# Patient Record
Sex: Female | Born: 1940 | Race: White | Hispanic: No | Marital: Married | Smoking: Never smoker
Health system: Southern US, Community
[De-identification: ages and names within clinical notes are randomized; demographics above are authoritative.]

## PROBLEM LIST (undated history)

## (undated) DIAGNOSIS — F329 Major depressive disorder, single episode, unspecified: Secondary | ICD-10-CM

## (undated) DIAGNOSIS — N3281 Overactive bladder: Secondary | ICD-10-CM

## (undated) DIAGNOSIS — Z7901 Long term (current) use of anticoagulants: Secondary | ICD-10-CM

## (undated) DIAGNOSIS — I428 Other cardiomyopathies: Secondary | ICD-10-CM

## (undated) DIAGNOSIS — G931 Anoxic brain damage, not elsewhere classified: Secondary | ICD-10-CM

## (undated) DIAGNOSIS — T502X5A Adverse effect of carbonic-anhydrase inhibitors, benzothiadiazides and other diuretics, initial encounter: Secondary | ICD-10-CM

## (undated) DIAGNOSIS — F0280 Dementia in other diseases classified elsewhere without behavioral disturbance: Secondary | ICD-10-CM

## (undated) DIAGNOSIS — N390 Urinary tract infection, site not specified: Secondary | ICD-10-CM

## (undated) DIAGNOSIS — I4901 Ventricular fibrillation: Secondary | ICD-10-CM

## (undated) DIAGNOSIS — E039 Hypothyroidism, unspecified: Secondary | ICD-10-CM

## (undated) DIAGNOSIS — E785 Hyperlipidemia, unspecified: Secondary | ICD-10-CM

## (undated) DIAGNOSIS — I5022 Chronic systolic (congestive) heart failure: Secondary | ICD-10-CM

## (undated) DIAGNOSIS — Z95810 Presence of automatic (implantable) cardiac defibrillator: Secondary | ICD-10-CM

## (undated) DIAGNOSIS — K625 Hemorrhage of anus and rectum: Secondary | ICD-10-CM

## (undated) DIAGNOSIS — I4891 Unspecified atrial fibrillation: Secondary | ICD-10-CM

## (undated) DIAGNOSIS — J309 Allergic rhinitis, unspecified: Secondary | ICD-10-CM

## (undated) DIAGNOSIS — L03115 Cellulitis of right lower limb: Secondary | ICD-10-CM

## (undated) DIAGNOSIS — G459 Transient cerebral ischemic attack, unspecified: Secondary | ICD-10-CM

## (undated) DIAGNOSIS — D62 Acute posthemorrhagic anemia: Secondary | ICD-10-CM

## (undated) DIAGNOSIS — K5900 Constipation, unspecified: Secondary | ICD-10-CM

## (undated) DIAGNOSIS — I340 Nonrheumatic mitral (valve) insufficiency: Secondary | ICD-10-CM

## (undated) DIAGNOSIS — G4700 Insomnia, unspecified: Secondary | ICD-10-CM

## (undated) DIAGNOSIS — N939 Abnormal uterine and vaginal bleeding, unspecified: Secondary | ICD-10-CM

## (undated) DIAGNOSIS — E669 Obesity, unspecified: Secondary | ICD-10-CM

## (undated) DIAGNOSIS — E46 Unspecified protein-calorie malnutrition: Secondary | ICD-10-CM

## (undated) DIAGNOSIS — E876 Hypokalemia: Secondary | ICD-10-CM

## (undated) DIAGNOSIS — I10 Essential (primary) hypertension: Secondary | ICD-10-CM

## (undated) DIAGNOSIS — R5381 Other malaise: Secondary | ICD-10-CM

## (undated) DIAGNOSIS — K220 Achalasia of cardia: Secondary | ICD-10-CM

## (undated) DIAGNOSIS — N184 Chronic kidney disease, stage 4 (severe): Secondary | ICD-10-CM

## (undated) DIAGNOSIS — G3183 Dementia with Lewy bodies: Secondary | ICD-10-CM

## (undated) HISTORY — DX: Hypokalemia: E87.6

## (undated) HISTORY — DX: Achalasia of cardia: K22.0

## (undated) HISTORY — DX: Urinary tract infection, site not specified: N39.0

## (undated) HISTORY — DX: Long term (current) use of anticoagulants: Z79.01

## (undated) HISTORY — DX: Unspecified atrial fibrillation: I48.91

## (undated) HISTORY — DX: Other cardiomyopathies: I42.8

## (undated) HISTORY — DX: Major depressive disorder, single episode, unspecified: F32.9

## (undated) HISTORY — DX: Essential (primary) hypertension: I10

## (undated) HISTORY — DX: Allergic rhinitis, unspecified: J30.9

## (undated) HISTORY — DX: Hyperlipidemia, unspecified: E78.5

## (undated) HISTORY — DX: Obesity, unspecified: E66.9

## (undated) HISTORY — PX: CYSTOSCOPY W/ STONE MANIPULATION: SHX1427

## (undated) HISTORY — DX: Dementia with Lewy bodies: G31.83

## (undated) HISTORY — DX: Abnormal uterine and vaginal bleeding, unspecified: N93.9

## (undated) HISTORY — DX: Other malaise: R53.81

## (undated) HISTORY — DX: Chronic systolic (congestive) heart failure: I50.22

## (undated) HISTORY — DX: Cellulitis of right lower limb: L03.115

## (undated) HISTORY — DX: Acute posthemorrhagic anemia: D62

## (undated) HISTORY — DX: Insomnia, unspecified: G47.00

## (undated) HISTORY — PX: CATARACT EXTRACTION, BILATERAL: SHX1313

## (undated) HISTORY — DX: Nonrheumatic mitral (valve) insufficiency: I34.0

## (undated) HISTORY — DX: Ventricular fibrillation: I49.01

## (undated) HISTORY — PX: LAPAROSCOPIC CHOLECYSTECTOMY: SUR755

## (undated) HISTORY — DX: Anoxic brain damage, not elsewhere classified: G93.1

## (undated) HISTORY — DX: Overactive bladder: N32.81

## (undated) HISTORY — DX: Hemorrhage of anus and rectum: K62.5

## (undated) HISTORY — DX: Dementia in other diseases classified elsewhere, unspecified severity, without behavioral disturbance, psychotic disturbance, mood disturbance, and anxiety: F02.80

## (undated) HISTORY — DX: Hypokalemia: T50.2X5A

## (undated) HISTORY — DX: Unspecified protein-calorie malnutrition: E46

## (undated) HISTORY — DX: Constipation, unspecified: K59.00

---

## 1994-04-25 DIAGNOSIS — G931 Anoxic brain damage, not elsewhere classified: Secondary | ICD-10-CM

## 1994-04-25 HISTORY — DX: Anoxic brain damage, not elsewhere classified: G93.1

## 1998-08-21 ENCOUNTER — Other Ambulatory Visit: Admission: RE | Admit: 1998-08-21 | Discharge: 1998-08-21 | Payer: Self-pay | Admitting: Family Medicine

## 2000-02-21 ENCOUNTER — Other Ambulatory Visit: Admission: RE | Admit: 2000-02-21 | Discharge: 2000-02-21 | Payer: Self-pay | Admitting: Family Medicine

## 2001-08-02 ENCOUNTER — Ambulatory Visit (HOSPITAL_COMMUNITY): Admission: RE | Admit: 2001-08-02 | Discharge: 2001-08-02 | Payer: Self-pay | Admitting: Internal Medicine

## 2001-08-02 HISTORY — PX: OTHER SURGICAL HISTORY: SHX169

## 2003-08-22 ENCOUNTER — Other Ambulatory Visit: Admission: RE | Admit: 2003-08-22 | Discharge: 2003-08-22 | Payer: Self-pay | Admitting: Family Medicine

## 2003-10-29 ENCOUNTER — Inpatient Hospital Stay (HOSPITAL_COMMUNITY): Admission: AD | Admit: 2003-10-29 | Discharge: 2003-10-31 | Payer: Self-pay | Admitting: Internal Medicine

## 2003-10-29 HISTORY — PX: OTHER SURGICAL HISTORY: SHX169

## 2004-02-25 ENCOUNTER — Ambulatory Visit: Payer: Self-pay | Admitting: Internal Medicine

## 2004-03-03 ENCOUNTER — Ambulatory Visit: Payer: Self-pay | Admitting: Internal Medicine

## 2004-03-09 ENCOUNTER — Ambulatory Visit (HOSPITAL_COMMUNITY): Admission: RE | Admit: 2004-03-09 | Discharge: 2004-03-09 | Payer: Self-pay | Admitting: Internal Medicine

## 2004-03-19 ENCOUNTER — Ambulatory Visit: Payer: Self-pay | Admitting: Internal Medicine

## 2004-03-22 ENCOUNTER — Ambulatory Visit: Payer: Self-pay | Admitting: Internal Medicine

## 2004-03-23 ENCOUNTER — Ambulatory Visit: Payer: Self-pay | Admitting: Infectious Diseases

## 2004-04-06 ENCOUNTER — Ambulatory Visit: Payer: Self-pay | Admitting: Infectious Diseases

## 2004-09-17 ENCOUNTER — Inpatient Hospital Stay (HOSPITAL_COMMUNITY): Admission: EM | Admit: 2004-09-17 | Discharge: 2004-09-21 | Payer: Self-pay | Admitting: *Deleted

## 2004-09-20 ENCOUNTER — Ambulatory Visit: Payer: Self-pay | Admitting: Cardiology

## 2004-09-24 ENCOUNTER — Ambulatory Visit: Payer: Self-pay | Admitting: Infectious Diseases

## 2004-09-24 ENCOUNTER — Inpatient Hospital Stay (HOSPITAL_COMMUNITY): Admission: AD | Admit: 2004-09-24 | Discharge: 2004-09-30 | Payer: Self-pay | Admitting: Internal Medicine

## 2004-09-24 HISTORY — PX: OTHER SURGICAL HISTORY: SHX169

## 2004-11-01 ENCOUNTER — Ambulatory Visit: Payer: Self-pay | Admitting: Cardiology

## 2004-11-12 ENCOUNTER — Ambulatory Visit: Payer: Self-pay | Admitting: Infectious Diseases

## 2004-12-01 ENCOUNTER — Ambulatory Visit: Payer: Self-pay | Admitting: Internal Medicine

## 2004-12-10 ENCOUNTER — Ambulatory Visit: Payer: Self-pay | Admitting: Infectious Diseases

## 2004-12-24 ENCOUNTER — Ambulatory Visit: Payer: Self-pay | Admitting: Internal Medicine

## 2004-12-30 ENCOUNTER — Ambulatory Visit: Payer: Self-pay | Admitting: Internal Medicine

## 2004-12-30 ENCOUNTER — Inpatient Hospital Stay (HOSPITAL_COMMUNITY): Admission: AD | Admit: 2004-12-30 | Discharge: 2004-12-30 | Payer: Self-pay | Admitting: Internal Medicine

## 2004-12-30 HISTORY — PX: OTHER SURGICAL HISTORY: SHX169

## 2005-02-09 ENCOUNTER — Ambulatory Visit: Payer: Self-pay | Admitting: Infectious Diseases

## 2005-03-04 ENCOUNTER — Ambulatory Visit: Payer: Self-pay | Admitting: Infectious Diseases

## 2005-04-08 ENCOUNTER — Ambulatory Visit (HOSPITAL_COMMUNITY): Admission: RE | Admit: 2005-04-08 | Discharge: 2005-04-08 | Payer: Self-pay | Admitting: Cardiology

## 2005-04-08 ENCOUNTER — Encounter: Payer: Self-pay | Admitting: Cardiology

## 2005-05-31 ENCOUNTER — Ambulatory Visit: Payer: Self-pay | Admitting: Internal Medicine

## 2006-08-30 ENCOUNTER — Encounter: Payer: Self-pay | Admitting: Vascular Surgery

## 2006-08-30 ENCOUNTER — Ambulatory Visit: Payer: Self-pay | Admitting: Vascular Surgery

## 2006-08-30 ENCOUNTER — Ambulatory Visit (HOSPITAL_COMMUNITY): Admission: RE | Admit: 2006-08-30 | Discharge: 2006-08-30 | Payer: Self-pay | Admitting: Family Medicine

## 2007-06-05 ENCOUNTER — Encounter: Admission: RE | Admit: 2007-06-05 | Discharge: 2007-07-19 | Payer: Self-pay | Admitting: Neurology

## 2007-12-12 ENCOUNTER — Encounter: Admission: RE | Admit: 2007-12-12 | Discharge: 2007-12-12 | Payer: Self-pay | Admitting: Cardiology

## 2008-12-20 DIAGNOSIS — I5023 Acute on chronic systolic (congestive) heart failure: Secondary | ICD-10-CM | POA: Insufficient documentation

## 2008-12-22 DIAGNOSIS — Z95810 Presence of automatic (implantable) cardiac defibrillator: Secondary | ICD-10-CM

## 2009-02-03 ENCOUNTER — Encounter: Admission: RE | Admit: 2009-02-03 | Discharge: 2009-02-03 | Payer: Self-pay | Admitting: Cardiology

## 2009-12-15 ENCOUNTER — Inpatient Hospital Stay (HOSPITAL_COMMUNITY): Admission: EM | Admit: 2009-12-15 | Discharge: 2009-12-22 | Payer: Self-pay | Admitting: Emergency Medicine

## 2009-12-15 ENCOUNTER — Ambulatory Visit: Payer: Self-pay | Admitting: Cardiology

## 2009-12-18 ENCOUNTER — Ambulatory Visit: Payer: Self-pay | Admitting: Vascular Surgery

## 2010-04-14 ENCOUNTER — Encounter (INDEPENDENT_AMBULATORY_CARE_PROVIDER_SITE_OTHER): Payer: Self-pay | Admitting: Cardiology

## 2010-04-14 ENCOUNTER — Inpatient Hospital Stay (HOSPITAL_COMMUNITY)
Admission: EM | Admit: 2010-04-14 | Discharge: 2010-04-17 | Payer: Self-pay | Source: Home / Self Care | Attending: Cardiology | Admitting: Cardiology

## 2010-04-14 HISTORY — PX: CARDIOVERSION: SHX1299

## 2010-04-15 ENCOUNTER — Encounter: Payer: Self-pay | Admitting: Internal Medicine

## 2010-05-10 ENCOUNTER — Ambulatory Visit
Admission: RE | Admit: 2010-05-10 | Discharge: 2010-05-10 | Payer: Self-pay | Source: Home / Self Care | Attending: Thoracic Surgery (Cardiothoracic Vascular Surgery) | Admitting: Thoracic Surgery (Cardiothoracic Vascular Surgery)

## 2010-06-08 ENCOUNTER — Ambulatory Visit
Admission: RE | Admit: 2010-06-08 | Discharge: 2010-06-08 | Disposition: A | Discharge status: Home / Self Care | Payer: Medicare Other | Source: Ambulatory Visit | Attending: Cardiology | Admitting: Cardiology

## 2010-06-08 ENCOUNTER — Other Ambulatory Visit: Payer: Self-pay | Admitting: Cardiology

## 2010-06-08 DIAGNOSIS — I4891 Unspecified atrial fibrillation: Secondary | ICD-10-CM

## 2010-06-10 NOTE — Consult Note (Signed)
Summary: [facility]   [facility]   Imported By: Roderic Ovens 05/27/2010 15:12:54  _____________________________________________________________________  External Attachment:    Type:   Image     Comment:   External Document

## 2010-06-14 ENCOUNTER — Inpatient Hospital Stay (HOSPITAL_BASED_OUTPATIENT_CLINIC_OR_DEPARTMENT_OTHER)
Admission: RE | Admit: 2010-06-14 | Discharge: 2010-06-14 | Disposition: A | Discharge status: Home / Self Care | Payer: Medicare Other | Source: Ambulatory Visit | Attending: Cardiology | Admitting: Cardiology

## 2010-06-14 DIAGNOSIS — I4891 Unspecified atrial fibrillation: Secondary | ICD-10-CM | POA: Insufficient documentation

## 2010-06-14 DIAGNOSIS — I2789 Other specified pulmonary heart diseases: Secondary | ICD-10-CM | POA: Insufficient documentation

## 2010-06-14 DIAGNOSIS — I428 Other cardiomyopathies: Secondary | ICD-10-CM | POA: Insufficient documentation

## 2010-06-14 DIAGNOSIS — I059 Rheumatic mitral valve disease, unspecified: Secondary | ICD-10-CM | POA: Insufficient documentation

## 2010-06-14 DIAGNOSIS — I509 Heart failure, unspecified: Secondary | ICD-10-CM | POA: Insufficient documentation

## 2010-06-14 LAB — POCT I-STAT 3, VENOUS BLOOD GAS (G3P V)
pCO2, Ven: 47.6 mmHg (ref 45.0–50.0)
pH, Ven: 7.349 — ABNORMAL HIGH (ref 7.250–7.300)
pO2, Ven: 37 mmHg (ref 30.0–45.0)

## 2010-06-14 LAB — POCT I-STAT 3, ART BLOOD GAS (G3+)
O2 Saturation: 96 %
TCO2: 30 mmol/L (ref 0–100)
pCO2 arterial: 43.2 mmHg (ref 35.0–45.0)
pO2, Arterial: 79 mmHg — ABNORMAL LOW (ref 80.0–100.0)

## 2010-06-28 ENCOUNTER — Encounter (INDEPENDENT_AMBULATORY_CARE_PROVIDER_SITE_OTHER): Payer: Medicare Other | Admitting: Thoracic Surgery (Cardiothoracic Vascular Surgery)

## 2010-06-28 DIAGNOSIS — I059 Rheumatic mitral valve disease, unspecified: Secondary | ICD-10-CM

## 2010-06-28 DIAGNOSIS — I4891 Unspecified atrial fibrillation: Secondary | ICD-10-CM

## 2010-06-29 ENCOUNTER — Emergency Department (HOSPITAL_COMMUNITY): Payer: Medicare Other

## 2010-06-29 ENCOUNTER — Emergency Department (HOSPITAL_COMMUNITY)
Admission: EM | Admit: 2010-06-29 | Discharge: 2010-06-29 | Disposition: A | Discharge status: Other Acute Inpatient Hospital | Payer: Medicare Other | Source: Home / Self Care | Attending: Emergency Medicine | Admitting: Emergency Medicine

## 2010-06-29 ENCOUNTER — Inpatient Hospital Stay (HOSPITAL_COMMUNITY)
Admission: AD | Admit: 2010-06-29 | Discharge: 2010-07-23 | DRG: 220 | Disposition: A | Discharge status: Rehabilitation Facility | Payer: Medicare Other | Source: Other Acute Inpatient Hospital | Attending: Thoracic Surgery (Cardiothoracic Vascular Surgery) | Admitting: Thoracic Surgery (Cardiothoracic Vascular Surgery)

## 2010-06-29 DIAGNOSIS — J309 Allergic rhinitis, unspecified: Secondary | ICD-10-CM | POA: Diagnosis present

## 2010-06-29 DIAGNOSIS — D6489 Other specified anemias: Secondary | ICD-10-CM | POA: Diagnosis not present

## 2010-06-29 DIAGNOSIS — R55 Syncope and collapse: Secondary | ICD-10-CM | POA: Insufficient documentation

## 2010-06-29 DIAGNOSIS — N390 Urinary tract infection, site not specified: Secondary | ICD-10-CM | POA: Diagnosis present

## 2010-06-29 DIAGNOSIS — N179 Acute kidney failure, unspecified: Secondary | ICD-10-CM | POA: Diagnosis not present

## 2010-06-29 DIAGNOSIS — I129 Hypertensive chronic kidney disease with stage 1 through stage 4 chronic kidney disease, or unspecified chronic kidney disease: Secondary | ICD-10-CM | POA: Diagnosis present

## 2010-06-29 DIAGNOSIS — D696 Thrombocytopenia, unspecified: Secondary | ICD-10-CM | POA: Diagnosis not present

## 2010-06-29 DIAGNOSIS — R5381 Other malaise: Secondary | ICD-10-CM | POA: Diagnosis not present

## 2010-06-29 DIAGNOSIS — N183 Chronic kidney disease, stage 3 unspecified: Secondary | ICD-10-CM | POA: Diagnosis present

## 2010-06-29 DIAGNOSIS — I5022 Chronic systolic (congestive) heart failure: Secondary | ICD-10-CM | POA: Diagnosis present

## 2010-06-29 DIAGNOSIS — E669 Obesity, unspecified: Secondary | ICD-10-CM | POA: Diagnosis present

## 2010-06-29 DIAGNOSIS — I509 Heart failure, unspecified: Secondary | ICD-10-CM | POA: Diagnosis present

## 2010-06-29 DIAGNOSIS — Z7901 Long term (current) use of anticoagulants: Secondary | ICD-10-CM | POA: Insufficient documentation

## 2010-06-29 DIAGNOSIS — Z8674 Personal history of sudden cardiac arrest: Secondary | ICD-10-CM

## 2010-06-29 DIAGNOSIS — I10 Essential (primary) hypertension: Secondary | ICD-10-CM | POA: Insufficient documentation

## 2010-06-29 DIAGNOSIS — D689 Coagulation defect, unspecified: Secondary | ICD-10-CM | POA: Diagnosis not present

## 2010-06-29 DIAGNOSIS — I059 Rheumatic mitral valve disease, unspecified: Principal | ICD-10-CM | POA: Diagnosis present

## 2010-06-29 DIAGNOSIS — A498 Other bacterial infections of unspecified site: Secondary | ICD-10-CM | POA: Diagnosis present

## 2010-06-29 DIAGNOSIS — I82B29 Chronic embolism and thrombosis of unspecified subclavian vein: Secondary | ICD-10-CM | POA: Diagnosis present

## 2010-06-29 DIAGNOSIS — E785 Hyperlipidemia, unspecified: Secondary | ICD-10-CM | POA: Diagnosis present

## 2010-06-29 DIAGNOSIS — I4891 Unspecified atrial fibrillation: Secondary | ICD-10-CM | POA: Diagnosis present

## 2010-06-29 DIAGNOSIS — I428 Other cardiomyopathies: Secondary | ICD-10-CM | POA: Diagnosis present

## 2010-06-29 DIAGNOSIS — N3946 Mixed incontinence: Secondary | ICD-10-CM | POA: Diagnosis present

## 2010-06-29 DIAGNOSIS — I498 Other specified cardiac arrhythmias: Secondary | ICD-10-CM | POA: Diagnosis present

## 2010-06-29 LAB — COMPREHENSIVE METABOLIC PANEL
ALT: 12 U/L (ref 0–35)
Alkaline Phosphatase: 75 U/L (ref 39–117)
CO2: 27 mEq/L (ref 19–32)
Calcium: 9.3 mg/dL (ref 8.4–10.5)
Chloride: 98 mEq/L (ref 96–112)
GFR calc non Af Amer: 37 mL/min — ABNORMAL LOW (ref >60)
Glucose, Bld: 104 mg/dL — ABNORMAL HIGH (ref 70–99)
Sodium: 135 mEq/L (ref 135–145)
Total Bilirubin: 1.1 mg/dL (ref 0.3–1.2)

## 2010-06-29 LAB — DIFFERENTIAL
Basophils Relative: 1 % (ref 0–1)
Eosinophils Absolute: 0.1 10*3/uL (ref 0.0–0.7)
Monocytes Absolute: 0.8 10*3/uL (ref 0.1–1.0)
Monocytes Relative: 11 % (ref 3–12)
Neutro Abs: 4.8 10*3/uL (ref 1.7–7.7)

## 2010-06-29 LAB — TROPONIN I: Troponin I: 0.01 ng/mL (ref 0.00–0.06)

## 2010-06-29 LAB — CBC
Hemoglobin: 11.3 g/dL — ABNORMAL LOW (ref 12.0–15.0)
MCH: 29.4 pg (ref 26.0–34.0)
MCHC: 31.8 g/dL (ref 30.0–36.0)

## 2010-06-29 LAB — URINALYSIS, ROUTINE W REFLEX MICROSCOPIC
Bilirubin Urine: NEGATIVE
Ketones, ur: NEGATIVE mg/dL
Nitrite: NEGATIVE
Protein, ur: NEGATIVE mg/dL
Specific Gravity, Urine: 1.012 (ref 1.005–1.030)
Urobilinogen, UA: 0.2 mg/dL (ref 0.0–1.0)

## 2010-06-29 LAB — CK TOTAL AND CKMB (NOT AT ARMC): Relative Index: INVALID (ref 0.0–2.5)

## 2010-06-29 LAB — BRAIN NATRIURETIC PEPTIDE: Pro B Natriuretic peptide (BNP): 365 pg/mL — ABNORMAL HIGH (ref 0.0–100.0)

## 2010-06-29 LAB — PROTIME-INR
INR: 1.92 — ABNORMAL HIGH (ref 0.00–1.49)
Prothrombin Time: 22.1 seconds — ABNORMAL HIGH (ref 11.6–15.2)

## 2010-06-29 NOTE — Assessment & Plan Note (Signed)
OFFICE VISIT  MASHA, ORBACH DOB:  [birth year]                                        June 28, 2010 CHART #:  [id number]  HISTORY OF PRESENT ILLNESS:  This parental returns for further followup of mitral regurgitation.  She was originally seen in consultation on April 15, 2010, at which time, she was hospitalized at [facility].  A full consultation note was dictated at that time. She was last seen here in the office May 10, 2010.  Since then, she has again developed symptoms of worsening heart failure.  She was seen in followup by Dr. Donnie Aho and underwent left and right heart catheterization on June 15, 2010.  This revealed no sign of significant coronary artery disease.  There was severe mitral regurgitation.  There was severe left ventricular dysfunction with ejection fraction estimated 30-35% with global hypokinesis.  Pulmonary artery pressures measured 34/11 with pulmonary capillary wedge pressure 15.  There were V-waves consistent with severe mitral regurgitation. Central venous pressure was 5.  The patient has been seen back in followup by Dr. Donnie Aho on February 24.  She again seems stable overall, but she has been hospitalized on two occasions with rapid atrial fibrillation and worsening heart failure.  They return to discuss possibility of elective mitral valve repair and a Maze procedure.  REVIEW OF SYSTEMS:  Unchanged from previously.  Of note is the fact that the patient has limited physical mobility.  Her husband states that she is afraid to get up and walk.  Since her last hospital discharge, she has not had any further episodes of resting shortness of breath.  She has not had any further rapid atrial fibrillation or tachy palpitations. She has never had any chest pain.  PHYSICAL EXAMINATION:  General:  Notable for an obese female who appears of stated age in no acute distress.  Vital Signs:  Blood  pressure 161/83, pulse 66 and regular, oxygen saturation 98% on room air.  Chest: Auscultation of the chest reveals clear breath sounds which are symmetrical bilaterally.  No wheezes, rales, or rhonchi are noted. Cardiovascular:  Regular rate and rhythm.  There is a grade 3/6 systolic murmur heard best at the apex.  No diastolic murmurs are noted. Abdomen:  Obese but soft, nontender.  There is no obvious ascites. Extremities:  Warm and adequately perfused.  There is mild bilateral lower extremity edema.  Rectal and GU exams are both deferred.  The remainder of her physical exam is noncontributory.  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  The jet of regurgitation is eccentric and may be primarily related to the prolapse (type II dysfunction).  She has stable but severe left ventricular dysfunction with known dilated nonischemic cardiomyopathy.  She has remote history of out of hospital cardiac arrest more than 20 years ago at which time, she suffered a significant anoxic brain injury.  She has been stable since that time. 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  She still has retained lead in place from her previous defibrillators.  She has never had any sustained irregular heart rhythms other than her out of hospital arrest back in the 90s. She has very limited physical mobility and this is particularly worrisome.  This is stable and chronic but according to her husband, it may be getting somewhat worse.  PLAN:  I have discussed matters  at length with the patient and her husband.  The rationale for surgical intervention has been compared and contrasted with continued conservative therapy.  On the one hand, surgery will be fairly high-risk.  On the other hand, if her condition were to deteriorate any further, she would clearly not be a candidate for intervention of any type.  All their questions have been addressed. They seem interested in proceeding with surgery.  I think it would be helpful to find out exactly what her physical limitations are to anticipate how this might impact her recovery even in the absence of any significant surgical complications.  We may need to get CT angiogram of the chest and further evaluate her venous anatomy and confirm whether or not her innominate vein is chronically occluded and/or what other venous drainage channels exists from the upper extremities.  Finally, we will need to discuss whether or not placement of epicardial patches for a defibrillator would be indicated or appropriate at time of surgery. This would obviously require a median sternotomy.  We will send the patient for a formal physical therapy consul this week to establish what her physical limitations are at this point in time.  We will plan to see her next week and make further plans as appropriate.  Salvatore Decent. Cornelius Moras, M.D. Electronically Signed  CHO/MEDQ  D:  06/28/2010  T:  06/29/2010  Job:  [id number]  cc:   Georga Hacking, M.D. Dario Guardian, M.D.

## 2010-06-30 ENCOUNTER — Inpatient Hospital Stay (HOSPITAL_COMMUNITY): Payer: Medicare Other

## 2010-06-30 DIAGNOSIS — I059 Rheumatic mitral valve disease, unspecified: Secondary | ICD-10-CM

## 2010-06-30 LAB — PROTIME-INR
INR: 2.03 — ABNORMAL HIGH (ref 0.00–1.49)
Prothrombin Time: 23.1 seconds — ABNORMAL HIGH (ref 11.6–15.2)

## 2010-07-01 ENCOUNTER — Inpatient Hospital Stay (HOSPITAL_COMMUNITY): Payer: Medicare Other

## 2010-07-01 DIAGNOSIS — I2510 Atherosclerotic heart disease of native coronary artery without angina pectoris: Secondary | ICD-10-CM

## 2010-07-01 DIAGNOSIS — Z01810 Encounter for preprocedural cardiovascular examination: Secondary | ICD-10-CM

## 2010-07-01 DIAGNOSIS — I059 Rheumatic mitral valve disease, unspecified: Secondary | ICD-10-CM

## 2010-07-01 DIAGNOSIS — R55 Syncope and collapse: Secondary | ICD-10-CM

## 2010-07-01 LAB — URINE CULTURE

## 2010-07-01 LAB — PROTIME-INR: Prothrombin Time: 27.5 seconds — ABNORMAL HIGH (ref 11.6–15.2)

## 2010-07-01 MED ORDER — IOHEXOL 300 MG/ML  SOLN
80.0000 mL | Freq: Once | INTRAMUSCULAR | Status: AC | PRN
  Administered 2010-07-01: 80 mL via INTRAVENOUS

## 2010-07-02 DIAGNOSIS — I059 Rheumatic mitral valve disease, unspecified: Secondary | ICD-10-CM

## 2010-07-02 LAB — PROTIME-INR
INR: 2.52 — ABNORMAL HIGH (ref 0.00–1.49)
Prothrombin Time: 27.3 seconds — ABNORMAL HIGH (ref 11.6–15.2)

## 2010-07-03 DIAGNOSIS — I498 Other specified cardiac arrhythmias: Secondary | ICD-10-CM

## 2010-07-03 LAB — BASIC METABOLIC PANEL
CO2: 28 mEq/L (ref 19–32)
Calcium: 9.3 mg/dL (ref 8.4–10.5)
Chloride: 102 mEq/L (ref 96–112)
Creatinine, Ser: 1.73 mg/dL — ABNORMAL HIGH (ref 0.4–1.2)
Glucose, Bld: 98 mg/dL (ref 70–99)
Sodium: 139 mEq/L (ref 135–145)

## 2010-07-04 LAB — BASIC METABOLIC PANEL
CO2: 27 mEq/L (ref 19–32)
Calcium: 9.1 mg/dL (ref 8.4–10.5)
Chloride: 103 mEq/L (ref 96–112)
GFR calc Af Amer: 32 mL/min — ABNORMAL LOW (ref >60)
Glucose, Bld: 90 mg/dL (ref 70–99)
Sodium: 139 mEq/L (ref 135–145)

## 2010-07-04 LAB — CBC
HCT: 35.1 % — ABNORMAL LOW (ref 36.0–46.0)
Hemoglobin: 11.1 g/dL — ABNORMAL LOW (ref 12.0–15.0)
MCH: 29 pg (ref 26.0–34.0)
MCHC: 31.6 g/dL (ref 30.0–36.0)
MCV: 91.6 fL (ref 78.0–100.0)
Platelets: 213 10*3/uL (ref 150–400)
RBC: 3.83 MIL/uL — ABNORMAL LOW (ref 3.87–5.11)
RDW: 15.3 % (ref 11.5–15.5)
WBC: 5.1 10*3/uL (ref 4.0–10.5)

## 2010-07-05 ENCOUNTER — Encounter: Payer: Medicare Other | Admitting: Thoracic Surgery (Cardiothoracic Vascular Surgery)

## 2010-07-05 LAB — BASIC METABOLIC PANEL
BUN: 31 mg/dL — ABNORMAL HIGH (ref 6–23)
BUN: 25 mg/dL — ABNORMAL HIGH (ref 6–23)
BUN: 32 mg/dL — ABNORMAL HIGH (ref 6–23)
CO2: 22 mEq/L (ref 19–32)
CO2: 23 mEq/L (ref 19–32)
CO2: 27 mEq/L (ref 19–32)
Calcium: 8.9 mg/dL (ref 8.4–10.5)
Chloride: 106 mEq/L (ref 96–112)
Chloride: 107 mEq/L (ref 96–112)
Chloride: 112 mEq/L (ref 96–112)
Creatinine, Ser: 1.5 mg/dL — ABNORMAL HIGH (ref 0.4–1.2)
Creatinine, Ser: 1.35 mg/dL — ABNORMAL HIGH (ref 0.4–1.2)
Creatinine, Ser: 1.58 mg/dL — ABNORMAL HIGH (ref 0.4–1.2)
GFR calc Af Amer: 47 mL/min — ABNORMAL LOW (ref >60)
GFR calc non Af Amer: 34 mL/min — ABNORMAL LOW (ref >60)
Glucose, Bld: 88 mg/dL (ref 70–99)
Glucose, Bld: 105 mg/dL — ABNORMAL HIGH (ref 70–99)
Potassium: 4.7 mEq/L (ref 3.5–5.1)
Potassium: 3.7 mEq/L (ref 3.5–5.1)

## 2010-07-05 LAB — DIFFERENTIAL
Eosinophils Absolute: 0.1 10*3/uL (ref 0.0–0.7)
Eosinophils Relative: 1 % (ref 0–5)
Lymphs Abs: 1.6 10*3/uL (ref 0.7–4.0)
Monocytes Relative: 5 % (ref 3–12)

## 2010-07-05 LAB — CBC
HCT: 30.6 % — ABNORMAL LOW (ref 36.0–46.0)
MCH: 28.4 pg (ref 26.0–34.0)
MCH: 28.7 pg (ref 26.0–34.0)
MCH: 28.7 pg (ref 26.0–34.0)
MCH: 29 pg (ref 26.0–34.0)
MCHC: 31.5 g/dL (ref 30.0–36.0)
MCHC: 31.7 g/dL (ref 30.0–36.0)
MCV: 89.7 fL (ref 78.0–100.0)
MCV: 91.2 fL (ref 78.0–100.0)
MCV: 91.9 fL (ref 78.0–100.0)
Platelets: 189 10*3/uL (ref 150–400)
Platelets: 208 10*3/uL (ref 150–400)
Platelets: 257 10*3/uL (ref 150–400)
RBC: 3.28 MIL/uL — ABNORMAL LOW (ref 3.87–5.11)
RDW: 13.8 % (ref 11.5–15.5)
RDW: 13.9 % (ref 11.5–15.5)
WBC: 12.1 10*3/uL — ABNORMAL HIGH (ref 4.0–10.5)

## 2010-07-05 LAB — URINALYSIS, ROUTINE W REFLEX MICROSCOPIC
Bilirubin Urine: NEGATIVE
Glucose, UA: NEGATIVE mg/dL
Nitrite: POSITIVE — AB
Specific Gravity, Urine: 1.022 (ref 1.005–1.030)
pH: 5 (ref 5.0–8.0)

## 2010-07-05 LAB — APTT: aPTT: 50 seconds — ABNORMAL HIGH (ref 24–37)

## 2010-07-05 LAB — MRSA PCR SCREENING: MRSA by PCR: NEGATIVE

## 2010-07-05 LAB — URINE CULTURE: Culture  Setup Time: 201112221527

## 2010-07-05 LAB — URINE MICROSCOPIC-ADD ON

## 2010-07-05 LAB — HEPARIN LEVEL (UNFRACTIONATED)
Heparin Unfractionated: 0.35 IU/mL (ref 0.30–0.70)
Heparin Unfractionated: 0.39 IU/mL (ref 0.30–0.70)

## 2010-07-05 LAB — PROTIME-INR: INR: 2.38 — ABNORMAL HIGH (ref 0.00–1.49)

## 2010-07-06 DIAGNOSIS — I059 Rheumatic mitral valve disease, unspecified: Secondary | ICD-10-CM

## 2010-07-06 LAB — BASIC METABOLIC PANEL
BUN: 23 mg/dL (ref 6–23)
Chloride: 105 mEq/L (ref 96–112)
Glucose, Bld: 87 mg/dL (ref 70–99)
Potassium: 4.5 mEq/L (ref 3.5–5.1)

## 2010-07-06 LAB — PROTIME-INR: INR: 1.85 — ABNORMAL HIGH (ref 0.00–1.49)

## 2010-07-07 LAB — PROTIME-INR: Prothrombin Time: 19.5 seconds — ABNORMAL HIGH (ref 11.6–15.2)

## 2010-07-08 ENCOUNTER — Inpatient Hospital Stay (HOSPITAL_COMMUNITY): Payer: Medicare Other

## 2010-07-08 LAB — TYPE AND SCREEN
ABO/RH(D): A POS
Antibody Screen: NEGATIVE

## 2010-07-08 LAB — CBC
HCT: 22.1 % — ABNORMAL LOW (ref 36.0–46.0)
HCT: 22.3 % — ABNORMAL LOW (ref 36.0–46.0)
HCT: 24.3 % — ABNORMAL LOW (ref 36.0–46.0)
HCT: 28.1 % — ABNORMAL LOW (ref 36.0–46.0)
HCT: 31 % — ABNORMAL LOW (ref 36.0–46.0)
HCT: 33.5 % — ABNORMAL LOW (ref 36.0–46.0)
HCT: 35.8 % — ABNORMAL LOW (ref 36.0–46.0)
Hemoglobin: 10.5 g/dL — ABNORMAL LOW (ref 12.0–15.0)
Hemoglobin: 11 g/dL — ABNORMAL LOW (ref 12.0–15.0)
Hemoglobin: 11.4 g/dL — ABNORMAL LOW (ref 12.0–15.0)
Hemoglobin: 7 g/dL — ABNORMAL LOW (ref 12.0–15.0)
Hemoglobin: 7.1 g/dL — ABNORMAL LOW (ref 12.0–15.0)
Hemoglobin: 7.5 g/dL — ABNORMAL LOW (ref 12.0–15.0)
Hemoglobin: 7.7 g/dL — ABNORMAL LOW (ref 12.0–15.0)
Hemoglobin: 8 g/dL — ABNORMAL LOW (ref 12.0–15.0)
Hemoglobin: 8.1 g/dL — ABNORMAL LOW (ref 12.0–15.0)
Hemoglobin: 9.1 g/dL — ABNORMAL LOW (ref 12.0–15.0)
Hemoglobin: 9.8 g/dL — ABNORMAL LOW (ref 12.0–15.0)
Hemoglobin: 9.8 g/dL — ABNORMAL LOW (ref 12.0–15.0)
MCH: 27.8 pg (ref 26.0–34.0)
MCH: 27.8 pg (ref 26.0–34.0)
MCH: 27.9 pg (ref 26.0–34.0)
MCH: 28 pg (ref 26.0–34.0)
MCH: 28.2 pg (ref 26.0–34.0)
MCH: 28.3 pg (ref 26.0–34.0)
MCH: 28.6 pg (ref 26.0–34.0)
MCH: 28.8 pg (ref 26.0–34.0)
MCH: 29 pg (ref 26.0–34.0)
MCHC: 31.6 g/dL (ref 30.0–36.0)
MCHC: 31.7 g/dL (ref 30.0–36.0)
MCHC: 31.7 g/dL (ref 30.0–36.0)
MCHC: 31.7 g/dL (ref 30.0–36.0)
MCHC: 31.8 g/dL (ref 30.0–36.0)
MCHC: 31.8 g/dL (ref 30.0–36.0)
MCHC: 32.4 g/dL (ref 30.0–36.0)
MCHC: 32.4 g/dL (ref 30.0–36.0)
MCV: 85.8 fL (ref 78.0–100.0)
MCV: 86 fL (ref 78.0–100.0)
MCV: 87.2 fL (ref 78.0–100.0)
MCV: 87.8 fL (ref 78.0–100.0)
MCV: 88 fL (ref 78.0–100.0)
MCV: 88.6 fL (ref 78.0–100.0)
MCV: 89.9 fL (ref 78.0–100.0)
Platelets: 168 10*3/uL (ref 150–400)
Platelets: 170 10*3/uL (ref 150–400)
Platelets: 182 10*3/uL (ref 150–400)
Platelets: 187 10*3/uL (ref 150–400)
Platelets: 188 10*3/uL (ref 150–400)
Platelets: 197 10*3/uL (ref 150–400)
RBC: 2.59 MIL/uL — ABNORMAL LOW (ref 3.87–5.11)
RBC: 2.79 MIL/uL — ABNORMAL LOW (ref 3.87–5.11)
RBC: 3.04 MIL/uL — ABNORMAL LOW (ref 3.87–5.11)
RBC: 3.17 MIL/uL — ABNORMAL LOW (ref 3.87–5.11)
RBC: 3.4 MIL/uL — ABNORMAL LOW (ref 3.87–5.11)
RBC: 3.46 MIL/uL — ABNORMAL LOW (ref 3.87–5.11)
RBC: 3.78 MIL/uL — ABNORMAL LOW (ref 3.87–5.11)
RBC: 3.87 MIL/uL (ref 3.87–5.11)
RBC: 4.05 MIL/uL (ref 3.87–5.11)
RDW: 14.5 % (ref 11.5–15.5)
RDW: 14.6 % (ref 11.5–15.5)
RDW: 14.7 % (ref 11.5–15.5)
RDW: 15 % (ref 11.5–15.5)
RDW: 15.3 % (ref 11.5–15.5)
WBC: 4 10*3/uL (ref 4.0–10.5)
WBC: 5.9 10*3/uL (ref 4.0–10.5)
WBC: 6.7 10*3/uL (ref 4.0–10.5)
WBC: 6.9 10*3/uL (ref 4.0–10.5)
WBC: 7.5 10*3/uL (ref 4.0–10.5)
WBC: 7.5 10*3/uL (ref 4.0–10.5)
WBC: 7.9 10*3/uL (ref 4.0–10.5)

## 2010-07-08 LAB — BLOOD GAS, ARTERIAL
Drawn by: 205171
FIO2: 0.21 %
Patient temperature: 98.6
TCO2: 23 mmol/L (ref 0–100)
pCO2 arterial: 32.1 mmHg — ABNORMAL LOW (ref 35.0–45.0)
pH, Arterial: 7.45 — ABNORMAL HIGH (ref 7.350–7.400)

## 2010-07-08 LAB — BASIC METABOLIC PANEL
BUN: 15 mg/dL (ref 6–23)
BUN: 17 mg/dL (ref 6–23)
BUN: 17 mg/dL (ref 6–23)
CO2: 25 mEq/L (ref 19–32)
CO2: 25 mEq/L (ref 19–32)
CO2: 27 mEq/L (ref 19–32)
CO2: 28 mEq/L (ref 19–32)
CO2: 29 mEq/L (ref 19–32)
CO2: 29 mEq/L (ref 19–32)
CO2: 29 mEq/L (ref 19–32)
Calcium: 8 mg/dL — ABNORMAL LOW (ref 8.4–10.5)
Calcium: 8.6 mg/dL (ref 8.4–10.5)
Calcium: 8.7 mg/dL (ref 8.4–10.5)
Calcium: 8.8 mg/dL (ref 8.4–10.5)
Chloride: 104 mEq/L (ref 96–112)
Chloride: 107 mEq/L (ref 96–112)
Chloride: 107 mEq/L (ref 96–112)
Chloride: 110 mEq/L (ref 96–112)
Creatinine, Ser: 0.93 mg/dL (ref 0.4–1.2)
Creatinine, Ser: 1 mg/dL (ref 0.4–1.2)
Creatinine, Ser: 1.15 mg/dL (ref 0.4–1.2)
Creatinine, Ser: 1.18 mg/dL (ref 0.4–1.2)
Creatinine, Ser: 1.42 mg/dL — ABNORMAL HIGH (ref 0.4–1.2)
GFR calc Af Amer: 43 mL/min — ABNORMAL LOW (ref >60)
GFR calc Af Amer: 44 mL/min — ABNORMAL LOW (ref >60)
GFR calc Af Amer: 49 mL/min — ABNORMAL LOW (ref >60)
GFR calc Af Amer: 52 mL/min — ABNORMAL LOW (ref >60)
GFR calc Af Amer: 55 mL/min — ABNORMAL LOW (ref >60)
GFR calc Af Amer: 57 mL/min — ABNORMAL LOW (ref >60)
GFR calc non Af Amer: 35 mL/min — ABNORMAL LOW (ref >60)
GFR calc non Af Amer: 37 mL/min — ABNORMAL LOW (ref >60)
GFR calc non Af Amer: 41 mL/min — ABNORMAL LOW (ref >60)
GFR calc non Af Amer: 43 mL/min — ABNORMAL LOW (ref >60)
GFR calc non Af Amer: 47 mL/min — ABNORMAL LOW (ref >60)
Glucose, Bld: 103 mg/dL — ABNORMAL HIGH (ref 70–99)
Glucose, Bld: 109 mg/dL — ABNORMAL HIGH (ref 70–99)
Glucose, Bld: 115 mg/dL — ABNORMAL HIGH (ref 70–99)
Glucose, Bld: 120 mg/dL — ABNORMAL HIGH (ref 70–99)
Glucose, Bld: 160 mg/dL — ABNORMAL HIGH (ref 70–99)
Glucose, Bld: 85 mg/dL (ref 70–99)
Potassium: 3.7 mEq/L (ref 3.5–5.1)
Potassium: 3.9 mEq/L (ref 3.5–5.1)
Potassium: 4.2 mEq/L (ref 3.5–5.1)
Potassium: 4.6 mEq/L (ref 3.5–5.1)
Sodium: 138 mEq/L (ref 135–145)
Sodium: 138 mEq/L (ref 135–145)
Sodium: 139 mEq/L (ref 135–145)
Sodium: 140 mEq/L (ref 135–145)
Sodium: 140 mEq/L (ref 135–145)
Sodium: 140 mEq/L (ref 135–145)
Sodium: 142 mEq/L (ref 135–145)

## 2010-07-08 LAB — DIFFERENTIAL
Basophils Absolute: 0 10*3/uL (ref 0.0–0.1)
Basophils Relative: 0 % (ref 0–1)
Basophils Relative: 0 % (ref 0–1)
Basophils Relative: 0 % (ref 0–1)
Basophils Relative: 0 % (ref 0–1)
Basophils Relative: 1 % (ref 0–1)
Eosinophils Absolute: 0.2 10*3/uL (ref 0.0–0.7)
Eosinophils Absolute: 0.2 10*3/uL (ref 0.0–0.7)
Eosinophils Absolute: 0.2 10*3/uL (ref 0.0–0.7)
Eosinophils Absolute: 0.2 10*3/uL (ref 0.0–0.7)
Eosinophils Absolute: 0.2 10*3/uL (ref 0.0–0.7)
Eosinophils Relative: 3 % (ref 0–5)
Eosinophils Relative: 3 % (ref 0–5)
Eosinophils Relative: 3 % (ref 0–5)
Eosinophils Relative: 4 % (ref 0–5)
Lymphocytes Relative: 30 % (ref 12–46)
Lymphs Abs: 1.1 10*3/uL (ref 0.7–4.0)
Lymphs Abs: 1.6 10*3/uL (ref 0.7–4.0)
Lymphs Abs: 1.8 10*3/uL (ref 0.7–4.0)
Lymphs Abs: 1.8 10*3/uL (ref 0.7–4.0)
Lymphs Abs: 2 10*3/uL (ref 0.7–4.0)
Lymphs Abs: 2.1 10*3/uL (ref 0.7–4.0)
Monocytes Absolute: 0.6 10*3/uL (ref 0.1–1.0)
Monocytes Absolute: 0.6 10*3/uL (ref 0.1–1.0)
Monocytes Absolute: 0.7 10*3/uL (ref 0.1–1.0)
Monocytes Absolute: 0.7 10*3/uL (ref 0.1–1.0)
Monocytes Relative: 10 % (ref 3–12)
Monocytes Relative: 10 % (ref 3–12)
Monocytes Relative: 10 % (ref 3–12)
Monocytes Relative: 6 % (ref 3–12)
Monocytes Relative: 8 % (ref 3–12)
Monocytes Relative: 8 % (ref 3–12)
Neutro Abs: 3.9 10*3/uL (ref 1.7–7.7)
Neutro Abs: 4 10*3/uL (ref 1.7–7.7)
Neutro Abs: 4.9 10*3/uL (ref 1.7–7.7)
Neutro Abs: 5.1 10*3/uL (ref 1.7–7.7)
Neutro Abs: 9.7 10*3/uL — ABNORMAL HIGH (ref 1.7–7.7)
Neutrophils Relative %: 57 % (ref 43–77)
Neutrophils Relative %: 68 % (ref 43–77)
Neutrophils Relative %: 83 % — ABNORMAL HIGH (ref 43–77)

## 2010-07-08 LAB — CROSSMATCH: Antibody Screen: NEGATIVE

## 2010-07-08 LAB — COMPREHENSIVE METABOLIC PANEL
AST: 28 U/L (ref 0–37)
AST: 32 U/L (ref 0–37)
Albumin: 3.2 g/dL — ABNORMAL LOW (ref 3.5–5.2)
BUN: 22 mg/dL (ref 6–23)
CO2: 29 mEq/L (ref 19–32)
Calcium: 8.9 mg/dL (ref 8.4–10.5)
Calcium: 9 mg/dL (ref 8.4–10.5)
Chloride: 100 mEq/L (ref 96–112)
Creatinine, Ser: 1.27 mg/dL — ABNORMAL HIGH (ref 0.4–1.2)
Creatinine, Ser: 1.44 mg/dL — ABNORMAL HIGH (ref 0.4–1.2)
GFR calc Af Amer: 44 mL/min — ABNORMAL LOW (ref >60)
GFR calc Af Amer: 50 mL/min — ABNORMAL LOW (ref >60)
GFR calc non Af Amer: 42 mL/min — ABNORMAL LOW (ref >60)
Glucose, Bld: 161 mg/dL — ABNORMAL HIGH (ref 70–99)
Total Bilirubin: 1.2 mg/dL (ref 0.3–1.2)
Total Protein: 7.3 g/dL (ref 6.0–8.3)

## 2010-07-08 LAB — PROTIME-INR
INR: 1.1 (ref 0.00–1.49)
INR: 1.27 (ref 0.00–1.49)
INR: 1.87 — ABNORMAL HIGH (ref 0.00–1.49)
INR: 2.53 — ABNORMAL HIGH (ref 0.00–1.49)
Prothrombin Time: 14.4 seconds (ref 11.6–15.2)
Prothrombin Time: 14.5 seconds (ref 11.6–15.2)
Prothrombin Time: 16.1 seconds — ABNORMAL HIGH (ref 11.6–15.2)
Prothrombin Time: 16.5 seconds — ABNORMAL HIGH (ref 11.6–15.2)
Prothrombin Time: 27.4 seconds — ABNORMAL HIGH (ref 11.6–15.2)

## 2010-07-08 LAB — LIPID PANEL
Cholesterol: 164 mg/dL (ref 0–200)
Triglycerides: 87 mg/dL (ref <150)

## 2010-07-08 LAB — APTT: aPTT: 27 seconds (ref 24–37)

## 2010-07-08 LAB — POCT CARDIAC MARKERS
CKMB, poc: <1 ng/mL — ABNORMAL LOW (ref 1.0–8.0)
Myoglobin, poc: 174 ng/mL (ref 12–200)
Troponin i, poc: <0.05 ng/mL (ref 0.00–0.09)

## 2010-07-08 LAB — HEPARIN LEVEL (UNFRACTIONATED): Heparin Unfractionated: 0.22 IU/mL — ABNORMAL LOW (ref 0.30–0.70)

## 2010-07-08 LAB — CARDIAC PANEL(CRET KIN+CKTOT+MB+TROPI)
CK, MB: 1.4 ng/mL (ref 0.3–4.0)
Relative Index: 1.2 (ref 0.0–2.5)
Total CK: 119 U/L (ref 7–177)

## 2010-07-08 LAB — POCT I-STAT 3, ART BLOOD GAS (G3+)
Acid-Base Excess: 2 mmol/L (ref 0.0–2.0)
pO2, Arterial: 85 mmHg (ref 80.0–100.0)

## 2010-07-08 LAB — POCT I-STAT 3, VENOUS BLOOD GAS (G3P V)
O2 Saturation: 67 %
pCO2, Ven: 44.5 mmHg — ABNORMAL LOW (ref 45.0–50.0)
pO2, Ven: 36 mmHg (ref 30.0–45.0)

## 2010-07-08 LAB — TROPONIN I: Troponin I: 0.08 ng/mL — ABNORMAL HIGH (ref 0.00–0.06)

## 2010-07-08 LAB — MRSA PCR SCREENING: MRSA by PCR: NEGATIVE

## 2010-07-08 LAB — MAGNESIUM: Magnesium: 2.2 mg/dL (ref 1.5–2.5)

## 2010-07-08 LAB — TSH: TSH: 0.938 u[IU]/mL (ref 0.350–4.500)

## 2010-07-08 LAB — BRAIN NATRIURETIC PEPTIDE: Pro B Natriuretic peptide (BNP): 64 pg/mL (ref 0.0–100.0)

## 2010-07-08 LAB — ABO/RH: ABO/RH(D): A POS

## 2010-07-09 ENCOUNTER — Inpatient Hospital Stay (HOSPITAL_COMMUNITY): Payer: Medicare Other

## 2010-07-10 LAB — CBC
Hemoglobin: 9.8 g/dL — ABNORMAL LOW (ref 12.0–15.0)
MCH: 28.7 pg (ref 26.0–34.0)
RBC: 3.41 MIL/uL — ABNORMAL LOW (ref 3.87–5.11)

## 2010-07-10 LAB — PROTIME-INR: Prothrombin Time: 14.9 seconds (ref 11.6–15.2)

## 2010-07-11 DIAGNOSIS — N390 Urinary tract infection, site not specified: Secondary | ICD-10-CM

## 2010-07-11 LAB — PROTIME-INR
INR: 1.18 (ref 0.00–1.49)
Prothrombin Time: 15.2 seconds (ref 11.6–15.2)

## 2010-07-11 LAB — URINE CULTURE
Culture  Setup Time: 201203140943
Special Requests: NEGATIVE

## 2010-07-12 LAB — CBC
Hemoglobin: 10.4 g/dL — ABNORMAL LOW (ref 12.0–15.0)
MCH: 29.3 pg (ref 26.0–34.0)
RBC: 3.55 MIL/uL — ABNORMAL LOW (ref 3.87–5.11)
WBC: 3.6 10*3/uL — ABNORMAL LOW (ref 4.0–10.5)

## 2010-07-12 LAB — PROTIME-INR
INR: 1.11 (ref 0.00–1.49)
Prothrombin Time: 14.5 seconds (ref 11.6–15.2)

## 2010-07-12 NOTE — Consult Note (Signed)
Margaret [name], Margaret [name]               ACCOUNT NO.:  [id number]  MEDICAL RECORD NO.:  [id number]           PATIENT TYPE:  I  LOCATION:  3709                         FACILITY:  [facility]  PHYSICIAN:  Missey Hasley C. Vernie Ammons, M.D.  DATE OF BIRTH:  1940/11/08  DATE OF CONSULTATION:  07/09/2010 DATE OF DISCHARGE:                                CONSULTATION   Margaret [name] is a 70 year old white female patient who was last seen by me in 2008.  She is seen today in hospital consultation for further evaluation and recommendations regarding recurrent UTIs.  She has a long history of chronic/recurrent urinary tract infections dating back to 2000.  She has been evaluated with upper tract studies as well as cystoscopy, which have been found to be negative for any abnormality. She is typically completely asymptomatic when she does have an infection and specifically has no dysuria or other changes in her voiding pattern. She does have a chronic history of urgency with urge incontinence as well as stress incontinence.  She has a history of having an implanted defibrillator that became infected in 2006 and require removal.  Dr. Cornelius Moras contacted me and wanted to know how to best prevent further urinary tract infections, which could possibly be serving as a source for infection of an implanted device in the future.  The patient currently reports not having noted any dysuria, hematuria, or flank pain.  She does have chronic urgency and urge incontinence as well as stress incontinence.  She is currently not taking any anticholinergic medications for this.  She does have urinary frequency, but has not had any fever.  When she was admitted to the hospital, she was found to have an E. coli UTI that was pansensitive consistent with a community-acquired E. coli UTI.  This was treated appropriately with antibiotics and her urine was recultured 2 days ago and found to be positive for Enterobacter with sensitivity patterns  pending at this time.  Of note, though the patient has been in the hospital since developing the second infection.  PAST MEDICAL HISTORY:  Positive for: 1. Hyperlipidemia. 2. Hypertension. 3. Achalasia. 4. Nonischemic cardiomyopathy. 5. History of cardiac arrest in the 1990s with resultant anoxic brain     injury. 6. Chronic kidney disease, stage III. 7. Mitral regurgitation.  SURGICAL HISTORY: 1. AICD implantation. 2. AICD explantation. 3. Generator replacement.  CURRENT MEDICATIONS:  Iron sulfate, potassium chloride, lisinopril, Claritin, Zocor, Flonase, calcium carbonate, Cipro, Lovenox, Tylenol, Benadryl.  ALLERGIES:  NKDA.  SOCIAL HISTORY:  She is disabled and denies alcohol or tobacco use.  She is currently married and living with her husband with several children.  FAMILY HISTORY:  Noncontributory.  REVIEW OF SYSTEMS:  She requires glasses and has joint pain secondary to arthritis.  She also has difficulty with memory and obesity.  Otherwise, her 13-point review of systems is negative other than as noted above.  PHYSICAL EXAMINATION:  VITAL SIGNS:  Temperature is 96.7, blood pressure 147/76, pulse 72, respiration 16. GENERAL:  The patient is well-developed, well-nourished, mildly obese female in no apparent distress. HEENT:  Atraumatic, normocephalic.  Oropharynx is clear. NECK:  Supple with midline trachea. CHEST:  Reveals normal respiratory effort. CARDIOVASCULAR:  Regular rate and rhythm with grade 3 systolic murmur. ABDOMEN:  Obese, soft and nontender without mass or HSM.  She had no CVAT. SKIN:  Warm and dry. EXTREMITIES:  Without clubbing, cyanosis or edema. NEUROLOGIC:  She has broad-based ataxic gait and has no other gross focal neurologic deficits, but does have difficulty with memory.  LABORATORY RESULTS:  Her urinalysis on admission on June 29, 2010, had 11-20 white blood cells, was nitrite negative, and was leukocyte esterase negative. Urine  culture on June 29, 2010, was positive for E. coli that was pansensitive. Urine culture on July 07, 2010, positive for Enterobacter with sensitivities pending.  Her creatinine is 1.44.  IMAGING STUDIES:  CT scans done in August 2011 as well as December 2011 and renal ultrasound done on July 09, 2010 were independently reviewed. All studies were essentially normal from urologic standpoint with normal- appearing kidneys.  There are some small bilateral renal cysts with no significance.  There is no evidence of hydronephrosis, masses, or stones.  The bladder is also noted to be normal with no lesions or abnormality.  IMPRESSION: 1. Bilateral renal cysts with no clinical significance. 2. Chronic urge and stress incontinence.  She would benefit from being     restarted on anticholinergic therapy.  I will order that. 3. Recurrent urinary tract infection.  She clearly had an infection     when she came into the hospital.  This was treated appropriately     with antibiotics and now has redeveloped another infection.  These     are completely asymptomatic.  There is no anatomic abnormality of     the GU system on imaging studies for physical examination.  She has     had low pulmonary vascular resistances documented in the past, but     a recheck of this would be helpful.  I would typically recommend     placing the patient on low-dose, nightly prophylaxis with something     like Macrodantin, but in this case, since the consequences of     another infection are so great (possible reinfection of an     implanted defibrillator), I would suggest getting input from     Infectious Disease as well.  In the meantime coverage with broad     spectrum antibiotic such as Cipro would be appropriate.  RECOMMENDATIONS: 1. Restart Detrol LA 4 mg daily. 2..  Check bladder scan, PVR prior to and after starting Detrol. 1. Continue broad-spectrum antibiotics and treat according     sensitivities. 2. Prior  to starting low-dose prophylactic antibiotics, I would     recommend an Infectious Disease consult in order to obtain their     input into this challenging case.     Maxon Kresse C. Vernie Ammons, M.D.     MCO/MEDQ  D:  07/09/2010  T:  07/10/2010  Job:  [id number]  cc:   Georga Hacking, M.D. Salvatore Decent. Cornelius Moras, M.D.  Electronically Signed by Ihor Gully M.D. on 07/12/2010 04:15:07 AM

## 2010-07-12 NOTE — Procedures (Signed)
  NAMESHALAINA, Margaret [name] NO.:  [id number]  MEDICAL RECORD NO.:  [id number]           PATIENT TYPE:  LOCATION:                                 FACILITY:  PHYSICIAN:  Georga Hacking, M.D.DATE OF BIRTH:  1940/08/22  DATE OF PROCEDURE:  06/15/2010                           CARDIAC CATHETERIZATION   HISTORY:  A 70 year old female with a previous cardiomyopathy and cardiac arrest, who has a significant mitral regurgitation.  She has had several hospitalizations with acute congestive heart failure and is also had atrial fibrillation.  She is currently suppressed on amiodarone.  PROCEDURE:  Right and left heart catheterization with coronary angiograms and left ventriculogram.  COMMENTS ABOUT PROCEDURE:  The patient was brought to the outpatient diagnostic center and prepped and draped in the usual manner.  After Xylocaine anesthesia, a 7-French sheath was placed in the right femoral vein percutaneously.  A Swan-Ganz catheter was placed into the central circulation to measure pressures and cardiac outputs were measured. Following this, the right femoral artery was entered using a single anterior needle wall stick and a 4-French sheath was placed.  Angiograms were made using 4-French catheters and a 30 mL ventriculogram was performed.  Sheath was then removed.  She tolerated the procedure well.  HEMODYNAMIC DATA:  This was done precontrast, right atrium A equals 5, V equals 3, mean equals 1.  Right ventricle 34/1-3.  Pulmonary artery 34/11.  Pulmonary capillary wedge pressure, A equals 15, V equals 25, mean equals 16; aorta 173/79, LV 174/ 20.  Aortic saturation 96%, pulmonary artery saturation 67%.  Cardiac output 5.3 liters per minute. Pulmonary artery pressures measured on pullback showed a PA pressure of 55/30 following the ventriculogram.  ANGIOGRAPHIC DATA:  Left ventriculogram:  Performed in the 30-degree RAO projection, the aortic valve is normal.  There  appeared to be severe mitral regurgitation noted with a dilated atrium.  The mitral regurgitation appeared to be 3-4+ angiographically.  Left ventriculogram showed dilated with moderate global hypokinesis noted.  The estimated ejection fraction is 30-35%.  Coronary arteries arise and distribute normally.  Coronary calcifications noted in the left coronary system. The left main coronary is normal.  The left anterior descending is calcified with mild irregularity.  The circumflex is calcified proximally with mild irregularity.  The right coronary artery is a dominant vessel and contains no significant stenosis.  The right coronary artery is also calcified and contained scattered irregularities.  IMPRESSION: 1. Severe mitral regurgitation. 2. Mild-to-moderate pulmonary hypertension with significant V-waves. 3. Coronary calcification noted, but no significant obstructive     stenoses are noted.  RECOMMENDATIONS:  Consideration of mitral valve repair, although risk will be increased.     Georga Hacking, M.D.     WST/MEDQ  D:  06/14/2010  T:  06/14/2010  Job:  [id number]  cc:   Salvatore Decent. Cornelius Moras, M.D. Dario Guardian, M.D.  Electronically Signed by Lacretia Nicks. Donnie Aho M.D. on 07/12/2010 04:59:57 PM

## 2010-07-12 NOTE — H&P (Signed)
NAME:  Margaret [name], Margaret [name]             ACCOUNT NO.:  [id number]  MEDICAL RECORD NO.:  [id number]          PATIENT TYPE:  LOCATION:                                 FACILITY:  PHYSICIAN:  Georga Hacking, M.D.DATE OF BIRTH:  [birth year]  DATE OF ADMISSION:  06/29/2010                             HISTORY & PHYSICAL   REASON FOR ADMISSION:  Syncope.  HISTORY:  The patient is a 70 year old female with a complicated past medical history.  She has a history of a cardiac arrest and was out of the hospital due to a nonischemic cardiomyopathy back in the 1990s.  She made a remarkable recovery from , but had residual short-term memory loss.  She had an implantable defibrillator, and in 2003, had a generator replacement, later had repositioning with a defibrillator and later and then had erosion of the defibrillator in 2006.  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 The family refused to have an open thoracotomy for placement of patches and since then she has carried around AED when she is out in public. She has gotten along fairly well, but had difficulty with gait disturbance and some short-term memory loss.  She was found to have moderate-to-severe mitral regurgitation on October 2010, and we had opted to leave that in place.  She developed severe acute dyspnea and had some congestive heart failure in August.  Cardiac catheterization at that time showed significant mitral regurgitation with V-waves and at that time she had a fairly large hematoma that developed and following the cath the next day and so we opted to treat her conservatively.  She was admitted with rapid atrial fibrillation, shortness of breath in December, and converted to sinus rhythm, and was placed on amiodarone at the time.  At that time, because of recurrent atrial fibrillation as well as mitral regurgitation, she  was seen in consultation by Dr. Cornelius Moras and we felt that she might consider mitral valve repair. Catheterization was done recently in February in preparation for potential mitral valve repair.  It shows severe mitral regurgitation, big V-waves, and an ejection fraction of about 30% to 35%.  She saw Dr. Cornelius Moras yesterday and was due to have a physical therapy evaluation.  She today with standing up doing some laundry with her husband and then began to feel somewhat weak and then had a syncopal episode.  Her husband reports that she was out for about 3 minutes and then when she awoke she began to vomit and was assisted to the bathroom by a neighbor, they did not hookup the AED at the time, and she was transported here to the emergency room.  Evaluation here has been unremarkable with an EKG showing a left bundle-branch block pattern and lab been unremarkable. She was brought by the ambulance to the [facility] Emergency Room is transferred to [facility] this time for further evaluation.  PAST MEDICAL HISTORY:  Remarkable for; 1. Hyperlipidemia. 2. Hypertension. 3. Achalasia treated with Botox.  She has had a prior vegetation on     the  tricuspid valve treated in the past at the time of her AICD     explant.  PAST SURGICAL HISTORY: 1. AICD explant. 2. Generator replacement. 3. Implantation of an AICD previously.  ALLERGIES:  None.  CURRENT MEDICATIONS: 1. Lisinopril 40 mg daily. 2. Warfarin therapy. 3. Carvedilol 3.125 mg daily. 4. Flonase. 5. She is also on amiodarone 200 mg daily. 6. Lasix 40 mg daily.  SOCIAL HISTORY:  She is disabled, formally worked of daily questions, closely Environmental health practitioner, does not use alcohol or smoke.  She is married and currently lives with her husband.  She has several children.  FAMILY HISTORY:  Family history is recorded in old records and is noncontributory.  REVIEW OF SYSTEMS:  Obese for several years.  She is able to walk, but has  moderate dyspnea with exertion.  She has had ataxia previously.  She has had previous cataract extraction and wears glasses.  No skin problems.  Normally, no dyspnea at rest, but does have dyspnea with exertion.  She has not see incontinence and frequency.  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  The remainder of systems unremarkable.  PHYSICAL EXAMINATION:  GENERAL:  She is a pleasant elderly obese female, in no acute distress. VITAL SIGNS:  Blood pressure is currently 140/70, pulse is currently 72 and regular. SKIN:  Warm and dry, somewhat rosy cheeks. ENT:  EOMI.  PERRLA.  C and S clear.  Fundi not examined.  Pharynx negative. NECK:  Supple without masses.  There is no JVD, thyromegaly, or carotid bruits. LUNGS:  Clear to auscultation and percussion bilaterally. CARDIAC:  Shows regular rhythm.  Normal S1 and S2.  There is a 2-3/6 holosystolic murmur at the apex radiating someone widely over the precordium. ABDOMEN:  Soft and nontender. EXTREMITIES:  Previous catheterization site is well-healed that she has trace peripheral edema and mild venous varicosities noted.  Peripheral pulses were present are 2+. NEUROLOGIC:  Shows normal cranial nerves, sensory, and motor intact. Memory appears to be grossly intact today.  Some difficulty with short- term recall.  LABORATORY DATA:  EKG shows left ventricular hypertrophy with ST and T- wave changes, normal sinus rhythm, and QTc is 0.45.  Her creatinine is 1.41, hemoglobin 11.3, and hematocrit is 35.5.  Liver enzymes were normal.  She has 11-20 wbc's noted.  IMPRESSION: 1. Syncope in the patient with cardiomyopathy and a previous out of     hospital cardiac arrest.  This could have been a vagal episode, but     she is very worsening for recurrent arrhythmias. 2. Severe mitral regurgitation contemplating surgery. 3.  Nonischemic cardiomyopathy. 4. Hypertensive heart disease. 5. Hyperlipidemia under treatment. 6. Previous anoxic encephalopathy with short-term memory loss. 7. Pyuria. 8. Chronic kidney disease stage III. 9. Amiodarone treatment for atrial fibrillation, paroxysmal.  RECOMMENDATIONS:  The patient will be admitted to telemetry for further monitoring.  We will obtain a physical therapy consultation per Dr. Orvan July assessment.  I would also like to get electrophysiology involvement at this time.  Their questions has been raised as to whether she should have a median sternotomy approach with placement of patches on her heart at the time of mitral valve repair.  The patient and her husband had previously refused this approach for just a placement of a defibrillator alone.  At this point in time, I will obtain an EP consultation, but the recent episode of  syncope makes this something very worsen.  Check urine culture.  Check for evidence of heart failure and monitor for arrhythmias.  Check serial enzymes, although she did not have significant coronary artery disease previously.     Georga Hacking, M.D.     WST/MEDQ  D:  06/29/2010  T:  06/29/2010  Job:  [id number]  cc:   Dario Guardian, M.D.  Electronically Signed by Lacretia Nicks. Donnie Aho M.D. on 07/12/2010 05:00:08 PM

## 2010-07-13 LAB — CBC
Hemoglobin: 9.9 g/dL — ABNORMAL LOW (ref 12.0–15.0)
RBC: 3.32 MIL/uL — ABNORMAL LOW (ref 3.87–5.11)
WBC: 2.5 10*3/uL — ABNORMAL LOW (ref 4.0–10.5)

## 2010-07-13 LAB — PROTIME-INR
INR: 1.1 (ref 0.00–1.49)
Prothrombin Time: 14.4 seconds (ref 11.6–15.2)

## 2010-07-14 LAB — URINALYSIS, ROUTINE W REFLEX MICROSCOPIC
Bilirubin Urine: NEGATIVE
Ketones, ur: NEGATIVE mg/dL
Leukocytes, UA: NEGATIVE
Nitrite: NEGATIVE
Specific Gravity, Urine: 1.013 (ref 1.005–1.030)
Urobilinogen, UA: 0.2 mg/dL (ref 0.0–1.0)
pH: 5.5 (ref 5.0–8.0)

## 2010-07-14 LAB — BASIC METABOLIC PANEL
CO2: 26 mEq/L (ref 19–32)
Chloride: 108 mEq/L (ref 96–112)
Creatinine, Ser: 1.47 mg/dL — ABNORMAL HIGH (ref 0.4–1.2)
GFR calc Af Amer: 43 mL/min — ABNORMAL LOW (ref >60)
Potassium: 4.3 mEq/L (ref 3.5–5.1)
Sodium: 139 mEq/L (ref 135–145)

## 2010-07-14 LAB — CBC
Hemoglobin: 9.5 g/dL — ABNORMAL LOW (ref 12.0–15.0)
MCH: 29.1 pg (ref 26.0–34.0)
Platelets: 172 10*3/uL (ref 150–400)
RBC: 3.27 MIL/uL — ABNORMAL LOW (ref 3.87–5.11)
WBC: 3.6 10*3/uL — ABNORMAL LOW (ref 4.0–10.5)

## 2010-07-14 LAB — PROTIME-INR
INR: 1.12 (ref 0.00–1.49)
Prothrombin Time: 14.6 seconds (ref 11.6–15.2)

## 2010-07-14 LAB — URINE MICROSCOPIC-ADD ON

## 2010-07-15 ENCOUNTER — Inpatient Hospital Stay (HOSPITAL_COMMUNITY): Payer: Medicare Other

## 2010-07-15 DIAGNOSIS — I4901 Ventricular fibrillation: Secondary | ICD-10-CM

## 2010-07-15 DIAGNOSIS — I495 Sick sinus syndrome: Secondary | ICD-10-CM

## 2010-07-15 DIAGNOSIS — I4891 Unspecified atrial fibrillation: Secondary | ICD-10-CM

## 2010-07-15 DIAGNOSIS — I428 Other cardiomyopathies: Secondary | ICD-10-CM

## 2010-07-15 DIAGNOSIS — I059 Rheumatic mitral valve disease, unspecified: Secondary | ICD-10-CM

## 2010-07-15 DIAGNOSIS — I509 Heart failure, unspecified: Secondary | ICD-10-CM

## 2010-07-15 LAB — APTT
aPTT: 31 seconds (ref 24–37)
aPTT: 40 seconds — ABNORMAL HIGH (ref 24–37)

## 2010-07-15 LAB — CBC
HCT: 34.7 % — ABNORMAL LOW (ref 36.0–46.0)
HCT: 27.2 % — ABNORMAL LOW (ref 36.0–46.0)
HCT: 23.5 % — ABNORMAL LOW (ref 36.0–46.0)
Hemoglobin: 11.2 g/dL — ABNORMAL LOW (ref 12.0–15.0)
Hemoglobin: 9.5 g/dL — ABNORMAL LOW (ref 12.0–15.0)
Hemoglobin: 7.9 g/dL — ABNORMAL LOW (ref 12.0–15.0)
MCH: 30 pg (ref 26.0–34.0)
MCHC: 32.3 g/dL (ref 30.0–36.0)
MCV: 86.7 fL (ref 78.0–100.0)
RBC: 2.71 MIL/uL — ABNORMAL LOW (ref 3.87–5.11)
RDW: 15.1 % (ref 11.5–15.5)
WBC: 6.3 10*3/uL (ref 4.0–10.5)
WBC: 6 10*3/uL (ref 4.0–10.5)

## 2010-07-15 LAB — PROTIME-INR
INR: 0.73 (ref 0.00–1.49)
INR: 1.56 — ABNORMAL HIGH (ref 0.00–1.49)
Prothrombin Time: 10.5 seconds — ABNORMAL LOW (ref 11.6–15.2)

## 2010-07-15 LAB — PLATELET COUNT: Platelets: 73 10*3/uL — ABNORMAL LOW (ref 150–400)

## 2010-07-15 NOTE — Consult Note (Signed)
NAMEJOHNITA, [name] NO.:  [id number]  MEDICAL RECORD NO.:  [id number]           PATIENT TYPE:  I  LOCATION:  3709                         FACILITY:  [facility]  PHYSICIAN:  Duke Salvia, MD, FACCDATE OF BIRTH:  Jun 08, 1940  DATE OF CONSULTATION:  07/01/2010 DATE OF DISCHARGE:                                CONSULTATION   Thank you very much for asking [place] to see [name] in consultation because of syncope.  She is a 70 year old woman who has a history of nonischemic cardiomyopathy associated with cardiac arrest back in the 1990s.  She underwent ICD implantation and in 2003 underwent generator replacement with subsequent erosion in 2006.  At that time extraction was undertaken and there is apparently a retained ventricular lead segment.  She was found at that time also to have bilaterally occluded subclavian veins and was elected not to undergo a thoracotomy.  She has had no intercurrent syncope until this hospitalization.  The event occurred while at the washing machine.  She was apparently standing, not turning her head.  She mentioned to her husband that she felt like she was going to pass out.  He helped her go from standing to sitting at a chair that was immediately adjacent.  She was unconscious by the time he sat her down.  He called the friend.  She was out for about 1-2 minutes as best as he can estimate.  When she awakened, she was pale, nauseated and there was significant vomiting.  EMS was called and she was transported to the hospital.  Since being into the hospital, she has been in sinus rhythm although she has a history of recurrent atrial fibrillation.  She has had significant sinus node dysfunction with heart rates into the 30s which may be related to up titration of her beta-blocker from Coreg 3.125 daily to twice daily.  She also has a history of significant mitral regurgitation.  She has depressed left ventricular function and she has  been seeing Dr. Cornelius Moras in consultation for the consideration of repair of this valve.  One parenthetical note is that atrial fibrillation had precipitated hospitalization at the end of the calendar year 2011 and amiodarone was started at that time.  She has significant limitations of exercise tolerance.  She denies palpitations.  She has had some peripheral edema.  Dr. York Spaniel note refers to left bundle-branch block; I do not find that tracing.  PAST MEDICAL HISTORY:  Notable for; 1. Hypertension. 2. Hyperlipidemia. 3. Achalasia 4. There is also concern about an ongoing vegetations tricuspid valve;     her TE report is not immediately available.  PAST SURGICAL HISTORY:  Notable for her ICDs.  ALLERGIES:  She has no known drug allergies.  MEDICATIONS:  As an outpatient include lisinopril 40, warfarin, carvedilol, Lasix and amiodarone.  A medical reconciliation sheet says that she was taking carvedilol twice daily, the admission notes suggest once daily and hospital medications are notable for the addition of Keflex for UTI.  She is also on simvastatin.  SOCIAL HISTORY:  She is disabled.  She is married and lives with her husband.  She has 4 children.  FAMILY HISTORY:  Negative for cardiac arrest, atrial fibrillation.  REVIEW OF SYSTEMS:  Notable for obesity, glasses and cataract extraction, arthritis, ongoing problems with memory loss.  Other systems are negative.  PHYSICAL EXAMINATION:  GENERAL:  She is an elderly Caucasian female appearing her stated age of 41. VITAL SIGNS:  Her blood pressures are 128/75.  Her pulse has ranged from 60 to about 35.  She is afebrile. HEENT:  Normal. SKIN:  Warm and dry. NECK:  Neck veins were flat.  Her carotids were brisk and full. BACK:  Without kyphosis, scoliosis or CVA tenderness. LUNGS:  Clear. CARDIAC:  Heart sounds were regular but slow.  There is a 2/6 murmur heard best at the apex that radiating posteriorly.  ABDOMEN:   Soft, nontender without midline pulsation or hepatomegaly.  Femoral pulses were 2+.  Distal pulses were intact.  There is no clubbing or cyanosis or edema. NEUROLOGIC:  Grossly normal. I did not test her memory. MUSCULOSKELETAL:  There is no obvious musculoskeletal defects. LYMPH:  There is no adenopathy.  Electrocardiogram dated March 6 demonstrated sinus rhythm at 62 with intervals of 0.19/0.11/0.45.  There are borderline voltage criteria for left ventricular hypertrophy.  Other laboratories are notable for creatinine of 1.4, hemoglobin of 11.3.  Echo from 2011 December EF 35-40% with moderate - severe MR.  TSH normal.  INR 2.03.  IMPRESSION: 1. Syncope, question mechanism. 2. Sinus bradycardia - worsening. 3. paroxysmal atrial fibrillation, on amiodarone and carvedilol with a     question increased dose since admission from daily to b.i.d. 3.125. 4. Remote ventricular fibrillation arrest in 1990s, 5. Prior implantable cardioverter-defibrillator;     a.     With change outs.     b.     Complicated with erosion.     c.     Retained right ventricular fragment.     d.     Not reimplanted secondary to bilaterally occluded subclavian      veins. 6. Mitral regurgitation - severe that is symptomatic with an ejection     fraction of 35% with pending repair. 7. Narrow QRS.  DISCUSSION:  [name] has been scheduled for mitral valve repair surgery.  At that time, I would concur with the thoughts of Dr. Cornelius Moras and Dr. Donnie Aho that device reimplantation be undertaken.  This would include epicardial patches as well as pacing leads to the right atrium, right ventricle and left ventricle.  It would be worth considering attaching the leads to an abdominally implanted defibrillator depending on Dr. Orvan July thoughts as to be ability to easily place the device in the right or left prepectoral area.  She is having significant bradycardia.  I do not know whether this is related to the carvedilol  and/or the amiodarone but certainly with a history of syncope at her who is about 70s, she is not a candidate for discharge prior to her surgery.  RECOMMENDATIONS: 1. Based on that, I will plan to hold her carvedilol and her     amiodarone to see if her heart rate improves.  Perhaps at that time     consideration could be given to going home for couple of days. 2. CRT-D reimplantation with consideration for abdominal implant to be     noted at the time of the mitral valve repair, we will plan to be     available for that.     Duke Salvia, MD, [facility]     SCK/MEDQ  D:  07/01/2010  T:  07/02/2010  Job:  [id number]  Electronically Signed by Sherryl Manges MD [facility] on 07/15/2010 08:39:30 AM

## 2010-07-16 ENCOUNTER — Inpatient Hospital Stay (HOSPITAL_COMMUNITY): Payer: Medicare Other

## 2010-07-16 HISTORY — PX: MEDIAN STERNOTOMY: SUR860

## 2010-07-16 HISTORY — PX: MITRAL VALVE REPAIR: SHX2039

## 2010-07-16 HISTORY — PX: OTHER SURGICAL HISTORY: SHX169

## 2010-07-16 LAB — GLUCOSE, CAPILLARY
Glucose-Capillary: 120 mg/dL — ABNORMAL HIGH (ref 70–99)
Glucose-Capillary: 110 mg/dL — ABNORMAL HIGH (ref 70–99)
Glucose-Capillary: 111 mg/dL — ABNORMAL HIGH (ref 70–99)
Glucose-Capillary: 134 mg/dL — ABNORMAL HIGH (ref 70–99)
Glucose-Capillary: 138 mg/dL — ABNORMAL HIGH (ref 70–99)

## 2010-07-16 LAB — POCT I-STAT 3, ART BLOOD GAS (G3+)
Acid-Base Excess: 1 mmol/L (ref 0.0–2.0)
Acid-base deficit: 1 mmol/L (ref 0.0–2.0)
Bicarbonate: 24.8 mEq/L — ABNORMAL HIGH (ref 20.0–24.0)
O2 Saturation: 99 %
Patient temperature: 97.9
TCO2: 26 mmol/L (ref 0–100)
TCO2: 24 mmol/L (ref 0–100)
pCO2 arterial: 33.2 mmHg — ABNORMAL LOW (ref 35.0–45.0)
pCO2 arterial: 38.4 mmHg (ref 35.0–45.0)
pH, Arterial: 7.47 — ABNORMAL HIGH (ref 7.350–7.400)
pH, Arterial: 7.418 — ABNORMAL HIGH (ref 7.350–7.400)
pH, Arterial: 7.453 — ABNORMAL HIGH (ref 7.350–7.400)
pO2, Arterial: 359 mmHg — ABNORMAL HIGH (ref 80.0–100.0)
pO2, Arterial: 150 mmHg — ABNORMAL HIGH (ref 80.0–100.0)
pO2, Arterial: 136 mmHg — ABNORMAL HIGH (ref 80.0–100.0)

## 2010-07-16 LAB — CBC
HCT: 26.6 % — ABNORMAL LOW (ref 36.0–46.0)
HCT: 27.7 % — ABNORMAL LOW (ref 36.0–46.0)
Hemoglobin: 9.1 g/dL — ABNORMAL LOW (ref 12.0–15.0)
Hemoglobin: 9.5 g/dL — ABNORMAL LOW (ref 12.0–15.0)
MCV: 82.6 fL (ref 78.0–100.0)
RDW: 15.6 % — ABNORMAL HIGH (ref 11.5–15.5)
RDW: 16.5 % — ABNORMAL HIGH (ref 11.5–15.5)
WBC: 5.3 10*3/uL (ref 4.0–10.5)
WBC: 6.8 10*3/uL (ref 4.0–10.5)

## 2010-07-16 LAB — PREPARE FRESH FROZEN PLASMA
Unit division: 0
Unit division: 0

## 2010-07-16 LAB — POCT I-STAT 4, (NA,K, GLUC, HGB,HCT)
Glucose, Bld: 102 mg/dL — ABNORMAL HIGH (ref 70–99)
Glucose, Bld: 115 mg/dL — ABNORMAL HIGH (ref 70–99)
Glucose, Bld: 134 mg/dL — ABNORMAL HIGH (ref 70–99)
Glucose, Bld: 189 mg/dL — ABNORMAL HIGH (ref 70–99)
Glucose, Bld: 118 mg/dL — ABNORMAL HIGH (ref 70–99)
Glucose, Bld: 90 mg/dL (ref 70–99)
Glucose, Bld: 167 mg/dL — ABNORMAL HIGH (ref 70–99)
HCT: 21 % — ABNORMAL LOW (ref 36.0–46.0)
HCT: 24 % — ABNORMAL LOW (ref 36.0–46.0)
HCT: 24 % — ABNORMAL LOW (ref 36.0–46.0)
HCT: 21 % — ABNORMAL LOW (ref 36.0–46.0)
HCT: 36 % (ref 36.0–46.0)
HCT: 25 % — ABNORMAL LOW (ref 36.0–46.0)
HCT: 29 % — ABNORMAL LOW (ref 36.0–46.0)
Hemoglobin: 9.5 g/dL — ABNORMAL LOW (ref 12.0–15.0)
Hemoglobin: 7.1 g/dL — ABNORMAL LOW (ref 12.0–15.0)
Hemoglobin: 8.2 g/dL — ABNORMAL LOW (ref 12.0–15.0)
Hemoglobin: 9.2 g/dL — ABNORMAL LOW (ref 12.0–15.0)
Hemoglobin: 12.2 g/dL (ref 12.0–15.0)
Hemoglobin: 8.5 g/dL — ABNORMAL LOW (ref 12.0–15.0)
Hemoglobin: 9.9 g/dL — ABNORMAL LOW (ref 12.0–15.0)
Hemoglobin: 10.2 g/dL — ABNORMAL LOW (ref 12.0–15.0)
Potassium: 4.4 mEq/L (ref 3.5–5.1)
Potassium: 4.5 mEq/L (ref 3.5–5.1)
Potassium: 4.2 mEq/L (ref 3.5–5.1)
Potassium: 4.8 mEq/L (ref 3.5–5.1)
Potassium: 3.2 mEq/L — ABNORMAL LOW (ref 3.5–5.1)
Potassium: 3.1 mEq/L — ABNORMAL LOW (ref 3.5–5.1)
Potassium: 3.3 mEq/L — ABNORMAL LOW (ref 3.5–5.1)
Potassium: 4.2 mEq/L (ref 3.5–5.1)
Sodium: 140 mEq/L (ref 135–145)
Sodium: 135 mEq/L (ref 135–145)
Sodium: 137 mEq/L (ref 135–145)
Sodium: 138 mEq/L (ref 135–145)
Sodium: 145 mEq/L (ref 135–145)

## 2010-07-16 LAB — PREPARE PLATELETS
Unit division: 0
Unit division: 0
Unit division: 0

## 2010-07-16 LAB — BASIC METABOLIC PANEL
BUN: 12 mg/dL (ref 6–23)
CO2: 22 mEq/L (ref 19–32)
Chloride: 108 mEq/L (ref 96–112)
GFR calc non Af Amer: 56 mL/min — ABNORMAL LOW (ref >60)
Glucose, Bld: 129 mg/dL — ABNORMAL HIGH (ref 70–99)
Potassium: 3.6 mEq/L (ref 3.5–5.1)
Sodium: 137 mEq/L (ref 135–145)

## 2010-07-16 LAB — PREPARE CRYOPRECIPITATE: Unit division: 0

## 2010-07-16 LAB — CREATININE, SERUM
Creatinine, Ser: 1.15 mg/dL (ref 0.4–1.2)
GFR calc Af Amer: 56 mL/min — ABNORMAL LOW (ref >60)
GFR calc non Af Amer: 47 mL/min — ABNORMAL LOW (ref >60)

## 2010-07-16 NOTE — Op Note (Signed)
NAMENAYELLI, INGLIS NO.:  [id number]  MEDICAL RECORD NO.:  [id number]           PATIENT TYPE:  I  LOCATION:  2311                         FACILITY:  [facility]  PHYSICIAN:  Salvatore Decent. Cornelius Moras, M.D. DATE OF BIRTH:  1941-01-24  DATE OF PROCEDURE:  07/15/2010 DATE OF DISCHARGE:                              OPERATIVE REPORT   PREOPERATIVE DIAGNOSES: 1. Severe mitral regurgitation. 2. Recurrent persistent atrial fibrillation. 3. Symptomatic bradycardia. 4. History of cardiac arrest. 5. Dilated nonischemic cardiomyopathy. 6. History of multiple failed implanted cardiac defibrillators with     retained right ventricular endocardial defibrillator lead.  POSTOPERATIVE DIAGNOSES: 1. Severe mitral regurgitation. 2. Recurrent persistent atrial fibrillation. 3. Symptomatic bradycardia. 4. History of cardiac arrest. 5. Dilated nonischemic cardiomyopathy. 6. History of multiple failed implanted cardiac defibrillators with     retained right ventricular endocardial defibrillator lead.  PROCEDURES: 1. Median sternotomy. 2. Mitral valve repair (complex valvuloplasty including chordal     transposition x1, artificial Gore-Tex chord replacement x2, 26-mm     Sorin Memo 3D ring annuloplasty). 3. Cox maze procedure (complete biatrial lesion set with clipping of     left atrial appendage). 4. Removal of old endocardial right ventricular defibrillator lead. 5. Placement of dual chamber pacemaker and implantable cardiac     defibrillator with left ventricular, right ventricular, and right     atrial epicardial pacemaker leads and epicardial defibrillator     patches (Medtronic Protecta XT DR pacemaker defibrillator). 6. Placement of Swan Ganz pulmonary artery catheter via left femoral access.  SURGEON:  Salvatore Decent. Cornelius Moras, MD  ASSISTANT:  Coral Ceo, PA  ANESTHESIA:  Guadalupe Maple, MD  BRIEF CLINICAL NOTE:  The patient is a 70 year old female with complicated past medical  history including nonischemic cardiomyopathy with history of previous cardiac arrest, mitral valve prolapse with severe mitral regurgitation, recurrent persistent atrial fibrillation, and symptomatic bradycardia.  The patient was originally seen in consultation on April 16, 2010 and a full consultation note was dictated at that time.  The patient has continued to suffer from symptoms of worsening congestive heart failure.  The patient also was hospitalized with a syncopal episode and following hospitalization, the patient has had episodes of symptomatic bradycardia documented on telemetry.  The patient and her husband have been counseled at length regarding a variety of possible treatment alternatives including both surgical and nonoperative therapy.  A variety of surgical approaches have been discussed in detail and after considerable discussion, plans for surgery have been made.  The patient and her husband understand and accept all associated risks of surgery, desire to proceed as described.  OPERATIVE FINDINGS: 1. Moderate left ventricular dysfunction with nonischemic     cardiomyopathy, ejection fraction estimated 35-40%. 2. 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 3. Combination of type 1 and type 2 mitral valve dysfunction due to #1     and #2 respectively. 4. 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 5. Massive engorgement of venous collateral channels from chronic  superior vena cava occlusion. 6. No residual mitral regurgitation following successful mitral valve     repair.  OPERATIVE PROCEDURE IN DETAIL:  The patient was brought to the operating room on the above-mentioned date and placed in the supine position on the operating table.  A radial arterial line was placed.  A short external jugular venous catheter was placed in the left external  jugular vein for IV access.  General endotracheal anesthesia was induced uneventfully under the care and direction of Dr. Kipp Brood.  A Foley catheter was placed.  The patient's neck, chest, abdomen, both groins, and both lower extremities were prepared and draped in sterile manner.  Baseline transesophageal echocardiogram was performed by Dr. Noreene Larsson. This confirms the presence of dilated nonischemic cardiomyopathy with severe left atrial enlargement and moderate left ventricular enlargement.  There was prolapse of the middle scallop of the posterior leaflet of the mitral valve with elongated chords to this segment. There were no ruptured chords.  The jet of mitral regurgitation courses anteriorly around the atrium and appears to emanate as a combination of type 1 and type 2 dysfunction.  There was mild tricuspid regurgitation. The tricuspid annulus was not dilated.  Right ventricular function appears normal.  No other significant abnormalities were noted.  Median sternotomy incision was performed.  This was associated with unusual bleeding superiorly due to dilated and engorged veins both anterior to and posterior to the sternum because of the patient's chronically occluded central veins.  The pericardium was opened.  There were no pericardial adhesions and the external surface of the heart appears normal.  A Cordis introducing sheath was placed in the left common femoral vein. A Swan-Ganz pulmonary artery catheter was passed through the Cordis introducing sheath and advanced under transesophageal echocardiographic guidance up through the right atrium into the right ventricle.  The patient was now heparinized systemically.  The right common femoral vein was cannulated with a Seldinger technique and long flexible guidewire was advanced up through the inferior vena cava through the right atrium using transesophageal echocardiogram guidance.  The femoral vein was dilated with serial  dilators and cannulated with a long 22-French femoral venous cannula.  The cannula was passed until the tip of the cannula was noted in the right atrium using transesophageal echocardiogram guidance.  The ascending aorta was cannulated directly for cardiopulmonary bypass.  A retrograde cardioplegic cannula was placed through the right atrium into the coronary sinus.  Cardiopulmonary bypass was begun.  Vacuum-assisted venous drainage was utilized.  A small straight venous cannula was placed directly through the tip of the right atrial appendage into the right atrium to supplement venous drainage.  An antegrade cardioplegic cannula was placed in the proximal ascending thoracic aorta.  The patient was cooled to 28 degrees systemic temperature.  The aortic crossclamp was applied and cold blood cardioplegia was delivered in an antegrade fashion through the aortic root.  Iced saline slush was applied for topical hypothermia.  The initial cardioplegic arrest was rapid with early diastolic arrest and rapid left ventricular myocardial cooling.  Supplemental cardioplegia was administered retrograde through the coronary sinus catheter.  Repeat doses of cardioplegia were administered every 20 minutes throughout the entire crossclamp portion of the operation retrograde through the coronary sinus catheter to maintain completely flat electrocardiogram.  Left ventricular myocardial protection was felt to be excellent.  The heart was retracted towards the surgeon's side to expose the reflection of the left-sided pulmonary veins.  The left-sided pulmonary veins were encircled and subsequently the  AtriCure bipolar radiofrequency ablation clamp was utilized to create an elliptical ablation lesion around the base of the left-sided pulmonary veins on the left atrium.  The left atrial appendage was now obliterated with the AtriCure epicardial left atrial appendage clip.  An epicardial defibrillator patch  was now sewn to the posterolateral wall of left ventricle using several interrupted 4-0 Prolene sutures around the circumference of the patch (Medtronic patch model number 6721M - 50 cm, serial number [id number]).  A single left ventricular epicardial screw in pacemaker lead was placed high on the lateral left ventricular wall (Medtronic model number 5071 - 53 cm, serial number [id number]).  The heart was now replaced into the pericardial sac.  A second epicardial defibrillator patch was now sewn to the anterior right ventricular epicardial surface (Medtronic model number 6721M - 50 cm, serial number [id number]).  A bipolar epicardial pacemaker lead was now sewn to the right ventricular outflow tract (Medtronic model number 4968 - 60 cm, serial number [id number]).  The AtriCure Frigitronics cryothermy probe was utilized to create a linear lesion on the epicardial surface of the right atrium extending from the superior vena cava to the inferior vena cava along the lateral wall with a 3-minute freeze time.  A left atriotomy incision was performed posteriorly through the interatrial groove and continue partway across the back wall of the left atrium after opening the oblique sinus inferiorly.  A self-retaining retractor was placed and exposure of the floor of the left atrium and mitral valve was felt to be satisfactory.  The remainder of the left side lesion set of the Cox maze procedure was now completed.  Initially, the AtriCure bipolar radiofrequency ablation clamp was utilized to complete an elliptical lesion around the right-sided pulmonary veins. The atriotomy incision serves as the anterior half of the ellipse and the posterior half was completed with one limb of the bipolar clamp along the epicardial surface and one along the endocardial surface.  Box lesion was then completed with a bipolar clamp initially going across the dome of the atrium from the cephalad apex of the atriotomy  incision to reach the cephalad apex of the elliptical lesion surrounding the left- sided pulmonary veins.  A similar lesion was completed inferiorly across the back wall of the left atrium from the inferior apex of the atriotomy incision to reach the inferior apex of the left-sided pulmonary vein lesion.  Both of these lesions were performed with one limb of the bipolar clamp along the endocardial surface and one along the epicardial surface.  The cryothermy probe was now utilized to create lesion continuing across the back wall of left atrium from the inferior apex of the atriotomy incision to reach the mitral valve.  Initially, a lesion was created along the epicardial surface of the coronary sinus posteriorly and then a mirror image lesion was created along the endocardial surface to reach the posterior mitral annulus.  One final cryothermy lesion was created across the dome of the atrium from the cephalad apex of the atriotomy incision to reach the cephalad course of the left-sided pulmonary veins.  This was a redundant lesion and completes the entire left-sided lesion set of the Cox maze procedure.  The mitral valve was now addressed.  Mitral valve was carefully examined.  The functional anatomy appears to be combination of annular dilatation (type 1) as well as prolapse of the middle scallop (P2) of the posterior leaflet due to fibroelastic deficiency with stretched out, but intact chordae tendineae (type 2 dysfunction).  Interrupted 2-0 Ethibond horizontal mattress sutures were placed circumferentially around the entire mitral valve annulus.  These sutures will ultimately be utilized for ring annuloplasty, and at this juncture, they were utilized to suspend the valve symmetrically.  Because of the delicate nature of the valve and relatively small size of the valve, leaflet preservation was felt to be optimal.  A normal length secondary chord to the middle of the posterior leaflet  was chosen for chordal transposition to the prolapsing free margin.  This chord was amputated from the undersurface of the posterior leaflet and reimplanted on the free margin with a figure-of-eight CV5 Gore-Tex suture.  Artificial Gore-Tex chords were utilized for additional support.  A pledgeted CV4 Gore-Tex suture was placed through the head of the posterior papillary muscle and the suture was tied.  Each limb of the suture was now gathered in organized fashion and placed into the left ventricular chamber for later retrieval.  The valve was tested with saline and already appears competent simply with the chordal transposition maneuver.  The valve was sized to accept a 26-mm annuloplasty ring, which corresponds fairly precisely to the dimensions of the anterior leaflet.  A Sorin Memo 3D annuloplasty ring (size 26 mm, catalog number [id number], serial number [id number]) was secured in place uneventfully.  The Gore-Tex chord sutures were now retrieved from the left ventricular chamber.  The valve was tested by filling the heart with saline and the valve appears to be perfectly competent.  The two limbs of the Gore-Tex chord were now woven through the middle scallop of the posterior leaflet beginning at the free margin where the suture goes from the ventricular to the atrial surface of the free margin of the posterior leaflet.  Each limb was then woven in a diamond shape fashion towards the posterior annulus.  The heart was again filled with saline before tying the Gore-Tex suture to an appropriate length.  The tag of the cut Gore-Tex suture was now tacked to the posterior mitral annuloplasty ring with a CV6 Gore-Tex suture.  The valve was again tested with saline and appears to be perfectly competent.  There was a broad symmetrical line of coaptation of the anterior posterior leaflet with no residual regurgitation whatsoever.  Rewarming was begun.  A sump drain was placed across the mitral valve  to serve as a left ventricular vent.  The left atriotomy incision was closed posteriorly using a two-layer closure of running 3-0 Prolene suture.  An oblique right atriotomy incision was performed after pulling the inferior vena cava cannula back down and clamping the right atrial cannula.  The existing retained fragment of the endocardial right ventricular defibrillator lead was identified and subsequently sharply dissected away from the tricuspid valve and from the endocardial surface of the right ventricle and right atrium.  The lead was removed in several fragments, but ultimately successfully removed.  The tricuspid valve was tested and appears still to be competent.  The indwelling pulmonary artery catheter was noted to be in the loop and this loop was removed and the catheter was refloated.  A single endocardial cryothermy ablation lesion was now performed to complete the right atrial lesion set of the Cox maze procedure.  This lesion goes from the anterior apex of the right atriotomy incision along the endocardial surface to reach the anterolateral surface of the tricuspid annulus at the 2 o'clock position.  The right atriotomy incision was now closed using a two-layer closure of running 4-0 Prolene suture.  One final dose  of warm retrograde hot shot cardioplegia was administered and the aortic crossclamp was removed after total crossclamp time of 161 minutes.  The heart began to beat spontaneously without need for cardioversion. Temporary epicardial pacing wires were fixed to the undersurface of the right ventricular free wall into the right atrial appendage.  Bipolar permanent epicardial right atrial pacing lead was now placed along the base of the right atrial appendage (Medtronic lead model number 4968 - 60 cm, serial number [id number]).  All of the leads from the pacemaker lead and epicardial were not tunneled inferiorly into a subcutaneous pocket created in the left upper  quadrant of the abdominal wall.  The lungs were ventilated and the heart allowed to fill after which time left ventricular vent was removed.  The lungs were ventilated and the heart again allowed to fill while the mitral valve repair was carefully examined.  The repair appears to be intact.  At this juncture, the pacemaker leads were tested.  The right atrial lead has excellent impedance, but the right ventricular lead has very high impedance suggestive of a problem.  Subsequently, an additional bipolar epicardial ventricular pacing lead was placed, this time along the diaphragmatic surface of the right ventricle (Medtronic lead model number 4968 - 35 cm, serial number [id number]).  This right ventricular lead was attached to pacemaker defibrillator (Medtronic [place] XT DR [id number], serial number [id number]).  The right atrial pacing lead was attached to the pacemaker and the two defibrillator epicardial patches were all also attached to the defibrillator port.  The device was now interrogated and appropriate pacemaker lead impedance.  The patient's thresholds were documented.  A single 1 joule countershock test was performed to make certain integrity of the defibrillator circuit.  The pacemaker defibrillator box was now placed into the subcutaneous pocket on the anterior wall and soaked in multiple antibiotic irrigation solution.  The mediastinum was irrigated with saline solution and inspected for hemostasis.  There was diffuse coagulopathy.  The patient was transfused with platelets and fresh frozen plasma to correct the coagulopathy.  The mediastinum and the right pleural space were drained using three chest tubes placed through separate stab incisions inferiorly.  The pericardium was closed using two patches of CorMatrix bovine submucosal tissue patch.  The median sternotomy was closed in routine fashion.  The pacemaker defibrillator box pocket was closed in multiple layers  in routine fashion.  All skin incisions were closed with subcuticular skin closure.  At this juncture, there was noted excessive output from the chest tubes. Additional blood products were administered to correct the coagulopathy and the patient was observed briefly for 15 minutes in the operating room.  Due to continued chest tube output, the median sternotomy incision was re-opened and re-explored.  The mediastinum was irrigated with copious warm saline solution.  There was diffuse oozing from the venous collateral channels along the sternum and the superior mediastinum.  The chest was kept open while additional blood products and NovoSeven (recombinant factor VII) was administered to correct the coagulopathy.  Ultimately, all the oozing stopped.  No other significant sites of mechanical bleeding were identified.  The median sternotomy was reclosed in routine fashion and skin incision closed with subcuticular skin closure.  The patient tolerated the procedure well and was transported to the surgical intensive care unit in stable condition.  There were no intraoperative complications.  All sponge, instrument and needle counts were verified and correct at completion of the operation.     Salvatore Decent. Cornelius Moras, M.D.  CHO/MEDQ  D:  07/15/2010  T:  07/16/2010  Job:  [id number]  cc:   Georga Hacking, M.D. Dario Guardian, M.D. Duke Salvia, MD, [facility]  Electronically Signed by Tressie Stalker M.D. on 07/16/2010 09:10:18 AM

## 2010-07-17 ENCOUNTER — Inpatient Hospital Stay (HOSPITAL_COMMUNITY): Payer: Medicare Other

## 2010-07-17 LAB — CBC
HCT: 28.2 % — ABNORMAL LOW (ref 36.0–46.0)
Hemoglobin: 9.4 g/dL — ABNORMAL LOW (ref 12.0–15.0)
MCV: 86.8 fL (ref 78.0–100.0)
WBC: 8.1 10*3/uL (ref 4.0–10.5)

## 2010-07-17 LAB — TYPE AND SCREEN
ABO/RH(D): A POS
Antibody Screen: NEGATIVE
Unit division: 0
Unit division: 0
Unit division: 0
Unit division: 0
Unit division: 0

## 2010-07-17 LAB — GLUCOSE, CAPILLARY
Glucose-Capillary: 138 mg/dL — ABNORMAL HIGH (ref 70–99)
Glucose-Capillary: 143 mg/dL — ABNORMAL HIGH (ref 70–99)
Glucose-Capillary: 182 mg/dL — ABNORMAL HIGH (ref 70–99)
Glucose-Capillary: 113 mg/dL — ABNORMAL HIGH (ref 70–99)

## 2010-07-17 LAB — BASIC METABOLIC PANEL
BUN: 17 mg/dL (ref 6–23)
BUN: 25 mg/dL — ABNORMAL HIGH (ref 6–23)
CO2: 26 mEq/L (ref 19–32)
GFR calc Af Amer: 44 mL/min — ABNORMAL LOW (ref >60)
GFR calc non Af Amer: 36 mL/min — ABNORMAL LOW (ref >60)
Glucose, Bld: 143 mg/dL — ABNORMAL HIGH (ref 70–99)
Potassium: 3.8 mEq/L (ref 3.5–5.1)
Potassium: 4.1 mEq/L (ref 3.5–5.1)
Sodium: 139 mEq/L (ref 135–145)
Sodium: 139 mEq/L (ref 135–145)

## 2010-07-18 ENCOUNTER — Inpatient Hospital Stay (HOSPITAL_COMMUNITY): Payer: Medicare Other

## 2010-07-18 LAB — CBC
HCT: 26.9 % — ABNORMAL LOW (ref 36.0–46.0)
Hemoglobin: 8.9 g/dL — ABNORMAL LOW (ref 12.0–15.0)
MCH: 29.7 pg (ref 26.0–34.0)
RBC: 3 MIL/uL — ABNORMAL LOW (ref 3.87–5.11)

## 2010-07-18 LAB — GLUCOSE, CAPILLARY
Glucose-Capillary: 102 mg/dL — ABNORMAL HIGH (ref 70–99)
Glucose-Capillary: 94 mg/dL (ref 70–99)
Glucose-Capillary: 173 mg/dL — ABNORMAL HIGH (ref 70–99)

## 2010-07-18 LAB — BASIC METABOLIC PANEL
GFR calc non Af Amer: 38 mL/min — ABNORMAL LOW (ref >60)
Glucose, Bld: 108 mg/dL — ABNORMAL HIGH (ref 70–99)
Potassium: 3.5 mEq/L (ref 3.5–5.1)
Sodium: 140 mEq/L (ref 135–145)

## 2010-07-18 LAB — PROTIME-INR
INR: 1.18 (ref 0.00–1.49)
Prothrombin Time: 15.2 seconds (ref 11.6–15.2)

## 2010-07-19 ENCOUNTER — Inpatient Hospital Stay (HOSPITAL_COMMUNITY): Payer: Medicare Other

## 2010-07-19 LAB — GLUCOSE, CAPILLARY
Glucose-Capillary: 100 mg/dL — ABNORMAL HIGH (ref 70–99)
Glucose-Capillary: 167 mg/dL — ABNORMAL HIGH (ref 70–99)
Glucose-Capillary: 86 mg/dL (ref 70–99)

## 2010-07-19 LAB — CBC
Platelets: 108 10*3/uL — ABNORMAL LOW (ref 150–400)
RBC: 2.91 MIL/uL — ABNORMAL LOW (ref 3.87–5.11)
WBC: 6.4 10*3/uL (ref 4.0–10.5)

## 2010-07-19 LAB — PROTIME-INR
INR: 1.2 (ref 0.00–1.49)
Prothrombin Time: 15.4 seconds — ABNORMAL HIGH (ref 11.6–15.2)

## 2010-07-19 LAB — BASIC METABOLIC PANEL
Chloride: 102 mEq/L (ref 96–112)
GFR calc Af Amer: 55 mL/min — ABNORMAL LOW (ref >60)
Potassium: 3.4 mEq/L — ABNORMAL LOW (ref 3.5–5.1)

## 2010-07-20 LAB — CBC
MCV: 89.9 fL (ref 78.0–100.0)
Platelets: 130 10*3/uL — ABNORMAL LOW (ref 150–400)
RBC: 2.98 MIL/uL — ABNORMAL LOW (ref 3.87–5.11)
WBC: 6.2 10*3/uL (ref 4.0–10.5)

## 2010-07-20 LAB — BASIC METABOLIC PANEL
Chloride: 101 mEq/L (ref 96–112)
Creatinine, Ser: 1.25 mg/dL — ABNORMAL HIGH (ref 0.4–1.2)
GFR calc Af Amer: 51 mL/min — ABNORMAL LOW (ref >60)
GFR calc non Af Amer: 42 mL/min — ABNORMAL LOW (ref >60)
Potassium: 3.8 mEq/L (ref 3.5–5.1)

## 2010-07-20 LAB — PROTIME-INR: INR: 1.17 (ref 0.00–1.49)

## 2010-07-20 LAB — GLUCOSE, CAPILLARY
Glucose-Capillary: 102 mg/dL — ABNORMAL HIGH (ref 70–99)
Glucose-Capillary: 182 mg/dL — ABNORMAL HIGH (ref 70–99)
Glucose-Capillary: 128 mg/dL — ABNORMAL HIGH (ref 70–99)

## 2010-07-21 ENCOUNTER — Inpatient Hospital Stay (HOSPITAL_COMMUNITY): Payer: Medicare Other

## 2010-07-21 DIAGNOSIS — R5381 Other malaise: Secondary | ICD-10-CM

## 2010-07-21 LAB — GLUCOSE, CAPILLARY
Glucose-Capillary: 113 mg/dL — ABNORMAL HIGH (ref 70–99)
Glucose-Capillary: 172 mg/dL — ABNORMAL HIGH (ref 70–99)
Glucose-Capillary: 123 mg/dL — ABNORMAL HIGH (ref 70–99)

## 2010-07-21 LAB — URINALYSIS, ROUTINE W REFLEX MICROSCOPIC
Bilirubin Urine: NEGATIVE
Glucose, UA: NEGATIVE mg/dL
Ketones, ur: NEGATIVE mg/dL
Nitrite: NEGATIVE
Protein, ur: NEGATIVE mg/dL
Specific Gravity, Urine: 1.01 (ref 1.005–1.030)
Urobilinogen, UA: 0.2 mg/dL (ref 0.0–1.0)
pH: 7 (ref 5.0–8.0)

## 2010-07-21 LAB — COMPREHENSIVE METABOLIC PANEL
ALT: 33 U/L (ref 0–35)
AST: 23 U/L (ref 0–37)
Albumin: 2.6 g/dL — ABNORMAL LOW (ref 3.5–5.2)
Calcium: 8.2 mg/dL — ABNORMAL LOW (ref 8.4–10.5)
Creatinine, Ser: 1.43 mg/dL — ABNORMAL HIGH (ref 0.4–1.2)
GFR calc Af Amer: 44 mL/min — ABNORMAL LOW (ref >60)
Sodium: 137 mEq/L (ref 135–145)

## 2010-07-21 LAB — CBC
Hemoglobin: 9.1 g/dL — ABNORMAL LOW (ref 12.0–15.0)
MCH: 29.3 pg (ref 26.0–34.0)
MCHC: 32.3 g/dL (ref 30.0–36.0)
Platelets: 149 10*3/uL — ABNORMAL LOW (ref 150–400)
RBC: 3.11 MIL/uL — ABNORMAL LOW (ref 3.87–5.11)

## 2010-07-21 LAB — PROTIME-INR
INR: 1.1 (ref 0.00–1.49)
Prothrombin Time: 14.4 seconds (ref 11.6–15.2)

## 2010-07-21 LAB — HEPARIN INDUCED THROMBOCYTOPENIA PNL: UFH High Dose UFH H: 0 % Release

## 2010-07-21 LAB — URINE MICROSCOPIC-ADD ON

## 2010-07-22 ENCOUNTER — Inpatient Hospital Stay (HOSPITAL_COMMUNITY): Payer: Medicare Other

## 2010-07-22 LAB — PROTIME-INR: Prothrombin Time: 14.8 seconds (ref 11.6–15.2)

## 2010-07-22 LAB — CBC
MCV: 90.8 fL (ref 78.0–100.0)
Platelets: 166 10*3/uL (ref 150–400)
RDW: 17.3 % — ABNORMAL HIGH (ref 11.5–15.5)
WBC: 6.9 10*3/uL (ref 4.0–10.5)

## 2010-07-22 LAB — BASIC METABOLIC PANEL
BUN: 20 mg/dL (ref 6–23)
Creatinine, Ser: 1.27 mg/dL — ABNORMAL HIGH (ref 0.4–1.2)
GFR calc Af Amer: 50 mL/min — ABNORMAL LOW (ref >60)
GFR calc non Af Amer: 42 mL/min — ABNORMAL LOW (ref >60)
Potassium: 4 mEq/L (ref 3.5–5.1)

## 2010-07-22 LAB — GLUCOSE, CAPILLARY
Glucose-Capillary: 117 mg/dL — ABNORMAL HIGH (ref 70–99)
Glucose-Capillary: 117 mg/dL — ABNORMAL HIGH (ref 70–99)

## 2010-07-22 LAB — URINE CULTURE
Colony Count: NO GROWTH
Culture  Setup Time: 201203281038
Culture: NO GROWTH

## 2010-07-23 ENCOUNTER — Inpatient Hospital Stay (HOSPITAL_COMMUNITY)
Admission: RE | Admit: 2010-07-23 | Discharge: 2010-08-05 | DRG: 945 | Disposition: A | Discharge status: Rehabilitation Facility | Payer: Medicare Other | Source: Other Acute Inpatient Hospital | Attending: Physical Medicine & Rehabilitation | Admitting: Physical Medicine & Rehabilitation

## 2010-07-23 DIAGNOSIS — D62 Acute posthemorrhagic anemia: Secondary | ICD-10-CM

## 2010-07-23 DIAGNOSIS — I509 Heart failure, unspecified: Secondary | ICD-10-CM

## 2010-07-23 DIAGNOSIS — Z5189 Encounter for other specified aftercare: Principal | ICD-10-CM

## 2010-07-23 DIAGNOSIS — Z950 Presence of cardiac pacemaker: Secondary | ICD-10-CM

## 2010-07-23 DIAGNOSIS — I059 Rheumatic mitral valve disease, unspecified: Secondary | ICD-10-CM | POA: Diagnosis present

## 2010-07-23 DIAGNOSIS — R5381 Other malaise: Secondary | ICD-10-CM

## 2010-07-23 DIAGNOSIS — I4891 Unspecified atrial fibrillation: Secondary | ICD-10-CM | POA: Diagnosis present

## 2010-07-23 DIAGNOSIS — N318 Other neuromuscular dysfunction of bladder: Secondary | ICD-10-CM | POA: Diagnosis present

## 2010-07-23 LAB — BASIC METABOLIC PANEL
BUN: 19 mg/dL (ref 6–23)
CO2: 27 mEq/L (ref 19–32)
Calcium: 8.4 mg/dL (ref 8.4–10.5)
Chloride: 102 mEq/L (ref 96–112)
Creatinine, Ser: 1.29 mg/dL — ABNORMAL HIGH (ref 0.4–1.2)
GFR calc Af Amer: 49 mL/min — ABNORMAL LOW (ref >60)
GFR calc non Af Amer: 41 mL/min — ABNORMAL LOW (ref >60)
Glucose, Bld: 104 mg/dL — ABNORMAL HIGH (ref 70–99)
Potassium: 3.6 mEq/L (ref 3.5–5.1)
Sodium: 139 mEq/L (ref 135–145)

## 2010-07-23 LAB — PROTIME-INR
INR: 1.28 (ref 0.00–1.49)
Prothrombin Time: 16.2 seconds — ABNORMAL HIGH (ref 11.6–15.2)

## 2010-07-24 DIAGNOSIS — I059 Rheumatic mitral valve disease, unspecified: Secondary | ICD-10-CM

## 2010-07-24 LAB — PROTIME-INR: Prothrombin Time: 19.7 seconds — ABNORMAL HIGH (ref 11.6–15.2)

## 2010-07-25 LAB — BASIC METABOLIC PANEL
BUN: 17 mg/dL (ref 6–23)
CO2: 27 mEq/L (ref 19–32)
Calcium: 8.3 mg/dL — ABNORMAL LOW (ref 8.4–10.5)
Chloride: 103 mEq/L (ref 96–112)
GFR calc Af Amer: 55 mL/min — ABNORMAL LOW (ref >60)
Glucose, Bld: 109 mg/dL — ABNORMAL HIGH (ref 70–99)
Potassium: 3.9 mEq/L (ref 3.5–5.1)

## 2010-07-25 LAB — CBC
MCH: 28.7 pg (ref 26.0–34.0)
Platelets: 225 10*3/uL (ref 150–400)
RBC: 2.82 MIL/uL — ABNORMAL LOW (ref 3.87–5.11)
RDW: 17.2 % — ABNORMAL HIGH (ref 11.5–15.5)
WBC: 5 10*3/uL (ref 4.0–10.5)

## 2010-07-26 DIAGNOSIS — R5381 Other malaise: Secondary | ICD-10-CM

## 2010-07-26 DIAGNOSIS — I509 Heart failure, unspecified: Secondary | ICD-10-CM

## 2010-07-26 DIAGNOSIS — D62 Acute posthemorrhagic anemia: Secondary | ICD-10-CM

## 2010-07-26 DIAGNOSIS — Z5189 Encounter for other specified aftercare: Secondary | ICD-10-CM

## 2010-07-26 LAB — GLUCOSE, CAPILLARY: Glucose-Capillary: 111 mg/dL — ABNORMAL HIGH (ref 70–99)

## 2010-07-26 LAB — PROTIME-INR
INR: 1.94 — ABNORMAL HIGH (ref 0.00–1.49)
Prothrombin Time: 22.3 seconds — ABNORMAL HIGH (ref 11.6–15.2)

## 2010-07-27 LAB — PROTIME-INR: Prothrombin Time: 25.9 seconds — ABNORMAL HIGH (ref 11.6–15.2)

## 2010-07-27 LAB — CBC
MCH: 29.1 pg (ref 26.0–34.0)
MCHC: 31.6 g/dL (ref 30.0–36.0)
Platelets: 271 10*3/uL (ref 150–400)

## 2010-07-29 LAB — BASIC METABOLIC PANEL
BUN: 17 mg/dL (ref 6–23)
CO2: 25 mEq/L (ref 19–32)
Chloride: 99 mEq/L (ref 96–112)
Creatinine, Ser: 1.36 mg/dL — ABNORMAL HIGH (ref 0.4–1.2)
Glucose, Bld: 138 mg/dL — ABNORMAL HIGH (ref 70–99)

## 2010-07-29 LAB — CBC
HCT: 29.6 % — ABNORMAL LOW (ref 36.0–46.0)
MCH: 28.8 pg (ref 26.0–34.0)
MCHC: 31.1 g/dL (ref 30.0–36.0)
MCV: 92.5 fL (ref 78.0–100.0)
RDW: 17.3 % — ABNORMAL HIGH (ref 11.5–15.5)

## 2010-07-30 DIAGNOSIS — Z5189 Encounter for other specified aftercare: Secondary | ICD-10-CM

## 2010-07-30 DIAGNOSIS — R5381 Other malaise: Secondary | ICD-10-CM

## 2010-07-30 DIAGNOSIS — I509 Heart failure, unspecified: Secondary | ICD-10-CM

## 2010-07-30 DIAGNOSIS — D581 Hereditary elliptocytosis: Secondary | ICD-10-CM

## 2010-07-30 LAB — PROTIME-INR: INR: 2.11 — ABNORMAL HIGH (ref 0.00–1.49)

## 2010-08-01 LAB — PROTIME-INR
INR: 2.05 — ABNORMAL HIGH (ref 0.00–1.49)
Prothrombin Time: 23.3 seconds — ABNORMAL HIGH (ref 11.6–15.2)

## 2010-08-02 LAB — PROTIME-INR
INR: 2.26 — ABNORMAL HIGH (ref 0.00–1.49)
Prothrombin Time: 25.1 seconds — ABNORMAL HIGH (ref 11.6–15.2)

## 2010-08-03 LAB — PROTIME-INR: Prothrombin Time: 26.4 seconds — ABNORMAL HIGH (ref 11.6–15.2)

## 2010-08-04 DIAGNOSIS — I509 Heart failure, unspecified: Secondary | ICD-10-CM

## 2010-08-04 DIAGNOSIS — Z5189 Encounter for other specified aftercare: Secondary | ICD-10-CM

## 2010-08-04 DIAGNOSIS — R5381 Other malaise: Secondary | ICD-10-CM

## 2010-08-04 DIAGNOSIS — D62 Acute posthemorrhagic anemia: Secondary | ICD-10-CM

## 2010-08-07 NOTE — Op Note (Signed)
NAMEISA, HITZ NO.:  [id number]  MEDICAL RECORD NO.:  [id number]          PATIENT TYPE:  LOCATION:                                 FACILITY:  PHYSICIAN:  Margaret [name], M.D.  DATE OF BIRTH:  1940/06/25  DATE OF PROCEDURE:  07/15/2010 DATE OF DISCHARGE:                              OPERATIVE REPORT   PROCEDURE:  Intraoperative transesophageal echocardiography.  [name] is a 70 year old white female with a history of nonischemic cardiomyopathy who has had defibrillators placed in 2003 and 2006.  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  She has also developed worsening mitral regurgitation and has had a syncopal episode and is now scheduled to undergo mitral valve repair and removal of an existing defibrillator lead and placement of extracardiac defibrillator patches and epicardial pacing leads.  The patient was brought to the operating room at [facility] and general anesthesia was induced without difficulty.  The trachea was intubated without difficulty.  Following orogastric suctioning, the transesophageal echocardiography probe was inserted into the esophagus without difficulty.  IMPRESSION:  Prebypass findings: 1. Aortic valve.  The aortic valve was trileaflet.  The leaflets were     mildly thickened, but opened normally.  There was no calcification     of the leaflets and there was trace aortic insufficiency. 2. Mitral valve.  There was moderate mitral annular calcification and     there was moderate mitral regurgitation.  In the P2 area of the     posterior leaflet, was billowing but there was no flail leaflets     noted.  There was a jet of mitral regurgitation, which was     anteriorly directed along the superior surface of the anterior     leaflet.  These findings were confirmed with three-dimensional     views as well. 3. Left ventricle.  There was mild left  ventricular dysfunction with     mild global left ventricular hypokinesis.  Ejection fraction     estimated at 45%.  The left ventricular cavity was enlarged with a     left ventricular end-diastolic diameter of 5.75 cm at the mid     papillary level in the short axis view.  Left ventricular wall     thickness measured 0.8-0.95 cm in diameter at the end diastole at     the mid papillary level.  Left ventricular end-systolic diameter     measured 4.45 cm.  There was no thrombus noted in the left     ventricular apex. 4. Right ventricle.  The right ventricular size was within normal     limits.  There was good contractility of the right ventricular free     wall and normal-appearing right ventricular function.  There     appeared to be some increased density in the area of the tricuspid     annulus.  This did not appear to be a vegetation.  There was an     evident defibrillator lead within the right atrium and right     ventricle crossing the tricuspid valve. 5.  Tricuspid valve.  The tricuspid valve appeared structurally intact     and there was 1+ tricuspid insufficiency. 6. Interatrial septum.  The interatrial septum was intact without     evidence of patent foramen ovale or atrial septal defect. 7. Left atrium.  The left atrial cavity was markedly enlarged and     measured 6.35 cm in the mediolateral dimension and 6.1 cm in the     anterior-posterior dimension.  There was no thrombus noted in the     left atrial cavity or left atrial appendage. 8. Ascending aorta.  There was moderate thickening of the proximal     ascending aorta with some calcification within the wall.  There was     no aneurysmal dilatation at the ascending aorta and there was a     well-defined sinotubular ridge in aortic root. 9. Descending aorta.  There was mild atheromatous disease noted in the     descending aorta and the vessel measured 2.4 cm in diameter.  Postbypass findings: 1. Aortic valve.  The aortic  valve was unchanged from the prebypass     study.  There was mild thickening of the aortic leaflets and trace     aortic insufficiency. 2. Mitral valve.  There was an annuloplasty ring in the mitral     position.  The anterior leaflet was freely mobile and the posterior     leaflet appeared fixed.  There was a small trace of mitral     regurgitation that was evident.  The continuous wave Doppler     interrogation in the mitral inflow revealed a mitral valve area of     2.53 by the pressure half-time method in a peak gradient of 4 mmHg. 3. Left ventricle.  Initially, views of the left ventricle revealed     there was some air remaining within the left ventricular cavity,     but this resolved over time or following separation from     cardiopulmonary bypass.  There was some dyssynchrony of ventricular     contraction due to ventricular pacing.  Ejection fraction of the     left ventricle was estimated at 40-45%. 4. Right ventricle.  There was some decreased contractility of the     right ventricular free wall and some dyssynchrony of right     ventricular contractility as well, but there was no marked right     ventricular enlargement noted.          ______________________________ Margaret [name], M.D.     DCJ/MEDQ  D:  07/15/2010  T:  07/16/2010  Job:  [id number]  Electronically Signed by Kipp Brood M.D. on 08/07/2010 11:39:44 AM

## 2010-08-25 NOTE — Discharge Summary (Signed)
Margaret [name], [name] NO.:  [id number]  MEDICAL RECORD NO.:  [id number]           PATIENT TYPE:  I  LOCATION:  4004                         FACILITY:  [facility]  PHYSICIAN:  Ranelle Oyster, M.D.DATE OF BIRTH:  26-Jul-1940  DATE OF ADMISSION:  07/23/2010 DATE OF DISCHARGE:  08/05/2010                              DISCHARGE SUMMARY   DISCHARGE DIAGNOSES: 1. Deconditioning past mitral valve repair and maze procedure with     placement of permanent pacemaker and defibrillator. 2. Acute blood loss anemia. 3. History of atrial fibrillation in normal sinus rhythm currently. 4. Congestive heart failure compensated. 5. Hyperactive bladder, symptoms improved.  HISTORY OF PRESENT ILLNESS:  [name] is a 70 year old female with history of nonischemic cardiomyopathy, cardiac arrest in 1996 and nonfunctioning ICD with severe MVR.  She was admitted July 01, 2010, past syncope and 3 minutes of unresponsiveness due to worsening of her arrhythmia vasovagal episode.  The patient was noted to be bradycardic and EP recommended holding the patient's Coreg.  On July 16, 2010, the patient underwent MVR with maze procedure and implantation of permanent pacemaker ICD in abdominal wall by Dr. Cornelius Moras.  Postop has had issues with fluid overload treated with diuretics.  Therapies were initiated and currently the patient is noted to have anxiety with mobility as well as decreased posture.  She is requiring cues to maintain her sternal precautions.  She was evaluated by rehab and we felt that she would benefit from a CIR program.  PAST MEDICAL HISTORY:  Significant for: 1. Hypertension. 2. Congestive heart failure. 3. History of achalasia status post Botox. 4. Obesity. 5. Gait disorder. 6. Cholecystectomy. 7. Nonischemic cardiomyopathy. 8. 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  9. ICD with revision due to infection and  failure.  ALLERGIES:  No known drug allergies.  REVIEW OF SYMPTOMS:  Positive for wound care, weakness, decreased memory.  FAMILY HISTORY:  Positive for cancer.  SOCIAL HISTORY:  The patient is married, lives in 1-level home with three steps at entry.  Does not use any tobacco or alcohol.  Husband is supportive.  FUNCTIONAL HISTORY:  The patient was independent for mobility.  Used a cane out of home.  Does not drive.  FUNCTIONAL STATUS:  The patient is min mod assist transfers, min assist ambulating 65+ 24 feet with rolling walker.  She is mod assist for upper body care, total assist for lower body care.  PHYSICAL EXAMINATION:  VITAL SIGNS:  Blood pressure is 108/60, pulse 70, respirations 18. GENERAL:  The patient is obese female, pleasant, alert and oriented x3. HEENT:  Pupils equal, round, reactive to light.  Oral mucosa is pink and moist with multiple missing teeth and borderline dentition.  Nares patent. NECK:  Supple without JVD or lymphadenopathy. HEART:  Notable for systolic murmur, rate and rhythm regular. ABDOMEN:  Soft, nontender with positive bowel sounds.  Left lower quadrant incision healing well, clean, dry and intact. EXTREMITIES:  Trace to 1+ edema. SKIN:  Chest wall incision is clean, dry and intact.  Prior chest tube sites  healing well.  She does have some irritation in peri area. NEUROLOGIC:  Cranial nerves II-XII within normal limits.  Reflexes 1+. Sensation grossly intact in all 4 limbs.  Strength is 4/5 upper extremity proximal and distal.  In right lower extremity, she is 2-3/5 at hip with flexion, 3 to 3+/5 knee extension and flexion, 4/5 ankle dorsiflexion, plantar flexion.  Left lower extremity she is 3-4/5 proximal and distal.  The patient with some mild processing issues as well as mild memory issues.  Mood is generally pleasant.  HOSPITAL COURSE:  [name] was admitted to rehab on July 23, 2010, for inpatient therapies to consist of  PT, OT at least 3 hours and 5 days a week.  Past admission physiatrist, rehab RN, and therapy team have worked together to provide customized collaborative interdisciplinary care.  The whole team has worked together to help the patient with her high levels of anxiety issues as well as cues for sternal precautions.  Rehab RN has worked with the patient on bowel and bladder program as well as close monitoring of wounds.  The patient's blood pressures were checked on b.i.d. basis during this stay.  These are ranging in 120s to 140s range systolics, 70s to 80s diastolic. Heart rate has been in sinus rhythm and has ranged from 80-90 range. The patient's p.o. intake has been good.  She was maintained on Detrol LA for her hyperactive bladder and this has helped her symptomatology. Routine labs were done past admission for followup on her acute blood loss anemia.  H and H were noted to be at 8.1 and 25.6 and her iron supplement was increased to b.i.d. basis.  Last CBC of July 29, 2010, reveals hemoglobin 9.2, hematocrit 29.6, white count 8.6, platelets 417. The patient is advised to continue on iron supplements b.i.d. basis for the next 1-2 months with followup CBC by primary MD.  Check of lytes done during this stay revealed sodium 138, potassium 3.9, chloride 103, CO2 27, BUN 17, creatinine 1.17, glucose 109.  She was noted to have some worsening of her renal status with elevation of creatinine to 1.36. She will need to recheck lytes done by primary MD in the next few weeks for followup of her renal status.  Pharmacy has followed along to help assist with management of Coumadin.  The patient's PT/INR at the time of discharge is at 26.4 and 2.41 and the patient is discharged on 4 mg Coumadin a day.  Home Health RN to draw pro time on August 06, 2010, with results to Dr. York Spaniel office.  The patient's chest wall incisions have been monitored along.  These are noted to be healing well without  any signs or symptoms of infection.  She was noted to have some superficial dehiscence of one of her lower chest tube sites.  Thoracic Surgery was contacted regarding input on wound management past discharge.  They recommended cleansing this with peroxide on b.i.d. basis and a dry sterile dressing for now.  The patient has had some issues with dry cough which has improved in the last 48 hours.  Daily weights were done to monitor for fluid overload.  This is noted to be on downward trend from 90 kg at admission to 86.3 kg at time of discharge. No complaints of dyspnea or shortness of breath.  During the patient's stay in rehab, weekly team conferences were held to monitor the patient's progress, set goals as well as discuss barriers to discharge.  At the  time of admission, the patient was limited by decreased endurance, decrease in motor strength as well as high levels of anxiety.  She also required cuing for her sternal precautions as well as verbal cues for slow breathing as the patient with increased dyspnea with mobility.  The patient continues to require cues for deep breathing.  Family education was done with husband, with focus on all mobility, stair navigation as well as car transfers.  She continues to require rolling walker for safety.  The patient is discharged to home with further follow-up with home health therapy.  OT has worked with the patient on self-care tasks.  At admission, the patient required max assist for lower body dressing as well as mod  assist for toileting due to overall weakness as well as deconditioning. Currently, the patient has progressed to being at supervision level for bathing and dressing as well as toileting, toilet transfers and tub transfers.  She continues to require increased time as well as  rest breaks to complete her ADL tasks.  Husband and daughter have been educated about supervision needed for self-care tasks and will be able to provide  this. Further follow-up home health OT to continue to help increase activity tolerance and recall of precautions in home management tasks.  Home health RN has also been arranged for protime draws as well as wound monitoring.  On August 05, 2010, the patient is discharged to home.  DISCHARGE MEDICATIONS: 1. Coumadin 4 mg p.o. q.p.m. 2. Folic acid 1 mg p.o. per day. 3. K-Dur 20 mEq p.o. per day. 4. Lasix 40 mg p.o. per day. 5. Zocor 20 at bedtime. 6. Detrol LA 4 mg per day. 7. Tums one p.o. t.i.d. 8. Multivitamin one p.o. per day. 9. Nu-Iron 150 mg p.o. b.i.d. 10.Nystatin powder to inguinal area b.i.d. 11.Protein supplements b.i.d. 12.Flonase 1 squirt to nostril per day. 13.Lopressor 25 mg half p.o. b.i.d. 14.Laxatives as needed for constipation.  DIET:  Heart healthy.  ACTIVITY LEVEL:  24-hour supervision with use of walker, maintain sternal precautions.  WOUND CARE: Cleanse which is daily on chest wall with peroxide b.i.d. and apply dry sterile dressing.  SPECIAL INSTRUCTIONS:  [facility] to provide PT, OT, and RN.  FOLLOWUP:  The patient to follow up with Dr. Riley Kill as needed.  Follow up with Dr. Merri Brunette for routine check including check of CBC and lytes in the next 2-3 weeks.  Follow up with Dr. Viann Fish for routine check.  Follow up with Dr. Cornelius Moras for postop check in 2 weeks.  Delle Reining, P.A.   ______________________________ Ranelle Oyster, M.D.    PL/MEDQ  D:  08/05/2010  T:  08/06/2010  Job:  [id number]  cc:   Georga Hacking, M.D. Dario Guardian, M.D. Salvatore Decent. Cornelius Moras, M.D. Duke Salvia, MD, [facility]  Electronically Signed by Osvaldo Shipper. on 08/12/2010 10:58:22 AM Electronically Signed by Faith Rogue M.D. on 08/25/2010 09:47:14 PM

## 2010-08-25 NOTE — H&P (Signed)
Margaret [name], [name] NO.:  [id number]  MEDICAL RECORD NO.:  [id number]           PATIENT TYPE:  I  LOCATION:  4004                         FACILITY:  [facility]  PHYSICIAN:  Ranelle Oyster, M.D.DATE OF BIRTH:  December 11, 1940  DATE OF ADMISSION:  07/23/2010 DATE OF DISCHARGE:                             HISTORY & PHYSICAL   CHIEF COMPLAINT:  Weakness.  PRIMARY CARE PHYSICIAN:  Dario Guardian, MD  CARDIOLOGIST:  Georga Hacking, MD  SURGEON:  Salvatore Decent. Cornelius Moras, MD  HISTORY OF PRESENT ILLNESS:  This is a pleasant 70 year old female with nonischemic cardiomyopathy and cardiac arrest in 1996 with the apparent anoxic brain injury who had a nonfunctioning ICD and severe mitral valve regurgitation.  She was admitted on July 01, 2010, after syncope with 3 minutes of unresponsiveness due to worsening arrhythmias and vagal episode.  She was seen by EP for her arrhythmia and bradycardia and her Coreg was held and ICD was placed the upper abdominal wall.  On July 15, 2010, she underwent mitral valve replacement with maze procedure and implant of permanent pacemaker and ICD by Dr. Cornelius Moras.  Postoperatively, she had fluid overload and treated with diuretics.  Therapy was initiated.  The patient was noted to have anxiety problems and problems with mobility, posture, and balance.  She needs cues for sternal precautions.  Rehab was asked to evaluate the patient who felt she could benefit from an inpatient stay.  REVIEW OF SYSTEMS:  Notable for the above as well as wound care issues. She denies significant pain.  She has some chronic right leg weakness. Full 12-point review is in the written H and P.  PAST MEDICAL HISTORY:  Positive for the above as well as hypertension, CHF, achalasia, status post Botox, obesity, gait disorder, and cholecystectomy.  FAMILY HISTORY:  Positive for cancer.  SOCIAL HISTORY:  The patient is married, lives in 1-level house, 3 steps to enter.   Does not smoke or drink.  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  ALLERGIES:  None.  HOME MEDICATIONS AND LABORATORY DATA:  Please see written history and physical.  PHYSICAL EXAMINATION:  VITAL SIGNS:  Blood pressure is 108/60, pulse is 70, and respirations 18. GENERAL:  The patient is pleasant, alert, and oriented x3. HEENT:  Pupils are equal, round, and reactive to light.  Ear, nose, and throat exam is notable for missing teeth and borderline dentition as a whole. NECK:  Supple without JVD or lymphadenopathy. HEART:  Notable for systolic murmur.  Rhythm was regular. EXTREMITIES:  Trace to 1+ edema. ABDOMEN:  Soft and nontender. SKIN:  Notable for her surgical site which is clean and intact with no drainage seen.  She has some irritation in the in the peri area. NEUROLOGIC:  Cranial nerves II-XII grossly within normal limits. Reflexes are 1+.  Sensation is grossly intact in all 4 limbs.  Strength was 4/5 in upper extremities, proximal and distal.  Right lower extremity was 2-3/5 at the hip with flexion, 3/5 to 3+/5 in the knee extension and flexion, 4/5 at ankle dorsiflexion and plantarflexion, left  lower extremity she is 3-4/5 proximal and distal.  The patient with some mild processing issues and mild memory issues as well.  Mood is generally pleasant and she was cooperative.  POSTADMISSION PHYSICIAN EVALUATION: 1. Functional deficits secondary to deconditioning after mitral valve     replacement and maze procedure with ICD implantation. 2. The patient is admitted to receive collaborative interdisciplinary     care between the rehab, nursing staff, physiatrist, and therapy     team. 3. The patient's level of medical complexity and substantial therapy     needs in context of that medical necessity cannot be provided at a     lesser intensity of care. 4. The patient has experienced substantial functional loss from her     baseline.  Upon functional  assessment, currently she is min to mod     assist transfer, min assist ambulating 65 plus 25 feet with rolling     walker, mod assist upper body care, total assist lower body care.     Judging by the patient's diagnosis, physical exam, and functional     history, she has potential for functional progress which will     result in measurable gains while in inpatient rehab.  These gains     will be of substantial and practical use upon discharge to home in     facilitating mobility and self-care. 5. Physiatrist will provide 24-hour management of medical needs as     well as oversight of therapy plan/treatment and provide guidance as     appropriate regarding interaction of the two.  Medical problem list     and plan are below. 6. The 24-Hour Rehab Nursing Team will assist in management of the     patient's skin care needs as well as bowel and bladder function,     safety awareness, integration of therapy, concepts, and techniques. 7. PT will assess and treat for lower extremity strength, range of     motion, balance, safety, adaptive equipment, sternal precautions     with goals modified independent to supervision. 8. OT will assess and treat for upper extremity use, ADLs, adaptive     techniques, equipment, functional mobility, safety with goals     modified independent to occasional setup. 9. Speech Language Pathology can see and treat as needed for any     cognitive deficits, although the patient is likely near baseline. 10.Case Management and Social Worker will assess and treat for     psychosocial issues and discharge planning. 11.Team conference will be held weekly to assess progress towards     goals and to determine barriers at discharge. 12.The patient has demonstrated sufficient medical stability and     exercise capacity to tolerate at least 3 hours of therapy per day     at least 5 days a week. 13.Estimate length of stay is 10-12 days.  Prognosis is good.  MEDICAL PROBLEM  LIST AND PLAN: 1. Deep vein thrombosis prophylaxis/anticoagulation with subcu Lovenox     and Coumadin.  Continue Lovenox until INR is greater than 2.  Follow closely for any bleeding sequelae. 2. Pain management:  P.r.n. Tylenol only for now.  This is well     controlled at present.  The patient needs to be wary of the sternal     precautions as well. 3. Congestive heart failure:  We will check daily weights, heart-     healthy diet.  We will watch in's and out's on a regular basis and  follow.  Add protein supplements to boost albumin also. 4. Atrial fibrillation:  Coumadin as above.  Continue low-dose beta-     blocker. 5. Acute blood loss anemia:  Iron will be increased to b.i.d.  Watch patient closely for dizziness or intolerance of activities with PT/OT. 6. Hyperactive bladder:  Detrol LA.  We will toilet q.2-3 h.  She also     wears incontinence pads.     Ranelle Oyster, M.D.     ZTS/MEDQ  D:  07/23/2010  T:  07/24/2010  Job:  [id number]  cc:   Dario Guardian, M.D. Georga Hacking, M.D. Salvatore Decent. Cornelius Moras, M.D.  Electronically Signed by Faith Rogue M.D. on 08/25/2010 09:48:38 PM

## 2010-08-27 ENCOUNTER — Other Ambulatory Visit: Payer: Self-pay | Admitting: Thoracic Surgery (Cardiothoracic Vascular Surgery)

## 2010-08-27 DIAGNOSIS — I059 Rheumatic mitral valve disease, unspecified: Secondary | ICD-10-CM

## 2010-08-30 ENCOUNTER — Encounter (INDEPENDENT_AMBULATORY_CARE_PROVIDER_SITE_OTHER): Payer: Self-pay | Admitting: Thoracic Surgery (Cardiothoracic Vascular Surgery)

## 2010-08-30 ENCOUNTER — Ambulatory Visit
Admission: RE | Admit: 2010-08-30 | Discharge: 2010-08-30 | Disposition: A | Discharge status: Home / Self Care | Payer: Medicare Other | Source: Ambulatory Visit | Attending: Thoracic Surgery (Cardiothoracic Vascular Surgery) | Admitting: Thoracic Surgery (Cardiothoracic Vascular Surgery)

## 2010-08-30 DIAGNOSIS — I059 Rheumatic mitral valve disease, unspecified: Secondary | ICD-10-CM

## 2010-08-30 DIAGNOSIS — I4891 Unspecified atrial fibrillation: Secondary | ICD-10-CM

## 2010-08-30 NOTE — Discharge Summary (Signed)
Margaret [name], Margaret [name] NO.:  [id number]  MEDICAL RECORD NO.:  [id number]           PATIENT TYPE:  I  LOCATION:  2311                         FACILITY:  [facility]  PHYSICIAN:  Salvatore Decent. Cornelius Moras, M.D. DATE OF BIRTH:  1941/03/03  DATE OF ADMISSION:  06/29/2010 DATE OF DISCHARGE:  07/23/2010                              DISCHARGE SUMMARY   PRIMARY ADMITTING DIAGNOSIS:  Syncope.  ADDITIONAL/DISCHARGE DIAGNOSES: 1. Severe mitral regurgitation. 2. Recurrent persistent atrial fibrillation. 3. Symptomatic bradycardia. 4. History of cardiac arrest. 5. Dilated nonischemic cardiomyopathy. 6. History of multiple failed implanted cardiac defibrillators with     retained right ventricular endocardial defibrillator lead. 7. History of anoxic brain injury, status post cardiac arrest in 1996. 8. Obesity. 9. Arthritis. 10.History of achalasia, status post esophageal dilatation. 11.History of chronic/recurrent urinary tract infections, followed by     Dr. Vernie Ammons. 12.Postoperative acute blood loss anemia. 13.Postoperative deconditioning.  PROCEDURES PERFORMED: 1. Median sternotomy for mitral valve repair (complex valvuloplasty     including chordal transposition x1, artificial Gore-Tex chord     replacement x2, 26-mm Sorin Memo 3-D ring annuloplasty). 2. Cox maze procedure, complete biatrial lesion set with clipping of     left atrial appendage. 3. Removal of old right ventricular defibrillator endocardial lead. 4. Placement of dual-chamber pacemaker and implantable cardiac     defibrillator with left ventricular right ventricular and right     atrial epicardial pacemaker leads and epicardial defibrillator     patches (Medtronic Protecta XT DR pacemaker defibrillator.  HISTORY OF PRESENT ILLNESS:  The patient is a 70 year old female with a complicated past medical history including nonischemic cardiomyopathy with a history of previous cardiac arrest, mitral valve prolapse  with severe mitral regurgitation, recurrent persistent atrial fibrillation, and symptomatic bradycardia.  The patient had a previous defibrillator implanted which then required a generator replacement, repositioning procedure, and ultimately erosions of defibrillator in 2006.  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  The patient has gotten along fairly well, but with residual gait disturbance and some short- term memory loss.  She was found to have moderate to severe mitral regurgitation in October 2010 and has been medically managed.  In August 2011, she developed congestive heart failure and cardiac catheterization at that time revealed significant mitral regurgitation and rapid atrial fibrillation which converted chemically to sinus rhythm.  Dr. Cornelius Moras has seen the patient in consultation regarding mitral valve repair.  She underwent repeat catheterization in February which showed an ejection fraction of 30-35% and severe mitral regurgitation.  She was seen in our office in followup on June 28, 2010, and was scheduled to undergo a physical therapy evaluation and was to return for further discussion about the possibility of replacement of defibrillator or epicardial patches at the time of mitral valve surgery.  However, in the interim, she developed weakness and syncope and was brought to the emergency department at [facility] for further evaluation.  HOSPITAL COURSE:  Ms. Derenzo was seen in consultation by Dr. Graciela Husbands for EP Cardiology.  She was noted to have significant bradycardia and her  beta- blocker and amiodarone were discontinued.  He did feel that she would benefit from replacement of her epicardial pacing leads and placement of epicardial patches at the time of her mitral valve surgery.  Dr. Cornelius Moras again saw the patient and ordered a CT of the chest, which was reviewed.  It was agreed that the patient should proceed with  surgery at this time and was scheduled for OR on Friday, July 09, 2010.  All risks, benefits, and alternatives of the surgery were explained to the patient and she agreed to proceed.  It was felt by Cardiology that she should remain in the hospital prior to surgery for further observation in light of her recent syncopal episodes.  She did remain stable and repeat labs were performed including a urinalysis and urine culture which showed greater than 100,000 colonies of gram-negative rods.  The patient had previously been treated for urinary tract infection and a urine culture on this specimen revealed Enterobacter cloacae which was a different organism from her previous UTI.  Because of the need for Foley catheter placement as well as implantation of prosthetic material at the time of surgery, Dr. Cornelius Moras requested a urology consult.  The patient was seen by Dr. Vernie Ammons.  He was familiar with the patient from previous history of recurrent UTIs.  She had undergone a complete workup as an outpatient which showed no anatomic abnormality and a renal ultrasound on July 09, 2010 confirmed this assessment.  He felt that an infectious disease consult would be indicated in light of this history and her current situation.  She was then seen by Dr. Orvan Falconer and it was felt that she should be treated for this acute episode and that no further prophylaxis was indicated.  She concluded an adequate course of antibiotics and was noted to have remained stable.  She was taken to the operating room on July 15, 2010, where she underwent mitral valve repair, maze procedure, ICD lead removal, and placement of an ICD with epicardial patches as described above.  Please see previously dictated operative report for complete details of surgery.  She tolerated the procedure well and no residual mitral regurgitation was noted by TEE intraoperatively.  She was transferred to the ICU in stable condition. She was slowly  allowed to wean from the ventilator and was extubated on postop day #1.  She remained stable and there remained in the unit for further observation.  She was weaned off all pressor agents by postop day #2.  She was started on diuretics for volume overload.  She was slowly started on anticoagulation.  She was seen by Physical Therapy and started working on reconditioning and ambulation.  Her heart rhythm and rate remained stable and she was started on low-dose beta-blocker.  She continued to progress well, but remained very weak from a rehab standpoint.  She was seen in consultation by [facility] and was felt to be a good candidate for further rehabilitation in their unit.  A repeat urine culture was obtained which was negative.  She continued to progress without difficulty.  She has remained afebrile and all vital signs were stable.  She maintained normal sinus rhythm.  She did have a mild acute blood loss anemia which did not require transfusion.  She was seen and evaluated on July 23, 2010, and at that time was deemed ready for discharge in transfer to the [facility].  MEDICATIONS AT THE TIME OF DISCHARGE: 1. Coumadin 5 mg daily or as  directed. 2. Lovenox 30 mg subcu daily.3. Lopressor 12.5 mg b.i.d. 4. Folic acid 1 mg daily. 5. Ferrous sulfate 325 mg daily. 6. Potassium 20 mEq daily. 7. Lasix 40 mg b.i.d.  DISCHARGE INSTRUCTIONS:  She is to continue with PT and OT as directed. She will continue with sternal precautions and no heavy lifting over 10 pounds.  She may shower daily and clean her incisions with soap and water.  She will continue a heart healthy diet.  Discharge followup will be arranged with the patient's discharge home from the [facility].     Coral Ceo, P.A.   ______________________________ Salvatore Decent. Cornelius Moras, M.D.    GC/MEDQ  D:  08/15/2010  T:  08/15/2010  Job:  [id number]  Electronically Signed by Coral Ceo P.A.  on 08/27/2010 02:14:35 PM Electronically Signed by Tressie Stalker M.D. on 08/30/2010 08:39:09 AM

## 2010-08-30 NOTE — Assessment & Plan Note (Signed)
OFFICE VISIT  [name], [name] DOB:  07/14/1940                                        Aug 30, 2010 CHART #:  [id number]  HISTORY OF PRESENT ILLNESS:  The patient returns for routine follow up status post mitral valve repair, maze procedure, and placement of dual- chamber pacemaker and implantable cardiac defibrillator on July 15, 2010.  Her postoperative recovery had been remarkably uncomplicated. Since hospital discharge, she has been followed carefully by Dr. Donnie Aho who continues to adjust her Coumadin and other medications as needed. She was just seen in the office by Dr. Donnie Aho last week.  Overall, she has made exceptionally good progress.  Her physical mobility is still not quite back to her baseline, but otherwise she is doing very well. She has no shortness of breath.  She has had very little if any pain and she has not required any sort of pain relievers.  She has not had any tachy palpitations or dizzy spells.  Her appetite is good.  Bowel function is normal.  She is sleeping well.  She has absolutely no complaints.  Physical therapist continued to work with her and she still seems somewhat reluctant.  She is scared that she might fall, and as such she has been resisting attempts to encourage her to ambulate more. Otherwise, she has been doing very well.  PHYSICAL EXAMINATION:  Well-appearing obese female with blood pressure 146/88, pulse 78, oxygen saturation 96% on room air.  Examination of the chest reveals a median sternotomy incision that is healing nicely.  The sternum is stable on palpation.  Breath sounds are clear to auscultation and symmetrical bilaterally.  No wheezes, rales, or rhonchi are noted. Cardiovascular exam includes regular rate and rhythm.  No murmurs, rubs, or gallops are appreciated.  The surgical incision in the left upper quadrant from placement of pacemaker defibrillator has also healed well, and the pocket is soft,  nontender, and with any sort of associated fluid collection.  The extremities are warm and well perfused.  There is moderate chronic bilateral lower extremity edema which is actually decreased in comparison with her last exam.  The remainder of her physical exam is unremarkable.  DIAGNOSTIC TESTS:  Chest x-ray performed today at the [facility] is reviewed.  This demonstrates clear lung fields bilaterally with very small bilateral pleural effusions.  No other significant abnormalities are noted.  IMPRESSION:  Excellent progress overall.  PLAN:  I have encouraged the patient to continue to really work on her physical mobility and strengthening.  At this point, her only significant physical limitation is that related to continued sternal precautions with care to avoid any sort of heavy lifting or strenuous use of her arms or shoulders.  All questions of the patient and her husband have been addressed.  We will plan to see her back in 3 months' time to make sure she continues to recover.  Salvatore Decent. Cornelius Moras, M.D. Electronically Signed  CHO/MEDQ  D:  08/30/2010  T:  08/30/2010  Job:  [id number]  cc:   Georga Hacking, M.D. Dario Guardian, M.D.

## 2010-09-03 ENCOUNTER — Other Ambulatory Visit: Payer: Self-pay

## 2010-09-03 ENCOUNTER — Ambulatory Visit (INDEPENDENT_AMBULATORY_CARE_PROVIDER_SITE_OTHER): Payer: Medicare Other | Admitting: Physician Assistant

## 2010-09-03 ENCOUNTER — Ambulatory Visit (INDEPENDENT_AMBULATORY_CARE_PROVIDER_SITE_OTHER): Payer: Medicare Other | Admitting: *Deleted

## 2010-09-03 ENCOUNTER — Encounter: Payer: Self-pay | Admitting: Physician Assistant

## 2010-09-03 VITALS — BP 104/72 | HR 76 | Ht 66.0 in | Wt 179.0 lb

## 2010-09-03 DIAGNOSIS — I499 Cardiac arrhythmia, unspecified: Secondary | ICD-10-CM

## 2010-09-03 DIAGNOSIS — I509 Heart failure, unspecified: Secondary | ICD-10-CM

## 2010-09-03 DIAGNOSIS — Z95810 Presence of automatic (implantable) cardiac defibrillator: Secondary | ICD-10-CM

## 2010-09-03 DIAGNOSIS — R0989 Other specified symptoms and signs involving the circulatory and respiratory systems: Secondary | ICD-10-CM

## 2010-09-03 DIAGNOSIS — Z8674 Personal history of sudden cardiac arrest: Secondary | ICD-10-CM

## 2010-09-03 NOTE — Assessment & Plan Note (Signed)
No charge for visit today with me.  I had the EP nurse come and interrogate her device.  We will arrange routine follow up with Dr. Graciela Husbands.  She will follow up with Dr. Donnie Aho as scheduled.

## 2010-09-03 NOTE — Progress Notes (Signed)
Wound/icd check

## 2010-09-03 NOTE — Patient Instructions (Signed)
Your physician recommends that you schedule a follow-up appointment in: 2 MONTHS WITH DR. Graciela Husbands ASPER SCOTT WEAVER, PA-C; PT SHOULD ONLY BE SET UP ON DR. KLEIN SCHEDULE, THIS IS A DR. TILLEY PT.

## 2010-09-03 NOTE — Progress Notes (Signed)
History of Present Illness: Primary Electrophysiologist:  Dr. Sherryl Manges Primary Cardiologist:  Dr. Nino Parsley  Margaret [name] is a 70 y.o. female With a complex past medical history which includes a ventricular fibrillation arrest in the 1990s, nonischemic cardiomyopathy, severe mitral regurgitation and atrial fibrillation.  She had an ICD implanted with subsequent generator change out that was complicated by an erosion.  She had bilateral subclavian vein stenosis and could not have reimplantation.  She was to be set up for mitral valve repair.  However, she was admitted 3/8 with near syncope.  Dr. Graciela Husbands evaluated her for bradycardia and recommended reimplantation of a CRT-D at the time of her heart surgery.  Her course was complicated by recurrent urinary tract infections which postponed her surgery.  She eventually had a mitral valve repair on 3/23 with Dr. Cornelius Moras.  She also had a Cox-Maze procedure with left atrial appendage clipping, removal of her RV lead and implantation of an LV, RV and RA lead with placement of the CRT-deep device in her abdomen.  She spent 2 weeks in inpatient rehabilitation.  She was inadvertently placed on my schedule today for follow up of her device.  I had her device interrogated with the EP clinic.  She has no complaints.  Her device site in her left lower abdomen appears stable without erythema or discharge.  Her device is functioning appropriately.  She will be brought back to see Dr. Graciela Husbands in 2 months for routine follow up.  She continues to follow up closely with Dr. Donnie Aho.  Past Medical History  Diagnosis Date  . NICM (nonischemic cardiomyopathy)     EF 35%; cath 2/12 no CAD  . Systolic CHF, chronic   . Severe mitral regurgitation     s/p complex mitral valve repair with cox maze + LAA clipping, removal of RV lead, and implantation of CRT-D in abdomen  . Cardiac arrest - ventricular fibrillation     in 1990s  . Atrial fibrillation     amiodarone  . HTN  (hypertension)   . HLD (hyperlipidemia)   . Achalasia     s/p dilation  . Recurrent UTI     Current Outpatient Prescriptions  Medication Sig Dispense Refill  . Cholecalciferol (VITAMIN D3) 2000 UNITS TABS Take by mouth 2 (two) times daily.        Marland Kitchen CLARINEX 5 MG tablet Take 1 tablet by mouth daily.      Marland Kitchen DETROL LA 4 MG 24 hr capsule Take 1 tablet by mouth daily.      . fluticasone (FLONASE) 50 MCG/ACT nasal spray 1 drop by Nasal route daily.      . folic acid (FOLVITE) 1 MG tablet Take 1 tablet by mouth daily.      . furosemide (LASIX) 40 MG tablet Take 1 tablet by mouth daily.      Marland Kitchen KLOR-CON M20 20 MEQ tablet Take 1 tablet by mouth daily.      Marland Kitchen lovastatin (MEVACOR) 40 MG tablet Take 1 tablet by mouth daily.      . metoprolol tartrate (LOPRESSOR) 25 MG tablet Take 0.5 tablets by mouth Twice daily.      . Polysaccharide Iron Complex (FERREX 150 PO) Take by mouth 2 (two) times daily.        Marland Kitchen warfarin (COUMADIN) 4 MG tablet Take 1 tablet by mouth as directed.        Allergies: No Known Allergies  Vital Signs: BP 104/72  Pulse 76  Ht 5'  6" (1.676 m)  Wt 179 lb (81.194 kg)  BMI 28.89 kg/m2  PHYSICAL EXAM: Well nourished, well developed, in no acute distress Left lower abdomen ICD site without erythema or discharge.   ASSESSMENT AND PLAN:

## 2010-09-07 NOTE — Assessment & Plan Note (Signed)
OFFICE VISIT   Margaret, [name]  DOB:  07-09-1940                                        May 10, 2010  CHART #:  [id number]   HISTORY OF PRESENT ILLNESS:  The patient returns for followup of mitral  regurgitation and atrial fibrillation.  She was originally seen in  consultation on April 15, 2010, at which time, she was hospitalized  at [facility].  A full consultation note was dictated  at that time.  During that hospitalization, the patient underwent DC  cardioversion for atrial fibrillation.  She had been admitted to the  hospital in class IV congestive heart failure with rapid atrial  fibrillation and pulmonary edema.  She improved on medical therapy.  Since hospital discharge, she has remained clinically stable.  She was  seen in followup by Dr. Donnie [name] 10 days ago.  She returns to our office  for further followup today to further discuss whether or not to consider  elective surgical intervention.  She and her husband both note that  since she has been home, she has remained stable.  She has not had any  further episodes of tachy palpitations or shortness of breath.  The  remainder of her review of systems is unchanged from previously.   Current medications include:  1. Lasix 40 mg daily.  2. Amiodarone 200 mg daily.  3. Carvedilol 3.125 mg twice daily.  4. Tums tablets twice daily.  5. Flonase inhaler as needed.  6. Iron sulfate 1 tablet daily.  7. Lovastatin 40 mg daily.  8. Lisinopril 40 mg daily.  9. Clarinex 5 mg daily as needed.  10.Warfarin 5 mg alternating with 2.5 mg daily.  11.Potassium chloride 20 mEq daily.   PHYSICAL EXAMINATION:  General:  Notable for well-appearing obese female  who appears her stated age, in no acute distress.  Vital Signs:  Blood  pressure 146/81, pulse 84 and regular, oxygen saturation 99% on room  air.  Chest:  Auscultation of the chest reveals clear breath sounds that  are symmetrical  bilaterally.  No wheezes, rales or rhonchi are noted.  Cardiovascular:  Notable for regular rate and rhythm.  Grade 2/6 murmur  can be appreciated at the left ventricular apex.  Abdomen:  Soft and  nontender.  Extremities:  Warm and adequately perfused.  There is mild  bilateral lower extremity edema.   IMPRESSION:  Mitral regurgitation with recent episode of rapid atrial  fibrillation complicated by class IV congestive heart failure with  pulmonary edema.  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  In particular, the  patient has history of dilated nonischemic cardiomyopathy.  She has  mitral annular dilatation.  She also has a small area of prolapse  involving what appears to be a commissural leaflet of valve and her  mitral regurgitation is directed eccentrically around the atrium.  It  was somewhat difficult to quantify during recent transesophageal  echocardiogram, although there was no clear evidence for reversal of  flow on the pulmonary veins.  However, at the time of cardiac  catheterization in August 2011, there were large V-waves noted on right  heart catheterization consistent with severe mitral regurgitation  despite relatively normal pulmonary artery pressures.  Under the  circumstances, I think it  would be reasonable to consider surgical  intervention for elective mitral valve repair and a Maze procedure.  Risks of surgery will be somewhat increased due to the patient's  underlying cardiomyopathy with baseline left ventricular dysfunction as  well as her limited physical mobility and other comorbid medical  problems.  Nevertheless, the risks of surgery do not appear to be  extremely high, and I would certainly rather intervene before her left  ventricular dysfunction deteriorates further.  Alternatives at this time  include proceeding with elective surgical intervention in the near  future versus continued very  close medical followup.  If the patient has  any further episodes of atrial fibrillation and or symptoms of  congestive heart failure, I think we should strongly consider  intervening sooner rather than later.  If her left ventricular function  is allowed to deteriorate substantially, we may lose an opportunity to  help her long-term prognosis.   PLAN:  I have discussed matters at length with the patient and her  husband here in the office today.  They want to think matters over and  they specifically asked that Dr. Donnie [name] and I discuss things together.  I will contact Dr. Donnie [name] in the near future, and we will formulate a  long-term plan.  All their questions have been addressed.   Margaret [name], M.D.  Electronically Signed   CHO/MEDQ  D:  05/10/2010  T:  05/11/2010  Job:  [id number]   cc:   Margaret [name], M.D.  Margaret [name], M.D.

## 2010-09-10 NOTE — Discharge Summary (Signed)
NAMEJONITA, Margaret [name] NO.:  [id number]   MEDICAL RECORD NO.:  [id number]          PATIENT TYPE:  INP   LOCATION:  3712                         FACILITY:  [facility]   PHYSICIAN:  Willa Rough, M.D.     DATE OF BIRTH:  Aug 21, 1940   DATE OF ADMISSION:  09/17/2004  DATE OF DISCHARGE:  09/21/2004                                 DISCHARGE SUMMARY   PRIMARY CARE PHYSICIAN:  Dario Guardian, M.D.   CARDIOLOGIST:  Georga Hacking, M.D.   INFECTIOUS DISEASE SPECIALIST:  Burnice Logan, M.D.   DISCHARGE DIAGNOSES:  1. Admitted with cardioverter defibrillator pocket breakdown with obvious      device exposure.  2. Blood cultures x2 taken Sep 17, 2004 were negative at the time of      discharge Sep 21, 2004.  3. Wound culture taken Sep 17, 2004 showing a few Staphylococcus coag      negative organisms.     SECONDARY DIAGNOSES:  1. Nonischemic cardiomyopathy on chronic Coumadin therapy.  2. History of sudden cardiac death in 08-07-1996requiring out-of-hospital      resuscitation, and in a coma for 16 days, status post implantation of      cardioverted defibrillator during that hospitalization.  3. Status post ICD generator change in April 2003.  4. History of ICD displacement in June 2005 with revision and lead repair      in 07-Aug-2005with discharge of brownish thin fluid, culture negative at      that time.  5. Short-term memory deficit.     PROCEDURE:  Sep 21, 2004 - Inside removal of cardioverted defibrillator  generator with leads capped and wound packed and bandaged, Dr. Lewayne Bunting.   DISCHARGE DISPOSITION:  [name] has been admitted to [facility] for the past 5 days.  She was admitted with obvious wound  dehiscence at the site of implantation of cardioverter defibrillator, and  has been admitted for observation.  She has had no significant drainage from  the incision sites.  She has been afebrile this hospitalization.  She has  been  seen by infectious disease, whose recommendation was that at the time  of lead explantation that the wound would be cultured, and that the patient  would be started on vancomycin until specific culture results were  available.  The patient will present on Friday, September 24, 2004, for lead  extraction to be done in the operating room with cardiovascular thoracic  surgeon back up.  She will also have 2 units of packed red blood cells  ready.   BRIEF HISTORY:  [name] is a 71 year old female.  She has a history of  __________ sudden cardiac death.  At that time, she received a cardioverter  defibrillator.  She has had some migraine of her ICD, and the pocket has  been revised.  This was done in Nov 30, 2003.  She was seen in the primary care  office, and it was noted that the pocket surrounding her ICD had actually  broken down, and part of the device was extruding  from the pocket.  The  patient has no pain.  She has no fever.  She has presented in the emergency  room at [facility] for further evaluation.  The patient will be  admitted.  An infectious disease consultation will be obtained.  Wound care  will be provided.  Her Coumadin will be held, and she will be started on IV  heparin.   HOSPITAL COURSE:  [name] was admitted to [facility] on  Sep 17, 2004 after noting that morning that her cardioverter defibrillator  pocket had actually opened, and the device was protruding.  She presented to  her primary care giver, who referred her to the emergency room at [facility], where she was admitted by Dr. Willa Rough, the on-call cardiologist.  She was placed on IV heparin.  Wound care was provided.  She was seen by  infectious disease specialists, and subsequently by Dr. [name].  Dr. [name]  removed the device actually on Sep 21, 2004 by the bedside, repacked the  wound carefully, and bandaged it.  The leads were still in place.  It was  his intention that she go home  for 2 days, return to [facility] on  September 24, 2004.  She does not have to be on IV antibiotics.  She will be given  Lovenox coverage during her 2 day sabbatical from [facility].  She  will present September 24, 2004 for a lead extraction in the operating room.   DISCHARGE MEDICATIONS:  1. Lovenox 60 mg injected subcutaneous Wednesday in the morning, Wednesday      in the evening, and Thursday in the morning on Sep 22, 2004 and September 23, 2004.  2. Lovastatin 40 mg daily at bedtime.  3. Clarinex 5 mg daily.  4. Lanoxin 0.25 mg daily.  5. Ditropan XL 15 mg at bedtime daily.  6. Lisinopril 30 mg daily.  7. She is to hold off on taking Coumadin.  8. Coreg 6.25 mg twice daily.     LABORATORY DATA:  On the day of discharge, her complete blood count revealed  white cells 5.5, hemoglobin 11.2, hematocrit 33.9, and platelets 213.  Her  INR on the day of discharge was 1.4.  Serum electrolytes on Sep 17, 2004  revealed a sodium of 138, potassium 4.8, chloride 109, bicarbonate 23,  glucose 106, BUN 16, creatinine 0.8.  Blood cultures x2 on Sep 17, 2004 were  negative at the time of discharge.  Wound culture on Sep 17, 2004 showed a  few Staphylococcus coag negative species.  TSH this admission was 0.885.  Her alkaline phosphatase was 71, SGOT 47, SGPT 20.   She is to return to [facility] short stay on Friday, September 24, 2004,  at about 11 o'clock.  She is asked to eat nothing after midnight, Thursday,  September 23, 2004.  She may have clear liquids until 9 o'clock Friday morning.      GM/MEDQ  D:  09/21/2004  T:  09/21/2004  Job:  [id number]   cc:   Dario Guardian, M.D.  [address]  [place]  [place] [address]  Fax: [phone]   W. Ashley Royalty., M.D.  [address], [address]  [place]  [place] [address]  Fax: [phone]   [facility], [facility], M.D.  Infectious Disease

## 2010-09-10 NOTE — Op Note (Signed)
[name], Margaret [name] NO.:  [id number]   MEDICAL RECORD NO.:  [id number]          PATIENT TYPE:  INP   LOCATION:  6527                         FACILITY:  [facility]   PHYSICIAN:  Duke Salvia, M.D.  DATE OF BIRTH:  [birth year]   DATE OF PROCEDURE:  10/29/2003  DATE OF DISCHARGE:  10/31/2003                                 OPERATIVE REPORT   PREOPERATIVE DIAGNOSIS:  Device migration with anticipated pocket revision.   POSTOPERATIVE DIAGNOSIS:  Device migration with anticipated pocket revision  with possible device infection.   DESCRIPTION OF PROCEDURE:  Following attainment of informed consent, the  patient was brought to the electrophysiology laboratory and placed on  fluoroscopic table in the supine position.  After routine prep and drape,  lidocaine was infiltrated along the line of the previous incision and  carried downward to the layer of the device pocket using sharp dissection.  The pocket was opened.  An opaque, serosanguineous, nonmalodorous fluid was  expressed.  Cultures were obtained and sent.   The device was moved medially and secured in a pocket that expands the  pectoral muscle.  The wound was then closed in three layers in a normal  fashion.  The wound was washed and dried and a Benzoin Steri-Strip dressing  was applied.  The needle counts, sponge counts and instrument counts were  correct at the end of the procedure, according to the staff.       SCK/MEDQ  D:  03/10/2004  T:  03/11/2004  Job:  [id number]   cc:   [name]., M.D.  [address]  [place]  [place] [address]  Fax: [phone]   [facility]   Electrophysiology Laboratory   [place]

## 2010-09-10 NOTE — Discharge Summary (Signed)
NAMEJOSEPHENE, [name] NO.:  [id number]   MEDICAL RECORD NO.:  [id number]          PATIENT TYPE:  OIB   LOCATION:  3738                         FACILITY:  [facility]   PHYSICIAN:  Doylene Canning. [name], M.D.  DATE OF BIRTH:  August 21, 1940   DATE OF ADMISSION:  09/24/2004  DATE OF DISCHARGE:  09/30/2004                                 DISCHARGE SUMMARY   DISCHARGE DIAGNOSES:  1.  Chronic cardioverter defibrillator pocket infection:  Wound culture Sep 17, 2004, showing few Staphylococcus coagulase negative SAB.  Blood      culture September 26, 2004, no growth as of September 28, 2004.  Blood culture Sep 17, 2004, no growth times five days.  2.  Discharge day #6 status post bedside removal of cardioverter      defibrillator device completely eroded through the skin.  3.  Discharge day #6 status post attempted lead removal in the emergency      room with breakage at the proximal coil.  Patient will receive wound      care, IV antibiotics and surveillance blood cultures.  4.  Discharged September 30, 2004, after a sterile field, incision and drainage at      the former drain site, placed at the time of attempted lead removal,      with exploration of the existing subcutaneous former cardioverter      defibrillator pocket as subsequent packing with 1/2 inch sterile saline      moistened gauze ribbon.   SECONDARY DIAGNOSES:  1.  History of sudden cardiac death status post cardioverter defibrillator      implantation 1996 with generator change in June, 2005.  2.  Revision of implantable cardioverter defibrillator pocket July, 2005.  3.  Nonischemic cardiomyopathy.  4.  Short term memory deficit status post anoxic brain injury.  5.  Dyslipidemia.  6.  Chronic Coumadin.   PROCEDURE:  1.  September 24, 2004, bedside removal of cardioverter defibrillator device      completely eroded through the skin.  2.  September 24, 2004, attempted lead removal in OR with cardiovascular thoracic      surgeon  backup by Dr. Lewayne Bunting with breakage at the proximal coil.  3.  September 30, 2004, sterile field incision and drainage at former drain exit      site.  Exploration of the existing subcutaneous former ICD pocket and      subsequent packing with 1/2 inch moistened gauze ribbon.   PLAN:  1.  Home Health nursing to administer IV vancomycin 1 g IV q.12 hours, this      to continue for the next 6 weeks.  2.  Wound care with packing of subcutaneous cardioverter defibrillator      pocket with 1/2 inch sterile gauze ribbon moistened with sterile normal      saline and dressing changes twice a day or as needed.  3.  Office visit with Dr. Lewayne Bunting Wednesday, October 06, 2004, for wound      check and suture removal at 10:15 in  the morning.  4.  Followup INR and PT with Dr. York Spaniel office Friday, October 01, 2004, or      Monday, October 04, 2004.  The patient's family will call to make that      appointment.  5.  To see Dr. Ninetta Lights, Infectious Disease specialist, Friday, November 11, 2004, at 11:15.  At that time, 6 weeks of IV antibiotics will have been      delivered and the patient will start weekly blood cultures for      surveillance.  6.  The patient is to wear the life vest for the next 3 months.  7.  Consideration will be given to reimplantation in about 12 weeks from      discharge.   DISCHARGE DISPOSITION:  Patient discharging September 30, 2004.  She has  maintained sinus rhythm, she is afebrile.  Blood cultures are as mentioned  above.  The subcutaneous pocket was entered with a careful incision through  the former drain site in the lateral area just prior to the left axilla.  Serosanguineous fluid was expressed and packing was placed into a fairly  large sized pocket.  After the packing was placed and dressing in place,  there was no evidence after 1 hour of drainage through the dressing.   Patient discharging September 30, 2004, with the following medications:  1.  IV vancomycin 1 g every 12  hours.  2.  Coreg 3.125 mg twice daily.  3.  Digoxin 0.25 mg daily.  4.  Claritin 5 mg daily.  5.  Lovastatin 40 mg daily at bedtime.  6.  Lisinopril 30 mg daily.  7.  Ditropan XL 15 mg at bedtime.  8.  Coumadin as before her hospitalization.   Complete blood count on Sep 21, 2004:  White cells 5.5, hemoglobin 11.2,  hematocrit 33.9, platelets 213.  Urine culture on September 25, 2004, no growth x1  day.  Blood culture September 26, 2004, no growth reported to date from June 4-8,  2006.  Blood culture on Sep 17, 2004, no growth x5 days.  Serum electrolytes  on September 28, 2004:  Sodium 137, potassium 3.7, chloride 106, bicarbonate 24,  glucose 116, BUN 16, creatinine 0.9.   BRIEF HISTORY:  [name] is a 70 year old female.  She has a history of  sudden cardiac death.  At that time, she received a cardioverter  defibrillator.  She has had some migration of her cardioverter defibrillator  and the pocket was revised in July, 2005.  She was seen recently in her  primary care physician's office and it was noted there that the pocket  surrounding her defibrillator had actually broken down and that part of the  device was extruding from the pocket; this was on Sep 17, 2004.  The patient  was admitted to [facility] through the Emergency Room for further  evaluation.  Blood cultures were taken at that time and have been negative.  The patient was then admitted and an Infectious Disease consult obtained.  Wound care would be provided.  Coumadin would be held and she will be  started on IV heparin.  When the blood cultures were shown to be negative,  the patient's cardioverter defibrillator wound area was carefully bandaged  and the patient was discharged on Sep 21, 2004, to return September 24, 2004, for  cardioverter defibrillator removal and led extraction.   HOSPITAL COURSE:  The patient presented September 24, 2004, electively to [facility].  At bedside, the protruding cardioverter generator was   removed under sterile conditions and the patient was schedule for in-  operating room lead removal.  This proved to be difficult and the lead broke  at the proximal coil, __________ further attempts at lead extraction.  Patient has been maintained on IV vancomycin throughout this hospitalization  and the cardioverter defibrillator pocket carefully monitored for any sign  of erythema or drainage.  The incision, which was closed with vertical  mattress sutures, is actually healing very well and on challenge September 29, 2004, proved recalcitrant to reopening with a Q-tip swab.  A wound care  consult was obtained and the suggestion was made that perhaps entry could be  through the former drain site placed September 24, 2004.  This was attempted just  prior to the patient's discharge and a small amount of serosanguineous,  about 30 mL, fluid drained out.  This whole area, the whole subcutaneous  pocket area, still persisted and was packed carefully with 1/2 inch sterile  gauze moistened in sterile saline.  Throughout this hospitalization the  patient was monitored by Infectious Disease specialist, Dr. Ninetta Lights, and his  colleagues, and, in communication with Dr. Graciela Husbands, plan has been formulated  for her post discharge convalescence.  She will have Home Health to  administer IV vancomycin every 12 hours for the next 6 weeks.  She will have  Home Health wound care to pack the wound on a daily basis with 1/2 inch  gauze and she will have several followup visits both with Dr. Ninetta Lights and  with Dr. [name] as mentioned above.  Sutures will be removed on Wednesday,  October 06, 2004, and the wound checked.  Dr. Ninetta Lights will see this patient  November 01, 2004.  At that time, IV vancomycin will  have run the 6 week course and blood culture surveillance could continue.  She is wearing a life vest at discharge, which is an external cardioverter  defibrillator, and reimplantation, if cultures are negative, could be  planned  as early as 12 weeks from date of discharge.      Debbora Lacrosse   GM/MEDQ  D:  09/30/2004  T:  09/30/2004  Job:  [id number]   cc:   Doylene Canning. [name], M.D.   Lacretia Leigh. Ninetta Lights, M.D.  [address]  [place]  [place] [address]  Fax: [phone]   Dario Guardian, M.D.  [address]  [place]  [place] [address]  Fax: [phone]   W. Ashley Royalty., M.D.  [address], [address]  [place]  [place] [address]  Fax: [phone]

## 2010-09-10 NOTE — Discharge Summary (Signed)
Margaret [name], Margaret [name] NO.:  [id number]   MEDICAL RECORD NO.:  [id number]          PATIENT TYPE:  INP   LOCATION:  6527                         FACILITY:  [facility]   PHYSICIAN:  Duke Salvia, M.D.  DATE OF BIRTH:  03/25/1941   DATE OF ADMISSION:  10/29/2003  DATE OF DISCHARGE:  10/31/2003                                 DISCHARGE SUMMARY   PRIMARY CARE PHYSICIAN:  Georga Hacking, M.D.   CARDIOLOGIST:  Duke Salvia, M.D.   DISCHARGE DIAGNOSIS:  Displacement of a previously implanted subpectoral  defibrillator into the axillary space with overlying erythema, probable  device infection.   OPERATION PERFORMED:  Pocket revision with lead repair on October 29, 2003.   PAST MEDICAL HISTORY:  1.  Implantable cardioverter defibrillator June 2005.  2.  Status post aborted, sudden cardiac death.  3.  Nonischemic cardiomyopathy.   HISTORY OF PRESENT ILLNESS:  Ms. Uriarte was sent to see Dr. Graciela Husbands on the day  prior to admission by Dr. Donnie Aho with request to look at her incision  pocket.  The patient had been doing something with her arms over her head  when she noticed a significant discomfort in her defibrillator had migrated  into the axillary space.  It was quite tender with overlying redness.  Dr.  Graciela Husbands felt the patient needed to be admitted to explore the pocket and  reposition the device and treat with IV antibiotics to rule out infection  after cultures were done.   HOSPITAL COURSE:  The patient admitted under the care of Dr. Graciela Husbands for  pocket revision.  Her medications for home were continued including  Coumadin, Lanoxin, Lovastatin, Ditropan, Flonase, Clarinex, multivitamins,  lisinopril, Coreg.  Cardiology p.r.n. order, she was given Ancef on call and  then vancomycin per pharmacy post procedure.  The patient tolerated the  procedure without any complications.  Per Dr. Graciela Husbands, cultures were done.  Pocket preliminary report was negative.  The patient was  discharged home  with instructions to continue same medications as before procedure, also  Keflex 500 mg one q.i.d. for five days.  She is to remove the outer layer  bandage in the morning, leave tape in place.  Do not get incision wet for  one week.  Do not raise arm above head for one week.  She is instructed to  use Tylenol for general discomfort.   Follow a low fat, cholesterol and salt diet.   She has an appointment at the pacer clinic at [facility] on November 12, 2003, at  9:15 and an appointment with Dr. Graciela Husbands on February 04, 2004, at 2:40.       MB/MEDQ  D:  03/16/2004  T:  03/16/2004  Job:  [id number]   cc:   [name]., M.D.  [address]  [place]  [place] [address]  Fax: [phone]

## 2010-09-10 NOTE — Op Note (Signed)
NAME:  Margaret [name], Margaret [name]                       ACCOUNT NO.:  [id number]   MEDICAL RECORD NO.:  [id number]                   PATIENT TYPE:  OIB   LOCATION:  2866                                 FACILITY:  [facility]   PHYSICIAN:  Duke Salvia, M.D.               DATE OF BIRTH:  1941-02-04   DATE OF PROCEDURE:  10/29/2003  DATE OF DISCHARGE:                                 OPERATIVE REPORT   PREOPERATIVE DIAGNOSIS:  Displacement of a previously implanted subpectoral  defibrillator into the axillary space; overlying erythema.   POSTOPERATIVE DIAGNOSIS:  Displacement of a previously implanted subpectoral  defibrillator into the axillary space; overlying erythema.  Probable device  infection.   OPERATION PERFORMED:  Pocket revision, lead repair.   SURGEON:  Duke Salvia, M.D.   DESCRIPTION OF PROCEDURE:  Following the obtaining of informed consent, the  patient was brought to the electrophysiology laboratory and placed on the  fluoroscopic table in supine position.  After routine prep and drape of the  left upper chest, lidocaine was infiltrated along the line of the previous  incision and carried down to the layer of the pectoral fascia using sharp  dissection and electrocautery.  The pectoralis muscle was then gradually and  carefully separated and finally allowed after a great deal of effort,  opening of the defibrillator pocket which was encompassed in scar.  Upon  opening of the pocket, there was drainage of a yellow-Tweed liquid material  which was cultured times three.  The pocket was opened finally and the  device was explanted.  The device was still anchored to the submuscular area  and my guess is that the displacement of the device is not new.  I am not  quite sure why it is that it occurred given the anchoring suture being in  place unless it was just simply rotation of the device around the anchoring  suture.  There may also have been some release of fluid under pressure  that  caused the pain and subsequent improvement in pain.   I elected after culturing the device, to place the device in a subpectoral  prepectoral combination pocket which allowed me to move it more medially  without a great deal of further dissection.  The wound was then closed in  three layers in the normal fashion.  The wound was washed, dried and benzoin  and Steri-Strip dressing was applied.  Sponge, needle and instrument counts  were correct at the end of the procedure according to the staff.  The  patient will be admitted, intravenous antibiotics will be started and we  will await cultures.                                               Duke Salvia, M.D.  SCK/MEDQ  D:  10/29/2003  T:  10/30/2003  Job:  [id number]   cc:   [name]., M.D.  [address]  [place]  [place] [address]  Fax: [phone]

## 2010-09-10 NOTE — Op Note (Signed)
Margaret [name], GUIN NO.:  [id number]   MEDICAL RECORD NO.:  [id number]          PATIENT TYPE:  OIB   LOCATION:  3738                         FACILITY:  [facility]   PHYSICIAN:  Doylene Canning. [name], M.D.  DATE OF BIRTH:  09-11-1940   DATE OF PROCEDURE:  09/24/2004  DATE OF DISCHARGE:                                 OPERATIVE REPORT   PROCEDURE PERFORMED:  ICD lead and generator extraction.   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 The patient had ICD generator changed out in 2003. In  2005, she had dislodgement and migration of the patient's subpectoral pocket  into the area under the axilla. This pocket was revised and at the time of  the revision, fluid was found in the pocket but no germs were cultured out.  The patient then was stable but over the last 2 weeks, it was noted that her  ICD pocket has gradually become noticeable through the skin. It was actually  hanging out from her scan in total. It should also be noted that this tissue  under the skin over the previous ICD implantation site was hard and  thickened. She is now referred for lead and generator extraction.   PROCEDURE PERFORMED:  After informed consent was obtained, the patient was  taken to the operating room in the fasting state. After usual preparation  and draping, a total of 40 mL of lidocaine was infiltrated over the old ICD  incision site. A 12 cm incision was carried out over this site and  electrocautery utilized to dissect down to the ICD leads. It should be noted  that there was between 6-8 cm of scar tissue fixed between the skin and the  generator and the leads, themselves. The generator has been previously  removed. Eventually, after approximately 1 hour of Bovie  dissection, the ICD  lead was freed up from all its fibrous adhesions. At this point, attention  was turned toward removal of the lead from the vascular space. A clearing  stylet was advanced down the lead. Then a locking stylet was advanced all  way down the lead and locked in place. A stainless steel sheath was advanced  over the lead. There was difficulty entering the vascular space and, in  fact, the avascular space was never entered during the procedure. With the  stainless steel having difficulty entering the vascular space (extensive  calcification over the lead), the EDS sheath was utilized to try to advance  over the vascular space. This, again, resulted in no significant ability to  traverse the vascular space. After a period traction and countertraction,  pressure and counter pressure, the defibrillator lead broke. At this point,  discussion was made with Dr. Tyrone Sage about whether to proceed with  extraction of the lead using the femoral approach versus consideration for a  period of watchful waiting, and if the  patient developed a nonhealing wound  in her chest or if the patient developed bacteremia, then the plan would be  to proceed with lead extraction through an open thoracotomy approach. At  this point, the old open skin was freed of necrotic tissue, and the skin was  excised widely. This incision was sewn up with six simple Prolene mattress  sutures. Finally, the previous incision was also sewn up with simple  mattress Prolene suture. A Jackson-Pratt drain was placed. The patient was  returned to the holding area in stable condition. There were no immediate  procedure complications.   RESULTS:  This demonstrates unsuccessful total removal of the patient's  previously implanted ICD lead and generator. There was a portion of the lead  from the proximal coil down to the tip left in the vascular space. It  appeared that the vascular space had never been accessed during the   procedure secondary to severe dense fibrous adhesions.      GWT/MEDQ  D:  09/24/2004  T:  09/25/2004  Job:  [id number]   cc:   Duke Salvia, M.D.   [name]., M.D.  [address], [address]  [place]  [place] [address]  Fax: [phone]

## 2010-09-10 NOTE — Op Note (Signed)
[name]  Patient:    Margaret [name], Margaret [name] Visit Number: [id number] MRN: [id number]          Service Type: CAT Location: [facility] 2854 [id number] Attending Physician:  Nathen May Dictated by:   Nathen May, M.D., [facility] Proc. Date: 08/02/01 Admit Date:  08/02/2001   CC:         [name]., M.D.  Meredith Staggers, M.D.  Electrophysiology Laboratory  [place] Attn: Device Clinic   Operative Report  PREOPERATIVE DIAGNOSIS:  Prior cardiac arrest, status post implantable cardioverter-defibrillator implantation with intercurrent rapid tachycardias without device therapy.  POSTOPERATIVE DIAGNOSIS:  Prior cardiac arrest, status post implantable cardioverter-defibrillator implantation with intercurrent rapid tachycardias without device therapy.  PROCEDURES:  Defibrillator generator explantation and reimplantation; and defibrillator generator and lead assessment.  DESCRIPTION OF PROCEDURE:  Following the obtainment of informed consent, the patient was brought to the electrophysiology laboratory and placed on the fluoroscopic table in the supine position.  After routine prep and drape of the left upper chest, lidocaine was infiltrated along the line of the previous incision and carried down to the layer of the pectoral muscle using sharp dissection and electrocautery.  The pectoralis muscle was then opened as it overlaid the defibrillator and it gradually separated.  Eventually, we were able to get to the device, which had migrated laterally and caudally from its previous location.  The device was then explanted.  The R-wave was assessed and it measured on the Guidant 0125, serial number [id number] previously implanted lead was 12.2 mV with an impedance of 443 ohms, a pacing threshold of 2.1 volts at 0.5 msec.  The proximal coil impedance was 465 and the distal coil impedance was 456.  With these acceptable  parameters recorded, the lead was then attached to a Guidant model 1860 Prizm VR-ICD, serial number [id number].  Through the device, the R-wave was 8.5 mV with an impedance of 459 ohms and a threshold of 2.2 volts at 0.5 msec.  With these acceptable parameters recorded, defibrillation threshold testing was undertaken.  The pocket was copiously irrigated with antibiotic-containing saline solution.  Hemostasis was assured, the lead and the pulse generator were then placed back in the pocket, and now secured to the cephalad portion of the pocket.  Ventricular fibrillation was induced via the T-wave shock. After a total duration of 7 seconds, a 14-joule shock was delivered through a measured resistance of 37 ohms terminating ventricular fibrillation and restoring sinus rhythm.  With these acceptable parameters recorded, the system was implanted.  The pectoral muscles were lightly apposed.  The wound was then closed in three layers in the normal fashion.  The wound was washed, dried and a benzoin, Steri-Strips dressing was then applied.  Needle counts and sponge counts and instrument counts were correct at the end of the procedure according to the staff. Dictated by:   Nathen May, M.D., [facility] Attending Physician:  Nathen May DD:  08/02/01 TD:  08/02/01 Job: [id number]

## 2010-09-10 NOTE — H&P (Signed)
[name], Margaret NO.:  [id number]   MEDICAL RECORD NO.:  [id number]          PATIENT TYPE:  INP   LOCATION:  1824                         FACILITY:  [facility]   PHYSICIAN:  Willa Rough, M.D.     DATE OF BIRTH:  [birth year]   DATE OF ADMISSION:  09/17/2004  DATE OF DISCHARGE:                                HISTORY & PHYSICAL   Margaret [name] is a very pleasant female who is 70 years of age.  She has a  history of aborted sudden cardiac death in the past.  She received an ICD.  Over time, there has been difficulty with migration of the ICD and the  pocket has actually been revised.  Most recently, today, she was seen in her  primary care office and it was noted that the pocket surrounding her ICD had  actually broken down and part of the metal of the ICD was extruding from the  pocket.  The patient has no pain.  She has had no fever.  She has come to  the emergency room  for further evaluation.   The ICD was placed in June 2005.  The patient has nonischemic  cardiomyopathy.  I do not have other data concerning this admission at this  time.  On October 29, 2003, the pocket revision was done by Dr. Graciela Husbands.  See his  operative note.  At that time, the area was cultured.  The device was placed  in the subpectoral/prepectoral combination pocket.  The patient was seen  last in the office by Dr. [name] on March 22, 2004.  In fact, her  original event was in 1996 when she had her ICD and she had a device change  in 2003 and then device migration was noted in July 2005.  There were white  cells in the area at that time but no bacteria were grown.  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  I know that she has seen Dr. Burnice Logan, but  I do not have that data at this time.   ALLERGIES:  No known drug allergies.   MEDICATIONS:  Lovastatin 40, Clarinex 5, Lanoxin 0.25, Ditropan XL 50 mg in  the  evening, Lisinopril 30, Coumadin 5 mg, and Coreg 6.25 b.i.d.   PAST MEDICAL HISTORY:  See the complete list below.   SOCIAL HISTORY AND FAMILY HISTORY:  These are outlined in the prior charts.  The patient does live with her husband who is here this evening with her in  the room. She does not smoke.  Family history regarding this particular  problem is not relative.  She has already had sudden cardiac death with an  ICD in place.   PHYSICAL EXAMINATION:  GENERAL:  On examination, the patient is stable at this time.  The patient  is oriented to person, time, and place.  Her affect is normal.  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  However, she communicates  very well.  VITAL SIGNS:  Blood pressure 155/80.  Her first temperature was 99.1  followed by 98.6.  Her pulse is 64 and respirations 18.  LUNGS:  Clear, respiratory effort is not labored.  HEENT:  No marked abnormalities.  There is no xanthelasma.  Extraocular  motion normal.  NECK:  There are no carotid bruits.  There is no jugular venous distention.  HEART:  Normal S1 and S2, no clicks or significant murmurs.  ABDOMEN:  Soft, she has no organomegaly, no bruits are heard.  EXTREMITIES:  The patient has trace peripheral edema at this time.  CHEST:  Examination of her chest reveals the areas of the varying pocket  attempts that had been made in the left pectoral area.  Her pacemaker pocket  is seen in the left axilla.  This pocket is partially opened at this time  and the metal from the ICD is seen.  There is a small amount of [place],  serosanguineous material oozing from this pocket.  This liquid seems similar  to what has been described in the past.   EKG reveals noisy baseline.  However, there is a suggestion of P-waves and I  believe that this is sinus rhythm with some PVCs seen.  Lab studies are  pending at this time.   PROBLEMS:  1.  History of sudden cardiac death in  the past with ventricular      fibrillation in 1996.  2.  Initial ICD placed at that time with a device change in 2003 followed by      device migration noted in 2005 and further follow up since then.  3.  Nonischemic cardiomyopathy.  4.  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  5.  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  6.  Hypercholesterolemia on Lovastatin.  7.  Coumadin therapy.  8.  Current continued migration of her ICD with tissue breakdown and      exposure of the actual ICD box metal.  The patient does not appear to be      infected at this time.  After phone discussion with Dr. [name], the plan      is to admit the patient for observation.  The site is to be cultured.      Blood cultures are to be obtained.  If the patient appears not to be      septic and she is stable, she may be allowed to go home with plans for      outpatient follow up and revision of her site at the end of next week.      JK/MEDQ  D:  09/17/2004  T:  09/17/2004  Job:  [id number]   cc:   Dario Guardian, M.D.  [address]  [place]  [place] [address]  Fax: [phone]   W. Ashley Royalty., M.D.  [address], [address]  [place]  [place] [address]  Fax: [phone]   Duke Salvia, M.D.   Rockey Situ. Flavia Shipper., M.D.  [address]  [place]  [place] [address]  Fax: [phone]

## 2010-09-10 NOTE — Op Note (Signed)
Margaret [name], Margaret [name] NO.:  [id number]   MEDICAL RECORD NO.:  [id number]          PATIENT TYPE:  INP   LOCATION:  2859                         FACILITY:  [facility]   PHYSICIAN:  Doylene Canning. [name], M.D.  DATE OF BIRTH:  Jul 12, 1940   DATE OF PROCEDURE:  12/30/2004  DATE OF DISCHARGE:  12/30/2004                                 OPERATIVE REPORT   PROCEDURE PERFORMED:  Attempted implantation of an implantable cardioverter-  defibrillator.   INDICATIONS:  Nonischemic cardiomyopathy with a history of VF arrest, status  post ICD implantation; with the left side of the device becoming infected  and being unable to be removed in total. She is now admitted to implant a  new right-sided system.   INTRODUCTION:  The patient is a 70 year old woman who underwent attempted  transvenous lead extraction of her 70 year old defibrillator lead several  months ago. She initially presented with her device hanging out of her  pocket, with the pocket exposed and chronically infected. Initial attempts  at removing the device were unsuccessful, in that the lead could not be  removed from its intravascular encasement. She was treated with several  months of antibiotics and her was allowed to heal by secondary intention,  for which it did nicely. Surveillance blood cultures have been sterile. She  is now referred for right-sided ICD implantation.   PROCEDURE:  After informed consent, the patient was taken to the diagnostic  EP lab in the fasting state. After the usual preparation and draping, a  total of 30 cc of lidocaine was infiltrated into the right infraclavicular  region. A 9 cm incision was carried out over this region. Electrocautery  utilized to dissect down to the fascial plane. The right subclavian vein  could not initially be cannulated. The cutdown of the cephalic vein was  subsequently carried out and a slick wire advanced into the cephalic vein,  and subsequently into the  subclavian vein. This was carried out with some  difficulty, as the vein was quite deep and quite small. Attempts to advance  the slick wire into the superior vena cava were unsuccessful. After the  slick wire traversed the cephalic vein, it would then dive either up into  the right internal jugular vein or back down the left subclavian vein.  Utilizing a slick wire as a marker for the subclavian vein, the subclavian  vein was punctured and a dilator advanced into the subclavian vein with a  hemostatic sheath. Venography was then carried out of this subclavian venous  system. 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 The  right subclavian vein and the right internal jugular vein came together and  formed a common body, after which there was occlusion.. Venography of this  vein demonstrated extensive collateralization in the neck and across the  skin, with those collateral vessels draining into the right atrium. At this  point, the decision was made to end the procedure, as clearly the patient  could not have an endocardial ICD placed. It will be discussed with Dr.  Donnie Aho for the indication for proceeding with epicardial  implantation.   COMPLICATIONS:  There were no immediate procedure complications.   RESULTS:  This demonstrates unsuccessful ICD implantation by way of the  right subclavian vein, secondary to occlusion of the superior vena cava.           ______________________________  Doylene Canning. [name], M.D.     GWT/MEDQ  D:  12/30/2004  T:  12/31/2004  Job:  [id number]   cc:   Georga Hacking, M.D.  [address]  [place]  [place] [address]  Fax: [phone]

## 2010-09-30 ENCOUNTER — Encounter: Payer: Self-pay | Admitting: Internal Medicine

## 2010-11-09 ENCOUNTER — Encounter: Payer: Self-pay | Admitting: Internal Medicine

## 2010-11-09 ENCOUNTER — Ambulatory Visit (INDEPENDENT_AMBULATORY_CARE_PROVIDER_SITE_OTHER): Payer: Medicare Other | Admitting: Internal Medicine

## 2010-11-09 DIAGNOSIS — I469 Cardiac arrest, cause unspecified: Secondary | ICD-10-CM

## 2010-11-09 DIAGNOSIS — I509 Heart failure, unspecified: Secondary | ICD-10-CM

## 2010-11-09 DIAGNOSIS — Z8674 Personal history of sudden cardiac arrest: Secondary | ICD-10-CM

## 2010-11-09 DIAGNOSIS — Z95810 Presence of automatic (implantable) cardiac defibrillator: Secondary | ICD-10-CM

## 2010-11-09 DIAGNOSIS — I4891 Unspecified atrial fibrillation: Secondary | ICD-10-CM | POA: Insufficient documentation

## 2010-11-09 LAB — ICD DEVICE OBSERVATION
ATRIAL PACING ICD: 42.4 pct
BAMS-0001: 170 {beats}/min
RV LEAD IMPEDENCE ICD: 703 Ohm
TZAT-0001ATACH: 3
TZAT-0002ATACH: NEGATIVE
TZAT-0002ATACH: NEGATIVE
TZAT-0002ATACH: NEGATIVE
TZAT-0012ATACH: 150 ms
TZAT-0018ATACH: NEGATIVE
TZAT-0019ATACH: 6 V
TZAT-0019ATACH: 6 V
TZAT-0019ATACH: 6 V
TZAT-0019SLOWVT: 8 V
TZAT-0020ATACH: 1.5 ms
TZAT-0020FASTVT: 1.5 ms
TZAT-0020SLOWVT: 1.5 ms
TZON-0003SLOWVT: 360 ms
TZON-0005SLOWVT: 12
TZST-0001ATACH: 5
TZST-0001ATACH: 6
TZST-0001FASTVT: 2
TZST-0001FASTVT: 3
TZST-0001SLOWVT: 3
TZST-0001SLOWVT: 5
TZST-0001SLOWVT: 6
TZST-0002ATACH: NEGATIVE
TZST-0002ATACH: NEGATIVE
TZST-0002ATACH: NEGATIVE
TZST-0002FASTVT: NEGATIVE
TZST-0002SLOWVT: NEGATIVE
TZST-0002SLOWVT: NEGATIVE

## 2010-11-09 NOTE — Assessment & Plan Note (Signed)
There has been just 14 seconds of intercurrent atrial fibrillation since device implantation

## 2010-11-09 NOTE — Progress Notes (Signed)
  HPI  Margaret [name] is a 69 y.o. female Seen in followup for aborted cardiac arrest status post ICD implantation was complicated by rongeur and required explantation. In March she was admitted to hospital with severe mitral regurgitation and was subjected to intraoperative repair at which time a CRT defibrillator system With epicardial patches was implanted by Dr. Cornelius Moras. Because of a narrow QRS, the LV leads Were not attached.As she had a history of paroxysmal atrial fibrillation she also underwent a Cox-Maze procedure.  She has done quite well. Breathing has not been the issue as much his balance.     Past Medical History  Diagnosis Date  . NICM (nonischemic cardiomyopathy)     EF 35%; cath 2/12 no CAD  . Systolic CHF, chronic   . Severe mitral regurgitation     s/p complex mitral valve repair with cox maze + LAA clipping, removal of RV lead, and implantation of CRT-D in abdomen  . Cardiac arrest - ventricular fibrillation     in 1990s  . Atrial fibrillation     amiodarone  . HTN (hypertension)   . HLD (hyperlipidemia)   . Achalasia     s/p dilation  . Recurrent UTI     No past surgical history on file.  Current Outpatient Prescriptions  Medication Sig Dispense Refill  . Cholecalciferol (VITAMIN D3) 2000 UNITS TABS Take by mouth 2 (two) times daily.        Marland Kitchen CLARINEX 5 MG tablet Take 1 tablet by mouth daily.      Marland Kitchen DETROL LA 4 MG 24 hr capsule Take 1 tablet by mouth daily.      . fluticasone (FLONASE) 50 MCG/ACT nasal spray 1 drop by Nasal route daily.      . folic acid (FOLVITE) 1 MG tablet Take 1 tablet by mouth daily.      . furosemide (LASIX) 40 MG tablet Take 1 tablet by mouth daily.      Marland Kitchen KLOR-CON M20 20 MEQ tablet Take 1 tablet by mouth daily.      Marland Kitchen lisinopril (PRINIVIL,ZESTRIL) 20 MG tablet Take 1 tablet by mouth Daily.      Marland Kitchen lovastatin (MEVACOR) 40 MG tablet Take 1 tablet by mouth daily.      . metoprolol tartrate (LOPRESSOR) 25 MG tablet Take 0.5 tablets by  mouth Twice daily.      . Polysaccharide Iron Complex (FERREX 150 PO) Take by mouth 2 (two) times daily.        Marland Kitchen warfarin (COUMADIN) 4 MG tablet Take 1 tablet by mouth as directed.        No Known Allergies  Review of Systems negative except from HPI and PMH  Physical Exam Well developed and well nourished in no acute distress HENT normal E scleral and icterus clear Neck Supple JVP flat; carotids brisk and full Clear to ausculation Regular rate and rhythm, no murmurs gallops or rub Soft with active bowel sounds No clubbing cyanosis and edema Alert and oriented, grossly normal motor and sensory function Skin Warm and Dry  ECG  Assessment and  Plan

## 2010-11-09 NOTE — Assessment & Plan Note (Signed)
The patient's device was interrogated.  The information was reviewed. No changes were made in the programming.    

## 2010-11-09 NOTE — Patient Instructions (Signed)
Your physician wants you to follow-up in: 9 months You will receive a reminder letter in the mail two months in advance. If you don't receive a letter, please call our office to schedule the follow-up appointment.  

## 2010-11-09 NOTE — Assessment & Plan Note (Signed)
No intercurrent ventricular arrhythmia 

## 2010-11-15 ENCOUNTER — Ambulatory Visit (INDEPENDENT_AMBULATORY_CARE_PROVIDER_SITE_OTHER): Payer: Medicare Other | Admitting: Thoracic Surgery (Cardiothoracic Vascular Surgery)

## 2010-11-15 ENCOUNTER — Encounter: Payer: Self-pay | Admitting: Thoracic Surgery (Cardiothoracic Vascular Surgery)

## 2010-11-15 VITALS — BP 130/84 | HR 86 | Resp 18

## 2010-11-15 DIAGNOSIS — Z9889 Other specified postprocedural states: Secondary | ICD-10-CM

## 2010-11-15 DIAGNOSIS — I498 Other specified cardiac arrhythmias: Secondary | ICD-10-CM

## 2010-11-15 DIAGNOSIS — Z8679 Personal history of other diseases of the circulatory system: Secondary | ICD-10-CM

## 2010-11-15 DIAGNOSIS — I4891 Unspecified atrial fibrillation: Secondary | ICD-10-CM

## 2010-11-15 DIAGNOSIS — I059 Rheumatic mitral valve disease, unspecified: Secondary | ICD-10-CM

## 2010-11-15 DIAGNOSIS — Z09 Encounter for follow-up examination after completed treatment for conditions other than malignant neoplasm: Secondary | ICD-10-CM

## 2010-11-15 DIAGNOSIS — R001 Bradycardia, unspecified: Secondary | ICD-10-CM

## 2010-11-15 NOTE — Assessment & Plan Note (Signed)
OFFICE VISIT  Margaret [name], Margaret [name] DOB:  30-Jun-1940                                        November 15, 2010 CHART #:  [id number]  HISTORY OF PRESENT ILLNESS:  The patient returns for routine followup and rhythm check status post mitral valve repair, maze procedure, removal of old endocardial right ventricular defibrillator lead and placement of new epicardial dual-chamber pacemaker and implantable defibrillator on July 15, 2010.  The patient was last seen here in the office on Aug 30, 2010.  Since then, she has continued to do quite well. She is followed carefully by Dr. Donnie Aho and Dr. Graciela Husbands and she returns to our office for a followup today.  She has had no problems at all.  She has no pain in her chest.  Her breathing is much better than it was prior to surgery and now she only gets short of breath when she really pushes herself physically.  Her primary limitation is that of her longstanding problems with physical mobility that are unrelated to her cardiac problems.  She has not had any tachy palpitations or dizzy spells.  Her defibrillator has never fired.  Overall, she has no complaints.  PHYSICAL EXAMINATION:  General:  Well-appearing female.  Vital Signs: Blood pressure 130/84, pulse 86 and regular.  Two channel telemetry rhythm strip demonstrates what appears to be sinus rhythm.  Oxygen saturation is 97% on room air.  Chest:  Well-healed median sternotomy scar.  Breath sounds are clear to auscultation and symmetrical bilaterally.  Cardiovascular:  Regular rate and rhythm.  No murmurs, rubs, or gallops are noted.  Abdomen:  Soft, nontender.  Extremities: Warm and well-perfused.  There is moderate bilateral lower extremity edema.  IMPRESSION:  The patient seems to be doing exceptionally well and is maintaining sinus rhythm.  PLAN:  We will plan to see [name] back in 3 months for routine followup and rhythm check.  Overall, she is getting along quite  well.  Salvatore Decent. Cornelius Moras, M.D. Electronically Signed  CHO/MEDQ  D:  11/15/2010  T:  11/15/2010  Job:  [id number]  cc:   Duke Salvia, MD, [facility]

## 2010-12-20 ENCOUNTER — Ambulatory Visit (HOSPITAL_COMMUNITY): Payer: Medicare Other

## 2010-12-20 ENCOUNTER — Encounter (HOSPITAL_COMMUNITY): Payer: Medicare Other | Attending: Cardiology

## 2010-12-20 DIAGNOSIS — Z9889 Other specified postprocedural states: Secondary | ICD-10-CM | POA: Insufficient documentation

## 2010-12-20 DIAGNOSIS — J45909 Unspecified asthma, uncomplicated: Secondary | ICD-10-CM | POA: Insufficient documentation

## 2010-12-20 DIAGNOSIS — Z950 Presence of cardiac pacemaker: Secondary | ICD-10-CM | POA: Insufficient documentation

## 2010-12-20 DIAGNOSIS — I82B29 Chronic embolism and thrombosis of unspecified subclavian vein: Secondary | ICD-10-CM | POA: Insufficient documentation

## 2010-12-20 DIAGNOSIS — I5022 Chronic systolic (congestive) heart failure: Secondary | ICD-10-CM | POA: Insufficient documentation

## 2010-12-20 DIAGNOSIS — I059 Rheumatic mitral valve disease, unspecified: Secondary | ICD-10-CM | POA: Insufficient documentation

## 2010-12-20 DIAGNOSIS — I509 Heart failure, unspecified: Secondary | ICD-10-CM | POA: Insufficient documentation

## 2010-12-20 DIAGNOSIS — E785 Hyperlipidemia, unspecified: Secondary | ICD-10-CM | POA: Insufficient documentation

## 2010-12-20 DIAGNOSIS — I428 Other cardiomyopathies: Secondary | ICD-10-CM | POA: Insufficient documentation

## 2010-12-20 DIAGNOSIS — Z5189 Encounter for other specified aftercare: Secondary | ICD-10-CM | POA: Insufficient documentation

## 2010-12-20 DIAGNOSIS — Z8674 Personal history of sudden cardiac arrest: Secondary | ICD-10-CM | POA: Insufficient documentation

## 2010-12-20 DIAGNOSIS — I4891 Unspecified atrial fibrillation: Secondary | ICD-10-CM | POA: Insufficient documentation

## 2010-12-20 DIAGNOSIS — N183 Chronic kidney disease, stage 3 unspecified: Secondary | ICD-10-CM | POA: Insufficient documentation

## 2010-12-20 DIAGNOSIS — I498 Other specified cardiac arrhythmias: Secondary | ICD-10-CM | POA: Insufficient documentation

## 2010-12-22 ENCOUNTER — Encounter (HOSPITAL_COMMUNITY): Payer: Medicare Other

## 2010-12-22 ENCOUNTER — Ambulatory Visit (HOSPITAL_COMMUNITY): Payer: Medicare Other

## 2010-12-24 ENCOUNTER — Ambulatory Visit (HOSPITAL_COMMUNITY): Payer: Medicare Other

## 2010-12-24 ENCOUNTER — Encounter (HOSPITAL_COMMUNITY): Payer: Medicare Other

## 2010-12-24 NOTE — Progress Notes (Signed)
Created prior to Epic go-live at [facility].  Closing encounter. ° °

## 2010-12-27 ENCOUNTER — Encounter (HOSPITAL_COMMUNITY): Payer: Medicare Other

## 2010-12-27 ENCOUNTER — Ambulatory Visit (HOSPITAL_COMMUNITY): Payer: Medicare Other

## 2010-12-29 ENCOUNTER — Encounter (HOSPITAL_COMMUNITY): Payer: Medicare Other | Attending: Cardiology

## 2010-12-29 ENCOUNTER — Ambulatory Visit (HOSPITAL_COMMUNITY): Payer: Medicare Other

## 2010-12-29 DIAGNOSIS — N183 Chronic kidney disease, stage 3 unspecified: Secondary | ICD-10-CM | POA: Insufficient documentation

## 2010-12-29 DIAGNOSIS — I5022 Chronic systolic (congestive) heart failure: Secondary | ICD-10-CM | POA: Insufficient documentation

## 2010-12-29 DIAGNOSIS — I509 Heart failure, unspecified: Secondary | ICD-10-CM | POA: Insufficient documentation

## 2010-12-29 DIAGNOSIS — I428 Other cardiomyopathies: Secondary | ICD-10-CM | POA: Insufficient documentation

## 2010-12-29 DIAGNOSIS — J45909 Unspecified asthma, uncomplicated: Secondary | ICD-10-CM | POA: Insufficient documentation

## 2010-12-29 DIAGNOSIS — E785 Hyperlipidemia, unspecified: Secondary | ICD-10-CM | POA: Insufficient documentation

## 2010-12-29 DIAGNOSIS — I059 Rheumatic mitral valve disease, unspecified: Secondary | ICD-10-CM | POA: Insufficient documentation

## 2010-12-29 DIAGNOSIS — Z8674 Personal history of sudden cardiac arrest: Secondary | ICD-10-CM | POA: Insufficient documentation

## 2010-12-29 DIAGNOSIS — I82B29 Chronic embolism and thrombosis of unspecified subclavian vein: Secondary | ICD-10-CM | POA: Insufficient documentation

## 2010-12-29 DIAGNOSIS — Z950 Presence of cardiac pacemaker: Secondary | ICD-10-CM | POA: Insufficient documentation

## 2010-12-29 DIAGNOSIS — Z9889 Other specified postprocedural states: Secondary | ICD-10-CM | POA: Insufficient documentation

## 2010-12-29 DIAGNOSIS — I4891 Unspecified atrial fibrillation: Secondary | ICD-10-CM | POA: Insufficient documentation

## 2010-12-29 DIAGNOSIS — Z5189 Encounter for other specified aftercare: Secondary | ICD-10-CM | POA: Insufficient documentation

## 2010-12-29 DIAGNOSIS — I498 Other specified cardiac arrhythmias: Secondary | ICD-10-CM | POA: Insufficient documentation

## 2010-12-31 ENCOUNTER — Ambulatory Visit (HOSPITAL_COMMUNITY): Payer: Medicare Other

## 2010-12-31 ENCOUNTER — Encounter (HOSPITAL_COMMUNITY): Payer: Medicare Other

## 2011-01-03 ENCOUNTER — Ambulatory Visit (HOSPITAL_COMMUNITY): Payer: Medicare Other

## 2011-01-03 ENCOUNTER — Encounter (HOSPITAL_COMMUNITY): Payer: Medicare Other

## 2011-01-05 ENCOUNTER — Encounter (HOSPITAL_COMMUNITY): Payer: Medicare Other

## 2011-01-05 ENCOUNTER — Ambulatory Visit (HOSPITAL_COMMUNITY): Payer: Medicare Other

## 2011-01-07 ENCOUNTER — Encounter (HOSPITAL_COMMUNITY): Payer: Medicare Other

## 2011-01-07 ENCOUNTER — Ambulatory Visit (HOSPITAL_COMMUNITY): Payer: Medicare Other

## 2011-01-10 ENCOUNTER — Encounter (HOSPITAL_COMMUNITY): Payer: Medicare Other

## 2011-01-10 ENCOUNTER — Ambulatory Visit (HOSPITAL_COMMUNITY): Payer: Medicare Other

## 2011-01-12 ENCOUNTER — Encounter (HOSPITAL_COMMUNITY): Payer: Medicare Other

## 2011-01-12 ENCOUNTER — Ambulatory Visit (HOSPITAL_COMMUNITY): Payer: Medicare Other

## 2011-01-14 ENCOUNTER — Ambulatory Visit (HOSPITAL_COMMUNITY): Payer: Medicare Other

## 2011-01-14 ENCOUNTER — Encounter (HOSPITAL_COMMUNITY): Payer: Medicare Other

## 2011-01-17 ENCOUNTER — Encounter (HOSPITAL_COMMUNITY): Payer: Medicare Other

## 2011-01-17 ENCOUNTER — Ambulatory Visit (HOSPITAL_COMMUNITY): Payer: Medicare Other

## 2011-01-19 ENCOUNTER — Ambulatory Visit (HOSPITAL_COMMUNITY): Payer: Medicare Other

## 2011-01-19 ENCOUNTER — Encounter (HOSPITAL_COMMUNITY): Payer: Medicare Other

## 2011-01-21 ENCOUNTER — Encounter (HOSPITAL_COMMUNITY): Payer: Medicare Other

## 2011-01-21 ENCOUNTER — Ambulatory Visit (HOSPITAL_COMMUNITY): Payer: Medicare Other

## 2011-01-24 ENCOUNTER — Ambulatory Visit (HOSPITAL_COMMUNITY): Payer: Medicare Other

## 2011-01-24 ENCOUNTER — Encounter (HOSPITAL_COMMUNITY): Payer: Medicare Other | Attending: Cardiology

## 2011-01-24 DIAGNOSIS — I82B29 Chronic embolism and thrombosis of unspecified subclavian vein: Secondary | ICD-10-CM | POA: Insufficient documentation

## 2011-01-24 DIAGNOSIS — I5022 Chronic systolic (congestive) heart failure: Secondary | ICD-10-CM | POA: Insufficient documentation

## 2011-01-24 DIAGNOSIS — Z5189 Encounter for other specified aftercare: Secondary | ICD-10-CM | POA: Insufficient documentation

## 2011-01-24 DIAGNOSIS — I509 Heart failure, unspecified: Secondary | ICD-10-CM | POA: Insufficient documentation

## 2011-01-24 DIAGNOSIS — I4891 Unspecified atrial fibrillation: Secondary | ICD-10-CM | POA: Insufficient documentation

## 2011-01-24 DIAGNOSIS — I498 Other specified cardiac arrhythmias: Secondary | ICD-10-CM | POA: Insufficient documentation

## 2011-01-24 DIAGNOSIS — Z950 Presence of cardiac pacemaker: Secondary | ICD-10-CM | POA: Insufficient documentation

## 2011-01-24 DIAGNOSIS — I059 Rheumatic mitral valve disease, unspecified: Secondary | ICD-10-CM | POA: Insufficient documentation

## 2011-01-24 DIAGNOSIS — J45909 Unspecified asthma, uncomplicated: Secondary | ICD-10-CM | POA: Insufficient documentation

## 2011-01-24 DIAGNOSIS — Z8674 Personal history of sudden cardiac arrest: Secondary | ICD-10-CM | POA: Insufficient documentation

## 2011-01-24 DIAGNOSIS — I428 Other cardiomyopathies: Secondary | ICD-10-CM | POA: Insufficient documentation

## 2011-01-24 DIAGNOSIS — N183 Chronic kidney disease, stage 3 unspecified: Secondary | ICD-10-CM | POA: Insufficient documentation

## 2011-01-24 DIAGNOSIS — E785 Hyperlipidemia, unspecified: Secondary | ICD-10-CM | POA: Insufficient documentation

## 2011-01-24 DIAGNOSIS — Z9889 Other specified postprocedural states: Secondary | ICD-10-CM | POA: Insufficient documentation

## 2011-01-26 ENCOUNTER — Ambulatory Visit (HOSPITAL_COMMUNITY): Payer: Medicare Other

## 2011-01-26 ENCOUNTER — Encounter (HOSPITAL_COMMUNITY): Payer: Medicare Other

## 2011-01-28 ENCOUNTER — Encounter (HOSPITAL_COMMUNITY): Payer: Medicare Other

## 2011-01-28 ENCOUNTER — Ambulatory Visit (HOSPITAL_COMMUNITY): Payer: Medicare Other

## 2011-01-31 ENCOUNTER — Ambulatory Visit (HOSPITAL_COMMUNITY): Payer: Medicare Other

## 2011-01-31 ENCOUNTER — Encounter (HOSPITAL_COMMUNITY): Payer: Medicare Other

## 2011-02-02 ENCOUNTER — Ambulatory Visit (HOSPITAL_COMMUNITY): Payer: Medicare Other

## 2011-02-02 ENCOUNTER — Encounter (HOSPITAL_COMMUNITY): Payer: Medicare Other

## 2011-02-04 ENCOUNTER — Encounter (HOSPITAL_COMMUNITY): Payer: Medicare Other

## 2011-02-04 ENCOUNTER — Ambulatory Visit (HOSPITAL_COMMUNITY): Payer: Medicare Other

## 2011-02-07 ENCOUNTER — Encounter (HOSPITAL_COMMUNITY): Payer: Medicare Other

## 2011-02-07 ENCOUNTER — Ambulatory Visit (HOSPITAL_COMMUNITY): Payer: Medicare Other

## 2011-02-09 ENCOUNTER — Ambulatory Visit (HOSPITAL_COMMUNITY): Payer: Medicare Other

## 2011-02-09 ENCOUNTER — Encounter (HOSPITAL_COMMUNITY): Payer: Medicare Other

## 2011-02-09 ENCOUNTER — Encounter: Payer: Self-pay | Admitting: Thoracic Surgery (Cardiothoracic Vascular Surgery)

## 2011-02-09 DIAGNOSIS — G931 Anoxic brain damage, not elsewhere classified: Secondary | ICD-10-CM | POA: Insufficient documentation

## 2011-02-09 DIAGNOSIS — M1990 Unspecified osteoarthritis, unspecified site: Secondary | ICD-10-CM

## 2011-02-09 DIAGNOSIS — E669 Obesity, unspecified: Secondary | ICD-10-CM

## 2011-02-11 ENCOUNTER — Ambulatory Visit (HOSPITAL_COMMUNITY): Payer: Medicare Other

## 2011-02-11 ENCOUNTER — Encounter (HOSPITAL_COMMUNITY): Payer: Medicare Other

## 2011-02-14 ENCOUNTER — Ambulatory Visit (INDEPENDENT_AMBULATORY_CARE_PROVIDER_SITE_OTHER): Payer: Medicare Other | Admitting: Thoracic Surgery (Cardiothoracic Vascular Surgery)

## 2011-02-14 ENCOUNTER — Encounter: Payer: Self-pay | Admitting: Thoracic Surgery (Cardiothoracic Vascular Surgery)

## 2011-02-14 ENCOUNTER — Encounter (HOSPITAL_COMMUNITY): Payer: Medicare Other

## 2011-02-14 ENCOUNTER — Ambulatory Visit (HOSPITAL_COMMUNITY): Payer: Medicare Other

## 2011-02-14 VITALS — BP 133/79 | HR 84 | Resp 20 | Ht 62.0 in | Wt 190.0 lb

## 2011-02-14 DIAGNOSIS — Z8679 Personal history of other diseases of the circulatory system: Secondary | ICD-10-CM

## 2011-02-14 DIAGNOSIS — I059 Rheumatic mitral valve disease, unspecified: Secondary | ICD-10-CM

## 2011-02-14 DIAGNOSIS — R001 Bradycardia, unspecified: Secondary | ICD-10-CM

## 2011-02-14 DIAGNOSIS — Z9889 Other specified postprocedural states: Secondary | ICD-10-CM

## 2011-02-14 DIAGNOSIS — I498 Other specified cardiac arrhythmias: Secondary | ICD-10-CM

## 2011-02-14 DIAGNOSIS — I340 Nonrheumatic mitral (valve) insufficiency: Secondary | ICD-10-CM

## 2011-02-14 DIAGNOSIS — I4891 Unspecified atrial fibrillation: Secondary | ICD-10-CM

## 2011-02-14 NOTE — Progress Notes (Signed)
PCP is Johny Sax, MD, MD Referring Provider is [name]., *  Chief Complaint  Patient presents with  . Routine Post Op    3 mo f/u, S/P MVR and MAZE     HPI: Patient returns for routine followup with rhythm check now more than 6 months following mitral valve repair, Maze procedure, removal of old endocardial pacemaker defibrillator lead, and placement of new implantable cardiac defibrillator with epicardial pacemaker and defibrillator leads and patches. She was last seen here in our office on 11/15/2010. Since then she has continued to do very well. She is actively taking part in the cardiac rehabilitation program. She is not having shortness of breath. She is not having chest pain. She's not having any tachypalpitations or dizzy spells. Her defibrillator has not fired. Clinically she is doing quite well.  Past Medical History  Diagnosis Date  . NICM (nonischemic cardiomyopathy)     EF 35%; cath 2/12 no CAD  . Systolic CHF, chronic   . Severe mitral regurgitation     s/p complex mitral valve repair with cox maze + LAA clipping, removal of RV lead, and implantation of CRT-D in abdomen  . Cardiac arrest - ventricular fibrillation     in 1990s  . Atrial fibrillation     amiodarone  . HTN (hypertension)   . HLD (hyperlipidemia)   . Achalasia     s/p dilation  . Recurrent UTI   . Obesity   . Arthritis   . Anoxic brain injury 1996    s/p cardiac arrest     Past Surgical History  Procedure Date  . Defibrillator generator explantation and reimplantation; and 08/02/2001  . Device migration with anticipated pocket revision 10/29/2003  . 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 Attempted implantation of an implantable cardioverter- 12/30/2004  . Cardioversion 04/14/2010  . Median sternotomy 07/16/2010  . Mitral valve repair 07/16/2010  . Cox maze procedure (complete biatrial lesion set with clipping of 07/16/2010  . Removal of old  endocardial right ventricular defibrillator lead. 07/16/2010  . Placement of dual chamber pacemaker and implantable cardiac 07/16/2010  . Placement of swan ganz pulmonary artery catheter via left femoral access. 07/16/2010    No family history on file.  Social History History  Substance Use Topics  . Smoking status: Never Smoker   . Smokeless tobacco: Not on file  . Alcohol Use: No    Current Outpatient Prescriptions  Medication Sig Dispense Refill  . Cholecalciferol (VITAMIN D3) 2000 UNITS TABS Take by mouth 2 (two) times daily.        Marland Kitchen CLARINEX 5 MG tablet Take 1 tablet by mouth daily.      Marland Kitchen DETROL LA 4 MG 24 hr capsule Take 1 tablet by mouth daily.      . fluticasone (FLONASE) 50 MCG/ACT nasal spray 1 drop by Nasal route daily.      . folic acid (FOLVITE) 1 MG tablet Take 1 tablet by mouth daily.      . furosemide (LASIX) 40 MG tablet Take 1 tablet by mouth daily.      Marland Kitchen KLOR-CON M20 20 MEQ tablet Take 1 tablet by mouth daily.      Marland Kitchen lisinopril (PRINIVIL,ZESTRIL) 20 MG tablet Take 1 tablet by mouth Daily.      Marland Kitchen lovastatin (MEVACOR) 40 MG tablet Take 1 tablet by mouth daily.      . metoprolol tartrate (LOPRESSOR) 25 MG tablet Take 0.5 tablets by  mouth Twice daily.      . Polysaccharide Iron Complex (FERREX 150 PO) Take by mouth 2 (two) times daily.        Marland Kitchen warfarin (COUMADIN) 4 MG tablet Take 1 tablet by mouth as directed.        No Known Allergies  Review of Systems: Otherwise unremarkable  BP 133/79  Pulse 84  Resp 20  Ht 5\' 2"  (1.575 m)  Wt 190 lb (86.183 kg)  BMI 34.75 kg/m2  SpO2 95% Physical Exam: On physical exam the patient looks quite good. 2 channel telemetry rhythm strip demonstrates normal sinus rhythm. Auscultation of the chest reveals clear breath sounds. Cardiovascular exam is notable for regular rate and rhythm. No murmurs rubs or gallops are noted. The abdomen is soft nontender. Extremities are warm and well-perfused.   Impression: The patient is  doing quite well more than 6 months following vital valve repair, Maze procedure, and placement of new implantable defibrillator with epicardial pacer and defibrillator patches. She is maintaining sinus rhythm  Plan: We will have the patient return in 6 months for routine followup and rhythm check just to see how she is getting along.

## 2011-02-16 ENCOUNTER — Encounter (HOSPITAL_COMMUNITY): Payer: Medicare Other

## 2011-02-16 ENCOUNTER — Ambulatory Visit (HOSPITAL_COMMUNITY): Payer: Medicare Other

## 2011-02-18 ENCOUNTER — Ambulatory Visit (HOSPITAL_COMMUNITY): Payer: Medicare Other

## 2011-02-18 ENCOUNTER — Encounter (HOSPITAL_COMMUNITY): Payer: Medicare Other

## 2011-02-21 ENCOUNTER — Ambulatory Visit (HOSPITAL_COMMUNITY): Payer: Medicare Other

## 2011-02-21 ENCOUNTER — Encounter (HOSPITAL_COMMUNITY): Payer: Medicare Other

## 2011-02-23 ENCOUNTER — Ambulatory Visit (HOSPITAL_COMMUNITY): Payer: Medicare Other

## 2011-02-23 ENCOUNTER — Encounter (HOSPITAL_COMMUNITY): Payer: Medicare Other

## 2011-02-25 ENCOUNTER — Encounter (HOSPITAL_COMMUNITY): Payer: Medicare Other

## 2011-02-25 ENCOUNTER — Ambulatory Visit (HOSPITAL_COMMUNITY): Payer: Medicare Other

## 2011-02-25 DIAGNOSIS — N183 Chronic kidney disease, stage 3 unspecified: Secondary | ICD-10-CM | POA: Insufficient documentation

## 2011-02-25 DIAGNOSIS — I4891 Unspecified atrial fibrillation: Secondary | ICD-10-CM | POA: Insufficient documentation

## 2011-02-25 DIAGNOSIS — Z8674 Personal history of sudden cardiac arrest: Secondary | ICD-10-CM | POA: Insufficient documentation

## 2011-02-25 DIAGNOSIS — I509 Heart failure, unspecified: Secondary | ICD-10-CM | POA: Insufficient documentation

## 2011-02-25 DIAGNOSIS — I498 Other specified cardiac arrhythmias: Secondary | ICD-10-CM | POA: Insufficient documentation

## 2011-02-25 DIAGNOSIS — I428 Other cardiomyopathies: Secondary | ICD-10-CM | POA: Insufficient documentation

## 2011-02-25 DIAGNOSIS — I059 Rheumatic mitral valve disease, unspecified: Secondary | ICD-10-CM | POA: Insufficient documentation

## 2011-02-25 DIAGNOSIS — Z950 Presence of cardiac pacemaker: Secondary | ICD-10-CM | POA: Insufficient documentation

## 2011-02-25 DIAGNOSIS — I5022 Chronic systolic (congestive) heart failure: Secondary | ICD-10-CM | POA: Insufficient documentation

## 2011-02-25 DIAGNOSIS — I82B29 Chronic embolism and thrombosis of unspecified subclavian vein: Secondary | ICD-10-CM | POA: Insufficient documentation

## 2011-02-25 DIAGNOSIS — J45909 Unspecified asthma, uncomplicated: Secondary | ICD-10-CM | POA: Insufficient documentation

## 2011-02-25 DIAGNOSIS — Z9889 Other specified postprocedural states: Secondary | ICD-10-CM | POA: Insufficient documentation

## 2011-02-25 DIAGNOSIS — E785 Hyperlipidemia, unspecified: Secondary | ICD-10-CM | POA: Insufficient documentation

## 2011-02-25 DIAGNOSIS — Z5189 Encounter for other specified aftercare: Secondary | ICD-10-CM | POA: Insufficient documentation

## 2011-02-28 ENCOUNTER — Ambulatory Visit (HOSPITAL_COMMUNITY): Payer: Medicare Other

## 2011-02-28 ENCOUNTER — Encounter (HOSPITAL_COMMUNITY): Payer: Medicare Other

## 2011-03-02 ENCOUNTER — Encounter (HOSPITAL_COMMUNITY): Payer: Medicare Other

## 2011-03-02 ENCOUNTER — Ambulatory Visit (HOSPITAL_COMMUNITY): Payer: Medicare Other

## 2011-03-04 ENCOUNTER — Encounter (HOSPITAL_COMMUNITY): Payer: Medicare Other

## 2011-03-04 ENCOUNTER — Ambulatory Visit (HOSPITAL_COMMUNITY): Payer: Medicare Other

## 2011-03-07 ENCOUNTER — Ambulatory Visit (HOSPITAL_COMMUNITY): Payer: Medicare Other

## 2011-03-07 ENCOUNTER — Encounter (HOSPITAL_COMMUNITY): Payer: Medicare Other

## 2011-03-09 ENCOUNTER — Encounter (HOSPITAL_COMMUNITY): Payer: Medicare Other

## 2011-03-09 ENCOUNTER — Ambulatory Visit (HOSPITAL_COMMUNITY): Payer: Medicare Other

## 2011-03-11 ENCOUNTER — Ambulatory Visit (HOSPITAL_COMMUNITY): Payer: Medicare Other

## 2011-03-11 ENCOUNTER — Encounter (HOSPITAL_COMMUNITY)
Admission: RE | Admit: 2011-03-11 | Discharge: 2011-03-11 | Disposition: A | Discharge status: Home / Self Care | Payer: Medicare Other | Source: Ambulatory Visit | Attending: Cardiology | Admitting: Cardiology

## 2011-03-14 ENCOUNTER — Ambulatory Visit (HOSPITAL_COMMUNITY): Payer: Medicare Other

## 2011-03-14 ENCOUNTER — Encounter (HOSPITAL_COMMUNITY)
Admission: RE | Admit: 2011-03-14 | Discharge: 2011-03-14 | Disposition: A | Discharge status: Home / Self Care | Payer: Medicare Other | Source: Ambulatory Visit | Attending: Cardiology | Admitting: Cardiology

## 2011-03-16 ENCOUNTER — Ambulatory Visit (HOSPITAL_COMMUNITY): Payer: Medicare Other

## 2011-03-16 ENCOUNTER — Encounter (HOSPITAL_COMMUNITY): Admission: RE | Admit: 2011-03-16 | Payer: Medicare Other | Source: Ambulatory Visit

## 2011-03-16 NOTE — Progress Notes (Signed)
Cardiac Rehab 11:15 am exercise class.  Pt in today for her last day of exercise.  Pt with noted elevated heart rate at rest.   Upon inquiry pt drank one can of coke zero prior to exercise however pt drinks one every day.  Pt husband called on cell phone and talked with him about her medications this morning.  He reports that he gave all of her meds as directed.  Olegario Messier Dr. Donnie Aho nurse called and message left.  Fax sent to Md office for review.  Pt not permitted to exercise due to elevation in heart rate.  Pt asymptomatic.

## 2011-03-18 ENCOUNTER — Encounter (HOSPITAL_COMMUNITY): Payer: Medicare Other

## 2011-03-18 ENCOUNTER — Ambulatory Visit (HOSPITAL_COMMUNITY): Payer: Medicare Other

## 2011-03-21 ENCOUNTER — Ambulatory Visit (HOSPITAL_COMMUNITY): Payer: Medicare Other

## 2011-03-21 ENCOUNTER — Encounter (HOSPITAL_COMMUNITY): Payer: Medicare Other

## 2011-03-23 ENCOUNTER — Encounter (HOSPITAL_COMMUNITY): Payer: Medicare Other

## 2011-03-23 ENCOUNTER — Ambulatory Visit (HOSPITAL_COMMUNITY): Payer: Medicare Other

## 2011-03-25 ENCOUNTER — Ambulatory Visit (HOSPITAL_COMMUNITY): Payer: Medicare Other

## 2011-03-25 ENCOUNTER — Encounter (HOSPITAL_COMMUNITY): Payer: Medicare Other

## 2011-04-01 ENCOUNTER — Other Ambulatory Visit: Payer: Self-pay | Admitting: Cardiology

## 2011-04-01 ENCOUNTER — Ambulatory Visit
Admission: RE | Admit: 2011-04-01 | Discharge: 2011-04-01 | Disposition: A | Discharge status: Home / Self Care | Payer: Medicare Other | Source: Ambulatory Visit | Attending: Cardiology | Admitting: Cardiology

## 2011-04-01 DIAGNOSIS — R0609 Other forms of dyspnea: Secondary | ICD-10-CM

## 2011-04-01 DIAGNOSIS — R0989 Other specified symptoms and signs involving the circulatory and respiratory systems: Secondary | ICD-10-CM

## 2011-05-13 ENCOUNTER — Other Ambulatory Visit: Payer: Self-pay | Admitting: Cardiology

## 2011-05-25 ENCOUNTER — Telehealth: Payer: Self-pay | Admitting: Internal Medicine

## 2011-05-25 NOTE — Telephone Encounter (Signed)
Appt made with Dr. Graciela Husbands for 05-26-2011.

## 2011-05-25 NOTE — Telephone Encounter (Signed)
New Msg: Pt calling stating that Dr. Donnie Aho wanted Dr. [name] to see pt ASAP. Soonest available appt for Dr. Graciela Husbands in 06/28/11 and that is too far away. In the past month pt defib has fired twice however pt was unaware; therefore Dr. Donnie Aho would like pt to be seen.   Please return pt husband calling to discuss further.

## 2011-05-26 ENCOUNTER — Ambulatory Visit: Payer: Medicare Other | Admitting: Internal Medicine

## 2011-05-26 ENCOUNTER — Encounter: Payer: Self-pay | Admitting: Internal Medicine

## 2011-05-26 ENCOUNTER — Ambulatory Visit (INDEPENDENT_AMBULATORY_CARE_PROVIDER_SITE_OTHER): Payer: Medicare Other | Admitting: Internal Medicine

## 2011-05-26 DIAGNOSIS — Z95810 Presence of automatic (implantable) cardiac defibrillator: Secondary | ICD-10-CM

## 2011-05-26 DIAGNOSIS — I509 Heart failure, unspecified: Secondary | ICD-10-CM

## 2011-05-26 DIAGNOSIS — I4891 Unspecified atrial fibrillation: Secondary | ICD-10-CM

## 2011-05-26 LAB — ICD DEVICE OBSERVATION
AL AMPLITUDE: 2 mv
AL IMPEDENCE ICD: 551 Ohm
ATRIAL PACING ICD: 38.29 pct
CHARGE TIME: 8.318 s
RV LEAD AMPLITUDE: 12.25 mv
RV LEAD IMPEDENCE ICD: 608 Ohm
TOT-0001: 1
TOT-0006: 20120323000000
TZAT-0001FASTVT: 1
TZAT-0001SLOWVT: 1
TZAT-0002ATACH: NEGATIVE
TZAT-0002ATACH: NEGATIVE
TZAT-0002SLOWVT: NEGATIVE
TZAT-0012ATACH: 150 ms
TZAT-0012FASTVT: 200 ms
TZAT-0018ATACH: NEGATIVE
TZAT-0019ATACH: 6 V
TZAT-0019ATACH: 6 V
TZAT-0019ATACH: 6 V
TZAT-0020ATACH: 1.5 ms
TZAT-0020ATACH: 1.5 ms
TZAT-0020SLOWVT: 1.5 ms
TZST-0001FASTVT: 4
TZST-0001FASTVT: 5
TZST-0001SLOWVT: 3
TZST-0001SLOWVT: 5
TZST-0002FASTVT: NEGATIVE
TZST-0002FASTVT: NEGATIVE
TZST-0002FASTVT: NEGATIVE
TZST-0002SLOWVT: NEGATIVE
VF: 1

## 2011-05-26 NOTE — Assessment & Plan Note (Signed)
The patient has had her defibrillator utilized for cardioversion. She is back in sinus rhythm. Device function was reviewed with the family extensively

## 2011-05-26 NOTE — Assessment & Plan Note (Addendum)
She is acute congestive heart failure with ejection fraction was relieved 25%. This may be partly tachycardia induced. She normalized her volume index when she reverted to sinus rhythm following her inappropriate shock. Hopefully the single happened here this will leave her diuretics as they currently are. I reviewed this with Dr. Donnie Aho and that would be his plan

## 2011-05-26 NOTE — Progress Notes (Signed)
HPI  Margaret [name] is a 71 y.o. female  Seen in followup for aborted cardiac arrest status post ICD implantation.     In March 2012 she was admitted to hospital with severe mitral regurgitation and was subjected to intraoperative repair at which time a CRT defibrillator system With epicardial patches was implanted by Dr. Cornelius Moras. Because of a narrow QRS, the LV leads Were not attached.As she had a history of paroxysmal atrial fibrillation she also underwent a Cox-Maze procedure.    She has had recent icd discharges and was found by Dr. Donnie Aho yesterday to be in atrial flutter. In fact interrogation of the device demonstrates that she had one shock which was in December. It was for one-to-one flutter. She then had a normalization of her fluid index. She recurred in flutter about 3 or 4 weeks later and her fluid index has gone abnormal again. these abnormalities are coincidental with heart failure symptoms including peripheral edema and marked shortness of breath.  Past Medical History  Diagnosis Date  . NICM (nonischemic cardiomyopathy)     EF 35%; cath 2/12 no CAD  . Systolic CHF, chronic   . Severe mitral regurgitation     s/p complex mitral valve repair with cox maze + LAA clipping, removal of RV lead, and implantation of CRT-D in abdomen  . Cardiac arrest - ventricular fibrillation     in 1990s  . Atrial fibrillation     amiodarone  . HTN (hypertension)   . HLD (hyperlipidemia)   . Achalasia     s/p dilation  . Recurrent UTI   . Obesity   . Arthritis   . Anoxic brain injury 1996    s/p cardiac arrest     Past Surgical History  Procedure Date  . Defibrillator generator explantation and reimplantation; and 08/02/2001  . Device migration with anticipated pocket revision 10/29/2003  . 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 Attempted implantation of an implantable cardioverter- 12/30/2004  . Cardioversion 04/14/2010  . Median sternotomy  07/16/2010  . Mitral valve repair 07/16/2010  . Cox maze procedure (complete biatrial lesion set with clipping of 07/16/2010  . Removal of old endocardial right ventricular defibrillator lead. 07/16/2010  . Placement of dual chamber pacemaker and implantable cardiac 07/16/2010  . Placement of swan ganz pulmonary artery catheter via left femoral access. 07/16/2010    Current Outpatient Prescriptions  Medication Sig Dispense Refill  . Cholecalciferol (VITAMIN D3) 2000 UNITS TABS Take by mouth 2 (two) times daily.        Marland Kitchen CLARINEX 5 MG tablet Take 1 tablet by mouth daily.      Marland Kitchen DETROL LA 4 MG 24 hr capsule Take 1 tablet by mouth daily.      . fluticasone (FLONASE) 50 MCG/ACT nasal spray 1 drop by Nasal route daily.      . folic acid (FOLVITE) 1 MG tablet Take 1 tablet by mouth daily.      . furosemide (LASIX) 40 MG tablet Take 1 tablet by mouth daily.      Marland Kitchen KLOR-CON M20 20 MEQ tablet Take 1 tablet by mouth daily.      Marland Kitchen lisinopril (PRINIVIL,ZESTRIL) 20 MG tablet Take 1 tablet by mouth Daily.      Marland Kitchen lovastatin (MEVACOR) 40 MG tablet Take 1 tablet by mouth daily.      . metoprolol tartrate (LOPRESSOR) 25 MG tablet Take 0.5 tablets by mouth Twice daily.      Marland Kitchen  Polysaccharide Iron Complex (FERREX 150 PO) Take by mouth 2 (two) times daily.        Marland Kitchen warfarin (COUMADIN) 4 MG tablet Take 1 tablet by mouth as directed.        No Known Allergies  Review of Systems negative except from HPI and PMH  Physical Exam There were no vitals taken for this visit. BP 106/74  Pulse 110  Ht 5' 4.5" (1.638 m)  Wt 196 lb (88.905 kg)  BMI 33.12 kg/m2  Well developed and well nourished in no acute distress HENT normal E scleral and icterus clear Neck Supple JVP>10 carotids brisk and full Clear to ausculation Rapid but Regular rate and rhythm, no murmurs gallops or rub Soft with active bowel sounds No clubbing cyanosis pitting Edema Alert and oriented, grossly normal motor and sensory  function Skin Warm and Dry  The patient was admitted for pace termination of her atrial flutter via her ICD. This was successful  Her INR had been therapeutic for more than a month   Assessment and  Plan

## 2011-05-26 NOTE — Patient Instructions (Signed)
Your physician wants you to follow-up in: 1 year with Dr. Klein. You will receive a reminder letter in the mail two months in advance. If you don't receive a letter, please call our office to schedule the follow-up appointment.  Your physician recommends that you continue on your current medications as directed. Please refer to the Current Medication list given to you today.  

## 2011-05-26 NOTE — Assessment & Plan Note (Signed)
The patient has post maze atrial flutter. It was cardioverted because of eye inappropriate shock for one-to-one conduction in December with recurrence about 3 weeks later. We were able to successfully pace termination   Last INR was 2.5

## 2011-08-15 ENCOUNTER — Other Ambulatory Visit: Payer: Self-pay | Admitting: Physical Medicine & Rehabilitation

## 2011-08-15 ENCOUNTER — Ambulatory Visit: Payer: Medicare Other | Admitting: Thoracic Surgery (Cardiothoracic Vascular Surgery)

## 2011-08-15 ENCOUNTER — Other Ambulatory Visit: Payer: Self-pay | Admitting: Cardiology

## 2011-08-30 ENCOUNTER — Ambulatory Visit (INDEPENDENT_AMBULATORY_CARE_PROVIDER_SITE_OTHER): Payer: Medicare Other | Admitting: Family Medicine

## 2011-08-30 VITALS — BP 110/74 | HR 69 | Temp 97.6°F | Resp 16

## 2011-08-30 DIAGNOSIS — T148XXA Other injury of unspecified body region, initial encounter: Secondary | ICD-10-CM

## 2011-08-30 DIAGNOSIS — IMO0002 Reserved for concepts with insufficient information to code with codable children: Secondary | ICD-10-CM

## 2011-08-30 MED ORDER — DOXYCYCLINE HYCLATE 100 MG PO TABS: 100.0000 mg | ORAL_TABLET | Freq: Two times a day (BID) | ORAL | Status: AC

## 2011-08-30 NOTE — Progress Notes (Signed)
   Patient ID: Margaret [name] MRN: [id number], DOB: 1941-04-19, 71 y.o. Date of Encounter: 08/30/2011, 12:19 PM   PROCEDURE NOTE: Verbal consent obtained. Sterile technique employed. Numbing: Anesthesia obtained with 1:1 ratio 2% plain lidocaine with 0.5% Marcaine  Betadine prep per usual protocol.  Proximal nail already lifted without difficulty, as the distal portion has already been lifted. Nail removed in total. Proximal aspect of nail bed explored revealing no nail remnants. Ingrown tissue debrided and irrigated. Xeroform dressing applied. Wound care instructions including precautions covered with patient. Handout given.   Signed,  Eula Listen, PA-C 08/30/2011 58:65 PM   71 year old woman who woke up with bloody left great toenail and small amount pain. She notes swelling and redness around the cuticle as well.  Minimal discomfort.  Patient on anticoagulants  Objective loose great toenail with mild swelling of the cuticle and redness extending a third the way to the DIP joint.  Patient was treated by the excellent physician assistant, Margaret [name] done  Assessment: Paronychia with nail evulsion on the left great toe  Plan: Doxycycline.

## 2011-09-08 IMAGING — US US RENAL
1 series · 14 of 25 positions shown · non-contrast
Comparison: CT abdomen and pelvis 04/15/2010.

CLINICAL DATA: 69-year-old female with bladder infection.

RENAL/URINARY TRACT ULTRASOUND COMPLETE

[Series 1: us renal · 0.28mm/px · 14 of 34 slices shown]
[im 1/34]
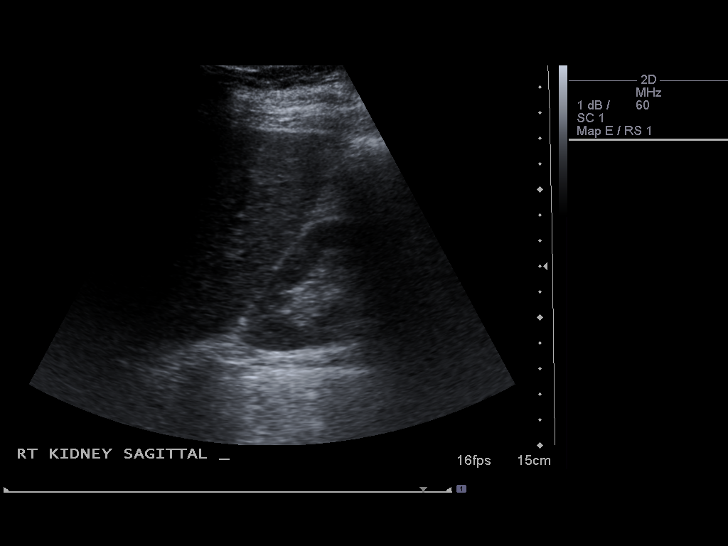
[im 3/34]
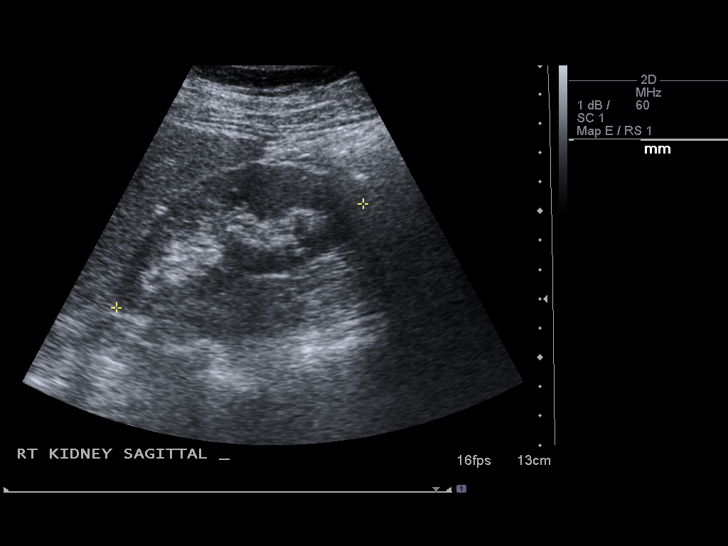
[im 6/34]
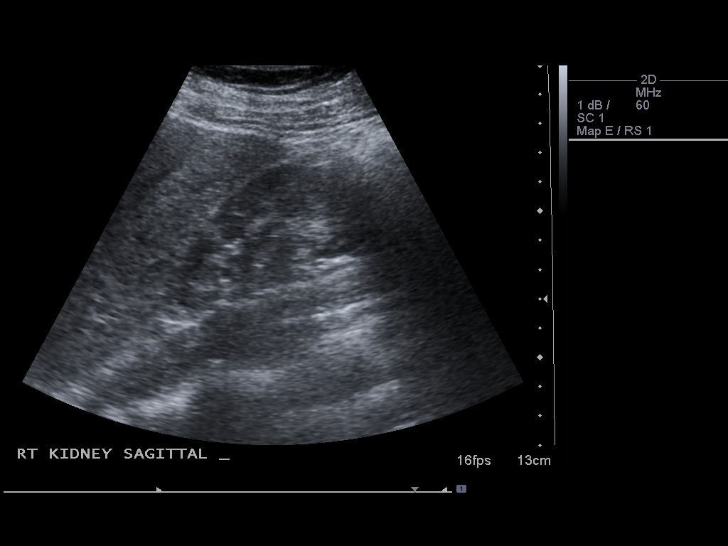
[im 9/34]
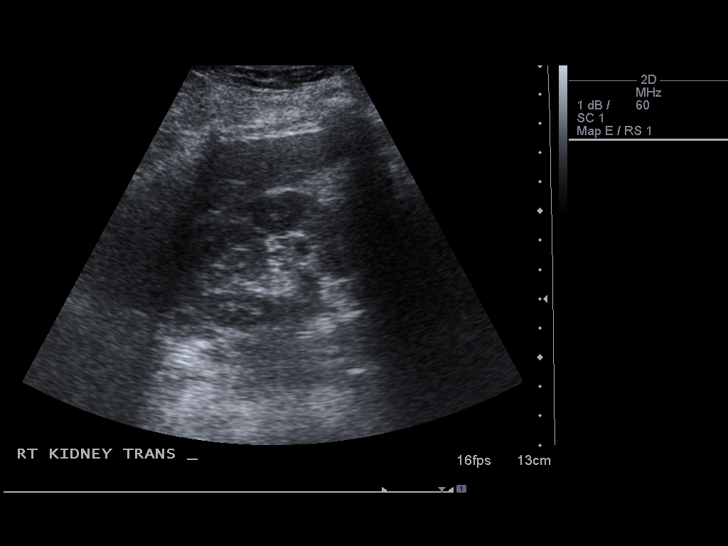
[im 12/34]
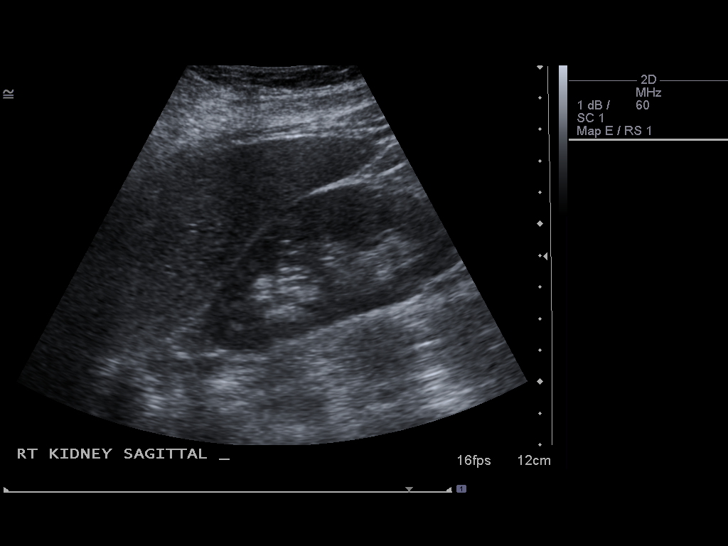
[im 13/34]
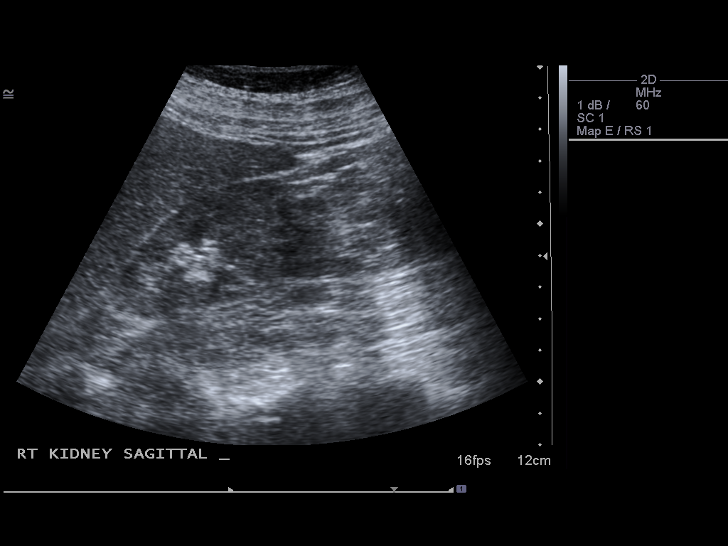
[im 16/34]
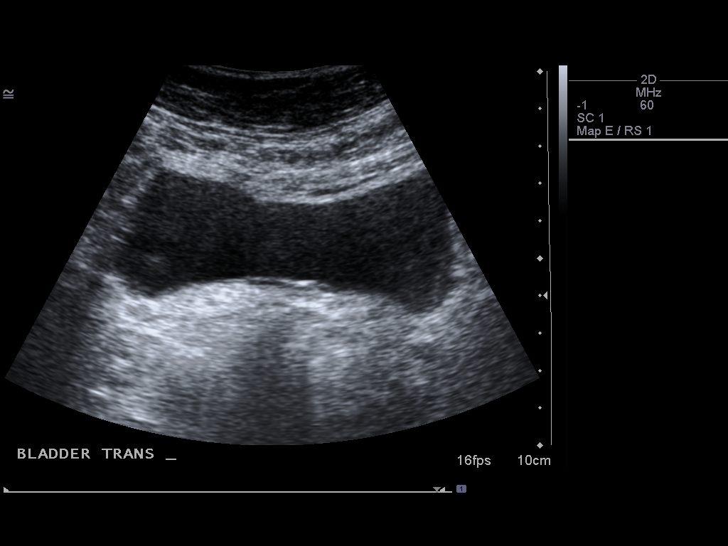
[im 18/34]
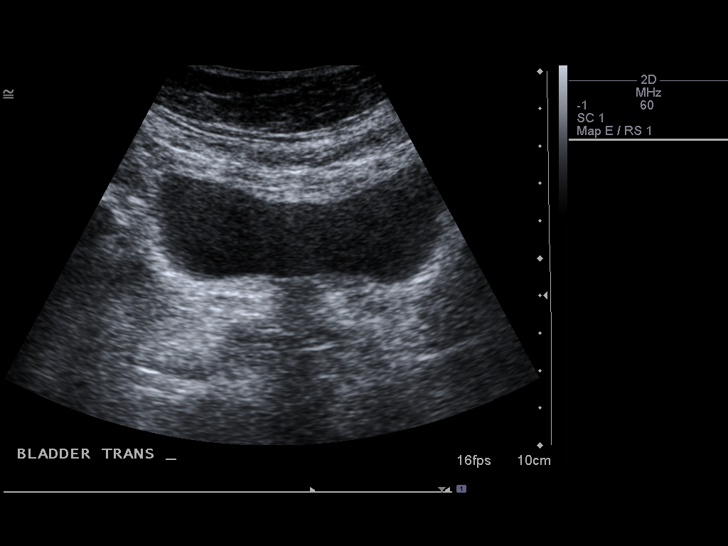
[im 21/34]
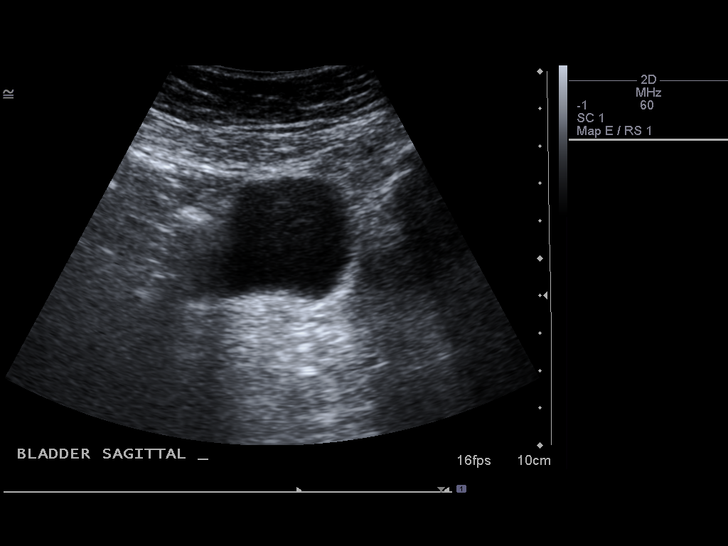
[im 23/34]
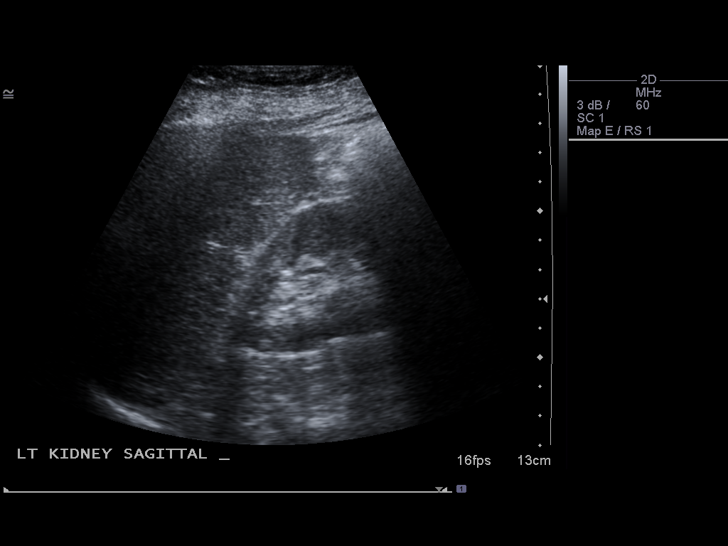
[im 25/34]
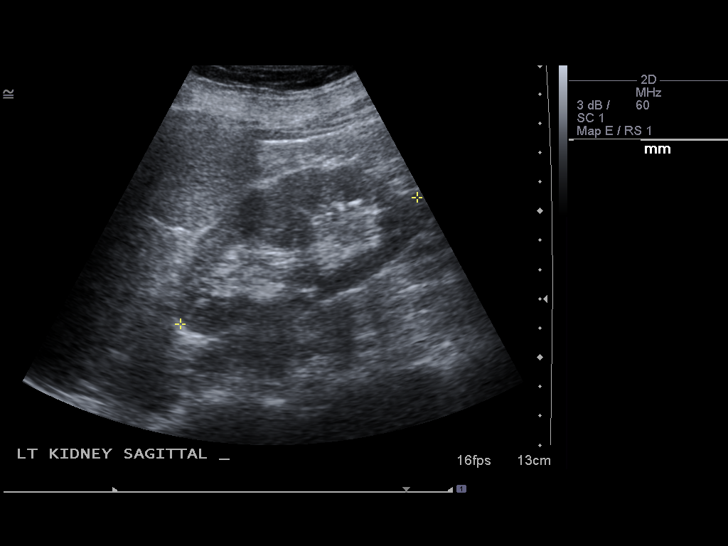
[im 28/34]
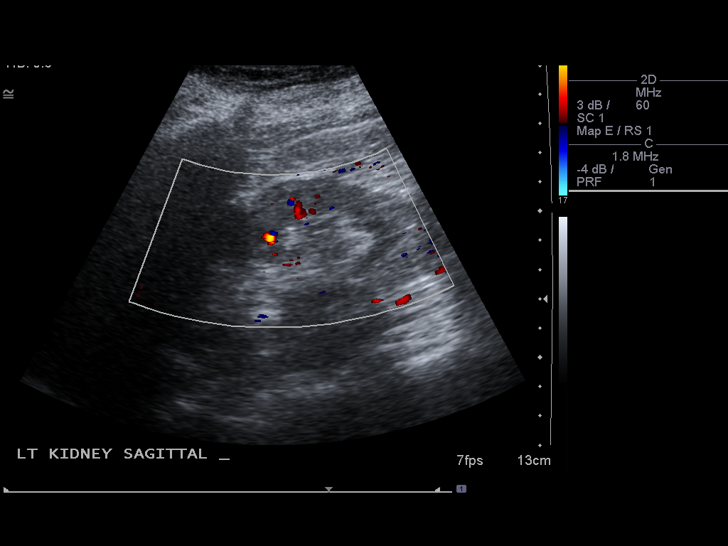
[im 31/34]
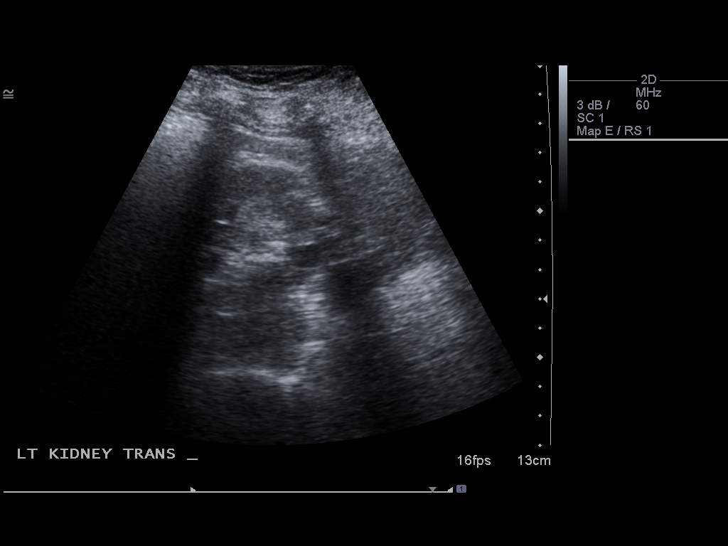
[im 34/34]
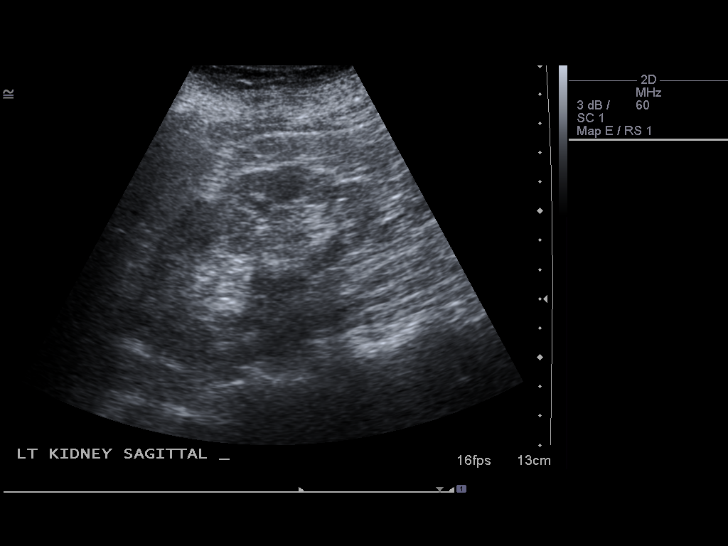

[14 of 25 positions shown; findings below may reference images not displayed]

FINDINGS: Right Kidney:  No hydronephrosis.  Renal length 9.2 cm.  Tiny small
cortical cyst, 7 mm.  Otherwise normal cortical echotexture.

Left Kidney:  No hydronephrosis.  Renal length 9.2 cm.  Tiny
cortical cyst, 8 x 11 mm.  Otherwise normal cortical echotexture.

Bladder:  Unremarkable.  No echogenic debris.
IMPRESSION: Normal renal ultrasound for age.

## 2011-09-17 IMAGING — CR DG CHEST 1V PORT
1 series · 1 of 1 positions shown · non-contrast
Comparison: 07/17/2010

CLINICAL DATA: Respiratory distress.  Status post CABG.

PORTABLE CHEST - 1 VIEW

[AP]
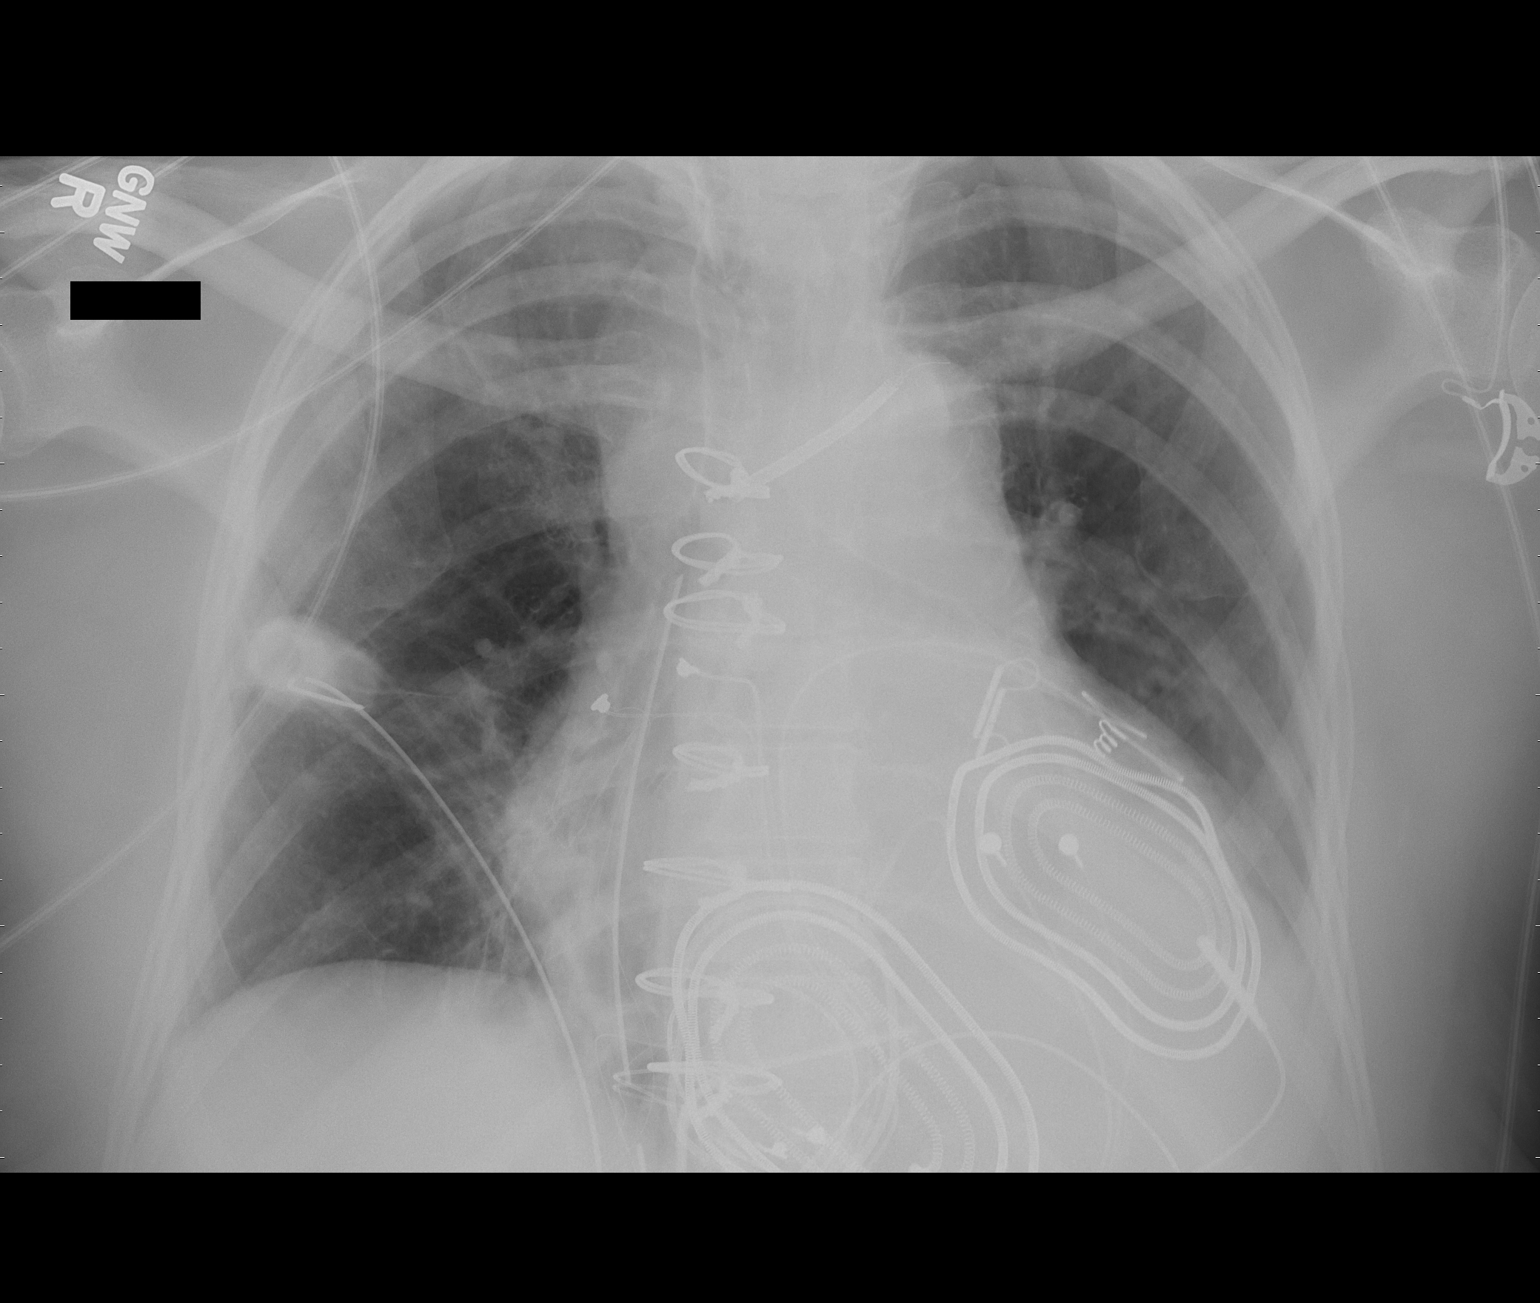

[1 of 1 positions shown; findings below may reference images not displayed]

FINDINGS: The patient has had median sternotomy and valve
replacement.  Epicardial pacing pads remain in place.  A
disconnected defibrillator lead overlies the right ventricle and
superior vena cava.  Two right chest tubes are in place.

The heart is enlarged.  There is dense opacity at the left lung
base, most consistent with atelectasis following extubation.  There
is mild pulmonary vascular congestion without overt edema. Left
pleural effusion is suspected.  No pneumothorax.
IMPRESSION: 1.  Cardiomegaly and vascular congestion.  No overt edema.
2.  Persistent left lower lobe opacity consistent with atelectasis
and effusion.

## 2011-09-18 IMAGING — CR DG CHEST 1V PORT
1 series · 1 of 1 positions shown · non-contrast
Comparison: 07/18/2010

CLINICAL DATA: Postop from mitral valve replacement.

PORTABLE CHEST - 1 VIEW

[view not recorded]
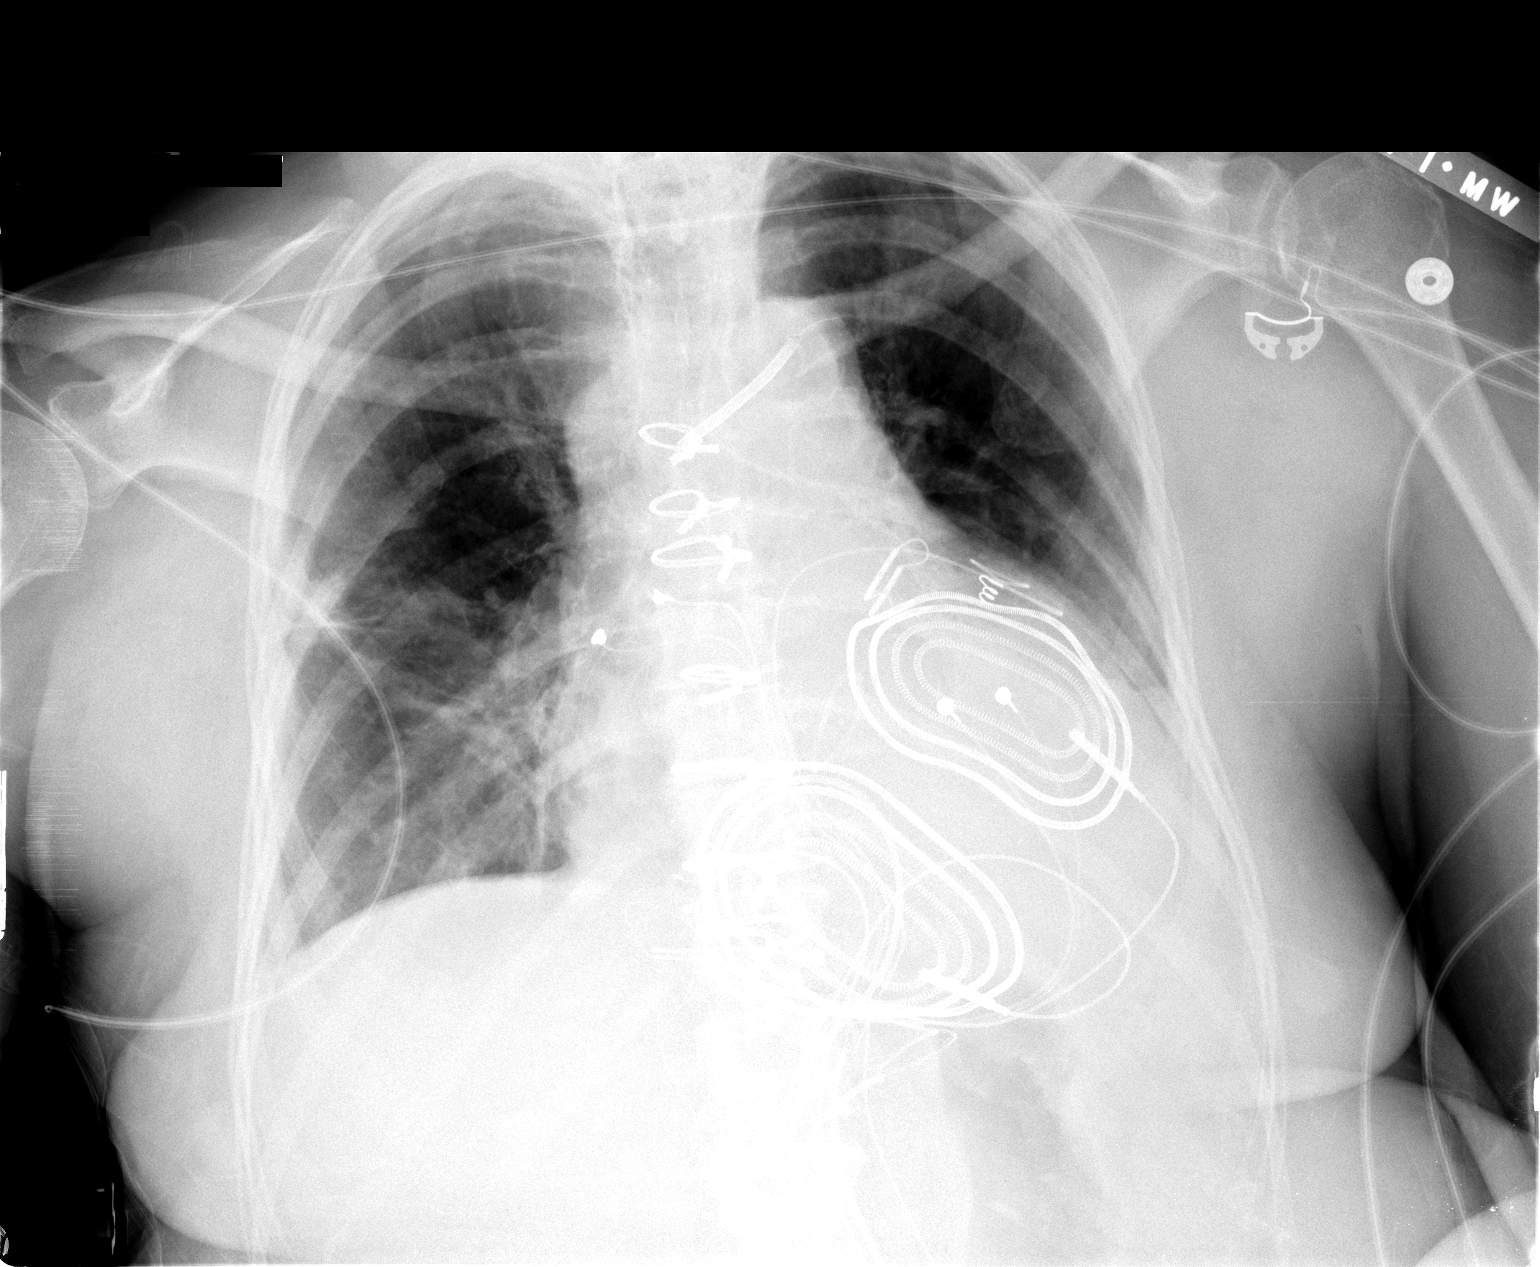

[1 of 1 positions shown; findings below may reference images not displayed]

FINDINGS: Right chest tube and mediastinal drain have been removed.
Left retrocardiac opacity and mild right lower lung atelectasis
show no significant change.  Heart size is stable.  No pneumothorax
identified.
IMPRESSION: 1.  No pneumothorax identified status post chest tube removal.
2.  Stable left retrocardiac opacity and mild right basilar
atelectasis.

## 2011-09-20 IMAGING — CR DG CHEST 1V PORT
1 series · 1 of 1 positions shown · non-contrast
Comparison: Portable chest x-ray of 07/19/2010

CLINICAL DATA: CABG, follow-up

PORTABLE CHEST - 1 VIEW

[view not recorded]
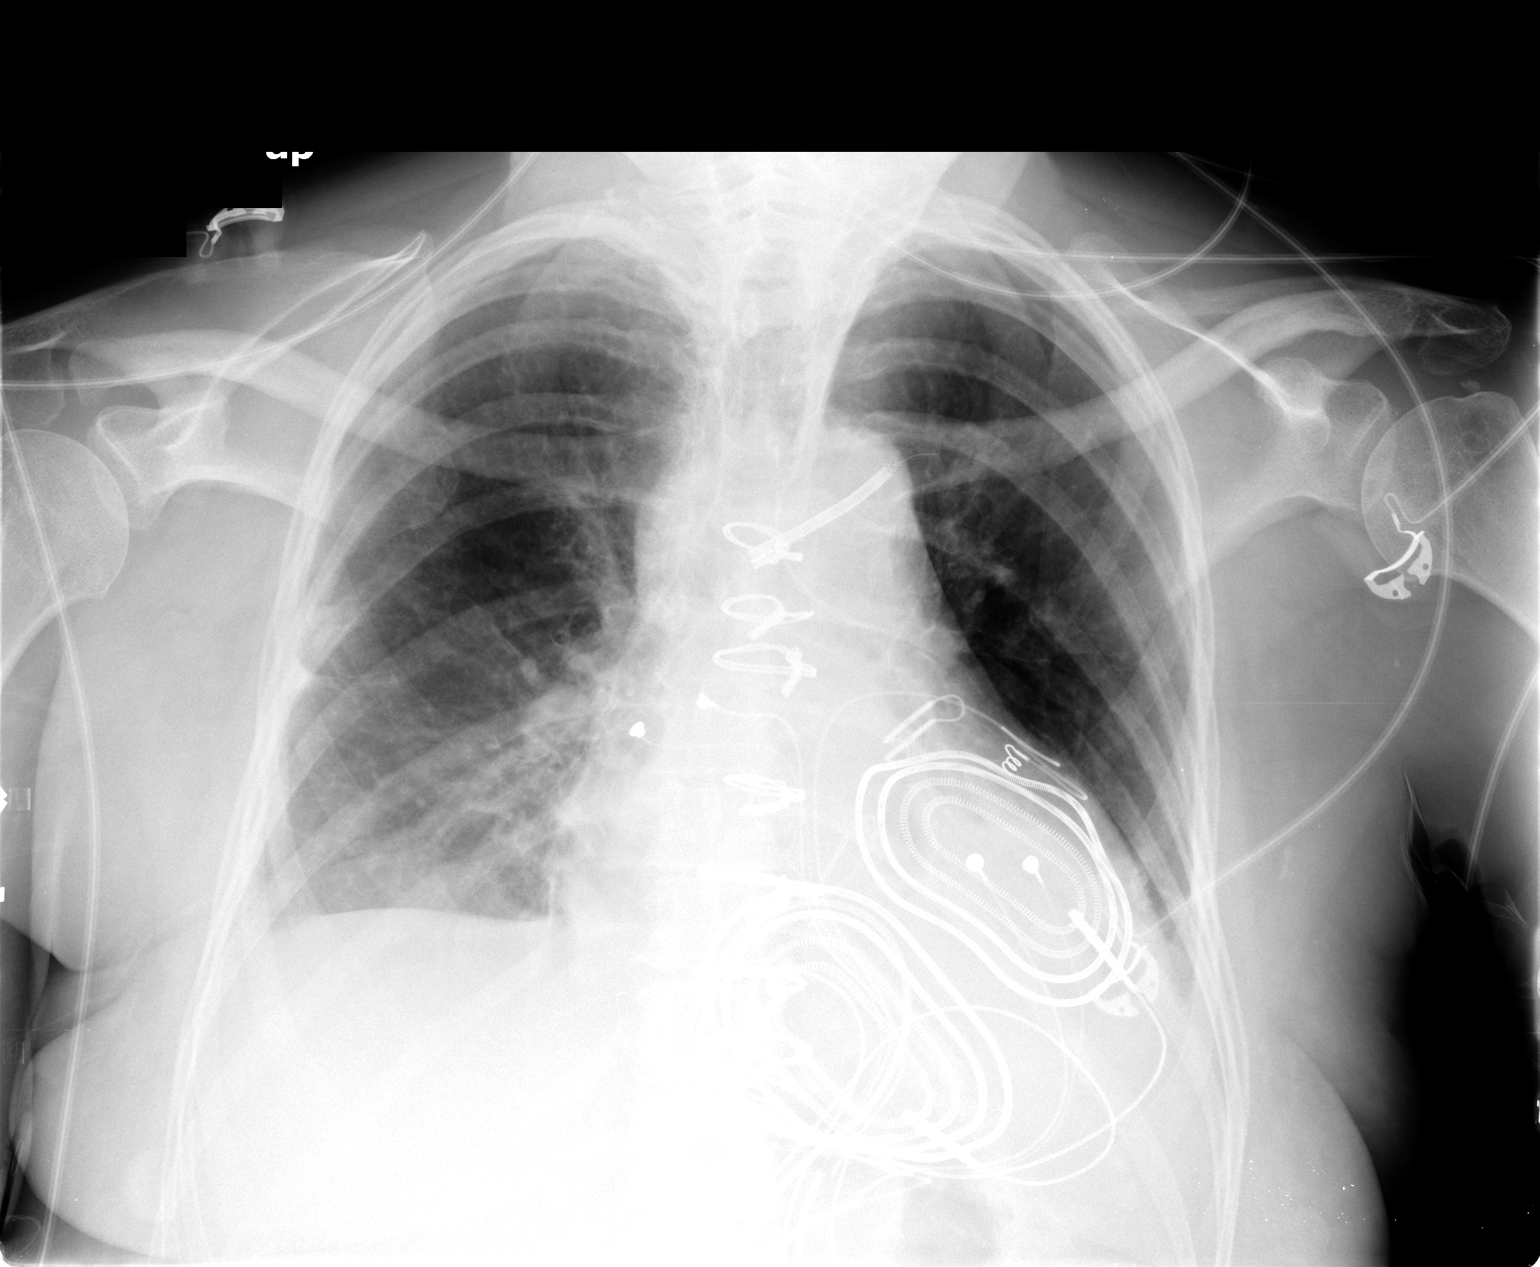

[1 of 1 positions shown; findings below may reference images not displayed]

FINDINGS: There is little change in haziness at the lung bases left
greater than right most consistent with atelectasis and effusions.
Cardiomegaly is stable.  There is little change in what appears to
be a disconnected defibrillator lead overlying the aortic knob.
Epicardial pacing pads remain.  No bony abnormality is seen.
IMPRESSION: 1.  No change in basilar opacities left greater than right
consistent with atelectasis, effusion, and possibly pneumonia.
2.  No change in apparently disconnected defibrillator lead
overlying the aortic knob.

## 2011-09-26 ENCOUNTER — Other Ambulatory Visit: Payer: Self-pay | Admitting: Cardiology

## 2012-03-24 ENCOUNTER — Encounter (HOSPITAL_COMMUNITY): Payer: Self-pay | Admitting: *Deleted

## 2012-03-24 ENCOUNTER — Other Ambulatory Visit (HOSPITAL_COMMUNITY): Payer: Medicare Other

## 2012-03-24 ENCOUNTER — Observation Stay (HOSPITAL_COMMUNITY)
Admission: EM | Admit: 2012-03-24 | Discharge: 2012-03-26 | Disposition: A | Discharge status: Home / Self Care | Payer: Medicare Other | Attending: Internal Medicine | Admitting: Internal Medicine

## 2012-03-24 ENCOUNTER — Emergency Department (HOSPITAL_COMMUNITY): Payer: Medicare Other

## 2012-03-24 DIAGNOSIS — Z9889 Other specified postprocedural states: Secondary | ICD-10-CM

## 2012-03-24 DIAGNOSIS — E785 Hyperlipidemia, unspecified: Secondary | ICD-10-CM | POA: Insufficient documentation

## 2012-03-24 DIAGNOSIS — Z8674 Personal history of sudden cardiac arrest: Secondary | ICD-10-CM | POA: Diagnosis present

## 2012-03-24 DIAGNOSIS — I509 Heart failure, unspecified: Secondary | ICD-10-CM | POA: Insufficient documentation

## 2012-03-24 DIAGNOSIS — I4891 Unspecified atrial fibrillation: Secondary | ICD-10-CM | POA: Insufficient documentation

## 2012-03-24 DIAGNOSIS — N390 Urinary tract infection, site not specified: Secondary | ICD-10-CM

## 2012-03-24 DIAGNOSIS — I129 Hypertensive chronic kidney disease with stage 1 through stage 4 chronic kidney disease, or unspecified chronic kidney disease: Secondary | ICD-10-CM | POA: Insufficient documentation

## 2012-03-24 DIAGNOSIS — R4789 Other speech disturbances: Secondary | ICD-10-CM | POA: Insufficient documentation

## 2012-03-24 DIAGNOSIS — G459 Transient cerebral ischemic attack, unspecified: Secondary | ICD-10-CM | POA: Insufficient documentation

## 2012-03-24 DIAGNOSIS — E669 Obesity, unspecified: Secondary | ICD-10-CM | POA: Insufficient documentation

## 2012-03-24 DIAGNOSIS — R4701 Aphasia: Secondary | ICD-10-CM | POA: Insufficient documentation

## 2012-03-24 DIAGNOSIS — I5023 Acute on chronic systolic (congestive) heart failure: Secondary | ICD-10-CM

## 2012-03-24 DIAGNOSIS — I10 Essential (primary) hypertension: Secondary | ICD-10-CM | POA: Diagnosis present

## 2012-03-24 DIAGNOSIS — Z9181 History of falling: Secondary | ICD-10-CM | POA: Insufficient documentation

## 2012-03-24 DIAGNOSIS — R262 Difficulty in walking, not elsewhere classified: Secondary | ICD-10-CM | POA: Insufficient documentation

## 2012-03-24 DIAGNOSIS — N179 Acute kidney failure, unspecified: Secondary | ICD-10-CM | POA: Insufficient documentation

## 2012-03-24 DIAGNOSIS — R531 Weakness: Secondary | ICD-10-CM

## 2012-03-24 DIAGNOSIS — G931 Anoxic brain damage, not elsewhere classified: Secondary | ICD-10-CM

## 2012-03-24 DIAGNOSIS — R569 Unspecified convulsions: Secondary | ICD-10-CM | POA: Insufficient documentation

## 2012-03-24 DIAGNOSIS — R29898 Other symptoms and signs involving the musculoskeletal system: Principal | ICD-10-CM | POA: Insufficient documentation

## 2012-03-24 DIAGNOSIS — I469 Cardiac arrest, cause unspecified: Secondary | ICD-10-CM

## 2012-03-24 DIAGNOSIS — R29810 Facial weakness: Secondary | ICD-10-CM | POA: Insufficient documentation

## 2012-03-24 DIAGNOSIS — D649 Anemia, unspecified: Secondary | ICD-10-CM | POA: Insufficient documentation

## 2012-03-24 DIAGNOSIS — Z95810 Presence of automatic (implantable) cardiac defibrillator: Secondary | ICD-10-CM | POA: Diagnosis present

## 2012-03-24 DIAGNOSIS — Z7901 Long term (current) use of anticoagulants: Secondary | ICD-10-CM | POA: Insufficient documentation

## 2012-03-24 DIAGNOSIS — I5022 Chronic systolic (congestive) heart failure: Secondary | ICD-10-CM | POA: Insufficient documentation

## 2012-03-24 DIAGNOSIS — N183 Chronic kidney disease, stage 3 unspecified: Secondary | ICD-10-CM | POA: Insufficient documentation

## 2012-03-24 DIAGNOSIS — Z79899 Other long term (current) drug therapy: Secondary | ICD-10-CM | POA: Insufficient documentation

## 2012-03-24 DIAGNOSIS — I428 Other cardiomyopathies: Secondary | ICD-10-CM | POA: Insufficient documentation

## 2012-03-24 DIAGNOSIS — M1990 Unspecified osteoarthritis, unspecified site: Secondary | ICD-10-CM

## 2012-03-24 LAB — CBC WITH DIFFERENTIAL/PLATELET
Basophils Absolute: 0 10*3/uL (ref 0.0–0.1)
Eosinophils Absolute: 0 10*3/uL (ref 0.0–0.7)
Eosinophils Relative: 1 % (ref 0–5)
MCH: 29.9 pg (ref 26.0–34.0)
MCV: 96.2 fL (ref 78.0–100.0)
Platelets: 178 10*3/uL (ref 150–400)
RDW: 14.9 % (ref 11.5–15.5)

## 2012-03-24 LAB — GLUCOSE, CAPILLARY: Glucose-Capillary: 106 mg/dL — ABNORMAL HIGH (ref 70–99)

## 2012-03-24 LAB — COMPREHENSIVE METABOLIC PANEL WITH GFR
ALT: 19 U/L (ref 0–35)
AST: 26 U/L (ref 0–37)
Albumin: 3.5 g/dL (ref 3.5–5.2)
Alkaline Phosphatase: 79 U/L (ref 39–117)
BUN: 43 mg/dL — ABNORMAL HIGH (ref 6–23)
CO2: 28 meq/L (ref 19–32)
Calcium: 9.4 mg/dL (ref 8.4–10.5)
Chloride: 100 meq/L (ref 96–112)
Creatinine, Ser: 2.47 mg/dL — ABNORMAL HIGH (ref 0.50–1.10)
GFR calc Af Amer: 21 mL/min — ABNORMAL LOW
GFR calc non Af Amer: 18 mL/min — ABNORMAL LOW
Glucose, Bld: 116 mg/dL — ABNORMAL HIGH (ref 70–99)
Potassium: 4.1 meq/L (ref 3.5–5.1)
Sodium: 138 meq/L (ref 135–145)
Total Bilirubin: 0.8 mg/dL (ref 0.3–1.2)
Total Protein: 7.4 g/dL (ref 6.0–8.3)

## 2012-03-24 LAB — RAPID URINE DRUG SCREEN, HOSP PERFORMED
Barbiturates: NOT DETECTED
Benzodiazepines: NOT DETECTED
Cocaine: NOT DETECTED
Tetrahydrocannabinol: NOT DETECTED

## 2012-03-24 LAB — URINALYSIS, ROUTINE W REFLEX MICROSCOPIC
Ketones, ur: NEGATIVE mg/dL
Leukocytes, UA: NEGATIVE
Nitrite: POSITIVE — AB
Urobilinogen, UA: 0.2 mg/dL (ref 0.0–1.0)
pH: 5 (ref 5.0–8.0)

## 2012-03-24 LAB — URINE MICROSCOPIC-ADD ON

## 2012-03-24 LAB — POCT I-STAT TROPONIN I: Troponin i, poc: 0 ng/mL (ref 0.00–0.08)

## 2012-03-24 LAB — APTT: aPTT: 34 s (ref 24–37)

## 2012-03-24 LAB — TROPONIN I: Troponin I: <0.3 ng/mL

## 2012-03-24 LAB — PROTIME-INR
INR: 2.21 — ABNORMAL HIGH (ref 0.00–1.49)
Prothrombin Time: 23.6 s — ABNORMAL HIGH (ref 11.6–15.2)

## 2012-03-24 MED ORDER — ACETAMINOPHEN 325 MG PO TABS: 650.0000 mg | ORAL_TABLET | ORAL | Status: DC | PRN

## 2012-03-24 MED ORDER — ONDANSETRON HCL 4 MG/2ML IJ SOLN
4.0000 mg | Freq: Once | INTRAMUSCULAR | Status: AC
  Administered 2012-03-24: 4 mg via INTRAVENOUS
  Filled 2012-03-24: qty 2

## 2012-03-24 MED ORDER — SIMVASTATIN 20 MG PO TABS
20.0000 mg | ORAL_TABLET | Freq: Every day | ORAL | Status: DC
  Administered 2012-03-25: 20 mg via ORAL
  Filled 2012-03-24 (×2): qty 1

## 2012-03-24 MED ORDER — VITAMIN D3 10 MCG (400 UNIT) PO CHEW: 400.0000 [IU] | CHEWABLE_TABLET | Freq: Every day | ORAL | Status: DC

## 2012-03-24 MED ORDER — ADULT MULTIVITAMIN W/MINERALS CH
1.0000 | ORAL_TABLET | Freq: Every day | ORAL | Status: DC
  Administered 2012-03-25 – 2012-03-26 (×2): 1 via ORAL
  Filled 2012-03-24 (×3): qty 1

## 2012-03-24 MED ORDER — CHOLECALCIFEROL 10 MCG (400 UNIT) PO TABS
400.0000 [IU] | ORAL_TABLET | Freq: Every day | ORAL | Status: DC
  Administered 2012-03-25 – 2012-03-26 (×2): 400 [IU] via ORAL
  Filled 2012-03-24 (×3): qty 1

## 2012-03-24 MED ORDER — CIPROFLOXACIN HCL 250 MG PO TABS
250.0000 mg | ORAL_TABLET | Freq: Two times a day (BID) | ORAL | Status: DC
  Administered 2012-03-24 – 2012-03-26 (×4): 250 mg via ORAL
  Filled 2012-03-24 (×6): qty 1

## 2012-03-24 MED ORDER — AMIODARONE HCL 200 MG PO TABS
200.0000 mg | ORAL_TABLET | Freq: Two times a day (BID) | ORAL | Status: DC
  Administered 2012-03-24 – 2012-03-26 (×4): 200 mg via ORAL
  Filled 2012-03-24 (×5): qty 1

## 2012-03-24 MED ORDER — WARFARIN SODIUM 4 MG PO TABS
4.0000 mg | ORAL_TABLET | ORAL | Status: AC
  Administered 2012-03-24: 4 mg via ORAL
  Filled 2012-03-24: qty 1

## 2012-03-24 MED ORDER — LORATADINE 10 MG PO TABS
10.0000 mg | ORAL_TABLET | Freq: Every day | ORAL | Status: DC
  Administered 2012-03-25 – 2012-03-26 (×2): 10 mg via ORAL
  Filled 2012-03-24 (×3): qty 1

## 2012-03-24 MED ORDER — METOPROLOL TARTRATE 12.5 MG HALF TABLET
12.5000 mg | ORAL_TABLET | Freq: Two times a day (BID) | ORAL | Status: DC
  Administered 2012-03-24 – 2012-03-25 (×2): 12.5 mg via ORAL
  Filled 2012-03-24 (×5): qty 1

## 2012-03-24 MED ORDER — FOLIC ACID 1 MG PO TABS
1.0000 mg | ORAL_TABLET | Freq: Every day | ORAL | Status: DC
  Administered 2012-03-25 – 2012-03-26 (×2): 1 mg via ORAL
  Filled 2012-03-24 (×3): qty 1

## 2012-03-24 MED ORDER — FESOTERODINE FUMARATE ER 4 MG PO TB24
4.0000 mg | ORAL_TABLET | Freq: Every day | ORAL | Status: DC
  Administered 2012-03-25 – 2012-03-26 (×2): 4 mg via ORAL
  Filled 2012-03-24 (×3): qty 1

## 2012-03-24 MED ORDER — WARFARIN - PHARMACIST DOSING INPATIENT: Freq: Every day | Status: DC

## 2012-03-24 MED ORDER — SODIUM CHLORIDE 0.9 % IV SOLN
INTRAVENOUS | Status: AC
  Administered 2012-03-24 – 2012-03-25 (×2): via INTRAVENOUS

## 2012-03-24 MED ORDER — SODIUM CHLORIDE 0.9 % IV SOLN
Freq: Once | INTRAVENOUS | Status: AC
  Administered 2012-03-24: 18:00:00 via INTRAVENOUS

## 2012-03-24 MED ORDER — CALCIUM CARBONATE ANTACID 500 MG PO CHEW
2.0000 | CHEWABLE_TABLET | Freq: Every day | ORAL | Status: DC
  Administered 2012-03-25 – 2012-03-26 (×2): 400 mg via ORAL
  Filled 2012-03-24 (×3): qty 2

## 2012-03-24 MED ORDER — FLUTICASONE PROPIONATE 50 MCG/ACT NA SUSP
1.0000 | Freq: Every day | NASAL | Status: DC
  Administered 2012-03-24 – 2012-03-25 (×2): 1 via NASAL
  Filled 2012-03-24: qty 16

## 2012-03-24 NOTE — Progress Notes (Signed)
3:56 PM Pt is 71 yo woman with Hx heart surgery, AF, on coumadin, who had a 5 minute episode of inability to speak and right sided weakness which has resolved, occurring appx 2 hours ago.  Exam now shows her awake and alert, speech fluent, no sensory or motor deficit.  Lab workup for TIA ordered.  Consult with Neurology requested.   6:17 PM Lab workup did not show a stroke.  Pt seen by neurology --> pt has too many comorbidities to go to the TIA CDU protocol.  Call to [facility] to admit her.

## 2012-03-24 NOTE — H&P (Signed)
Patient's PCP: Allean Found, MD Patient's cardiologist: Dr. Donnie Aho Patient's EP: Dr. Graciela Husbands  Chief Complaint: Right-sided weakness  History of Present Illness: Margaret [name] is a 71 y.o. Caucasian female with history of nonischemic cardiomyopathy and chronic systolic congestive heart failure with an ejection fraction of 35%-40%, A. fib on chronic anticoagulation, hypertension, hyperlipidemia, recurrent urinary tract infections, and obesity who presents with the above complaints.  Patient at home chronically uses walker to get around the house and uses a wheelchair out of the house.  Late morning around noon patient had an episode of diarrhea, but she was attempting to get off the commode when she fell and landed on her bottom.  Patient's daughter assisted her to the tub and washed the patient.  She did not notice any injury.  Around 1 o'clock this afternoon while the patient was ambulating down the hallway daughter noticed patient slumping over on the right side with right sided weakness (right upper and lower extremity), and right facial droop.  Symptoms lasted for less than a few minutes and all of patient's symptoms resolved. She was brought to the emergency department for further evaluation, she was evaluated by neurology Dr. Amada Jupiter and recommended hospitalization for further management.  Patient denies any recent fevers, chills, nausea, vomiting, chest pain, abdominal pain, headaches or vision changes.  Patient has chronic shortness of breath with activity which is unchanged.  Other than the episode of diarrhea this morning has had no further episodes.  Past Medical History  Diagnosis Date  . NICM (nonischemic cardiomyopathy)     EF 35%; cath 2/12 no CAD  . Systolic CHF, chronic   . Severe mitral regurgitation     s/p complex mitral valve repair with cox maze + LAA clipping, removal of RV lead, and implantation of CRT-D in abdomen  . Cardiac arrest - ventricular fibrillation    in 1990s  . Atrial fibrillation     amiodarone  . HTN (hypertension)   . HLD (hyperlipidemia)   . Achalasia     s/p dilation  . Recurrent UTI   . Obesity   . Arthritis   . Anoxic brain injury 1996    s/p cardiac arrest    Past Surgical History  Procedure Date  . Defibrillator generator explantation and reimplantation; and 08/02/2001  . Device migration with anticipated pocket revision 10/29/2003  . 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 Attempted implantation of an implantable cardioverter- 12/30/2004  . Cardioversion 04/14/2010  . Median sternotomy 07/16/2010  . Mitral valve repair 07/16/2010  . Cox maze procedure (complete biatrial lesion set with clipping of 07/16/2010  . Removal of old endocardial right ventricular defibrillator lead. 07/16/2010  . Placement of dual chamber pacemaker and implantable cardiac 07/16/2010  . Placement of swan ganz pulmonary artery catheter via left femoral access. 07/16/2010   Family History  Problem Relation Age of Onset  . Stroke Mother   . Lung cancer Father    History   Social History  . Marital Status: Married    Spouse Name: N/A    Number of Children: N/A  . Years of Education: N/A   Occupational History  . Not on file.   Social History Main Topics  . Smoking status: Never Smoker   . Smokeless tobacco: Not on file  . Alcohol Use: No  . Drug Use: No  . Sexually Active: Not on file   Other Topics Concern  . Not on file   Social History  Narrative  . No narrative on file   Allergies: Review of patient's allergies indicates no known allergies.  Home Meds: Prior to Admission medications   Medication Sig Start Date End Date Taking? Authorizing Provider  amiodarone (PACERONE) 200 MG tablet Take 200 mg by mouth 2 (two) times daily.   Yes Historical Provider, MD  calcium carbonate (TUMS - DOSED IN MG ELEMENTAL CALCIUM) 500 MG chewable tablet Chew 2 tablets by mouth daily.   Yes  Historical Provider, MD  Cholecalciferol (VITAMIN D3) 400 UNITS CHEW Chew 400 Units by mouth daily.   Yes Historical Provider, MD  CLARINEX 5 MG tablet Take 1 tablet by mouth daily. 08/12/10  Yes Historical Provider, MD  DETROL LA 4 MG 24 hr capsule Take 1 tablet by mouth daily. 08/31/10  Yes Historical Provider, MD  fluticasone (FLONASE) 50 MCG/ACT nasal spray 1 drop by Nasal route daily. 06/26/10  Yes Historical Provider, MD  folic acid (FOLVITE) 1 MG tablet Take 1 tablet by mouth daily. 08/31/10  Yes Historical Provider, MD  furosemide (LASIX) 80 MG tablet Take 40 mg by mouth 2 (two) times daily.    Yes Historical Provider, MD  KLOR-CON M20 20 MEQ tablet Take 1 tablet by mouth daily. 08/05/10  Yes Historical Provider, MD  lovastatin (MEVACOR) 40 MG tablet Take 1 tablet by mouth daily. 08/12/10  Yes Historical Provider, MD  metoprolol tartrate (LOPRESSOR) 25 MG tablet Take 12.5 mg by mouth Twice daily.  08/31/10  Yes Historical Provider, MD  Multiple Vitamin (MULTIVITAMIN WITH MINERALS) TABS Take 1 tablet by mouth daily.   Yes Historical Provider, MD  warfarin (COUMADIN) 4 MG tablet Take 1 tablet by mouth every evening.  08/31/10  Yes Historical Provider, MD    Review of Systems: All systems reviewed with the patient and positive as per history of present illness, otherwise all other systems are negative.  Physical Exam: Blood pressure 102/46, pulse 70, temperature 97.9 F (36.6 C), temperature source Oral, resp. rate 14, height 5' 5.5" (1.664 m), weight 90.719 kg (200 lb), SpO2 100.00%. General: Awake, Oriented x3, No acute distress. HEENT: EOMI, slightly dry mucous membranes Neck: Supple CV: S1 and S2 Lungs: Clear to ascultation bilaterally Abdomen: Soft, Nontender, Nondistended, +bowel sounds. Ext: Good pulses. 1+ LE edema. No clubbing or cyanosis noted. Neuro: Cranial Nerves II-XII grossly intact. Has 5/5 motor strength in upper and lower extremities.  Lab results:  [facility] 03/24/12 1549    NA 138  K 4.1  CL 100  CO2 28  GLUCOSE 116*  BUN 43*  CREATININE 2.47*  CALCIUM 9.4  MG --  PHOS --    Basename 03/24/12 1549  AST 26  ALT 19  ALKPHOS 79  BILITOT 0.8  PROT 7.4  ALBUMIN 3.5   No results found for this basename: LIPASE:2,AMYLASE:2 in the last 72 hours  Basename 03/24/12 1549  WBC 6.4  NEUTROABS 5.2  HGB 10.1*  HCT 32.5*  MCV 96.2  PLT 178    Basename 03/24/12 1549  CKTOTAL --  CKMB --  CKMBINDEX --  TROPONINI <0.30   No components found with this basename: POCBNP:3 No results found for this basename: DDIMER in the last 72 hours No results found for this basename: HGBA1C:2 in the last 72 hours No results found for this basename: CHOL:2,HDL:2,LDLCALC:2,TRIG:2,CHOLHDL:2,LDLDIRECT:2 in the last 72 hours No results found for this basename: TSH,T4TOTAL,FREET3,T3FREE,THYROIDAB in the last 72 hours No results found for this basename: VITAMINB12:2,FOLATE:2,FERRITIN:2,TIBC:2,IRON:2,RETICCTPCT:2 in the last 72 hours Imaging results:  Ct Head  Wo Contrast  03/24/2012  *RADIOLOGY REPORT*  Clinical Data: Right-sided weakness and slurred speech.  Associated headache.  CT HEAD WITHOUT CONTRAST  Technique:  Contiguous axial images were obtained from the base of the skull through the vertex without contrast.  Comparison: None.  Findings: Fluid of CSF density anterior to the tip of the left temporal lobe appears to represent an arachnoid cyst.  Moderately severe small vessel ischemic changes are present in the periventricular white matter.  Moderate cortical atrophy is present.  There is no evidence of acute hemorrhage, extra-axial fluid collection, mass effect, hydrocephalus or acute infarction. No evidence of mass lesion.  The skull is normal in appearance.  IMPRESSION: Moderately severe small vessel disease.  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   Original Report Authenticated By: Irish Lack, M.D.    Other  results: EKG: Left bundle branch block with heart rate of 75.  Assessment & Plan by Problem: Right-sided weakness Resolved, etiology unclear.  Initiate TIA workup as symptoms resolved.  Head CT on admission shows moderately severe small vessel disease, no acute intracranial etiology.  Head CT also showed fluid anterior to the left temporal lobe tip likely represents a benign arachnoid cyst.  Patient cannot get an MRI of the brain as she has pacemaker/defibrillator.  Will get a 2-D echocardiogram and carotid Dopplers to complete the workup.  No aspirin as patient already anticoagulated on Coumadin.  Will request PT/OT consultation.  Discussed with Dr. Amada Jupiter, neurology, may need outpatient EEG with outpatient neurology for possible seizure consideration.  Acute renal failure on chronic kidney disease stage III Etiology unclear.  Baseline creatinine 1.3.  Patient does report that a few months ago her Lasix dose was increased due to peripheral edema patient has 1+ lower extremity edema.  Generally hydrate the patient for 12 hours till 7 o'clock in the morning and reassess volume status and renal function at that time.  Possible urinary tract infection Patient not complaining of any dysuria but has had recurrent urinary tract infections.  Urine analysis positive for nitrates but negative for leukocytes, only 0-2 WBC, and many bacteria noted.  Will treat empirically with ciprofloxacin 250 mg twice daily for 3 days.  If urine culture negative discontinue antibiotics.  Chronic systolic congestive heart failure Patient appears compensated.  Continue home medications except Lasix which has been held due to acute renal failure.  Reassess volume status and renal function to determine if patient needs to be restarted on Lasix with potassium in the morning.  A. Fib Rate controlled.  Continue anticoagulation on Coumadin.  Anemia Send anemia panel in the morning.  Suspect is likely due to chronic disease  and chronic kidney disease.  Hypertension Stable.  Continue antihypertensive medications.  Hyperlipidemia Continue statin.  Obesity Management as per primary care physician.  Stable.  Generalized weakness PT/OT consultation requested.  Diarrhea Only one episode.  Maybe due to possible viral gastroenteritis.  Initiate further workup if patient has recurrent episodes.  Prophylaxis Therapeutic INR on Coumadin.  CODE STATUS Partial code.  Patient indicates that she does want to have CPR and chest compressions done (though has a defibrillator) if she needs it.  She does not wish to be put on life support/mechanical ventilation even temporarily.  This was discussed with patient, husband, and daughter at the time of admission.  Disposition Admit the patient as observation to telemetry.  Time spent on admission, talking to the patient, and coordinating care was: 60 mins.  Khaya Theissen A, MD 03/24/2012, 7:38 PM

## 2012-03-24 NOTE — ED Notes (Signed)
ems reports stroke screen was negative,  cbg 112

## 2012-03-24 NOTE — Progress Notes (Signed)
ANTICOAGULATION CONSULT NOTE - Initial Consult  Pharmacy Consult for coumadin Indication: atrial fibrillation  No Known Allergies  Patient Measurements: Height: 5' 5.5" (166.4 cm) Weight: 200 lb (90.719 kg) IBW/kg (Calculated) : 58.15  Heparin Dosing Weight:   Vital Signs: Temp: 97.9 F (36.6 C) (11/30 1615) Temp src: Oral (11/30 1358) BP: 114/48 mmHg (11/30 1730) Pulse Rate: 73  (11/30 1730)  Labs:  [facility] 03/24/12 1549  HGB 10.1*  HCT 32.5*  PLT 178  APTT 34  LABPROT 23.6*  INR 2.21*  HEPARINUNFRC --  CREATININE 2.47*  CKTOTAL --  CKMB --  TROPONINI <0.30    Estimated Creatinine Clearance: 23.1 ml/min (by C-G formula based on Cr of 2.47).   Medical History: Past Medical History  Diagnosis Date  . NICM (nonischemic cardiomyopathy)     EF 35%; cath 2/12 no CAD  . Systolic CHF, chronic   . Severe mitral regurgitation     s/p complex mitral valve repair with cox maze + LAA clipping, removal of RV lead, and implantation of CRT-D in abdomen  . Cardiac arrest - ventricular fibrillation     in 1990s  . Atrial fibrillation     amiodarone  . HTN (hypertension)   . HLD (hyperlipidemia)   . Achalasia     s/p dilation  . Recurrent UTI   . Obesity   . Arthritis   . Anoxic brain injury 1996    s/p cardiac arrest     Medications:  Scheduled:    . [COMPLETED] sodium chloride   Intravenous Once  . amiodarone  200 mg Oral BID  . calcium carbonate  2 tablet Oral Daily  . cholecalciferol  400 Units Oral Daily  . ciprofloxacin  250 mg Oral BID  . fesoterodine  4 mg Oral Daily  . fluticasone  1 spray Each Nare Daily  . folic acid  1 mg Oral Daily  . loratadine  10 mg Oral Daily  . metoprolol tartrate  12.5 mg Oral BID  . multivitamin with minerals  1 tablet Oral Daily  . [COMPLETED] ondansetron (ZOFRAN) IV  4 mg Intravenous Once  . simvastatin  20 mg Oral q1800  . [DISCONTINUED] Vitamin D3  400 Units Oral Daily   Infusions:    . sodium chloride       Assessment: 71 yo female with afib will be continued on coumadin therapy.  Home coumadin dose was 4mg  po qday.  INR today is 2.21.  Last dose was 03/23/12. On cipro now. Goal of Therapy:  INR 2-3    Plan:  1) Coumadin 4mg  po x1 2) Daily PT/INR  Margaret [name], Margaret [name] 03/24/2012,9:15 PM

## 2012-03-24 NOTE — ED Provider Notes (Signed)
History     CSN: [id number]  Arrival date & time 03/24/12  1353   First MD Initiated Contact with Patient 03/24/12 1533      Chief Complaint  Patient presents with  . Weakness    (Consider location/radiation/quality/duration/timing/severity/associated sxs/prior treatment) The history is provided by the patient, medical records, the spouse and a relative.    Margaret [name] is a 71 y.o. female since the emergency department complaining of strokelike symptoms. Patient states that she was at home getting off the toilet when she fell and landed on her bottom. The daughter states that she was assisted to her feet without evidence of injury and at normal baseline at that time.  Approx later she was ambulating down the hallway when she experienced a sudden onset of complete R sided weakness, R sided facial droop, slurred speech and decreased responsiveness.  Daughter states this lasted approx 3-4 min with complete resolution by the time EMS arrived.  Pt with a Hx a-fib, cardiac arrest with subsequent anoxic brain injury, and pacemaker/ICD implantation and revision.  She has a Medtronic Protecta XT DR pacemaker defibrillator implanted into the abd. Pt Cardiologist Dr Donnie Aho and Dr Graciela Husbands did the ICD implantation.  Last PT/INR was high and pt was to eat more greens which she has done.    Past Medical History  Diagnosis Date  . NICM (nonischemic cardiomyopathy)     EF 35%; cath 2/12 no CAD  . Systolic CHF, chronic   . Severe mitral regurgitation     s/p complex mitral valve repair with cox maze + LAA clipping, removal of RV lead, and implantation of CRT-D in abdomen  . Cardiac arrest - ventricular fibrillation     in 1990s  . Atrial fibrillation     amiodarone  . HTN (hypertension)   . HLD (hyperlipidemia)   . Achalasia     s/p dilation  . Recurrent UTI   . Obesity   . Arthritis   . Anoxic brain injury 1996    s/p cardiac arrest     Past Surgical History  Procedure Date  .  Defibrillator generator explantation and reimplantation; and 08/02/2001  . Device migration with anticipated pocket revision 10/29/2003  . 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 Attempted implantation of an implantable cardioverter- 12/30/2004  . Cardioversion 04/14/2010  . Median sternotomy 07/16/2010  . Mitral valve repair 07/16/2010  . Cox maze procedure (complete biatrial lesion set with clipping of 07/16/2010  . Removal of old endocardial right ventricular defibrillator lead. 07/16/2010  . Placement of dual chamber pacemaker and implantable cardiac 07/16/2010  . Placement of swan ganz pulmonary artery catheter via left femoral access. 07/16/2010    No family history on file.  History  Substance Use Topics  . Smoking status: Never Smoker   . Smokeless tobacco: Not on file  . Alcohol Use: No    OB History    Grav Para Term Preterm Abortions TAB SAB Ect Mult Living                  Review of Systems  Constitutional: Negative for fever, diaphoresis, appetite change, fatigue and unexpected weight change.  HENT: Negative for mouth sores and neck stiffness.   Eyes: Negative for visual disturbance.  Respiratory: Negative for cough, chest tightness, shortness of breath and wheezing.   Cardiovascular: Negative for chest pain.  Gastrointestinal: Positive for nausea. Negative for vomiting, abdominal pain, diarrhea and constipation.  Genitourinary: Negative  for dysuria, urgency, frequency and hematuria.  Skin: Negative for rash.  Neurological: Positive for facial asymmetry, speech difficulty, weakness and light-headedness. Negative for syncope and headaches.  Psychiatric/Behavioral: Negative for sleep disturbance. The patient is not nervous/anxious.     Allergies  Review of patient's allergies indicates no known allergies.  Home Medications   Current Outpatient Rx  Name  Route  Sig  Dispense  Refill  . AMIODARONE HCL 200 MG PO TABS    Oral   Take 200 mg by mouth 2 (two) times daily.         Marland Kitchen CALCIUM CARBONATE ANTACID 500 MG PO CHEW   Oral   Chew 2 tablets by mouth daily.         Marland Kitchen VITAMIN D3 400 UNITS PO CHEW   Oral   Chew 400 Units by mouth daily.         Marland Kitchen CLARINEX 5 MG PO TABS   Oral   Take 1 tablet by mouth daily.         Marland Kitchen DETROL LA 4 MG PO CP24   Oral   Take 1 tablet by mouth daily.         Marland Kitchen FLUTICASONE PROPIONATE 50 MCG/ACT NA SUSP   Nasal   1 drop by Nasal route daily.         Marland Kitchen FOLIC ACID 1 MG PO TABS   Oral   Take 1 tablet by mouth daily.         . FUROSEMIDE 80 MG PO TABS   Oral   Take 40 mg by mouth 2 (two) times daily.          Marland Kitchen KLOR-CON M20 20 MEQ PO TBCR   Oral   Take 1 tablet by mouth daily.         Marland Kitchen LOVASTATIN 40 MG PO TABS   Oral   Take 1 tablet by mouth daily.         Marland Kitchen METOPROLOL TARTRATE 25 MG PO TABS   Oral   Take 12.5 mg by mouth Twice daily.          . ADULT MULTIVITAMIN W/MINERALS CH   Oral   Take 1 tablet by mouth daily.         . WARFARIN SODIUM 4 MG PO TABS   Oral   Take 1 tablet by mouth every evening.            BP 102/46  Pulse 70  Temp 97.9 F (36.6 C) (Oral)  Resp 14  Ht 5' 5.5" (1.664 m)  Wt 200 lb (90.719 kg)  BMI 32.78 kg/m2  SpO2 100%  Physical Exam  Nursing note and vitals reviewed. Constitutional: She is oriented to person, place, and time. She appears well-developed and well-nourished. No distress.  HENT:  Head: Normocephalic and atraumatic.  Mouth/Throat: Oropharynx is clear and moist. No oropharyngeal exudate.  Eyes: Conjunctivae normal and EOM are normal. Pupils are equal, round, and reactive to light. No scleral icterus.  Neck: Normal range of motion. Neck supple.  Cardiovascular: Normal rate, regular rhythm, normal heart sounds and intact distal pulses.   Pulmonary/Chest: Effort normal and breath sounds normal. No respiratory distress. She has no wheezes.  Abdominal: Soft. Bowel sounds are normal. She  exhibits no mass. There is no tenderness. There is no rebound and no guarding.  Musculoskeletal: Normal range of motion. She exhibits no edema.  Lymphadenopathy:    She has no cervical adenopathy.  Neurological: She is alert and  oriented to person, place, and time. She has normal reflexes. No cranial nerve deficit. She exhibits normal muscle tone. Coordination normal.       Speech is clear and goal oriented, follows commands Major Cranial nerves without deficit, no facial droop Normal strength in upper and lower extremities bilaterally including dorsiflexion and plantar flexion, strong and equal grip strength Sensation normal to light and sharp touch Moves extremities without ataxia, coordination intact Normal finger to nose and rapid alternating movements Neg modified romberg, no pronator drift  Skin: Skin is warm and dry. No rash noted. She is not diaphoretic. No erythema.       Small areas of ecchymosis over arms and legs  Psychiatric: She has a normal mood and affect.    ED Course  Procedures (including critical care time)  Labs Reviewed  CBC WITH DIFFERENTIAL - Abnormal; Notable for the following:    RBC 3.38 (*)     Hemoglobin 10.1 (*)     HCT 32.5 (*)     Neutrophils Relative 80 (*)     All other components within normal limits  COMPREHENSIVE METABOLIC PANEL - Abnormal; Notable for the following:    Glucose, Bld 116 (*)     BUN 43 (*)     Creatinine, Ser 2.47 (*)     GFR calc non Af Amer 18 (*)     GFR calc Af Amer 21 (*)     All other components within normal limits  PROTIME-INR - Abnormal; Notable for the following:    Prothrombin Time 23.6 (*)     INR 2.21 (*)     All other components within normal limits  URINALYSIS, ROUTINE W REFLEX MICROSCOPIC - Abnormal; Notable for the following:    Hgb urine dipstick SMALL (*)     Nitrite POSITIVE (*)     All other components within normal limits  GLUCOSE, CAPILLARY - Abnormal; Notable for the following:    Glucose-Capillary  106 (*)     All other components within normal limits  URINE MICROSCOPIC-ADD ON - Abnormal; Notable for the following:    Bacteria, UA MANY (*)     Casts HYALINE CASTS (*)     All other components within normal limits  TROPONIN I  APTT  POCT I-STAT TROPONIN I   Ct Head Wo Contrast  03/24/2012  *RADIOLOGY REPORT*  Clinical Data: Right-sided weakness and slurred speech.  Associated headache.  CT HEAD WITHOUT CONTRAST  Technique:  Contiguous axial images were obtained from the base of the skull through the vertex without contrast.  Comparison: None.  Findings: Fluid of CSF density anterior to the tip of the left temporal lobe appears to represent an arachnoid cyst.  Moderately severe small vessel ischemic changes are present in the periventricular white matter.  Moderate cortical atrophy is present.  There is no evidence of acute hemorrhage, extra-axial fluid collection, mass effect, hydrocephalus or acute infarction. No evidence of mass lesion.  The skull is normal in appearance.  IMPRESSION: Moderately severe small vessel disease.  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   Original Report Authenticated By: Irish Lack, M.D.     ECG:  Date: 03/24/2012  Rate: 75  Rhythm: normal sinus rhythm and electonically paced  QRS Axis: left  Intervals: normal  ST/T Wave abnormalities: indeterminate  Conduction Disutrbances:paced  Narrative Interpretation: Paced rhythm   Old EKG Reviewed: changes noted and Old ECG pre-pacemaker    1. Transient ischemic attack  MDM  Margaret [name] presents after TIA symptoms.  Concern for TIA very high.  Pt without neuro deficits at this time, however pt with risk factors for CVA. Pt is on coumadin for a-fib and has a hx of Cardiac arrest with subsequent anoxic brain injury.  Case discussed with Neurology who is requesting an MRI however after discussion with the MRI tech, the type of pacemaker/ICD is not  compatible with the MRI.  Ct scan pending.    Dr. Carleene Cooper was consulted, evaluated this patient with me and agrees with the plan.    CT scan without acute abnormality.  Pt will be admitted to the hospitalist service with neurology consulting.          Dahlia Client Diala Waxman, PA-C 03/24/12 1841

## 2012-03-24 NOTE — ED Notes (Signed)
PT reports she fell on bottom from commode; husband reports she has no balance.

## 2012-03-24 NOTE — ED Notes (Signed)
Patient was at home,  Getting off toilet, had episode of right sided weakness/flacidity and garbled speech.  Patient sx lasted only 3 to 4 min.   Patient found by ems,  Alert and oriented when ems arrived.  Vss.  Patient denies pain.  She does not recall the event.  She did have a fall earlier today as well.  Patient states she has hx of falls. Patient denies any weakness at that time

## 2012-03-24 NOTE — Consult Note (Addendum)
Reason for Consult: Right-sided weakness and aphasia Referring Physician: Carleene Cooper  CC: Right-sided weakness  History is obtained from: Patient, daughter, husband  HPI: Margaret [name] is a 71 y.o. female who was helped up by her husband after having difficulty getting up from the toilet. Her daughter was helping her with her walker as she was returning, and she leaned over to the right, with her right arm press against walker and her body pinning it to the walker. When her daughter ask her something, she responded with gibberish. The patient states that she just remembers trying to stand up straight and being unable to.  This resolved after a few minutes, and she returned to normal. Baseline, she does have some difficulty walking.  ROS: A 14 point ROS was performed and is negative except as noted in the HPI.  Past Medical History  Diagnosis Date  . NICM (nonischemic cardiomyopathy)     EF 35%; cath 2/12 no CAD  . Systolic CHF, chronic   . Severe mitral regurgitation     s/p complex mitral valve repair with cox maze + LAA clipping, removal of RV lead, and implantation of CRT-D in abdomen  . Cardiac arrest - ventricular fibrillation     in 1990s  . Atrial fibrillation     amiodarone  . HTN (hypertension)   . HLD (hyperlipidemia)   . Achalasia     s/p dilation  . Recurrent UTI   . Obesity   . Arthritis   . Anoxic brain injury 1996    s/p cardiac arrest     Family History:  Mother-stroke  Social History: Tob:  None  Exam: Current vital signs: BP 114/48  Pulse 73  Temp 97.9 F (36.6 C) (Oral)  Resp 15  Ht 5' 5.5" (1.664 m)  Wt 90.719 kg (200 lb)  BMI 32.78 kg/m2  SpO2 100% Vital signs in last 24 hours: Temp:  [97.9 F (36.6 C)] 97.9 F (36.6 C) (11/30 1615) Pulse Rate:  [69-78] 73  (11/30 1730) Resp:  [11-20] 15  (11/30 1730) BP: (102-123)/(46-57) 114/48 mmHg (11/30 1730) SpO2:  [98 %-100 %] 100 % (11/30 1730) Weight:  [90.719 kg (200 lb)] 90.719 kg (200  lb) (11/30 1358)  General:  In bed, no apparent distress CV:  Regular rate and rhythm Mental Status: Patient is awake, alert, oriented to person, place. month is given as December, year as 2010 Patient is able to give a clear and coherent history. Able to spell world backwards without difficulty Able to give # of quarters in $2.75 Cranial Nerves: II: Visual Fields are full. Pupils are equal, round, and reactive to light.  Discs  are difficult to visualize. III,IV, VI: EOMI without ptosis or diploplia.  V: Facial sensation is symmetric to temperature VII: Facial movement is symmetric.  VIII: hearing is intact to voice X: Uvula elevates symmetrically XI: Shoulder shrug is symmetric. XII: tongue is midline without atrophy or fasciculations.  Motor: Tone is normal. Bulk is normal. 5/5 strength was present in all four extremities.  Sensory: Sensation is symmetric to light touch and temperature in the arms and legs. There was a stocking distribution decreased to temperature in the legs. Deep Tendon Reflexes: 2+ and symmetric in the biceps and patellae.  Cerebellar: FNF with intentional tremor bilaterally Gait: not assessed due to patient safety concerns        I have reviewed labs in epic and the results pertinent to this consultation are: BMP-elevated creatinine CBC-mild anemia  I  have reviewed the images obtained: CT-no acute changes  Impression:  71 year old female with transient right-sided weakness and aphasia. I suspect that this represents a TIA. She is already on Coumadin, and recently had a subtherapeutic period, and I suspect that this is secondary to her transient subtherapeutic status. Given that she is currently therapeutic, I would recommend admission to get an echocardiogram and perform TIA workup. With the description of a flexed arm during the episode, there may be a concern for seizure, but I feel that this is much less likely. It may be reasonable to do an EEG,  but unlikely to be done tomorrow and this could be done as an outpatient.  Recommendations: 1. HgbA1c, fasting lipid panel 2. MRI unobtainable due to PM 3. Echocardiogram 4. Carotid dopplers 5. Prophylactic therapy-Anticoagulation: Coumadin 7. Risk factor modification 8. Telemetry monitoring 9. Frequent neuro checks 10. Consider EEG  [name], MD [facility] [phone]  If 7pm- 7am, please page neurology on call at [phone].

## 2012-03-25 DIAGNOSIS — D649 Anemia, unspecified: Secondary | ICD-10-CM

## 2012-03-25 DIAGNOSIS — N179 Acute kidney failure, unspecified: Secondary | ICD-10-CM

## 2012-03-25 DIAGNOSIS — N390 Urinary tract infection, site not specified: Secondary | ICD-10-CM

## 2012-03-25 DIAGNOSIS — I4891 Unspecified atrial fibrillation: Secondary | ICD-10-CM

## 2012-03-25 DIAGNOSIS — I5023 Acute on chronic systolic (congestive) heart failure: Secondary | ICD-10-CM

## 2012-03-25 DIAGNOSIS — G459 Transient cerebral ischemic attack, unspecified: Secondary | ICD-10-CM

## 2012-03-25 DIAGNOSIS — G931 Anoxic brain damage, not elsewhere classified: Secondary | ICD-10-CM

## 2012-03-25 LAB — CBC
HCT: 30.6 % — ABNORMAL LOW (ref 36.0–46.0)
Hemoglobin: 9.4 g/dL — ABNORMAL LOW (ref 12.0–15.0)
MCHC: 30.7 g/dL (ref 30.0–36.0)
MCV: 97.1 fL (ref 78.0–100.0)
RDW: 15.1 % (ref 11.5–15.5)

## 2012-03-25 LAB — GLUCOSE, CAPILLARY: Glucose-Capillary: 95 mg/dL (ref 70–99)

## 2012-03-25 LAB — BASIC METABOLIC PANEL
BUN: 36 mg/dL — ABNORMAL HIGH (ref 6–23)
Chloride: 107 mEq/L (ref 96–112)
Creatinine, Ser: 2.18 mg/dL — ABNORMAL HIGH (ref 0.50–1.10)
GFR calc Af Amer: 25 mL/min — ABNORMAL LOW (ref >90)
GFR calc non Af Amer: 21 mL/min — ABNORMAL LOW (ref >90)
Glucose, Bld: 94 mg/dL (ref 70–99)

## 2012-03-25 LAB — IRON AND TIBC
Iron: 53 ug/dL (ref 42–135)
TIBC: 281 ug/dL (ref 250–470)
UIBC: 228 ug/dL (ref 125–400)

## 2012-03-25 LAB — PROTIME-INR: Prothrombin Time: 23.9 seconds — ABNORMAL HIGH (ref 11.6–15.2)

## 2012-03-25 LAB — LIPID PANEL
LDL Cholesterol: 54 mg/dL (ref 0–99)
Total CHOL/HDL Ratio: 2.3 RATIO
VLDL: 21 mg/dL (ref 0–40)

## 2012-03-25 LAB — HEMOGLOBIN A1C: Hgb A1c MFr Bld: 5.6 % (ref <5.7)

## 2012-03-25 LAB — RETICULOCYTES: Retic Ct Pct: 2.2 % (ref 0.4–3.1)

## 2012-03-25 MED ORDER — WARFARIN SODIUM 4 MG PO TABS
4.0000 mg | ORAL_TABLET | Freq: Once | ORAL | Status: AC
  Administered 2012-03-25: 4 mg via ORAL
  Filled 2012-03-25: qty 1

## 2012-03-25 NOTE — Progress Notes (Signed)
Margaret [name] is a 71 y.o. female who was helped up by her husband after having difficulty getting up from the toilet, had sudden onset of confusion, could not talk, lean towards her right side. Lasting 3-4 minutes, no seizure like activites noticed. She is back to her baseline.  She suffered cardiac arrest in 1996, require prolonged ICU stay of 2 weeks, she had gait difficulty, significant memory trouble since. She has afib, no coumadin, also s/p defibrillator placement.    Past Medical History   Diagnosis  Date   .  NICM (nonischemic cardiomyopathy)      EF 35%; cath 2/12 no CAD   .  Systolic CHF, chronic    .  Severe mitral regurgitation      s/p complex mitral valve repair with cox maze + LAA clipping, removal of RV lead, and implantation of CRT-D in abdomen   .  Cardiac arrest - ventricular fibrillation      in 1990s   .  Atrial fibrillation      amiodarone   .  HTN (hypertension)    .  HLD (hyperlipidemia)    .  Achalasia      s/p dilation   .  Recurrent UTI    .  Obesity    .  Arthritis    .  Anoxic brain injury  1996     s/p cardiac arrest    Family History:  Mother-stroke  Social History:  Tob: None  Exam:  Current vital signs:  97.9, HR 68-93, R 11-20, BP 92/37-126/54.  General: In bed, no apparent distress  CV: Regular rate and rhythm  Mental Status:  Awake, rely on her family to provide history Cranial Nerves:  II: Visual Fields are full. Pupils are equal, round, and reactive to light. Discs are difficult to visualize.  III,IV, VI: EOMI without ptosis or diploplia.  V: Facial sensation is symmetric to temperature  VII: Facial movement is symmetric.  VIII: hearing is intact to voice  X: Uvula elevates symmetrically  XI: Shoulder shrug is symmetric.  XII: tongue is midline without atrophy or fasciculations.  Motor:  Tone is normal. Bulk is normal. 5/5 strength was present in all four extremities.  Sensory:  Sensation is symmetric to light touch    Deep  Tendon Reflexes:  Hypoactive and symmetric Cerebellar:  FTN, HTS with intentional tremor bilaterally  Gait:  not assessed due to patient safety concerns   I have reviewed the film with her family.     Impression: 71 year old female with transient right-sided weakness and aphasia. With moderate left temporal area arachnoid cyst. Extensive periventricular small vessel disease. TIA vs seizure.  Recommendations:  1. Complete evaluation with US carotid, ECHO. 2, keep Coumadin,  3. PT/OT. 4. Follow up with [facility] one month post discharge.

## 2012-03-25 NOTE — Progress Notes (Signed)
VASCULAR LAB PRELIMINARY  PRELIMINARY  PRELIMINARY  PRELIMINARY  Carotid duplex completed.    Preliminary report:  Bilateral:  No evidence of hemodynamically significant internal carotid artery stenosis.   Vertebral artery flow is antegrade.     Kirstyn Lean, RVS 03/25/2012, 11:23 AM

## 2012-03-25 NOTE — Progress Notes (Signed)
[facility] PROGRESS NOTE  Margaret [name] ZOX:[id number] DOB: [birth year] DOA: 03/24/2012 PCP: Allean Found, MD  Assessment/Plan: Principal Problem:  *Right sided weakness- TIA vs seizure - continue coumadin, INR therapeutic -f/u echo, carotid neg -await PT OT eval -appreciate neuro input Active Problems:  DYSLIPIDEMIA  Acute on chronic systolic heart failure -compensated, monitor fluid status and resume lasix when appropriate  IMPLANTATION OF DEFIBRILLATOR, HX OF Cardiac arrest-aborted Atrial fibrillation - rate controlled, continue amio and metoprolol   Obesity  Acute renal failure/CKD (chronic kidney disease), stage III -improved with gentle hydration  HTN (hypertension) -continue current meds  HLD (hyperlipidemia)  Recurrent UTI -continue current abx pending urine culture  Anemia   Code Status: partial code - wants CPR but no vent Family Communication: husband at bedside  Disposition Plan: to home when medically ready   Consultants:  neuro  Procedures: Carotid duplex completed.  Preliminary report: Bilateral: No evidence of hemodynamically significant internal carotid artery stenosis. Vertebral artery flow is antegrade.  Echo pending  Antibiotics:  none  HPI/Subjective: Denies any new [name]/o, no further right sided weakness or aphasia. Speech fluent today  Objective: Filed Vitals:   03/25/12 0100 03/25/12 0253 03/25/12 0500 03/25/12 0700  BP: 101/41 92/37 95/40  117/48  Pulse: 68 69 93 68  Temp: 98 F (36.7 [name]) 98.3 F (36.8 [name]) 97.9 F (36.6 [name]) 98 F (36.7 [name])  TempSrc:      Resp: 18 18 18 18   Height:      Weight:      SpO2: 100% 98% 98% 98%    Intake/Output Summary (Last 24 hours) at 03/25/12 0939 Last data filed at 03/25/12 0737  Gross per 24 hour  Intake    240 ml  Output      0 ml  Net    240 ml   Filed Weights   03/24/12 1358  Weight: 90.719 kg (200 lb)    Exam:   General: sitting up in chair, in NAD, A&OX3     Cardiovascular: RRR  Respiratory: CTAB  Abdomen: soft+BS,NT/ND  Neuro :nl strength, symmetric  Data Reviewed: Basic Metabolic Panel:  Lab 03/25/12 8119 03/24/12 1549  NA 144 138  K 3.5 4.1  CL 107 100  CO2 28 28  GLUCOSE 94 116*  BUN 36* 43*  CREATININE 2.18* 2.47*  CALCIUM 8.7 9.4  MG -- --  PHOS -- --   Liver Function Tests:  Lab 03/24/12 1549  AST 26  ALT 19  ALKPHOS 79  BILITOT 0.8  PROT 7.4  ALBUMIN 3.5   No results found for this basename: LIPASE:5,AMYLASE:5 in the last 168 hours No results found for this basename: AMMONIA:5 in the last 168 hours CBC:  Lab 03/25/12 0550 03/24/12 1549  WBC 4.0 6.4  NEUTROABS -- 5.2  HGB 9.4* 10.1*  HCT 30.6* 32.5*  MCV 97.1 96.2  PLT 144* 178   Cardiac Enzymes:  Lab 03/24/12 1549  CKTOTAL --  CKMB --  CKMBINDEX --  TROPONINI <0.30   BNP (last 3 results) No results found for this basename: PROBNP:3 in the last 8760 hours CBG:  Lab 03/24/12 1655  GLUCAP 106*    No results found for this or any previous visit (from the past 240 hour(s)).   Studies: Ct Head Wo Contrast  03/24/2012  *RADIOLOGY REPORT*  Clinical Data: Right-sided weakness and slurred speech.  Associated headache.  CT HEAD WITHOUT CONTRAST  Technique:  Contiguous axial images were obtained from the base of the skull through the  vertex without contrast.  Comparison: None.  Findings: Fluid of CSF density anterior to the tip of the left temporal lobe appears to represent an arachnoid cyst.  Moderately severe small vessel ischemic changes are present in the periventricular white matter.  Moderate cortical atrophy is present.  There is no evidence of acute hemorrhage, extra-axial fluid collection, mass effect, hydrocephalus or acute infarction. No evidence of mass lesion.  The skull is normal in appearance.  IMPRESSION: Moderately severe small vessel disease.  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    Original Report Authenticated By: Irish Lack, M.D.     Scheduled Meds:   . [COMPLETED] sodium chloride   Intravenous Once  . amiodarone  200 mg Oral BID  . calcium carbonate  2 tablet Oral Daily  . cholecalciferol  400 Units Oral Daily  . ciprofloxacin  250 mg Oral BID  . fesoterodine  4 mg Oral Daily  . fluticasone  1 spray Each Nare Daily  . folic acid  1 mg Oral Daily  . loratadine  10 mg Oral Daily  . metoprolol tartrate  12.5 mg Oral BID  . multivitamin with minerals  1 tablet Oral Daily  . [COMPLETED] ondansetron (ZOFRAN) IV  4 mg Intravenous Once  . simvastatin  20 mg Oral q1800  . [COMPLETED] warfarin  4 mg Oral NOW  . Warfarin - Pharmacist Dosing Inpatient   Does not apply q1800  . [DISCONTINUED] Vitamin D3  400 Units Oral Daily   Continuous Infusions:   . [EXPIRED] sodium chloride 50 mL/hr at 03/25/12 0443    Principal Problem:  *Right sided weakness Active Problems:  DYSLIPIDEMIA  Acute on chronic systolic heart failure  IMPLANTATION OF DEFIBRILLATOR, HX OF  Cardiac arrest-aborted  Atrial fibrillation  Obesity  Acute renal failure  CKD (chronic kidney disease), stage III  HTN (hypertension)  HLD (hyperlipidemia)  Recurrent UTI  Anemia    Time spent:    Margaret [name]  [facility] Pager [phone] If 8PM-8AM, please contact night-coverage at [email or web address], password [facility] 03/25/2012, 9:39 AM  LOS: 1 day

## 2012-03-25 NOTE — Progress Notes (Signed)
ANTICOAGULATION CONSULT NOTE - Follow-up  Pharmacy Consult for coumadin Indication: atrial fibrillation  No Known Allergies  Patient Measurements: Height: 5' 5.5" (166.4 cm) Weight: 200 lb (90.719 kg) IBW/kg (Calculated) : 58.15  Heparin Dosing Weight:   Vital Signs: Temp: 98.2 F (36.8 C) (12/01 0938) Temp src: Oral (12/01 0938) BP: 119/48 mmHg (12/01 0938) Pulse Rate: 69  (12/01 0938)  Labs:  Basename 03/25/12 0550 03/24/12 1549  HGB 9.4* 10.1*  HCT 30.6* 32.5*  PLT 144* 178  APTT -- 34  LABPROT 23.9* 23.6*  INR 2.25* 2.21*  HEPARINUNFRC -- --  CREATININE 2.18* 2.47*  CKTOTAL -- --  CKMB -- --  TROPONINI -- <0.30    Estimated Creatinine Clearance: 26.2 ml/min (by C-G formula based on Cr of 2.18).  Assessment: 71 yo female with afib will be continued on coumadin therapy.  Home coumadin dose was 4mg  po qday.  INR today is therapeutic at 2.25. She continues on cipro which can increase the INR. H/H 9.4/30.6, plts 144. No bleeding noted.  Goal of Therapy:  INR 2-3   Plan:  1) Repeat Coumadin 4mg  po x1 2) Daily PT/INR  Omaree Fuqua, Drake Leach 03/25/2012,10:17 AM

## 2012-03-25 NOTE — Evaluation (Signed)
Physical Therapy Evaluation Patient Details Name: Margaret [name] MRN: [id number] DOB: 1941/02/12 Today's Date: 03/25/2012 Time: 4098-1191 PT Time Calculation (min): 20 min  PT Assessment / Plan / Recommendation Clinical Impression  Pt admitted with aphasia and R sided weakness, symptoms have resolved. Pt is near her baseline functional level, although limited secondary to fatigue. Pt will benefit from skilled PT in the acute care setting in order to maximize functional mobility and safety prior to d/c home.     PT Assessment  Patient needs continued PT services    Follow Up Recommendations  No PT follow up;Supervision for mobility/OOB    Does the patient have the potential to tolerate intense rehabilitation      Barriers to Discharge        Equipment Recommendations       Recommendations for Other Services     Frequency Min 4X/week    Precautions / Restrictions Precautions Precautions: Fall Restrictions Weight Bearing Restrictions: No   Pertinent Vitals/Pain No complaints of pain      Mobility  Bed Mobility Bed Mobility: Sitting - Scoot to Edge of Bed;Supine to Sit Supine to Sit: 6: Modified independent (Device/Increase time) Sitting - Scoot to Edge of Bed: 6: Modified independent (Device/Increase time) Transfers Transfers: Sit to Stand;Stand to Sit Sit to Stand: 4: Min assist;With upper extremity assist;From bed;From toilet Stand to Sit: 4: Min assist;With upper extremity assist;To toilet;To chair/3-in-1 Details for Transfer Assistance: Min assist for anterior translation into standing. Assist for stability and controlled descent. Cues for proper hand placement Ambulation/Gait Ambulation/Gait Assistance: 4: Min assist Ambulation Distance (Feet): 25 Feet Assistive device: Rolling walker Ambulation/Gait Assistance Details: Pt ambulated to/from bathroom. After finishing hygiene at sink, pt fatigued and did not want to walk farther. Cues for safe technique with RW as  well as upright positioning.  Gait Pattern: Step-to pattern;Narrow base of support;Decreased hip/knee flexion - right;Decreased hip/knee flexion - left Gait velocity: slow gait speed Stairs: No Modified Rankin (Stroke Patients Only) Pre-Morbid Rankin Score: Moderate disability Modified Rankin: Moderately severe disability    Shoulder Instructions     Exercises     PT Diagnosis: Difficulty walking  PT Problem List: Decreased strength;Decreased activity tolerance;Decreased balance;Decreased mobility;Decreased safety awareness;Decreased knowledge of use of DME;Decreased knowledge of precautions PT Treatment Interventions: Gait training;DME instruction;Stair training;Functional mobility training;Therapeutic activities;Patient/family education   PT Goals Acute Rehab PT Goals PT Goal Formulation: With patient Time For Goal Achievement: 04/01/12 Potential to Achieve Goals: Good Pt will go Sit to Stand: with modified independence PT Goal: Sit to Stand - Progress: Goal set today Pt will go Stand to Sit: with modified independence PT Goal: Stand to Sit - Progress: Goal set today Pt will Transfer Bed to Chair/Chair to Bed: with modified independence PT Transfer Goal: Bed to Chair/Chair to Bed - Progress: Goal set today Pt will Ambulate: >150 feet;with modified independence;with least restrictive assistive device PT Goal: Ambulate - Progress: Goal set today Pt will Go Up / Down Stairs: 3-5 stairs;with modified independence;with rail(s) PT Goal: Up/Down Stairs - Progress: Goal set today  Visit Information  Last PT Received On: 03/25/12 Assistance Needed: +1 PT/OT Co-Evaluation/Treatment: Yes    Subjective Data  Patient Stated Goal: to be back to normal   Prior Functioning  Home Living Lives With: Spouse Available Help at Discharge: Family;Available 24 hours/day Type of Home: House Home Access: Stairs to enter [employer] of Steps: 4 Entrance Stairs-Rails: Can reach  both Home Layout: One level Bathroom Shower/Tub: Tub/shower unit;Curtain (sponge  bathes) Bathroom Toilet: Standard Bathroom Accessibility: No Home Adaptive Equipment: Walker - rolling;Wheelchair - Architectural technologist without back Prior Function Level of Independence: Independent with assistive device(s) Able to Take Stairs?: Yes Driving: No Vocation: Retired Comments: uses RW in house and [employer] out of house Communication Communication: No difficulties Dominant Hand: Right    Cognition  Overall Cognitive Status: Appears within functional limits for tasks assessed/performed Arousal/Alertness: Awake/alert Orientation Level: Appears intact for tasks assessed Behavior During Session: [facility] for tasks performed    Extremity/Trunk Assessment Right Upper Extremity Assessment RUE ROM/Strength/Tone: [facility] for tasks assessed Left Upper Extremity Assessment LUE ROM/Strength/Tone: WFL for tasks assessed Right Lower Extremity Assessment RLE ROM/Strength/Tone: Within functional levels RLE Sensation: WFL - Light Touch RLE Coordination: WFL - gross/fine motor Left Lower Extremity Assessment LLE ROM/Strength/Tone: Within functional levels LLE Sensation: WFL - Light Touch LLE Coordination: WFL - gross/fine motor   Balance Balance Balance Assessed: Yes Dynamic Standing Balance Dynamic Standing - Balance Support: Left upper extremity supported;During functional activity Dynamic Standing - Level of Assistance: 5: Stand by assistance Dynamic Standing - Balance Activities: Reaching for objects;Reaching across midline Dynamic Standing - Comments: Pt standing at sink while performing hygiene with SBA only. Hand on walker or sink  End of Session PT - End of Session Equipment Utilized During Treatment: Gait belt Activity Tolerance: Patient limited by fatigue Patient left: in chair;with call bell/phone within reach Nurse Communication: Mobility status  GP Functional Assessment Tool Used: clinical  judgement Functional Limitation: Mobility: Walking and moving around Mobility: Walking and Moving Around Current Status ([id number]): At least 20 percent but less than 40 percent impaired, limited or restricted Mobility: Walking and Moving Around Goal Status [phone]): 0 percent impaired, limited or restricted   Milana Kidney 03/25/2012, 10:16 AM  03/25/2012 Milana Kidney DPT PAGER: [phone] OFFICE: [phone]

## 2012-03-25 NOTE — Evaluation (Signed)
Occupational Therapy Evaluation Patient Details Name: Margaret [name] MRN: [id number] DOB: 1940/06/09 Today's Date: 03/25/2012 Time: 4098-1191 OT Time Calculation (min): 21 min  OT Assessment / Plan / Recommendation Clinical Impression  Pt admitted with right sided weakness. Possible TIA.  history of nonischemic cardiomyopathy and chronic systolic congestive heart failure with an ejection fraction of 35%-40%. Pt is near baseline but would benefit from at least one more session to ensure safety and independence with ADLs/functional mobility before return home with husband.    OT Assessment  Patient needs continued OT Services    Follow Up Recommendations  No OT follow up    Barriers to Discharge      Equipment Recommendations  None recommended by OT    Recommendations for Other Services    Frequency  Min 2X/week    Precautions / Restrictions Precautions Precautions: Fall Restrictions Weight Bearing Restrictions: No   Pertinent Vitals/Pain See vitals    ADL  Grooming: Performed;Wash/dry hands;Wash/dry face;Min guard Where Assessed - Grooming: Unsupported standing Toilet Transfer: Performed;Minimal assistance Statistician Method: Sit to Barista: Comfort height toilet;Grab bars Toileting - Clothing Manipulation and Hygiene: Performed;Min guard Where Assessed - Engineer, mining and Hygiene: Sit to stand from 3-in-1 or toilet Equipment Used: Gait belt;Rolling walker Transfers/Ambulation Related to ADLs: min guard with RW ADL Comments: Pt assist for sit<>stand and min guard for balance.  Likely that pt is weak from being in bed for past day.     OT Diagnosis: Generalized weakness  OT Problem List: Decreased strength;Decreased activity tolerance;Impaired balance (sitting and/or standing);Decreased knowledge of use of DME or AE OT Treatment Interventions: Self-care/ADL training;DME and/or AE instruction;Therapeutic activities;Patient/family  education;Balance training   OT Goals Acute Rehab OT Goals OT Goal Formulation: With patient Time For Goal Achievement: 04/01/12 Potential to Achieve Goals: Good ADL Goals Pt Will Perform Grooming: with supervision;Standing at sink ADL Goal: Grooming - Progress: Goal set today Pt Will Transfer to Toilet: with supervision;Ambulation;with DME;Regular height toilet ADL Goal: Toilet Transfer - Progress: Goal set today Pt Will Perform Toileting - Clothing Manipulation: with supervision;Standing ADL Goal: Toileting - Clothing Manipulation - Progress: Goal set today Pt Will Perform Toileting - Hygiene: with supervision;Sit to stand from 3-in-1/toilet ADL Goal: Toileting - Hygiene - Progress: Goal set today Miscellaneous OT Goals Miscellaneous OT Goal #1: Pt will perform sit<>stand with no LOB as precursor for toilet transfer. OT Goal: Miscellaneous Goal #1 - Progress: Goal set today  Visit Information  Last OT Received On: 03/25/12 Assistance Needed: +1    Subjective Data      Prior Functioning     Home Living Lives With: Spouse Available Help at Discharge: Family;Available 24 hours/day Type of Home: House Home Access: Stairs to enter [employer] of Steps: 4 Entrance Stairs-Rails: Can reach both Home Layout: One level Bathroom Shower/Tub: Tub/shower unit;Curtain (sponge bathes) Bathroom Toilet: Standard Bathroom Accessibility: No Home Adaptive Equipment: Walker - rolling;Wheelchair - Architectural technologist without back Prior Function Level of Independence: Independent with assistive device(s) Able to Take Stairs?: Yes Driving: No Vocation: Retired Comments: uses RW in house and [employer] out of house Communication Communication: No difficulties Dominant Hand: Right         Vision/Perception     Cognition  Overall Cognitive Status: Appears within functional limits for tasks assessed/performed Arousal/Alertness: Awake/alert Orientation Level: Appears intact for  tasks assessed Behavior During Session: [facility] for tasks performed    Extremity/Trunk Assessment Right Upper Extremity Assessment RUE ROM/Strength/Tone: [facility] for tasks assessed  Left Upper Extremity Assessment LUE ROM/Strength/Tone: WFL for tasks assessed Right Lower Extremity Assessment RLE ROM/Strength/Tone: Within functional levels RLE Sensation: WFL - Light Touch RLE Coordination: WFL - gross/fine motor Left Lower Extremity Assessment LLE ROM/Strength/Tone: Within functional levels LLE Sensation: WFL - Light Touch LLE Coordination: WFL - gross/fine motor     Mobility Bed Mobility Bed Mobility: Sitting - Scoot to Edge of Bed;Supine to Sit Supine to Sit: 6: Modified independent (Device/Increase time) Sitting - Scoot to Edge of Bed: 6: Modified independent (Device/Increase time) Transfers Sit to Stand: 4: Min assist;With upper extremity assist;From bed;From toilet Stand to Sit: 4: Min assist;With upper extremity assist;To toilet;To chair/3-in-1 Details for Transfer Assistance: Min assist for anterior translation into standing. Assist for stability and controlled descent. Cues for proper hand placement     Shoulder Instructions     Exercise     Balance Balance Balance Assessed: Yes Dynamic Standing Balance Dynamic Standing - Balance Support: Left upper extremity supported;During functional activity Dynamic Standing - Level of Assistance: 5: Stand by assistance Dynamic Standing - Balance Activities: Reaching for objects;Reaching across midline Dynamic Standing - Comments: Pt standing at sink while performing hygiene with SBA only. Hand on walker or sink   End of Session OT - End of Session Equipment Utilized During Treatment: Gait belt Activity Tolerance: Patient tolerated treatment well Patient left: in chair;with call bell/phone within reach;with nursing in room Nurse Communication: Mobility status  GO Functional Assessment Tool Used: clincical judgement Functional  Limitation: Self care Self Care Current Status (Z6109): 0 percent impaired, limited or restricted Self Care Goal Status (U0454): At least 1 percent but less than 20 percent impaired, limited or restricted  03/25/2012 Margaret [name] OTR/L Pager [phone] Office [phone]  Margaret [name] 03/25/2012, 10:22 AM

## 2012-03-25 NOTE — Progress Notes (Signed)
  Echocardiogram 2D Echocardiogram has been performed.  Arnette Driggs FRANCES 03/25/2012, 4:07 PM

## 2012-03-25 NOTE — ED Provider Notes (Signed)
Medical screening examination/treatment/procedure(s) were conducted as a shared visit with non-physician practitioner(s) and myself.  I personally evaluated the patient during the encounter 3:56 PM  Pt is 71 yo woman with Hx heart surgery, AF, on coumadin, who had a 5 minute episode of inability to speak and right sided weakness which has resolved, occurring appx 2 hours ago. Exam now shows her awake and alert, speech fluent, no sensory or motor deficit. Lab workup for TIA ordered. Consult with Neurology requested.  6:17 PM  Lab workup did not show a stroke. Pt seen by neurology --> pt has too many comorbidities to go to the TIA CDU protocol. Call to [facility] to admit her.      Carleene Cooper III, MD 03/25/12 [phone]

## 2012-03-26 ENCOUNTER — Observation Stay (HOSPITAL_COMMUNITY): Payer: Medicare Other

## 2012-03-26 DIAGNOSIS — Z9889 Other specified postprocedural states: Secondary | ICD-10-CM

## 2012-03-26 DIAGNOSIS — M6281 Muscle weakness (generalized): Secondary | ICD-10-CM

## 2012-03-26 LAB — URINE CULTURE: Colony Count: >=100000

## 2012-03-26 LAB — FERRITIN: Ferritin: 128 ng/mL (ref 10–291)

## 2012-03-26 LAB — PROTIME-INR
INR: 2.54 — ABNORMAL HIGH (ref 0.00–1.49)
Prothrombin Time: 26.1 seconds — ABNORMAL HIGH (ref 11.6–15.2)

## 2012-03-26 MED ORDER — CIPROFLOXACIN HCL 250 MG PO TABS: 250.0000 mg | ORAL_TABLET | Freq: Two times a day (BID) | ORAL | Status: DC

## 2012-03-26 MED ORDER — WARFARIN SODIUM 4 MG PO TABS
4.0000 mg | ORAL_TABLET | Freq: Once | ORAL | Status: DC
  Filled 2012-03-26: qty 1

## 2012-03-26 MED ORDER — FUROSEMIDE 80 MG PO TABS: 40.0000 mg | ORAL_TABLET | Freq: Every day | ORAL | Status: DC

## 2012-03-26 NOTE — Progress Notes (Signed)
ANTICOAGULATION CONSULT NOTE - Follow-up  Pharmacy Consult for coumadin Indication: atrial fibrillation  No Known Allergies  Patient Measurements: Height: 5' 5.5" (166.4 cm) Weight: 200 lb (90.719 kg) IBW/kg (Calculated) : 58.15  Heparin Dosing Weight:   Vital Signs: Temp: 98.3 F (36.8 C) (12/02 0924) Temp src: Oral (12/02 0924) BP: 109/53 mmHg (12/02 0924) Pulse Rate: 73  (12/02 0924)  Labs:  Basename 03/26/12 0650 03/25/12 0550 03/24/12 1549  HGB -- 9.4* 10.1*  HCT -- 30.6* 32.5*  PLT -- 144* 178  APTT -- -- 34  LABPROT 26.1* 23.9* 23.6*  INR 2.54* 2.25* 2.21*  HEPARINUNFRC -- -- --  CREATININE -- 2.18* 2.47*  CKTOTAL -- -- --  CKMB -- -- --  TROPONINI -- -- <0.30    Estimated Creatinine Clearance: 26.2 ml/min (by C-G formula based on Cr of 2.18).  Assessment: 71 yo female with afib will be continued on coumadin therapy.  Home coumadin dose was 4mg  po qday.  INR today is therapeutic. She completes a course of cipro today which can increase the INR. No bleeding noted.  Goal of Therapy:  INR 2-3   Plan:  1) Repeat Coumadin 4mg  po x1 2) Daily PT/INR  Madolyn Frieze 03/26/2012,9:55 AM

## 2012-03-26 NOTE — Progress Notes (Signed)
Patient discharge instructions, follow up appointments and stroke education were provided to patient and family. Stroke Education Manual given and discussed. All questions answered to patient and family's satisfaction. Pt D/C home in no signs of acute distress.

## 2012-03-26 NOTE — Progress Notes (Signed)
EEG completed.

## 2012-03-26 NOTE — Progress Notes (Signed)
Occupational Therapy Treatment Patient Details Name: Margaret [name] MRN: [id number] DOB: Nov 07, 1940 Today's Date: 03/26/2012 Time: 4098-1191 OT Time Calculation (min): 31 min  OT Assessment / Plan / Recommendation Comments on Treatment Session Pt is performing at min guard assist level in mobility, toileting and standing grooming activities.  Family would like pt to have assist of a personal care attendant twice a week on an ongoing basis.  Communicated this to care manager.      Follow Up Recommendations  No OT follow up    Barriers to Discharge       Equipment Recommendations  None recommended by OT    Recommendations for Other Services    Frequency Min 2X/week   Plan Discharge plan remains appropriate    Precautions / Restrictions Precautions Precautions: Fall   Pertinent Vitals/Pain No pain.    ADL  Grooming: Min guard;Wash/dry hands Where Assessed - Grooming: Unsupported standing Toilet Transfer: Hydrographic surveyor Method: Sit to Barista: Comfort height toilet;Grab bars Toileting - Architect and Hygiene: Min guard Where Assessed - Engineer, mining and Hygiene: Sit to stand from 3-in-1 or toilet Equipment Used: Gait belt;Rolling walker Transfers/Ambulation Related to ADLs: min guard with RW ADL Comments: Min guard and extra time for sit to stand.  No LOB.     OT Diagnosis:    OT Problem List:   OT Treatment Interventions:     OT Goals Acute Rehab OT Goals OT Goal Formulation: With patient Time For Goal Achievement: 04/01/12 Potential to Achieve Goals: Good ADL Goals Pt Will Perform Grooming: with supervision;Standing at sink ADL Goal: Grooming - Progress: Progressing toward goals Pt Will Transfer to Toilet: with supervision;Ambulation;with DME;Regular height toilet ADL Goal: Toilet Transfer - Progress: Progressing toward goals Pt Will Perform Toileting - Clothing Manipulation: with  supervision;Standing ADL Goal: Toileting - Clothing Manipulation - Progress: Met Pt Will Perform Toileting - Hygiene: with supervision;Sit to stand from 3-in-1/toilet ADL Goal: Toileting - Hygiene - Progress: Met Miscellaneous OT Goals Miscellaneous OT Goal #1: Pt will perform sit<>stand with no LOB as precursor for toilet transfer. OT Goal: Miscellaneous Goal #1 - Progress: Met  Visit Information  Last OT Received On: 03/26/12 Assistance Needed: +1    Subjective Data      Prior Functioning       Cognition  Overall Cognitive Status: Appears within functional limits for tasks assessed/performed Arousal/Alertness: Awake/alert Orientation Level: Appears intact for tasks assessed Behavior During Session: Anxious    Mobility  Shoulder Instructions Bed Mobility Bed Mobility: Not assessed Transfers Sit to Stand: 4: Min guard;With upper extremity assist;From chair/3-in-1;From toilet Stand to Sit: 4: Min guard;With upper extremity assist;With armrests;To chair/3-in-1;To toilet Details for Transfer Assistance: Pt needed cues to keep her walker with her.  Tended to release the walker for the grab bars or arms of the chair.       Exercises      Balance     End of Session OT - End of Session Activity Tolerance: Patient tolerated treatment well Patient left: in chair;with call bell/phone within reach;with family/visitor present Nurse Communication: Other (comment) (daughter wants on going personal care services)  GO     [name] 03/26/2012, 2:51 PM [phone]

## 2012-03-26 NOTE — Progress Notes (Signed)
   CARE MANAGEMENT NOTE 03/26/2012  Patient:  Margaret [name], Margaret [name]   Account Number:  [id number]  Date Initiated:  03/26/2012  Documentation initiated by:  [facility]  Subjective/Objective Assessment:   weakness, syncopal episode     Action/Plan:   lives at home with husband   Anticipated DC Date:  03/26/2012   Anticipated DC Plan:  HOME/SELF CARE      DC Planning Services  CM consult      Choice offered to / List presented to:             Status of service:  Completed, signed off Medicare Important Message given?   (If response is "NO", the following Medicare IM given date fields will be blank) Date Medicare IM given:   Date Additional Medicare IM given:    Discharge Disposition:  HOME/SELF CARE  Per UR Regulation:  Reviewed for med. necessity/level of care/duration of stay  If discussed at Long Length of Stay Meetings, dates discussed:    Comments:  03/26/2012 1600 NCM spoke to dtr. States they are interested in private duty aide for their parents. Provided dtr with list of private duty in this area. PT/OT did not recommend any follow up post dc. Dtr states no DME is needed at this time. Isidoro Donning RN CCM Case Mgmt phone [phone]

## 2012-03-26 NOTE — Progress Notes (Signed)
Utilization review complete 

## 2012-03-26 NOTE — Progress Notes (Signed)
Stroke Team Progress Note  HISTORY Margaret [name] is a 71 y.o. female who was helped up by her husband after having difficulty getting up from the toilet. Her daughter was helping her with her walker as she was returning, and she leaned over to the right, with her right arm press against walker and her body pinning it to the walker. When her daughter ask her something, she responded with gibberish. The patient states that she just remembers trying to stand up straight and being unable to. This resolved after a few minutes, and she returned to normal. Baseline, she does have some difficulty walking. She was last seen normal 111/30/2013 at 1300. Patient was not a TPA candidate secondary to with symptoms resolved. She was admitted for further evaluation and treatment.  She suffered cardiac arrest in 1996, require prolonged ICU stay of 2 weeks, she had gait difficulty, significant memory trouble since. She has afib, no coumadin, also s/p defibrillator placement.  SUBJECTIVE Her daughter and grandson are at the bedside.  Overall she feels her condition is completely resolved.   OBJECTIVE Most recent Vital Signs: Filed Vitals:   03/25/12 2202 03/26/12 0309 03/26/12 0631 03/26/12 0924  BP: 114/56 105/46 120/46 109/53  Pulse: 72 70 69 73  Temp:  97.9 F (36.6 C) 97.8 F (36.6 C) 98.3 F (36.8 C)  TempSrc:  Oral Oral Oral  Resp: 18 16 18 18   Height:      Weight:      SpO2:  97% 97% 97%   CBG (last 3)   Basename 03/25/12 1141 03/24/12 1655  GLUCAP 95 106*   IV Fluid Intake:     MEDICATIONS    . amiodarone  200 mg Oral BID  . calcium carbonate  2 tablet Oral Daily  . cholecalciferol  400 Units Oral Daily  . ciprofloxacin  250 mg Oral BID  . fesoterodine  4 mg Oral Daily  . fluticasone  1 spray Each Nare Daily  . folic acid  1 mg Oral Daily  . loratadine  10 mg Oral Daily  . metoprolol tartrate  12.5 mg Oral BID  . multivitamin with minerals  1 tablet Oral Daily  . simvastatin  20 mg  Oral q1800  . [COMPLETED] warfarin  4 mg Oral ONCE-1800  . warfarin  4 mg Oral ONCE-1800  . Warfarin - Pharmacist Dosing Inpatient   Does not apply q1800   PRN:  acetaminophen  Diet:  Cardiac thin liquids Activity:    Up as tolerated DVT Prophylaxis:  warfarin  CLINICALLY SIGNIFICANT STUDIES Basic Metabolic Panel:  Lab 03/25/12 7829 03/24/12 1549  NA 144 138  K 3.5 4.1  CL 107 100  CO2 28 28  GLUCOSE 94 116*  BUN 36* 43*  CREATININE 2.18* 2.47*  CALCIUM 8.7 9.4  MG -- --  PHOS -- --   Liver Function Tests:  Lab 03/24/12 1549  AST 26  ALT 19  ALKPHOS 79  BILITOT 0.8  PROT 7.4  ALBUMIN 3.5   CBC:  Lab 03/25/12 0550 03/24/12 1549  WBC 4.0 6.4  NEUTROABS -- 5.2  HGB 9.4* 10.1*  HCT 30.6* 32.5*  MCV 97.1 96.2  PLT 144* 178   Coagulation:  Lab 03/26/12 0650 03/25/12 0550 03/24/12 1549  LABPROT 26.1* 23.9* 23.6*  INR 2.54* 2.25* 2.21*   Cardiac Enzymes:  Lab 03/24/12 1549  CKTOTAL --  CKMB --  CKMBINDEX --  TROPONINI <0.30   Urinalysis:  Lab 03/24/12 1640  COLORURINE YELLOW  LABSPEC 1.013  PHURINE 5.0  GLUCOSEU NEGATIVE  HGBUR SMALL*  BILIRUBINUR NEGATIVE  KETONESUR NEGATIVE  PROTEINUR NEGATIVE  UROBILINOGEN 0.2  NITRITE POSITIVE*  LEUKOCYTESUR NEGATIVE   Lipid Panel    Component Value Date/Time   CHOL 135 03/25/2012 0550   TRIG 104 03/25/2012 0550   HDL 60 03/25/2012 0550   CHOLHDL 2.3 03/25/2012 0550   VLDL 21 03/25/2012 0550   LDLCALC 54 03/25/2012 0550   HgbA1C  Lab Results  Component Value Date   HGBA1C 5.6 03/25/2012    Urine Drug Screen:     Component Value Date/Time   LABOPIA NONE DETECTED 03/24/2012 2310   COCAINSCRNUR NONE DETECTED 03/24/2012 2310   LABBENZ NONE DETECTED 03/24/2012 2310   AMPHETMU NONE DETECTED 03/24/2012 2310   THCU NONE DETECTED 03/24/2012 2310   LABBARB NONE DETECTED 03/24/2012 2310    Alcohol Level: No results found for this basename: ETH:2 in the last 168 hours  CT of the brain  03/24/2012 Moderately  severe small vessel disease.  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    MRI of the brain  Pacemaker/defib  MRA of the brain  Pacemaker/defib  2D Echocardiogram    Carotid Doppler  No evidence of hemodynamically significant internal carotid artery stenosis. Vertebral artery flow is antegrade.   CXR    EKG     Therapy Recommendations PT - none; OT - none  Physical Exam   General: In bed, no apparent distress  CV: Regular rate and rhythm  Mental Status:  Awake, rely on her family to provide history  Cranial Nerves:  II: Visual Fields are full. Pupils are equal, round, and reactive to light. Discs are difficult to visualize.  III,IV, VI: EOMI without ptosis or diploplia.  V: Facial sensation is symmetric to temperature  VII: Facial movement is symmetric.  VIII: hearing is intact to voice  X: Uvula elevates symmetrically  XI: Shoulder shrug is symmetric.  XII: tongue is midline without atrophy or fasciculations.  Motor:  Tone is normal. Bulk is normal. 5/5 strength was present in all four extremities.  Sensory:  Sensation is symmetric to light touch  Deep Tendon Reflexes:  Hypoactive and symmetric  Cerebellar:  FTN, HTS with intentional tremor bilaterally  Gait:  not assessed due to patient safety concerns    ASSESSMENT [name] is a 71 y.o. female presenting with transient right-sided hemiparesis and aphasia. Unable to get MRI due to pacer/defib. Dx: TIA vs seizure, difficult to tell based on presenting/resolved symptoms. Recommend EEG to complete work up. Most likely TIA. On warfarin prior to admission for atrial fibrillation, INR 2.25 on admission. Now on warfarin for secondary stroke prevention. Patient with no resultant neuro deficits.   NICM EF 35%, cath 2/12 no CAD  Chronic systolic CHF  Severe MR, s/p complex MVR  Cardiac arrest w/ v fib 1990s  Anoxic brain injury s/p cardiac arrest 196 atrial  fibrillation Hypertension Hyperlipidemia, LDL 54, on statin PTA, at goal LDL < 100 Obesity, Body mass index is 32.78 kg/(m^2).  Hospital day # 2  TREATMENT/PLAN  Continue warfarin for secondary stroke prevention.  EEG. Can be arranged as an OP if patient ready for discharge before it can be done.  Annie Main, MSN, RN, ANVP-BC, ANP-BC, [facility] Pager: [phone] 03/26/2012 10:12 AM   I have personally examined this patient,reviewed pertinent data and developed plan of care. I agree with above Delia Heady, MD  Medical Director [facility] Pager: [phone] 03/26/2012 7:48 PM

## 2012-03-26 NOTE — Discharge Summary (Signed)
Physician Discharge Summary  Margaret [name] WUJ:[id number] DOB: 1940/06/28 DOA: 03/24/2012  PCP: Allean Found, MD  Admit date: 03/24/2012 Discharge date: 03/26/2012  Time spent: >66minutes  Recommendations for Outpatient Follow-up:      Follow-up Information    Follow up with Allean Found, MD. (in 1-2weeks, call for appt)    Contact information:   [address] [place] [address] [phone]       Follow up with Gates Rigg, MD. (in 2-3weeks, call for appt upon discharge)    Contact information:   [address], [address] [facility] [place] [address] [phone]        Followup at Dr. York Spaniel office on Wednesday 12/4 for PT/INR Pending studies Echo pending at the time of discharge-Dr. Donnie Aho states is okay for patient to be discharged prior to to him reading echo and he will read it and follow up with patient. EEG done prior to discharge, result pending and patient to followup with Dr. Pearlean Brownie for results  Discharge Diagnoses:  Principal Problem:  *Right sided weakness Active Problems:  DYSLIPIDEMIA  Acute on chronic systolic heart failure  IMPLANTATION OF DEFIBRILLATOR, HX OF  Cardiac arrest-aborted  Atrial fibrillation  Obesity  Acute renal failure  CKD (chronic kidney disease), stage III  HTN (hypertension)  HLD (hyperlipidemia)  Recurrent UTI  Anemia   Discharge Condition:improved/stable  Diet recommendation: Heart healthy  Filed Weights   03/24/12 1358  Weight: 90.719 kg (200 lb)    History of present illness:   Margaret [name] is a 71 y.o. Caucasian female with history of nonischemic cardiomyopathy and chronic systolic congestive heart failure with an ejection fraction of 35%-40%, A. fib on chronic anticoagulation, hypertension, hyperlipidemia, recurrent urinary tract infections, and obesity who presents with the above complaints. Patient at home chronically uses walker to get around the house and  uses a wheelchair out of the house. Late morning around noon patient had an episode of diarrhea, but she was attempting to get off the commode when she fell and landed on her bottom. Patient's daughter assisted her to the tub and washed the patient. She did not notice any injury. Around 1 o'clock this afternoon while the patient was ambulating down the hallway daughter noticed patient slumping over on the right side with right sided weakness (right upper and lower extremity), and right facial droop. Symptoms lasted for less than a few minutes and all of patient's symptoms resolved. She was brought to the emergency department for further evaluation, she was evaluated by neurology Dr. Amada Jupiter and recommended hospitalization for further management. Patient denies any recent fevers, chills, nausea, vomiting, chest pain, abdominal pain, headaches or vision changes. Patient has chronic shortness of breath with activity which is unchanged. Other than the episode of diarrhea this morning has had no further episodes.  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 A 2-D echocardiogram was ordered and was done on 12/1 the results still pending  At this time-Dr. Donnie Aho his cardiologist was to read and states that he is comfortable with patient been discharged prior to reading echo and he will  do so and follow up with patient patient was maintained on coumadin in the hospital and her INR is remaining therapeutic. Neurology also followed up with patient in an EEG was ordered-done today and the results are  pending and patient is to followup with Dr. Pearlean Brownie outpatient for the results. From neurology standpoint is okay to discharge patient for outpatient followup at this time. PT OT was consulted saw patient in no further skilled therapies were recommended. Active Problems:  DYSLIPIDEMIA  Acute on chronic systolic heart failure  -compensated, her Lasix was held due to her worsened creatinine and she was gently he noted. Creatinine improved, and her Lasix has been decreased to 40 mg daily and she is to follow up outpatient IMPLANTATION OF DEFIBRILLATOR, HX OF Cardiac arrest-aborted  Atrial fibrillation  - rate  Remained controlled in the hospital,  she was maintained on amio and metoprolol which she is to continue upon discharge. She is to followup with Dr. Donnie Aho for PT INR on 12/4 given she is on Cipro and possible interaction with coumadin. Obesity  Acute renal failure/CKD (chronic kidney disease), stage III  -improved with gentle hydration as above   HTN (hypertension)  -continue current meds  HLD (hyperlipidemia)  Recurrent UTI, Escherichia coli   - she was started on empiric Cipro on admission, the urine cultures grew Escherichia coli sensitive to Cipro.has remained afebrile with no leukocytosis. She but discharged on oral Cipro.  Consultants:  neuro Procedures:  Carotid duplex completed.  Preliminary report: Bilateral: No evidence of hemodynamically significant internal carotid artery stenosis. Vertebral artery flow is antegrade.  Echo pending at the time of discharge-Dr. Donnie Aho states is okay for patient to be discharged prior to to him reading echo and he will read it and follow up with patient. EEG done prior to discharge, result pending and patient to followup with Dr. Pearlean Brownie for results Discharge Exam: Filed Vitals:   03/25/12 2202 03/26/12 0309 03/26/12 0631 03/26/12 0924  BP: 114/56 105/46 120/46 109/53  Pulse: 72 70 69 73  Temp:  97.9 F (36.6 C) 97.8 F (36.6 C) 98.3 F (36.8  C)  TempSrc:  Oral Oral Oral  Resp: 18 16 18 18   Height:      Weight:      SpO2:  97% 97% 97%   Exam:  General: sitting up in chair, in NAD, A&OX3  Cardiovascular: RRR  Respiratory: CTAB  Abdomen: soft+BS,NT/ND  Neuro :nl strength, symmetric   Discharge Instructions  Discharge Orders    Future Appointments: Provider: Department: Dept Phone: Center:   03/26/2012 5:00 PM [facility] EEG [phone] None   05/04/2012 11:00 AM Duke Salvia, MD [facility] Main Office [place]) [phone] [email or web address]     Future Orders Please Complete By Expires   Diet - low sodium heart healthy      Increase activity slowly          Medication List     As of 03/26/2012  3:19 PM    TAKE these medications         amiodarone 200 MG tablet   Commonly known as: PACERONE   Take 200 mg by mouth 2 (two) times daily.      calcium carbonate 500 MG chewable tablet   Commonly known as: TUMS - dosed in mg elemental calcium   Chew 2 tablets by mouth daily.      ciprofloxacin 250 MG tablet   Commonly known as: CIPRO   Take 1 tablet (250  mg total) by mouth 2 (two) times daily.      CLARINEX 5 MG tablet   Generic drug: desloratadine   Take 1 tablet by mouth daily.      DETROL LA 4 MG 24 hr capsule   Generic drug: tolterodine   Take 1 tablet by mouth daily.      fluticasone 50 MCG/ACT nasal spray   Commonly known as: FLONASE   1 drop by Nasal route daily.      folic acid 1 MG tablet   Commonly known as: FOLVITE   Take 1 tablet by mouth daily.      furosemide 80 MG tablet   Commonly known as: LASIX   Take 0.5 tablets (40 mg total) by mouth daily.      KLOR-CON M20 20 MEQ tablet   Generic drug: potassium chloride SA   Take 1 tablet by mouth daily.      lovastatin 40 MG tablet   Commonly known as: MEVACOR   Take 1 tablet by mouth daily.      metoprolol tartrate 25 MG tablet   Commonly known as: LOPRESSOR   Take 12.5 mg by mouth Twice daily.       multivitamin with minerals Tabs   Take 1 tablet by mouth daily.      Vitamin D3 400 UNITS Chew   Chew 400 Units by mouth daily.      warfarin 4 MG tablet   Commonly known as: COUMADIN   Take 1 tablet by mouth every evening.           Follow-up Information    Follow up with Allean Found, MD. (in 1-2weeks, call for appt)    Contact information:   [address] [place] [address] [phone]       Follow up with Gates Rigg, MD. (in 2-3weeks, call for appt upon discharge)    Contact information:   [address], [address] [facility] [place] [address] [phone]           The results of significant diagnostics from this hospitalization (including imaging, microbiology, ancillary and laboratory) are listed below for reference.    Significant Diagnostic Studies: Ct Head Wo Contrast  03/24/2012  *RADIOLOGY REPORT*  Clinical Data: Right-sided weakness and slurred speech.  Associated headache.  CT HEAD WITHOUT CONTRAST  Technique:  Contiguous axial images were obtained from the base of the skull through the vertex without contrast.  Comparison: None.  Findings: Fluid of CSF density anterior to the tip of the left temporal lobe appears to represent an arachnoid cyst.  Moderately severe small vessel ischemic changes are present in the periventricular white matter.  Moderate cortical atrophy is present.  There is no evidence of acute hemorrhage, extra-axial fluid collection, mass effect, hydrocephalus or acute infarction. No evidence of mass lesion.  The skull is normal in appearance.  IMPRESSION: Moderately severe small vessel disease.  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   Original Report Authenticated By: Irish Lack, M.D.     Microbiology: Recent Results (from the past 240 hour(s))  URINE CULTURE     Status: Normal   Collection Time   03/24/12  4:40 PM      Component Value  Range Status Comment   Specimen Description URINE, RANDOM   Final    Special Requests NONE   Final    Culture  Setup Time 03/25/2012 01:22   Final    Colony  Count >=100,000 COLONIES/ML   Final    Culture ESCHERICHIA COLI   Final    Report Status 03/26/2012 FINAL   Final    Organism ID, Bacteria ESCHERICHIA COLI   Final      Labs: Basic Metabolic Panel:  Lab 03/25/12 1610 03/24/12 1549  NA 144 138  K 3.5 4.1  CL 107 100  CO2 28 28  GLUCOSE 94 116*  BUN 36* 43*  CREATININE 2.18* 2.47*  CALCIUM 8.7 9.4  MG -- --  PHOS -- --   Liver Function Tests:  Lab 03/24/12 1549  AST 26  ALT 19  ALKPHOS 79  BILITOT 0.8  PROT 7.4  ALBUMIN 3.5   No results found for this basename: LIPASE:5,AMYLASE:5 in the last 168 hours No results found for this basename: AMMONIA:5 in the last 168 hours CBC:  Lab 03/25/12 0550 03/24/12 1549  WBC 4.0 6.4  NEUTROABS -- 5.2  HGB 9.4* 10.1*  HCT 30.6* 32.5*  MCV 97.1 96.2  PLT 144* 178   Cardiac Enzymes:  Lab 03/24/12 1549  CKTOTAL --  CKMB --  CKMBINDEX --  TROPONINI <0.30   BNP: BNP (last 3 results) No results found for this basename: PROBNP:3 in the last 8760 hours CBG:  Lab 03/25/12 1141 03/24/12 1655  GLUCAP 95 106*       Signed:  Braley Luckenbaugh C  [facility] 03/26/2012, 3:19 PM

## 2012-03-26 NOTE — Progress Notes (Signed)
Physical Therapy Treatment Patient Details Name: Margaret [name] MRN: [id number] DOB: 22-Jun-1940 Today's Date: 03/26/2012 Time: 4098-1191 PT Time Calculation (min): 23 min  PT Assessment / Plan / Recommendation Comments on Treatment Session  Pt. presents to be moving well with RW and small shuffled steps that still clear the floor. Pt. encouraged to attempt stairs during session and adamently refused stating she was fine to do them at home and started to become agitated with education and encouragement of attempt. Will aim to attempt stairs with pt. and continue with ambulation.    Follow Up Recommendations  No PT follow up;Supervision for mobility/OOB     Does the patient have the potential to tolerate intense rehabilitation     Barriers to Discharge        Equipment Recommendations  None recommended by OT    Recommendations for Other Services    Frequency Min 4X/week   Plan      Precautions / Restrictions Precautions Precautions: Fall Restrictions Weight Bearing Restrictions: No   Pertinent Vitals/Pain Patient denies pain    Mobility  Bed Mobility Bed Mobility: Supine to Sit;Sitting - Scoot to Edge of Bed Supine to Sit: 6: Modified independent (Device/Increase time) Sitting - Scoot to Edge of Bed: 6: Modified independent (Device/Increase time) Details for Bed Mobility Assistance: Pt. able to get to EOB Wtih no physical (A) and encouragement from SPTA for [facility] to be flat and minimal use of hand rails. Pt. states she cannot get out of hospital bed without some raise of head and rails. Transfers Transfers: Sit to Stand;Stand to Sit Sit to Stand: 4: Min guard;With upper extremity assist;From chair/3-in-1;From toilet Stand to Sit: 4: Min guard;With upper extremity assist;With armrests;To chair/3-in-1;To toilet Details for Transfer Assistance: Pt. min guard for safety and steadiness. Pt. given max VC for proper hand placement when standing to RW and for positioning herself to  lower onto sitting surface. Pt. with tendency to leave RW quite a distance infront of her and backing to sit, given cues for proper technique. Ambulation/Gait Ambulation/Gait Assistance: 4: Min guard Ambulation Distance (Feet): 100 Feet Assistive device: Rolling walker Ambulation/Gait Assistance Details: Pt. ambulated with RW to the bathroom in her room and out into the hallway min guard for safety and steadiness. Pt. given VC for proper hand placement and to keep close to the RW. Pt. encouraged to take larger steps and attempted, but presented to increase gt. speed and not step length. Pt. with short, shuffle-like steps, still clearing the floor, stating that is how she has been walking at home. Gait Pattern: Step-through pattern;Decreased stride length;Shuffle;Narrow base of support Gait velocity: slow gait speed Stairs: No Wheelchair Mobility Wheelchair Mobility: No Modified Rankin (Stroke Patients Only) Pre-Morbid Rankin Score: Moderate disability Modified Rankin: Moderately severe disability    Exercises General Exercises - Lower Extremity Long Arc Quad: AROM;Both;Other reps (comment);Seated (8) Heel Slides: AROM;Both;Other reps (comment);Seated (8) Straight Leg Raises: AROM;Both;Other reps (comment);Seated (8) Hip Flexion/Marching: AROM;Both;Other reps (comment);Seated (8)   PT Diagnosis:    PT Problem List:   PT Treatment Interventions:     PT Goals Acute Rehab PT Goals PT Goal Formulation: With patient Time For Goal Achievement: 04/01/12 Potential to Achieve Goals: Good Pt will go Sit to Stand: with modified independence PT Goal: Sit to Stand - Progress: Progressing toward goal Pt will go Stand to Sit: with modified independence PT Goal: Stand to Sit - Progress: Progressing toward goal Pt will Transfer Bed to Chair/Chair to Bed: with modified independence Pt  will Ambulate: >150 feet;with modified independence;with least restrictive assistive device PT Goal: Ambulate -  Progress: Progressing toward goal Pt will Go Up / Down Stairs: 3-5 stairs;with modified independence;with rail(s)  Visit Information  Last PT Received On: 03/26/12 Assistance Needed: +1    Subjective Data  Subjective: "What are you wanting me to do?" Patient Stated Goal: To go home   Cognition  Overall Cognitive Status: Appears within functional limits for tasks assessed/performed Arousal/Alertness: Awake/alert Orientation Level: Appears intact for tasks assessed Behavior During Session: [facility] for tasks performed    Balance  Balance Balance Assessed: Yes Static Standing Balance Static Standing - Balance Support: Right upper extremity supported;Left upper extremity supported;Bilateral upper extremity supported Static Standing - Level of Assistance: 5: Stand by assistance Static Standing - Comment/# of Minutes: Pt. standing during rest breaks with conversation and able to maintain her balance min guard for steadiness with varying levels of UE support with RW. Pt.standing during self clean up from toileting.  End of Session PT - End of Session Equipment Utilized During Treatment: Gait belt Activity Tolerance: Patient tolerated treatment well Patient left: in chair;with call bell/phone within reach Nurse Communication: Mobility status    Mertie Clause, SPTA 03/26/2012, 9:38 AM

## 2012-03-26 NOTE — Progress Notes (Signed)
Kailen Hinkle, PTA [phone] 03/26/2012  

## 2012-03-27 DIAGNOSIS — I428 Other cardiomyopathies: Secondary | ICD-10-CM | POA: Insufficient documentation

## 2012-03-27 DIAGNOSIS — Z7901 Long term (current) use of anticoagulants: Secondary | ICD-10-CM | POA: Insufficient documentation

## 2012-03-27 NOTE — Procedures (Signed)
History: 71 yo F with transient episode of right sided weakness and aphasia.    Background: There is a well defined posterior dominant rhythm of 9.5 Hz that attenuates with eye opening.   Photic stimulation: Physiologic driving is present  EEG Diagnosis: 1) Normal EEG  Clinical Interpretation: This normal EEG is recorded in the waking. There was no seizure or seizure predisposition recorded on this study.   [name], MD [facility] [phone]  If 7pm- 7am, please page neurology on call at [phone].

## 2012-05-04 ENCOUNTER — Encounter: Payer: Medicare Other | Admitting: Internal Medicine

## 2012-05-14 ENCOUNTER — Encounter: Payer: Self-pay | Admitting: Cardiology

## 2012-05-14 DIAGNOSIS — Z95810 Presence of automatic (implantable) cardiac defibrillator: Secondary | ICD-10-CM

## 2012-05-14 DIAGNOSIS — Z8674 Personal history of sudden cardiac arrest: Secondary | ICD-10-CM

## 2012-05-14 DIAGNOSIS — I4891 Unspecified atrial fibrillation: Secondary | ICD-10-CM

## 2012-05-14 DIAGNOSIS — Z8673 Personal history of transient ischemic attack (TIA), and cerebral infarction without residual deficits: Secondary | ICD-10-CM

## 2012-05-14 NOTE — Progress Notes (Signed)
Patient ID: Margaret [name], female   DOB: 05/12/1940, 72 y.o.   MRN: [id number] Primary Care Provider: Merri Brunette ____________________________ CURRENT DIAGNOSES  1. Congestive Heart Failure Chronic Systolic  2. Chronic Kidney Disease (Stage 3)  3. Surgery-S/P Mitral Valve Repair  4. AICD in situ  5. Long Term Use Anticoagulant  6. Mitral Valve Disorder  7. Cardiomyopathy Idiopathic  8. Arrhythmia-Atrial Fibrillation  9. Hyperlipidemia  10. Hypertension-Essential (Benign)  11. Personal History Of Sudden Cardiac Arrest  12. Anoxic Brain Damage ____________________________ ALLERGIES  NKDA ____________________________ MEDICATIONS  1. Clarinex 5 mg tablet, 1 p.o. q.d.  2. folic acid 1 mg Tablet, 1 p.o. daily  3. Detrol LA 4 mg Capsule, Ext Release 24 hr, 1 p.o. daily  4. metoprolol tartrate 25 mg Tablet, 1/2 tab b.i.d.  5. lovastatin 40 mg Tablet, 1 p.o. daily  6. Tums 300 mg (750 mg) tablet, chewable, 1 p.o. q.d.  7. Flonase 50 mcg/actuation Spray, Suspension, 1 p.o. q.d.  8. Klor-Con M20 20 mEq tablet,ER particles/crystals, 1 p.o. daily  9. amiodarone 200 mg tablet, 1 p.o. daily  10. furosemide 80 mg tablet, BID  11. warfarin 1 mg tablet, 1 p.o. daily ____________________________ CHIEF COMPLAINTS  Followup of Cardiomyopathy Idiopathic  Followup of Hypertension-Essential (Benign) ____________________________ HISTORY OF PRESENT ILLNESS  Patient seen for cardiac followup. She is doing fairly well and has not had recurrent dyspnea or TIA since she was here. She continues to have a moderate amount of dyspnea with exertion. She does not have any orthopnea or PND. She is having no angina. Her warfarin has been quite labile and we have been trying to get this regulated and under control. She has had no side effects from amiodarone. ____________________________ PAST HISTORY  Past Medical Illnesses:  hypertension, hyperlipidemia, obesity, prior cardiac arrest with anoxic brain damage,  TIA november 2013, achalsia;  Cardiovascular Illnesses:  cardiomyopathy(idiopathic), sudden death, mitral regurgitation, atrial fibrillation;  Surgical Procedures:  AICD implant, cholecystectomy (lap), AICD generator replacement April 2003, repositioning of defibrillator, explant of generator for erosion 2006;  Cardiology Procedures-Invasive:  cardiac cath (left) August 2011, TEE Cardioversion, cardiac cath (right and left) February 2012, MV repair, maze procedure, removal of defib wires and placement of defibrillator 07/15/10 Dr. Cornelius Moras;  Cardiology Procedures-Noninvasive:  echocardiogram November 2011, TEE December 2012, echocardiogram May 2012, echocardiogram December 2013;  Cardiac Cath Results:  normal coronary arteries;  LVEF of 35% documented via echocardiogram on 03/25/2012  CHADS Score:  4  CHA2DS2-VASC Score:  5 ____________________________ CARDIO-PULMONARY TEST DATES EKG Date:  02/01/2012;   Cardiac Cath Date:  06/14/2010;  Holter/Event Monitor Date: 02/03/2009;  Echocardiography Date: 03/25/2012;  Chest Xray Date: 04/01/2011;   ____________________________ SOCIAL HISTORY Alcohol Use:  does not use alcohol;  Smoking:  does not smoke;  Diet:  regular diet;  Lifestyle:  married;  Exercise:  exercise is limited due to physical disability;  Occupation:  disabled;  Residence:  lives with husband;   ____________________________ REVIEW OF SYSTEMS General:  malaise and fatigue  Eyes:  wears eye glasses/contact lenses, cataract extraction bilaterally Respiratory:  dyspnea with exertion Cardiovascular:  please review HPI  Abdominal:  denies dyspepsia, GI bleeding, constipation, or diarrhea  Genitourinary-Female:  frequency, hesitancy, stress incontinence  Musculoskeletal:  arthritis of the left knee  Neurological:  memory loss ____________________________ PHYSICAL EXAMINATION VITAL SIGNS  Blood Pressure:  104/64 Standing, Right arm, large cuff   Pulse:  78/min. Weight:  207.00 lbs. Height:  66"BMI:  33  Constitutional:  pleasant white female, in  no acute distress, mildly obese, in wheelcair Skin:  warm and dry to touch, no apparent skin lesions, or masses noted. Head:  normocephalic, normal hair pattern, no masses or tenderness Neck:  neck supple without masses. No JVD, thyromegaly or bruits Chest:  healed median sternotomy scar, normal symmetry, diminished breath sounds both bases Cardiac:  regular rhythm, normal S1 and S2, No S3 or S4, no murmurs, gallops or rubs detected. Abdomen:  AICD present in LUQ Peripheral Pulses:  the femoral,dorsalis pedis, and posterior tibial pulses are full and equal bilaterally with no bruits auscultated. Extremities & Back:  1+ edema Neurological:  unable to walk, slight right facial droop ____________________________ MOST RECENT LIPID PANEL 03/25/12  CHOL TOTL 135 mg/dl, LDL 54 calc, HDL 60 mg/dl, TRIGLYCER 409 mg/dl and CHOL/HDL 2.3 (Calc) ____________________________ IMPRESSIONS/PLAN   1. Chronic systolic heart failure 2. Atrial fibrillation currently maintaining sinus rhythm on the amiodarone 3. Functioning implantable defibrillator 4. Chronic warfarin anticoagulation  Recommendations:  We're trying to get her warfarin adjusted and it has been a real problem with home health even seeing her. Physical therapy is working with her but she is not making a lot of progress. I will see her in followup in 3 months. Call if there are problems. ____________________________ TODAYS ORDERS  1. Return Visit: 3 months  2. Comprehensive Metabolic Panel: Today  3. TSH: Today                       ____________________________ Cardiology Physician:  [name] MD [facility]

## 2012-05-31 ENCOUNTER — Encounter: Payer: Self-pay | Admitting: Internal Medicine

## 2012-05-31 ENCOUNTER — Ambulatory Visit (INDEPENDENT_AMBULATORY_CARE_PROVIDER_SITE_OTHER): Payer: Medicare Other | Admitting: Internal Medicine

## 2012-05-31 VITALS — BP 100/60 | HR 70 | Wt 207.0 lb

## 2012-05-31 DIAGNOSIS — Z95810 Presence of automatic (implantable) cardiac defibrillator: Secondary | ICD-10-CM

## 2012-05-31 DIAGNOSIS — I4891 Unspecified atrial fibrillation: Secondary | ICD-10-CM

## 2012-05-31 DIAGNOSIS — Z8674 Personal history of sudden cardiac arrest: Secondary | ICD-10-CM

## 2012-05-31 DIAGNOSIS — I428 Other cardiomyopathies: Secondary | ICD-10-CM

## 2012-05-31 LAB — ICD DEVICE OBSERVATION
AL IMPEDENCE ICD: 551 Ohm
AL THRESHOLD: 1 V
ATRIAL PACING ICD: 82.12 pct
BAMS-0001: 170 {beats}/min
BATTERY VOLTAGE: 3.0801 V
CHARGE TIME: 9.198 s
PACEART VT: 0
TOT-0002: 0
TOT-0006: 20120323000000
TZAT-0001ATACH: 3
TZAT-0001FASTVT: 1
TZAT-0012ATACH: 150 ms
TZAT-0012ATACH: 150 ms
TZAT-0012ATACH: 150 ms
TZAT-0012SLOWVT: 200 ms
TZAT-0018ATACH: NEGATIVE
TZAT-0018FASTVT: NEGATIVE
TZAT-0018SLOWVT: NEGATIVE
TZAT-0019ATACH: 6 V
TZAT-0019FASTVT: 8 V
TZAT-0019SLOWVT: 8 V
TZAT-0020ATACH: 1.5 ms
TZAT-0020ATACH: 1.5 ms
TZAT-0020SLOWVT: 1.5 ms
TZON-0003ATACH: 350 ms
TZON-0003SLOWVT: 360 ms
TZON-0003VSLOWVT: 370 ms
TZON-0004SLOWVT: 16
TZON-0005SLOWVT: 12
TZST-0001ATACH: 4
TZST-0001ATACH: 5
TZST-0001FASTVT: 2
TZST-0001FASTVT: 3
TZST-0001FASTVT: 6
TZST-0001SLOWVT: 2
TZST-0001SLOWVT: 6
TZST-0002ATACH: NEGATIVE
TZST-0002FASTVT: NEGATIVE
TZST-0002FASTVT: NEGATIVE
TZST-0002SLOWVT: NEGATIVE
TZST-0002SLOWVT: NEGATIVE
VENTRICULAR PACING ICD: 77.81 pct

## 2012-05-31 NOTE — Assessment & Plan Note (Signed)
No recurrent atrial arrhythmias. She remains on amiodarone I will defer surveillance monitoring to Dr. Donnie Aho.

## 2012-05-31 NOTE — Patient Instructions (Signed)
Your physician wants you to follow-up in: 1 year with Dr. Klein. You will receive a reminder letter in the mail two months in advance. If you don't receive a letter, please call our office to schedule the follow-up appointment.  Your physician recommends that you continue on your current medications as directed. Please refer to the Current Medication list given to you today.  

## 2012-05-31 NOTE — Assessment & Plan Note (Signed)
No intercurrent Ventricular tachycardia  

## 2012-05-31 NOTE — Assessment & Plan Note (Signed)
The patient's device was interrogated.  The information was reviewed. No changes were made in the programming.    

## 2012-05-31 NOTE — Progress Notes (Signed)
HPI  Margaret [name] is a 72 y.o. female  Seen in followup for aborted cardiac arrest status post ICD implantation.     In March 2012 she was admitted to hospital with severe mitral regurgitation and was subjected to intraoperative repair at which time a CRT defibrillator system With epicardial patches was implanted by Dr. Cornelius Moras. Because of a narrow QRS, the LV leads were not attached.As she had a history of paroxysmal atrial fibrillation she also underwent a Cox-Maze procedure.    She has had a history of atrial flutters resulting in ICD shocks inappropriately.  We were also able to terminated with pacing. She also had worsening of her left ventricular function that was thought to be possibly rate related. Normalization however did not occur an echo 2013-December demonstrated EF of 35%  She saw Dr. Donnie Aho recently and will see him again in just a few weeks. There has been extensive problems with peripheral edema that she treats with diuresis and keeping her legs elevated as well as some isometric contraction    Past Medical History  Diagnosis Date  . NICM (nonischemic cardiomyopathy)     EF 35%; cath 2/12 no CAD  . Systolic CHF, chronic   . Severe mitral regurgitation     s/p complex mitral valve repair with cox maze + LAA clipping, removal of RV lead, and implantation of CRT-D in abdomen  . Cardiac arrest - ventricular fibrillation     in 1990s  . Atrial fibrillation     amiodarone  . HTN (hypertension)   . HLD (hyperlipidemia)   . Achalasia     s/p dilation  . Recurrent UTI   . Obesity   . Arthritis   . Anoxic brain injury 1996    s/p cardiac arrest     Past Surgical History  Procedure Date  . Defibrillator generator explantation and reimplantation; and 08/02/2001  . Device migration with anticipated pocket revision 10/29/2003  . 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 Attempted implantation of an implantable cardioverter- 12/30/2004   . Cardioversion 04/14/2010  . Median sternotomy 07/16/2010  . Mitral valve repair 07/16/2010  . Cox maze procedure (complete biatrial lesion set with clipping of 07/16/2010  . Removal of old endocardial right ventricular defibrillator lead. 07/16/2010  . Placement of dual chamber pacemaker and implantable cardiac 07/16/2010  . Placement of swan ganz pulmonary artery catheter via left femoral access. 07/16/2010    Current Outpatient Prescriptions  Medication Sig Dispense Refill  . amiodarone (PACERONE) 200 MG tablet Take 200 mg by mouth 2 (two) times daily.      . calcium carbonate (TUMS - DOSED IN MG ELEMENTAL CALCIUM) 500 MG chewable tablet Chew 2 tablets by mouth daily.      . Cholecalciferol (VITAMIN D3) 400 UNITS CHEW Chew 400 Units by mouth daily.      Marland Kitchen CLARINEX 5 MG tablet Take 1 tablet by mouth daily.      Marland Kitchen DETROL LA 4 MG 24 hr capsule Take 1 tablet by mouth daily.      . fluticasone (FLONASE) 50 MCG/ACT nasal spray 1 drop by Nasal route daily.      . folic acid (FOLVITE) 1 MG tablet Take 1 tablet by mouth daily.      . furosemide (LASIX) 80 MG tablet Take 0.5 tablets (40 mg total) by mouth daily.      Marland Kitchen KLOR-CON M20 20 MEQ tablet Take 1 tablet by mouth daily.      Marland Kitchen  lisinopril (PRINIVIL,ZESTRIL) 20 MG tablet Take 20 mg by mouth daily.      Marland Kitchen lovastatin (MEVACOR) 40 MG tablet Take 1 tablet by mouth daily.      . metoprolol tartrate (LOPRESSOR) 25 MG tablet Take 12.5 mg by mouth Twice daily.       . Multiple Vitamin (MULTIVITAMIN WITH MINERALS) TABS Take 1 tablet by mouth daily.      Marland Kitchen warfarin (COUMADIN) 4 MG tablet Take 1 tablet by mouth every evening.         No Known Allergies  Review of Systems negative except from HPI and PMH  Physical Exam BP 100/60  Pulse 70  Wt 207 lb (93.895 kg) BP 100/60  Pulse 70  Wt 207 lb (93.895 kg)  Well developed and nourished in no acute distress she sits in a wheelchair HENT normal lip stick all over teeth Neck supple   Clear Regular rate and rhythm, no murmurs or gallops Abd-soft with active BS No Clubbing cyanosis 4+ edema Skin-warm and dry; lower extremity erythema  A & Oriented  Grossly normal sensory and motor function  ECG demonstrates ventricular pacing   surface P wave activity is not discernible is present based on device interrogation  Assessment and  Plan

## 2012-06-09 ENCOUNTER — Emergency Department (HOSPITAL_COMMUNITY): Payer: Medicare Other

## 2012-06-09 ENCOUNTER — Emergency Department (HOSPITAL_COMMUNITY)
Admission: EM | Admit: 2012-06-09 | Discharge: 2012-06-10 | Disposition: A | Discharge status: Home / Self Care | Payer: Medicare Other | Attending: Emergency Medicine | Admitting: Emergency Medicine

## 2012-06-09 DIAGNOSIS — I252 Old myocardial infarction: Secondary | ICD-10-CM | POA: Insufficient documentation

## 2012-06-09 DIAGNOSIS — Z79899 Other long term (current) drug therapy: Secondary | ICD-10-CM | POA: Insufficient documentation

## 2012-06-09 DIAGNOSIS — Z87440 Personal history of urinary (tract) infections: Secondary | ICD-10-CM | POA: Insufficient documentation

## 2012-06-09 DIAGNOSIS — Z7901 Long term (current) use of anticoagulants: Secondary | ICD-10-CM | POA: Insufficient documentation

## 2012-06-09 DIAGNOSIS — I10 Essential (primary) hypertension: Secondary | ICD-10-CM | POA: Insufficient documentation

## 2012-06-09 DIAGNOSIS — R269 Unspecified abnormalities of gait and mobility: Secondary | ICD-10-CM | POA: Insufficient documentation

## 2012-06-09 DIAGNOSIS — I4891 Unspecified atrial fibrillation: Secondary | ICD-10-CM | POA: Insufficient documentation

## 2012-06-09 DIAGNOSIS — R110 Nausea: Secondary | ICD-10-CM | POA: Insufficient documentation

## 2012-06-09 DIAGNOSIS — R197 Diarrhea, unspecified: Secondary | ICD-10-CM | POA: Insufficient documentation

## 2012-06-09 DIAGNOSIS — Z950 Presence of cardiac pacemaker: Secondary | ICD-10-CM | POA: Insufficient documentation

## 2012-06-09 DIAGNOSIS — E669 Obesity, unspecified: Secondary | ICD-10-CM | POA: Insufficient documentation

## 2012-06-09 DIAGNOSIS — Z8739 Personal history of other diseases of the musculoskeletal system and connective tissue: Secondary | ICD-10-CM | POA: Insufficient documentation

## 2012-06-09 DIAGNOSIS — E785 Hyperlipidemia, unspecified: Secondary | ICD-10-CM | POA: Insufficient documentation

## 2012-06-09 DIAGNOSIS — Z95810 Presence of automatic (implantable) cardiac defibrillator: Secondary | ICD-10-CM | POA: Insufficient documentation

## 2012-06-09 DIAGNOSIS — I428 Other cardiomyopathies: Secondary | ICD-10-CM | POA: Insufficient documentation

## 2012-06-09 DIAGNOSIS — I509 Heart failure, unspecified: Secondary | ICD-10-CM | POA: Insufficient documentation

## 2012-06-09 DIAGNOSIS — Z8679 Personal history of other diseases of the circulatory system: Secondary | ICD-10-CM | POA: Insufficient documentation

## 2012-06-09 DIAGNOSIS — K529 Noninfective gastroenteritis and colitis, unspecified: Secondary | ICD-10-CM

## 2012-06-09 LAB — CBC WITH DIFFERENTIAL/PLATELET
Eosinophils Relative: 1 % (ref 0–5)
HCT: 30.8 % — ABNORMAL LOW (ref 36.0–46.0)
Hemoglobin: 9.7 g/dL — ABNORMAL LOW (ref 12.0–15.0)
Lymphocytes Relative: 9 % — ABNORMAL LOW (ref 12–46)
MCV: 96 fL (ref 78.0–100.0)
Monocytes Absolute: 0.2 10*3/uL (ref 0.1–1.0)
Monocytes Relative: 5 % (ref 3–12)
Neutro Abs: 4.2 10*3/uL (ref 1.7–7.7)
RDW: 15.1 % (ref 11.5–15.5)
WBC: 4.9 10*3/uL (ref 4.0–10.5)

## 2012-06-09 LAB — COMPREHENSIVE METABOLIC PANEL
AST: 22 U/L (ref 0–37)
BUN: 38 mg/dL — ABNORMAL HIGH (ref 6–23)
CO2: 26 mEq/L (ref 19–32)
Calcium: 8.9 mg/dL (ref 8.4–10.5)
Chloride: 100 mEq/L (ref 96–112)
Creatinine, Ser: 2.23 mg/dL — ABNORMAL HIGH (ref 0.50–1.10)
GFR calc Af Amer: 24 mL/min — ABNORMAL LOW (ref >90)
GFR calc non Af Amer: 21 mL/min — ABNORMAL LOW (ref >90)
Glucose, Bld: 106 mg/dL — ABNORMAL HIGH (ref 70–99)
Total Bilirubin: 0.8 mg/dL (ref 0.3–1.2)

## 2012-06-09 LAB — APTT: aPTT: 41 seconds — ABNORMAL HIGH (ref 24–37)

## 2012-06-09 LAB — PROTIME-INR: INR: 2.23 — ABNORMAL HIGH (ref 0.00–1.49)

## 2012-06-09 LAB — PRO B NATRIURETIC PEPTIDE: Pro B Natriuretic peptide (BNP): 3502 pg/mL — ABNORMAL HIGH (ref 0–125)

## 2012-06-09 LAB — TROPONIN I: Troponin I: <0.3 ng/mL (ref <0.30)

## 2012-06-09 NOTE — ED Notes (Signed)
Pt with bilat lower extremity pitting edema, sts this is mildly worsened from baseline

## 2012-06-09 NOTE — ED Notes (Signed)
Pt arrived from home via [facility] c/o Nausea x 12 hours, and Diarrhea x 1 week. EMS VS 160/84 sitting and standing, HR 84 Sitting and standing. Bilateral lower extremity noted, and pt states this is her baseline. She is ambulatory without assistance. NKDA. Hx CHF, HTN, Hypercholesteremia, and Cardiac arrest.

## 2012-06-10 MED ORDER — ONDANSETRON HCL 4 MG PO TABS: 4.0000 mg | ORAL_TABLET | Freq: Three times a day (TID) | ORAL | Status: DC | PRN

## 2012-06-10 MED ORDER — SODIUM CHLORIDE 0.9 % IV BOLUS (SEPSIS)
500.0000 mL | Freq: Once | INTRAVENOUS | Status: AC
  Administered 2012-06-10: 500 mL via INTRAVENOUS

## 2012-06-10 NOTE — ED Provider Notes (Signed)
Date: 06/10/2012  Rate: 70  Rhythm: normal sinus rhythm  QRS Axis: normal  Intervals: normal  ST/T Wave abnormalities: normal  Conduction Disutrbances: none  Narrative Interpretation:   Old EKG Reviewed: No significant changes noted     Lyanne Co, MD 06/10/12 [phone]

## 2012-06-20 NOTE — ED Provider Notes (Signed)
History     CSN: [id number]  Arrival date & time 06/09/12  2128   First MD Initiated Contact with Patient 06/09/12 2247      Chief Complaint  Patient presents with  . Diarrhea    (Consider location/radiation/quality/duration/timing/severity/associated sxs/prior treatment) HPI Comments: 72 yo female BIBEMS c/o nausea for the last 12 hours and diarrhea for the past week. Pt is with family who states pt has had persistent diarrhea every other day for months. She is being followed by a gastroenterologist who requested stool samples that were just dropped off today. Pt family states they are here today because they couldn't wait for the follow up appointment and hope the ED can offer some relief for the pt.   PMHx significant for CHF, HTN, HLD, MI, a-fib.  Pt is on Lasix 40mg  and coumadin.  Patient is a 72 y.o. female presenting with diarrhea.  Diarrhea Associated symptoms: no diaphoresis, no fever, no headaches and no vomiting     Past Medical History  Diagnosis Date  . NICM (nonischemic cardiomyopathy)     EF 35%; cath 2/12 no CAD  . Systolic CHF, chronic   . Severe mitral regurgitation     s/p complex mitral valve repair with cox maze + LAA clipping, removal of RV lead, and implantation of CRT-D in abdomen  . Cardiac arrest - ventricular fibrillation     in 1990s  . Atrial fibrillation     amiodarone  . HTN (hypertension)   . HLD (hyperlipidemia)   . Achalasia     s/p dilation  . Recurrent UTI   . Obesity   . Arthritis   . Anoxic brain injury 1996    s/p cardiac arrest     Past Surgical History  Procedure Laterality Date  . Defibrillator generator explantation and reimplantation; and  08/02/2001  . Device migration with anticipated pocket revision  10/29/2003  . 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 Attempted implantation of an implantable cardioverter-  12/30/2004  . Cardioversion  04/14/2010  . Median sternotomy   07/16/2010  . Mitral valve repair  07/16/2010  . Cox maze procedure (complete biatrial lesion set with clipping of  07/16/2010  . Removal of old endocardial right ventricular defibrillator lead.  07/16/2010  . Placement of dual chamber pacemaker and implantable cardiac  07/16/2010  . Placement of swan ganz pulmonary artery catheter via left femoral access.  07/16/2010    Family History  Problem Relation Age of Onset  . Stroke Mother   . Lung cancer Father     History  Substance Use Topics  . Smoking status: Never Smoker   . Smokeless tobacco: Not on file  . Alcohol Use: No    OB History   Grav Para Term Preterm Abortions TAB SAB Ect Mult Living                  Review of Systems  Constitutional: Negative for fever and diaphoresis.  HENT: Negative for neck pain and neck stiffness.   Eyes: Negative for visual disturbance.  Respiratory: Negative for apnea, chest tightness and shortness of breath.   Cardiovascular: Negative for chest pain and palpitations.  Gastrointestinal: Positive for nausea and diarrhea. Negative for vomiting and constipation.       No blood  Genitourinary: Negative for dysuria.  Musculoskeletal: Positive for gait problem.       Pt ambulates with walker and is becoming increasingly unsteady on her feet  Skin:  Negative for rash.  Neurological: Negative for dizziness, weakness, light-headedness, numbness and headaches.    Allergies  Review of patient's allergies indicates no known allergies.  Home Medications   Current Outpatient Rx  Name  Route  Sig  Dispense  Refill  . amiodarone (PACERONE) 200 MG tablet   Oral   Take 200 mg by mouth 2 (two) times daily.         . calcium carbonate (TUMS - DOSED IN MG ELEMENTAL CALCIUM) 500 MG chewable tablet   Oral   Chew 2 tablets by mouth every morning. Flavored TUMS         . CLARINEX 5 MG tablet   Oral   Take 1 tablet by mouth daily.         Marland Kitchen DETROL LA 4 MG 24 hr capsule   Oral   Take 1  tablet by mouth daily.         . fluticasone (FLONASE) 50 MCG/ACT nasal spray   Nasal   Place 1 drop into the nose at bedtime.          . folic acid (FOLVITE) 1 MG tablet   Oral   Take 1 tablet by mouth daily.         . furosemide (LASIX) 80 MG tablet   Oral   Take 0.5 tablets (40 mg total) by mouth daily.         Marland Kitchen KLOR-CON M20 20 MEQ tablet   Oral   Take 1 tablet by mouth daily.         Marland Kitchen lisinopril (PRINIVIL,ZESTRIL) 20 MG tablet   Oral   Take 20 mg by mouth daily.         Marland Kitchen lovastatin (MEVACOR) 40 MG tablet   Oral   Take 1 tablet by mouth daily.         . metoprolol tartrate (LOPRESSOR) 25 MG tablet   Oral   Take 12.5 mg by mouth Twice daily.          . Multiple Vitamin (MULTIVITAMIN WITH MINERALS) TABS   Oral   Take 1 tablet by mouth daily.         . Probiotic Product (ALIGN PO)   Oral   Take 1 tablet by mouth daily.         Marland Kitchen warfarin (COUMADIN) 1 MG tablet   Oral   Take 1-2 mg by mouth See admin instructions. Takes 2mg  on Sat & Sun Takes 1mg  Mon-Fri         . ondansetron (ZOFRAN) 4 MG tablet   Oral   Take 1 tablet (4 mg total) by mouth every 8 (eight) hours as needed for nausea.   10 tablet   0     BP 94/44  Pulse 69  Temp(Src) 98.4 F (36.9 C) (Oral)  Resp 10  SpO2 93%  Physical Exam  Nursing note and vitals reviewed. Constitutional: She is oriented to person, place, and time. She appears well-developed and well-nourished. No distress.  HENT:  Head: Normocephalic and atraumatic.  Eyes: Conjunctivae and EOM are normal.  Neck: Normal range of motion. Neck supple.  No meningeal signs  Cardiovascular: Normal rate, regular rhythm and normal heart sounds.  Exam reveals no gallop and no friction rub.   No murmur heard. Pulmonary/Chest: Effort normal and breath sounds normal. No respiratory distress. She has no wheezes. She has no rales. She exhibits no tenderness.  Abdominal: Soft. Bowel sounds are normal. She exhibits no  distension. There is  no tenderness. There is no rebound and no guarding.  Musculoskeletal: Normal range of motion. She exhibits edema. She exhibits no tenderness.  Bilateral peripheral edema of lower extremities  Neurological: She is alert and oriented to person, place, and time. No cranial nerve deficit.  Skin: Skin is warm and dry. She is not diaphoretic. No erythema.    ED Course  Procedures (including critical care time)  Labs Reviewed  CBC WITH DIFFERENTIAL - Abnormal; Notable for the following:    RBC 3.21 (*)    Hemoglobin 9.7 (*)    HCT 30.8 (*)    Neutrophils Relative 86 (*)    Lymphocytes Relative 9 (*)    Lymphs Abs 0.4 (*)    All other components within normal limits  COMPREHENSIVE METABOLIC PANEL - Abnormal; Notable for the following:    Glucose, Bld 106 (*)    BUN 38 (*)    Creatinine, Ser 2.23 (*)    Albumin 3.3 (*)    GFR calc non Af Amer 21 (*)    GFR calc Af Amer 24 (*)    All other components within normal limits  PRO B NATRIURETIC PEPTIDE - Abnormal; Notable for the following:    Pro B Natriuretic peptide (BNP) 3502.0 (*)    All other components within normal limits  PROTIME-INR - Abnormal; Notable for the following:    Prothrombin Time 23.7 (*)    INR 2.23 (*)    All other components within normal limits  APTT - Abnormal; Notable for the following:    aPTT 41 (*)    All other components within normal limits  TROPONIN I  Dg Chest Portable 1 View  06/09/2012  *RADIOLOGY REPORT*  Clinical Data: Diarrhea  PORTABLE CHEST - 1 VIEW  Comparison: 04/01/2011  Findings: Previous median sternotomy CABG procedure.  Epicardial pacer wires are noted.  The old fragment within the region of the left innominate vein from prior AICD lead is stable in position. No pleural effusion or edema.  No airspace consolidation.  IMPRESSION:  1.  No acute cardiopulmonary abnormality.   Original Report Authenticated By: Signa Kell, M.D.   No results found. Date: 06/10/2012  Rate: 70   Rhythm: normal sinus rhythm  QRS Axis: normal  Intervals: normal  ST/T Wave abnormalities: normal  Conduction Disutrbances: none  Narrative Interpretation:  Old EKG Reviewed: No significant changes noted   1. Chronic diarrhea       MDM  EKG showed normal sinus rhythm. Chest xray showed no acute cardiopulmonary abnormality. Abnormal labs discussed with pt and family with instructions to follow up with pt primary care provider. No criteria for admission at this time and does not appear to be any evidence of an acute emergency medical condition. Pt appears stable for discharge with appropriate outpatient follow up .Diagnosis was discussed with patient and family who verbalizes understanding and is agreeable to discharge. Pt case discussed with Dr. Patria Mane who agrees with my plan.     Glade Nurse, [place] 06/20/12 [phone]

## 2012-06-21 NOTE — ED Provider Notes (Signed)
Medical screening examination/treatment/procedure(s) were performed by non-physician practitioner and as supervising physician I was immediately available for consultation/collaboration.   Andrey Hoobler M Niyam Bisping, MD 06/21/12 0345 

## 2012-11-09 ENCOUNTER — Other Ambulatory Visit: Payer: Self-pay | Admitting: Gastroenterology

## 2012-11-09 ENCOUNTER — Ambulatory Visit
Admission: RE | Admit: 2012-11-09 | Discharge: 2012-11-09 | Disposition: A | Discharge status: Home / Self Care | Payer: Medicare Other | Source: Ambulatory Visit | Attending: Gastroenterology | Admitting: Gastroenterology

## 2012-11-09 DIAGNOSIS — R197 Diarrhea, unspecified: Secondary | ICD-10-CM

## 2012-12-05 ENCOUNTER — Ambulatory Visit
Admission: RE | Admit: 2012-12-05 | Discharge: 2012-12-05 | Disposition: A | Discharge status: Home / Self Care | Payer: Medicare Other | Source: Ambulatory Visit | Attending: Family Medicine | Admitting: Family Medicine

## 2012-12-05 ENCOUNTER — Other Ambulatory Visit: Payer: Self-pay | Admitting: Family Medicine

## 2012-12-05 DIAGNOSIS — R251 Tremor, unspecified: Secondary | ICD-10-CM

## 2012-12-31 ENCOUNTER — Ambulatory Visit (INDEPENDENT_AMBULATORY_CARE_PROVIDER_SITE_OTHER): Payer: Medicare Other | Admitting: Neurology

## 2012-12-31 ENCOUNTER — Encounter: Payer: Self-pay | Admitting: Neurology

## 2012-12-31 VITALS — BP 140/79 | HR 70 | Temp 97.9°F

## 2012-12-31 DIAGNOSIS — Z9181 History of falling: Secondary | ICD-10-CM

## 2012-12-31 DIAGNOSIS — G931 Anoxic brain damage, not elsewhere classified: Secondary | ICD-10-CM

## 2012-12-31 DIAGNOSIS — R296 Repeated falls: Secondary | ICD-10-CM

## 2012-12-31 DIAGNOSIS — R269 Unspecified abnormalities of gait and mobility: Secondary | ICD-10-CM

## 2012-12-31 DIAGNOSIS — I10 Essential (primary) hypertension: Secondary | ICD-10-CM

## 2012-12-31 DIAGNOSIS — R2689 Other abnormalities of gait and mobility: Secondary | ICD-10-CM

## 2012-12-31 DIAGNOSIS — G459 Transient cerebral ischemic attack, unspecified: Secondary | ICD-10-CM

## 2012-12-31 DIAGNOSIS — E785 Hyperlipidemia, unspecified: Secondary | ICD-10-CM

## 2012-12-31 DIAGNOSIS — N183 Chronic kidney disease, stage 3 unspecified: Secondary | ICD-10-CM

## 2012-12-31 NOTE — Progress Notes (Signed)
Subjective:    Patient ID: Margaret [name] is a 72 y.o. female.  HPI  Huston Foley, MD, PhD [facility] [address] [address] [place], [place] [address]  Dear Dr. Katrinka Blazing,  I saw your patient, naama sappington, upon your kind request in my neurologic clinic today for initial consultation of her weakness and tremor. The patient is accompanied by her husband today. As you know, [name] is a very friendly 72 year old right-handed woman with an underlying medical history of hypertension, congestive heart failure, allergic rhinitis, hyperlipidemia, A. 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 She reports progressive weakness for the past 2 years, and has had multiple falls. She has had an action tremor in both hands in the last few months, per husband. She has a tendency to fall backwards. Her husband provides most of the history. She lives with her husband, who is the main caretaker. They have 2 grown children, both local. Her daughter is more involved in her care. They have an aide 3/week for 2 hours each time.  She does not snore and has no apneas per husband. She has had memory loss.  I reviewed recent lab work from 12/05/2012 which showed a normal CBC, normal CMP, normal CK, ESR elevated at 65, TSH was normal. 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 She used to be able to use a walker and has been in the [facility] for the past year. She had seen a neurologist in the past. She has a bedside commode. She is on coumadin.   Her Past Medical History Is Significant  For: Past Medical History  Diagnosis Date  . NICM (nonischemic cardiomyopathy)     EF 35%; cath 2/12 no CAD  . Systolic CHF, chronic   . Severe mitral regurgitation     s/p complex mitral valve repair with cox maze + LAA clipping, removal of RV lead, and implantation of CRT-D in abdomen  . Cardiac arrest - ventricular fibrillation     in 1990s  . Atrial fibrillation     amiodarone  . HTN (hypertension)   . HLD (hyperlipidemia)   . Achalasia     s/p dilation  . Recurrent UTI   . Obesity   . Arthritis   . Anoxic brain injury 1996    s/p cardiac arrest     Her Past Surgical History Is Significant For: Past Surgical History  Procedure Laterality Date  . Defibrillator generator explantation and reimplantation; and  08/02/2001  . Device migration with anticipated pocket revision  10/29/2003  . 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 Attempted implantation of an implantable cardioverter-  12/30/2004  . Cardioversion  04/14/2010  . Median sternotomy  07/16/2010  . Mitral valve repair  07/16/2010  . Cox maze procedure (complete biatrial lesion set with clipping of  07/16/2010  . Removal of old endocardial right ventricular defibrillator lead.  07/16/2010  . Placement of dual chamber pacemaker and implantable cardiac  07/16/2010  . Placement of swan ganz pulmonary artery catheter via left femoral access.  07/16/2010    Her Family History Is Significant For: Family History  Problem  Relation Age of Onset  . Stroke Mother   . Lung cancer Father     Her Social History Is Significant For: History   Social History  . Marital Status: Married    Spouse Name: Homero Fellers    Number of Children: 2  . Years of Education: college   Occupational History  . Retired    Social History Main Topics  . Smoking status: Never Smoker   . Smokeless tobacco: Never Used  . Alcohol Use: No  . Drug Use: No  . Sexual Activity: None   Other Topics Concern  .  None   Social History Narrative   Patient lives with spouse.   Caffeine Use: 1 soda daily    Her Allergies Are:  No Known Allergies:   Her Current Medications Are:  Outpatient Encounter Prescriptions as of 12/31/2012  Medication Sig Dispense Refill  . amiodarone (PACERONE) 200 MG tablet Take 200 mg by mouth 2 (two) times daily.      . calcium carbonate (TUMS - DOSED IN MG ELEMENTAL CALCIUM) 500 MG chewable tablet Chew 2 tablets by mouth every morning. Flavored TUMS      . CLARINEX 5 MG tablet Take 1 tablet by mouth daily.      Marland Kitchen DETROL LA 4 MG 24 hr capsule Take 1 tablet by mouth daily.      . fluticasone (FLONASE) 50 MCG/ACT nasal spray Place 1 drop into the nose at bedtime.       . folic acid (FOLVITE) 1 MG tablet Take 1 tablet by mouth daily.      . furosemide (LASIX) 80 MG tablet Take 0.5 tablets (40 mg total) by mouth daily.      Marland Kitchen KLOR-CON M20 20 MEQ tablet Take 1 tablet by mouth daily.      Marland Kitchen lisinopril (PRINIVIL,ZESTRIL) 20 MG tablet Take 20 mg by mouth daily.      Marland Kitchen lovastatin (MEVACOR) 40 MG tablet Take 1 tablet by mouth daily.      . metoprolol tartrate (LOPRESSOR) 25 MG tablet Take 12.5 mg by mouth Twice daily.       . Multiple Vitamin (MULTIVITAMIN WITH MINERALS) TABS Take 1 tablet by mouth daily.      . ondansetron (ZOFRAN) 4 MG tablet Take 1 tablet (4 mg total) by mouth every 8 (eight) hours as needed for nausea.  10 tablet  0  . Probiotic Product (ALIGN PO) Take 1 tablet by mouth daily.      Marland Kitchen warfarin (COUMADIN) 1 MG tablet Take 1-2 mg by mouth See admin instructions. Takes 2mg  on Sat & Sun Takes 1mg  Mon-Fri       No facility-administered encounter medications on file as of 12/31/2012.  : Review of Systems  Cardiovascular: Positive for leg swelling.  Gastrointestinal: Positive for diarrhea.       Incontinence  Hematological: Bruises/bleeds easily.    Objective:  Neurologic Exam  Physical Exam Physical Examination:   Filed Vitals:   12/31/12 1318  BP: 140/79   Pulse: 70  Temp: 97.9 F (36.6 C)    General Examination: The patient is a very pleasant 72 y.o. female in no acute distress. She is situated in her WC.  HEENT: Normocephalic, atraumatic, pupils are equal, round and reactive to light and accommodation. Extraocular tracking shows mild saccadic breakdown without nystagmus noted. There is limitation to upper gaze. There is no decrease in eye blink rate. Hearing is intact. Tympanic membranes are clear bilaterally. Face is symmetric with no facial masking  and normal facial sensation. There is no lip, neck or jaw tremor. Neck is not rigid with intact passive ROM. There are no carotid bruits on auscultation. Oropharynx exam reveals mild mouth dryness. No significant airway crowding is noted. Mallampati is class II. Tongue protrudes centrally and palate elevates symmetrically.   There is no drooling.   Chest: is clear to auscultation without wheezing, rhonchi or crackles noted.  Heart: sounds are regular and normal without murmurs, rubs or gallops noted.   Abdomen: is soft, non-tender and non-distended with normal bowel sounds appreciated on auscultation.  Extremities: There is 4+ pitting edema in the distal lower extremities bilaterally; her R leg is weeping fluid. Pedal pulses are not palpable. Chronic stasis-like changes are noted in the distal legs bilaterally. There are no varicose veins.  Skin: is warm and dry with no trophic changes noted. Age-related changes are noted on the skin and there are multiple bruises across her forearms, of varying ages.   Musculoskeletal: exam reveals no obvious joint deformities, tenderness, erythema, but she does have bilateral knee swelling.  Neurologically:  Mental status: The patient is awake and alert, paying good  attention. She is able to partially provide the history. Her husband provides details. She is oriented to: person, time/date, situation, day of week, month of year and year. Her memory, attention,  language and knowledge are mildly impaired. There is no aphasia, agnosia, apraxia or anomia. There is a mild degree of bradyphrenia. Speech is not hypophonic with no dysarthria noted. Mood is congruent and affect is normal.  Cranial nerves are as described above under HEENT exam. In addition, shoulder shrug is normal with equal shoulder height noted.  Motor exam: Normal bulk, and strength for age is noted. There are no dyskinesias noted.   Tone is not rigid with absence of cogwheeling. There is overall mild bradykinesia. There is no drift or rebound.  There is no resting tremor and she has mild bilateral upper chimney action tremors. Strength is about 4+ in the upper extremities and 4 minus in both lower extremities. Romberg is not test.  Reflexes are trace in the upper extremities and absent in the lower extremities.  Fine motor skills exam: Fine motor skills are mildly impaired in the upper and lower extremities. Cerebellar testing shows no dysmetria or intention tremor on finger to nose testing. Heel to shin is not possible. There is no truncal ataxia.   Sensory exam is intact to light touch, pinprick, vibration, temperature sense in the upper extremities, but she does have decrease in vibration sense in the distal lower extremities, perhaps mostly due to the swelling.   Gait, station and balance: She is unable to stand on her own. She requires more consistent by her husband. He pulse her up with both hands. She is insecure when standing and stands wide-based. She takes a few steps with a significant difficulty placing her feet. She walks wide-based. She is insecure. Her balance is significantly impaired. She has a tendency to fall backwards.  Assessment and Plan:   In summary, DAYJA LOVERIDGE is a very pleasant 72 y.o.-year old female with a complicated medical history and significant vascular risk factors with a history and physical exam consistent with a multifactorial gait disorder, in  particular secondary to atherosclerotic changes of her brain and significant lower extremity swelling, obesity, and peripheral vascular disease most likely as well. She has an underlying prior history of anoxic brain injury and as her vascular disease progressed she probably had less and  less brain reserve and at some point decompensated with her gait. She is very unstable but unfortunately there is probably not a whole lot I can do for her at this point. She has been wheelchair bound essentially for the past year and she certainly not able to walk even with maximum assistance without risk of fall. She is at significant risk for injury with fall and being on Coumadin she is in particular at risk for head injury and intracranial hemorrhage. I explained all this to her and her husband in detail today. At this juncture I have explained to them that secondary prevention of vascular disease risk factors and optimizing risk factor profile is probably the best they can do. I did not suggest any additional testing from my end of things at this point. I have asked him to followup with you regularly and her heart doctor as well as her other specialists as previously scheduled. I will be happy to see her back on an as-needed basis.  Thank you very much for allowing me to participate in the care of this nice patient. If I can be of any further assistance to you please do not hesitate to call me at [phone].  Sincerely,   Huston Foley, MD, PhD

## 2012-12-31 NOTE — Patient Instructions (Signed)
I am not sure that there is additional testing we can do. Your gait disorder is likely multifactorial in nature.

## 2013-03-02 ENCOUNTER — Encounter (HOSPITAL_COMMUNITY): Payer: Self-pay | Admitting: Emergency Medicine

## 2013-03-02 ENCOUNTER — Emergency Department (HOSPITAL_COMMUNITY)
Admission: EM | Admit: 2013-03-02 | Discharge: 2013-03-03 | Disposition: A | Discharge status: Home / Self Care | Payer: Medicare Other | Attending: Emergency Medicine | Admitting: Emergency Medicine

## 2013-03-02 ENCOUNTER — Emergency Department (HOSPITAL_COMMUNITY): Payer: Medicare Other

## 2013-03-02 DIAGNOSIS — I10 Essential (primary) hypertension: Secondary | ICD-10-CM | POA: Insufficient documentation

## 2013-03-02 DIAGNOSIS — I4891 Unspecified atrial fibrillation: Secondary | ICD-10-CM | POA: Insufficient documentation

## 2013-03-02 DIAGNOSIS — N390 Urinary tract infection, site not specified: Secondary | ICD-10-CM | POA: Insufficient documentation

## 2013-03-02 DIAGNOSIS — Z8739 Personal history of other diseases of the musculoskeletal system and connective tissue: Secondary | ICD-10-CM | POA: Insufficient documentation

## 2013-03-02 DIAGNOSIS — I2510 Atherosclerotic heart disease of native coronary artery without angina pectoris: Secondary | ICD-10-CM | POA: Insufficient documentation

## 2013-03-02 DIAGNOSIS — R059 Cough, unspecified: Secondary | ICD-10-CM | POA: Insufficient documentation

## 2013-03-02 DIAGNOSIS — E669 Obesity, unspecified: Secondary | ICD-10-CM | POA: Insufficient documentation

## 2013-03-02 DIAGNOSIS — Z950 Presence of cardiac pacemaker: Secondary | ICD-10-CM | POA: Insufficient documentation

## 2013-03-02 DIAGNOSIS — Z7901 Long term (current) use of anticoagulants: Secondary | ICD-10-CM | POA: Insufficient documentation

## 2013-03-02 DIAGNOSIS — Z8674 Personal history of sudden cardiac arrest: Secondary | ICD-10-CM | POA: Insufficient documentation

## 2013-03-02 DIAGNOSIS — Z79899 Other long term (current) drug therapy: Secondary | ICD-10-CM | POA: Insufficient documentation

## 2013-03-02 DIAGNOSIS — I509 Heart failure, unspecified: Secondary | ICD-10-CM

## 2013-03-02 DIAGNOSIS — Z95810 Presence of automatic (implantable) cardiac defibrillator: Secondary | ICD-10-CM | POA: Insufficient documentation

## 2013-03-02 DIAGNOSIS — I5022 Chronic systolic (congestive) heart failure: Secondary | ICD-10-CM | POA: Insufficient documentation

## 2013-03-02 DIAGNOSIS — Z87828 Personal history of other (healed) physical injury and trauma: Secondary | ICD-10-CM | POA: Insufficient documentation

## 2013-03-02 DIAGNOSIS — E785 Hyperlipidemia, unspecified: Secondary | ICD-10-CM | POA: Insufficient documentation

## 2013-03-02 DIAGNOSIS — R609 Edema, unspecified: Secondary | ICD-10-CM | POA: Insufficient documentation

## 2013-03-02 DIAGNOSIS — R600 Localized edema: Secondary | ICD-10-CM

## 2013-03-02 DIAGNOSIS — R05 Cough: Secondary | ICD-10-CM | POA: Insufficient documentation

## 2013-03-02 DIAGNOSIS — Z8719 Personal history of other diseases of the digestive system: Secondary | ICD-10-CM | POA: Insufficient documentation

## 2013-03-02 LAB — CBC WITH DIFFERENTIAL/PLATELET
Hemoglobin: 11 g/dL — ABNORMAL LOW (ref 12.0–15.0)
Lymphs Abs: 1.2 10*3/uL (ref 0.7–4.0)
Monocytes Relative: 12 % (ref 3–12)
Neutro Abs: 3 10*3/uL (ref 1.7–7.7)
Neutrophils Relative %: 60 % (ref 43–77)
RBC: 3.49 MIL/uL — ABNORMAL LOW (ref 3.87–5.11)

## 2013-03-02 LAB — BASIC METABOLIC PANEL
CO2: 28 mEq/L (ref 19–32)
GFR calc non Af Amer: 24 mL/min — ABNORMAL LOW (ref >90)
Glucose, Bld: 114 mg/dL — ABNORMAL HIGH (ref 70–99)
Potassium: 3.7 mEq/L (ref 3.5–5.1)
Sodium: 138 mEq/L (ref 135–145)

## 2013-03-02 NOTE — ED Provider Notes (Signed)
CSN: [id number]     Arrival date & time 03/02/13  2139 History   First MD Initiated Contact with Patient 03/02/13 2210     Chief Complaint  Patient presents with  . Leg Swelling   HPI  History provided by the patient and family. Patient is a 72 year old female with history of hypertension, hyperlipidemia, CAD, CHF who presents with concerns for worsened lower extremity swelling. Patient at baseline does not ambulate much except for transferring from wheelchair to toilet and bed. Over the past week or more she has had increasing swelling in her legs. His has been associated with some weeping. She denies any pain to the extremities. She has not had any increased shortness of breath. Denies any difficulty with sleeping or night awakenings. She has been taking Lasix 80 mg twice a day which was increased last month by Dr. Donnie Aho. Her daughter states that she has had some occasional episodes recently of increased weakness with transferring which usually occurs later in the afternoon evening. She does much better in the mornings after waking up from sleep.   Past Medical History  Diagnosis Date  . NICM (nonischemic cardiomyopathy)     EF 35%; cath 2/12 no CAD  . Systolic CHF, chronic   . Severe mitral regurgitation     s/p complex mitral valve repair with cox maze + LAA clipping, removal of RV lead, and implantation of CRT-D in abdomen  . Cardiac arrest - ventricular fibrillation     in 1990s  . Atrial fibrillation     amiodarone  . HTN (hypertension)   . HLD (hyperlipidemia)   . Achalasia     s/p dilation  . Recurrent UTI   . Obesity   . Arthritis   . Anoxic brain injury 1996    s/p cardiac arrest    Past Surgical History  Procedure Laterality Date  . Defibrillator generator explantation and reimplantation; and  08/02/2001  . Device migration with anticipated pocket revision  10/29/2003  . 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 Attempted  implantation of an implantable cardioverter-  12/30/2004  . Cardioversion  04/14/2010  . Median sternotomy  07/16/2010  . Mitral valve repair  07/16/2010  . Cox maze procedure (complete biatrial lesion set with clipping of  07/16/2010  . Removal of old endocardial right ventricular defibrillator lead.  07/16/2010  . Placement of dual chamber pacemaker and implantable cardiac  07/16/2010  . Placement of swan ganz pulmonary artery catheter via left femoral access.  07/16/2010   Family History  Problem Relation Age of Onset  . Stroke Mother   . Lung cancer Father    History  Substance Use Topics  . Smoking status: Never Smoker   . Smokeless tobacco: Never Used  . Alcohol Use: No   OB History   Grav Para Term Preterm Abortions TAB SAB Ect Mult Living                 Review of Systems  Constitutional: Negative for fever.  Respiratory: Positive for cough. Negative for shortness of breath and wheezing.   Cardiovascular: Positive for leg swelling. Negative for chest pain and palpitations.  Gastrointestinal: Negative for nausea, vomiting and abdominal pain.  Genitourinary: Negative for dysuria, frequency, flank pain and decreased urine volume.  All other systems reviewed and are negative.    Allergies  Review of patient's allergies indicates no known allergies.  Home Medications   Current Outpatient Rx  Name  Route  Sig  Dispense  Refill  . amiodarone (PACERONE) 200 MG tablet   Oral   Take 200 mg by mouth daily.          . calcium carbonate (TUMS - DOSED IN MG ELEMENTAL CALCIUM) 500 MG chewable tablet   Oral   Chew 2 tablets by mouth daily. Flavored TUMS         . desloratadine (CLARINEX) 5 MG tablet   Oral   Take 5 mg by mouth daily.         . fluticasone (FLONASE) 50 MCG/ACT nasal spray   Each Nare   Place 1 drop into both nostrils at bedtime.          . folic acid (FOLVITE) 1 MG tablet   Oral   Take 1 tablet by mouth daily.         . furosemide  (LASIX) 80 MG tablet   Oral   Take 80 mg by mouth 2 (two) times daily.         Marland Kitchen levothyroxine (SYNTHROID, LEVOTHROID) 50 MCG tablet   Oral   Take 50 mcg by mouth daily before breakfast.         . lisinopril (PRINIVIL,ZESTRIL) 20 MG tablet   Oral   Take 20 mg by mouth daily.         Marland Kitchen lovastatin (MEVACOR) 40 MG tablet   Oral   Take 40 mg by mouth daily.          . metoprolol tartrate (LOPRESSOR) 25 MG tablet   Oral   Take 12.5 mg by mouth Twice daily.          Marland Kitchen OVER THE COUNTER MEDICATION   Oral   Take 1 capsule by mouth at bedtime. Equate sleep aid (blue capsule)         . potassium chloride SA (K-DUR,KLOR-CON) 20 MEQ tablet   Oral   Take 20 mEq by mouth daily.         . Probiotic Product (ALIGN PO)   Oral   Take 1 tablet by mouth daily.         Marland Kitchen warfarin (COUMADIN) 1 MG tablet   Oral   Take 2 mg by mouth every evening.           BP 139/61  Pulse 71  Temp(Src) 98.1 F (36.7 C) (Oral)  Resp 17  SpO2 100% Physical Exam  Nursing note and vitals reviewed. Constitutional: She is oriented to person, place, and time. She appears well-developed and well-nourished. No distress.  HENT:  Head: Normocephalic.  Neck: Normal range of motion. Neck supple. JVD present.  Cardiovascular: Normal rate and regular rhythm.   Pulmonary/Chest: Effort normal and breath sounds normal. No respiratory distress. She has no wheezes. She has no rales.  Abdominal: Soft. There is no tenderness. There is no rebound.  Musculoskeletal: She exhibits edema. She exhibits no tenderness.  There is significant pitting edema with some weeping bilateral lower legs. There are a few old healing small ulcerations of the skin. Slight erythema to the right extremity around some of these ulcerations. No tenderness. Normal baseline sensations to light touch.  Neurological: She is alert and oriented to person, place, and time.  Skin: Skin is warm and dry. No rash noted.  Psychiatric: She has  a normal mood and affect. Her behavior is normal.    ED Course  Procedures    DIAGNOSTIC STUDIES: Oxygen Saturation is 99% on room air.    COORDINATION  OF CARE:  Nursing notes reviewed. Vital signs reviewed. Initial pt interview and examination performed.   10:32 PM-patient seen and evaluated. She appears well in no acute distress. Denies any shortness of breath or chest pain symptoms. Discussed work up plan with pt and family at bedside, which includes not testing, chest x-ray and the EKG. Pt agrees with plan.   Discussed findings for lab testing with patient and family. No concerning findings today aside from concerns for UTI. Patient with no fluid on lungs a chest x-ray. She has not had any respiratory complaints at all. I did have a very long and lengthy discussion concerning her chronic symptoms of swelling. Family did express concerns and interest in knowing when to come to the emergency department for worsened symptoms. This was also explained by the attending physician who evaluated the patient and lab findings. Family and patient felt much better with further explanation and counseling. Plan will be to treat UTI and recommended an extra dose of her Lasix when returning home. Patient will followup with Dr. Donnie Aho on Monday    Results for orders placed during the hospital encounter of 03/02/13  CBC WITH DIFFERENTIAL      Result Value Range   WBC 4.9  4.0 - 10.5 K/uL   RBC 3.49 (*) 3.87 - 5.11 MIL/uL   Hemoglobin 11.0 (*) 12.0 - 15.0 g/dL   HCT 95.6 (*) 21.3 - 08.6 %   MCV 98.6  78.0 - 100.0 fL   MCH 31.5  26.0 - 34.0 pg   MCHC 32.0  30.0 - 36.0 g/dL   RDW 57.8  46.9 - 62.9 %   Platelets 228  150 - 400 K/uL   Neutrophils Relative % 60  43 - 77 %   Neutro Abs 3.0  1.7 - 7.7 K/uL   Lymphocytes Relative 25  12 - 46 %   Lymphs Abs 1.2  0.7 - 4.0 K/uL   Monocytes Relative 12  3 - 12 %   Monocytes Absolute 0.6  0.1 - 1.0 K/uL   Eosinophils Relative 2  0 - 5 %   Eosinophils  Absolute 0.1  0.0 - 0.7 K/uL   Basophils Relative 0  0 - 1 %   Basophils Absolute 0.0  0.0 - 0.1 K/uL  PRO B NATRIURETIC PEPTIDE      Result Value Range   Pro B Natriuretic peptide (BNP) 1436.0 (*) 0 - 125 pg/mL  BASIC METABOLIC PANEL      Result Value Range   Sodium 138  135 - 145 mEq/L   Potassium 3.7  3.5 - 5.1 mEq/L   Chloride 97  96 - 112 mEq/L   CO2 28  19 - 32 mEq/L   Glucose, Bld 114 (*) 70 - 99 mg/dL   BUN 26 (*) 6 - 23 mg/dL   Creatinine, Ser 5.28 (*) 0.50 - 1.10 mg/dL   Calcium 9.2  8.4 - 41.3 mg/dL   GFR calc non Af Amer 24 (*) >90 mL/min   GFR calc Af Amer 28 (*) >90 mL/min  URINALYSIS, ROUTINE W REFLEX MICROSCOPIC      Result Value Range   Color, Urine YELLOW  YELLOW   APPearance CLOUDY (*) CLEAR   Specific Gravity, Urine 1.009  1.005 - 1.030   pH 6.5  5.0 - 8.0   Glucose, UA NEGATIVE  NEGATIVE mg/dL   Hgb urine dipstick SMALL (*) NEGATIVE   Bilirubin Urine NEGATIVE  NEGATIVE   Ketones, ur NEGATIVE  NEGATIVE mg/dL  Protein, ur NEGATIVE  NEGATIVE mg/dL   Urobilinogen, UA 1.0  0.0 - 1.0 mg/dL   Nitrite POSITIVE (*) NEGATIVE   Leukocytes, UA LARGE (*) NEGATIVE  URINE MICROSCOPIC-ADD ON      Result Value Range   WBC, UA 21-50  <3 WBC/hpf   RBC / HPF 0-2  <3 RBC/hpf   Bacteria, UA FEW (*) RARE   Urine-Other MUCOUS PRESENT        Imaging Review Dg Chest 2 View  03/02/2013   CLINICAL DATA:  Arm swelling  EXAM: CHEST  2 VIEW  COMPARISON:  Prior radiograph from 06/09/2012  FINDINGS: Median sternotomy wires with underlying hardware is unchanged. Cardiomegaly is stable.  Lungs are normally inflated. There is no overt pulmonary edema. No focal infiltrate. No pneumothorax. No pleural effusion is identified.  Multilevel degenerative changes are seen within the visualized spine with accentuation of the normal thoracic kyphosis. No acute osseous abnormality.  IMPRESSION: No active cardiopulmonary disease.   Electronically Signed   By: Rise Mu M.D.   On:  03/02/2013 23:27    EKG Interpretation     Ventricular Rate:  71 PR Interval:    QRS Duration: 129 QT Interval:  464 QTC Calculation: 504 R Axis:   0 Text Interpretation:  Accelerated junctional rhythm Nonspecific intraventricular conduction delay Abnormal T, consider ischemia, lateral leads Nonspecific ST changes,             MDM   1. Bilateral lower extremity edema   2. CHF (congestive heart failure)   3. UTI (lower urinary tract infection)        Angus Seller, PA-C 03/03/13 2227

## 2013-03-02 NOTE — ED Notes (Signed)
Patient presents to ED via [facility]. Pt daughter felt that the patients legs "were more swollen than normal." Pt husband called patients PCP and PCP told her to come into the office on Monday, pt daughter felt that patient should come tonight. Pt feels that are legs are no more swollen "than normal." Pedal edema noted to both lower legs. Pt states that recently her cardiologist changed her lasix from 40mg  twice a day to 80mg  twice a day. Denies any pain. A&Ox4 upon arrival to ED.

## 2013-03-03 LAB — URINALYSIS, ROUTINE W REFLEX MICROSCOPIC
Glucose, UA: NEGATIVE mg/dL
Specific Gravity, Urine: 1.009 (ref 1.005–1.030)
Urobilinogen, UA: 1 mg/dL (ref 0.0–1.0)

## 2013-03-03 LAB — URINE MICROSCOPIC-ADD ON

## 2013-03-03 MED ORDER — CEPHALEXIN 500 MG PO CAPS: 500.0000 mg | ORAL_CAPSULE | Freq: Four times a day (QID) | ORAL | Status: DC

## 2013-03-03 MED ORDER — CEPHALEXIN 250 MG PO CAPS
500.0000 mg | ORAL_CAPSULE | Freq: Once | ORAL | Status: AC
  Administered 2013-03-03: 500 mg via ORAL
  Filled 2013-03-03: qty 2

## 2013-03-03 NOTE — ED Notes (Signed)
Margaret [name]. At bedside.

## 2013-03-04 NOTE — ED Provider Notes (Signed)
Elderly woman with multiple chronic medical problems from home at request of daughter for evaluation of worsening peripheral edema. No signs or sx of pulmonary edema. The patient has a history of cardiomypathy and CKD, along with multiple co-morbidities. Work up is in ED unremarkable. Counseled patient and dtr re: plan to treat with single dose of lasix in the ED and discharge to home with plan for close f/u with Dr. Donnie Aho to discuss worsening anasarca and possible medication changes.   Medical screening examination/treatment/procedure(s) were performed by non-physician practitioner and as supervising physician I was immediately available for consultation/collaboration.  EKG Interpretation     Ventricular Rate:  71 PR Interval:    QRS Duration: 129 QT Interval:  464 QTC Calculation: 504 R Axis:   0 Text Interpretation:  Accelerated junctional rhythm Nonspecific intraventricular conduction delay Abnormal T, consider ischemia, lateral leads Nonspecific ST changes,               Brandt Loosen, MD 03/04/13 0157

## 2013-03-05 LAB — URINE CULTURE

## 2013-03-06 ENCOUNTER — Telehealth (HOSPITAL_COMMUNITY): Payer: Self-pay | Admitting: Emergency Medicine

## 2013-03-06 NOTE — ED Notes (Signed)
Post ED Visit - Positive Culture Follow-up ° °Culture report reviewed by antimicrobial stewardship pharmacist: °[] Wes Dulaney, Pharm.D., BCPS °[] Jeremy Frens, Pharm.D., BCPS °[] Elizabeth Martin, Pharm.D., BCPS °[] Minh Pham, Pharm.D., BCPS, AAHIVP °[x] Michelle Turner, Pharm.D., BCPS, AAHIVP ° °Positive urine culture °Treated with Keflex, organism sensitive to the same and no further patient follow-up is required at this time. ° °Margaret [name] °03/06/2013, 10:50 AM ° ° °

## 2013-03-30 ENCOUNTER — Inpatient Hospital Stay (HOSPITAL_COMMUNITY)
Admission: EM | Admit: 2013-03-30 | Discharge: 2013-04-02 | DRG: 308 | Disposition: A | Discharge status: Home Health | Payer: Medicare Other | Attending: Internal Medicine | Admitting: Internal Medicine

## 2013-03-30 ENCOUNTER — Emergency Department (HOSPITAL_COMMUNITY): Payer: Medicare Other

## 2013-03-30 ENCOUNTER — Encounter (HOSPITAL_COMMUNITY): Payer: Self-pay | Admitting: Emergency Medicine

## 2013-03-30 DIAGNOSIS — I5023 Acute on chronic systolic (congestive) heart failure: Secondary | ICD-10-CM | POA: Diagnosis present

## 2013-03-30 DIAGNOSIS — E669 Obesity, unspecified: Secondary | ICD-10-CM | POA: Diagnosis present

## 2013-03-30 DIAGNOSIS — Z8679 Personal history of other diseases of the circulatory system: Secondary | ICD-10-CM

## 2013-03-30 DIAGNOSIS — I472 Ventricular tachycardia, unspecified: Principal | ICD-10-CM | POA: Diagnosis present

## 2013-03-30 DIAGNOSIS — I428 Other cardiomyopathies: Secondary | ICD-10-CM | POA: Diagnosis present

## 2013-03-30 DIAGNOSIS — Z833 Family history of diabetes mellitus: Secondary | ICD-10-CM

## 2013-03-30 DIAGNOSIS — Z79899 Other long term (current) drug therapy: Secondary | ICD-10-CM

## 2013-03-30 DIAGNOSIS — N390 Urinary tract infection, site not specified: Secondary | ICD-10-CM

## 2013-03-30 DIAGNOSIS — R55 Syncope and collapse: Secondary | ICD-10-CM | POA: Diagnosis present

## 2013-03-30 DIAGNOSIS — I440 Atrioventricular block, first degree: Secondary | ICD-10-CM | POA: Diagnosis present

## 2013-03-30 DIAGNOSIS — N183 Chronic kidney disease, stage 3 unspecified: Secondary | ICD-10-CM | POA: Diagnosis present

## 2013-03-30 DIAGNOSIS — G931 Anoxic brain damage, not elsewhere classified: Secondary | ICD-10-CM

## 2013-03-30 DIAGNOSIS — Z823 Family history of stroke: Secondary | ICD-10-CM

## 2013-03-30 DIAGNOSIS — E785 Hyperlipidemia, unspecified: Secondary | ICD-10-CM | POA: Diagnosis present

## 2013-03-30 DIAGNOSIS — I4901 Ventricular fibrillation: Secondary | ICD-10-CM | POA: Diagnosis present

## 2013-03-30 DIAGNOSIS — Z7401 Bed confinement status: Secondary | ICD-10-CM

## 2013-03-30 DIAGNOSIS — I059 Rheumatic mitral valve disease, unspecified: Secondary | ICD-10-CM | POA: Diagnosis present

## 2013-03-30 DIAGNOSIS — Z6838 Body mass index (BMI) 38.0-38.9, adult: Secondary | ICD-10-CM

## 2013-03-30 DIAGNOSIS — I129 Hypertensive chronic kidney disease with stage 1 through stage 4 chronic kidney disease, or unspecified chronic kidney disease: Secondary | ICD-10-CM | POA: Diagnosis present

## 2013-03-30 DIAGNOSIS — Z993 Dependence on wheelchair: Secondary | ICD-10-CM

## 2013-03-30 DIAGNOSIS — I4891 Unspecified atrial fibrillation: Secondary | ICD-10-CM | POA: Diagnosis present

## 2013-03-30 DIAGNOSIS — M1990 Unspecified osteoarthritis, unspecified site: Secondary | ICD-10-CM

## 2013-03-30 DIAGNOSIS — Z7901 Long term (current) use of anticoagulants: Secondary | ICD-10-CM

## 2013-03-30 DIAGNOSIS — Z95810 Presence of automatic (implantable) cardiac defibrillator: Secondary | ICD-10-CM | POA: Diagnosis present

## 2013-03-30 DIAGNOSIS — Z9889 Other specified postprocedural states: Secondary | ICD-10-CM

## 2013-03-30 DIAGNOSIS — I10 Essential (primary) hypertension: Secondary | ICD-10-CM

## 2013-03-30 DIAGNOSIS — E876 Hypokalemia: Secondary | ICD-10-CM | POA: Diagnosis present

## 2013-03-30 DIAGNOSIS — Z801 Family history of malignant neoplasm of trachea, bronchus and lung: Secondary | ICD-10-CM

## 2013-03-30 DIAGNOSIS — I509 Heart failure, unspecified: Secondary | ICD-10-CM | POA: Diagnosis present

## 2013-03-30 DIAGNOSIS — N184 Chronic kidney disease, stage 4 (severe): Secondary | ICD-10-CM | POA: Diagnosis present

## 2013-03-30 DIAGNOSIS — I4510 Unspecified right bundle-branch block: Secondary | ICD-10-CM | POA: Diagnosis present

## 2013-03-30 DIAGNOSIS — Z8673 Personal history of transient ischemic attack (TIA), and cerebral infarction without residual deficits: Secondary | ICD-10-CM

## 2013-03-30 DIAGNOSIS — I4729 Other ventricular tachycardia: Principal | ICD-10-CM | POA: Diagnosis present

## 2013-03-30 DIAGNOSIS — Z8674 Personal history of sudden cardiac arrest: Secondary | ICD-10-CM

## 2013-03-30 LAB — BASIC METABOLIC PANEL
BUN: 59 mg/dL — ABNORMAL HIGH (ref 6–23)
Calcium: 8.9 mg/dL (ref 8.4–10.5)
Chloride: 88 mEq/L — ABNORMAL LOW (ref 96–112)
GFR calc non Af Amer: 17 mL/min — ABNORMAL LOW (ref >90)
Glucose, Bld: 126 mg/dL — ABNORMAL HIGH (ref 70–99)
Potassium: 2.2 mEq/L — CL (ref 3.5–5.1)
Sodium: 139 mEq/L (ref 135–145)

## 2013-03-30 LAB — URINE MICROSCOPIC-ADD ON

## 2013-03-30 LAB — PRO B NATRIURETIC PEPTIDE: Pro B Natriuretic peptide (BNP): 1353 pg/mL — ABNORMAL HIGH (ref 0–125)

## 2013-03-30 LAB — CBC
Hemoglobin: 10.7 g/dL — ABNORMAL LOW (ref 12.0–15.0)
MCH: 30.9 pg (ref 26.0–34.0)
MCHC: 31.6 g/dL (ref 30.0–36.0)
Platelets: 232 10*3/uL (ref 150–400)
RBC: 3.46 MIL/uL — ABNORMAL LOW (ref 3.87–5.11)
WBC: 5.5 10*3/uL (ref 4.0–10.5)

## 2013-03-30 LAB — URINALYSIS, ROUTINE W REFLEX MICROSCOPIC
Bilirubin Urine: NEGATIVE
Glucose, UA: NEGATIVE mg/dL
Specific Gravity, Urine: 1.011 (ref 1.005–1.030)
Urobilinogen, UA: 0.2 mg/dL (ref 0.0–1.0)
pH: 6.5 (ref 5.0–8.0)

## 2013-03-30 LAB — POCT I-STAT TROPONIN I: Troponin i, poc: 0.02 ng/mL (ref 0.00–0.08)

## 2013-03-30 MED ORDER — LORATADINE 10 MG PO TABS
10.0000 mg | ORAL_TABLET | Freq: Every day | ORAL | Status: DC
  Administered 2013-03-31 – 2013-04-02 (×3): 10 mg via ORAL
  Filled 2013-03-30 (×3): qty 1

## 2013-03-30 MED ORDER — WARFARIN - PHARMACIST DOSING INPATIENT: Freq: Every day | Status: DC

## 2013-03-30 MED ORDER — ACETAMINOPHEN 325 MG PO TABS: 650.0000 mg | ORAL_TABLET | Freq: Four times a day (QID) | ORAL | Status: DC | PRN

## 2013-03-30 MED ORDER — POTASSIUM CHLORIDE CRYS ER 20 MEQ PO TBCR
40.0000 meq | EXTENDED_RELEASE_TABLET | Freq: Once | ORAL | Status: AC
  Administered 2013-03-30: 40 meq via ORAL
  Filled 2013-03-30: qty 2

## 2013-03-30 MED ORDER — WARFARIN SODIUM 2 MG PO TABS
2.0000 mg | ORAL_TABLET | Freq: Every day | ORAL | Status: DC
  Administered 2013-03-30 – 2013-04-01 (×3): 2 mg via ORAL
  Filled 2013-03-30 (×4): qty 1

## 2013-03-30 MED ORDER — SODIUM CHLORIDE 0.9 % IJ SOLN
3.0000 mL | Freq: Two times a day (BID) | INTRAMUSCULAR | Status: DC
  Administered 2013-03-31 – 2013-04-01 (×2): 3 mL via INTRAVENOUS

## 2013-03-30 MED ORDER — METOPROLOL TARTRATE 12.5 MG HALF TABLET
12.5000 mg | ORAL_TABLET | Freq: Two times a day (BID) | ORAL | Status: DC
  Administered 2013-03-30 – 2013-04-02 (×6): 12.5 mg via ORAL
  Filled 2013-03-30 (×7): qty 1

## 2013-03-30 MED ORDER — SODIUM CHLORIDE 0.9 % IV SOLN: 250.0000 mL | INTRAVENOUS | Status: DC | PRN

## 2013-03-30 MED ORDER — FUROSEMIDE 80 MG PO TABS
80.0000 mg | ORAL_TABLET | Freq: Two times a day (BID) | ORAL | Status: DC
  Administered 2013-03-31: 80 mg via ORAL
  Filled 2013-03-30 (×4): qty 1

## 2013-03-30 MED ORDER — SIMVASTATIN 20 MG PO TABS
20.0000 mg | ORAL_TABLET | Freq: Every day | ORAL | Status: DC
  Administered 2013-03-31 – 2013-04-01 (×2): 20 mg via ORAL
  Filled 2013-03-30 (×3): qty 1

## 2013-03-30 MED ORDER — SODIUM CHLORIDE 0.9 % IJ SOLN
3.0000 mL | Freq: Two times a day (BID) | INTRAMUSCULAR | Status: DC
  Administered 2013-04-01 – 2013-04-02 (×2): 3 mL via INTRAVENOUS

## 2013-03-30 MED ORDER — DOCUSATE SODIUM 100 MG PO CAPS
100.0000 mg | ORAL_CAPSULE | Freq: Two times a day (BID) | ORAL | Status: DC
  Administered 2013-03-30: 100 mg via ORAL
  Filled 2013-03-30 (×7): qty 1

## 2013-03-30 MED ORDER — LEVOTHYROXINE SODIUM 50 MCG PO TABS
50.0000 ug | ORAL_TABLET | Freq: Every day | ORAL | Status: DC
  Administered 2013-03-31 – 2013-04-02 (×3): 50 ug via ORAL
  Filled 2013-03-30 (×4): qty 1

## 2013-03-30 MED ORDER — ACETAMINOPHEN 650 MG RE SUPP: 650.0000 mg | Freq: Four times a day (QID) | RECTAL | Status: DC | PRN

## 2013-03-30 MED ORDER — HYDROCODONE-ACETAMINOPHEN 5-325 MG PO TABS: 1.0000 | ORAL_TABLET | ORAL | Status: DC | PRN

## 2013-03-30 MED ORDER — SODIUM CHLORIDE 0.9 % IV SOLN
Freq: Once | INTRAVENOUS | Status: AC
  Administered 2013-03-30: 20:00:00 via INTRAVENOUS

## 2013-03-30 MED ORDER — SODIUM CHLORIDE 0.9 % IJ SOLN: 3.0000 mL | INTRAMUSCULAR | Status: DC | PRN

## 2013-03-30 MED ORDER — AMIODARONE HCL 200 MG PO TABS
200.0000 mg | ORAL_TABLET | Freq: Every day | ORAL | Status: DC
  Administered 2013-03-31 – 2013-04-02 (×3): 200 mg via ORAL
  Filled 2013-03-30 (×3): qty 1

## 2013-03-30 MED ORDER — ONDANSETRON HCL 4 MG PO TABS: 4.0000 mg | ORAL_TABLET | Freq: Four times a day (QID) | ORAL | Status: DC | PRN

## 2013-03-30 MED ORDER — POTASSIUM CHLORIDE CRYS ER 20 MEQ PO TBCR
20.0000 meq | EXTENDED_RELEASE_TABLET | Freq: Two times a day (BID) | ORAL | Status: DC
  Administered 2013-03-30 – 2013-03-31 (×2): 20 meq via ORAL
  Filled 2013-03-30 (×4): qty 1

## 2013-03-30 MED ORDER — POTASSIUM CHLORIDE 10 MEQ/100ML IV SOLN
10.0000 meq | INTRAVENOUS | Status: AC
  Administered 2013-03-30 – 2013-03-31 (×5): 10 meq via INTRAVENOUS
  Filled 2013-03-30 (×6): qty 100

## 2013-03-30 MED ORDER — ONDANSETRON HCL 4 MG/2ML IJ SOLN: 4.0000 mg | Freq: Four times a day (QID) | INTRAMUSCULAR | Status: DC | PRN

## 2013-03-30 NOTE — ED Provider Notes (Signed)
CSN: [id number]     Arrival date & time 03/30/13  1753 History   First MD Initiated Contact with Patient 03/30/13 1809     Chief Complaint  Patient presents with  . AICD Problem   (Consider location/radiation/quality/duration/timing/severity/associated sxs/prior Treatment) HPI Patient presents of an episode of unresponsiveness, with possible AICD firing. Patient is awake and alert, states that she feels essentially back to her usual state of health currently. Any hour prior to ED arrival the patient had one episode of minimal responsiveness, subsequently had a profound shock like reaction.  That cycle repeated one additional time, then the patient returned to normal interactivity. Patient does complain of mild nausea, states that she has no complaints on medication recently.  She is notable recent history of significant fluid overload status, resolved in the past week after increase in medication dosing. She denies current fever, chills, vomiting, diarrhea, confusion, disorientation. Patient has multiple medical problems, including CHF, prior anoxic brain injury, cardiomyopathy Past Medical History  Diagnosis Date  . NICM (nonischemic cardiomyopathy)     EF 35%; cath 2/12 no CAD  . Systolic CHF, chronic   . Severe mitral regurgitation     s/p complex mitral valve repair with cox maze + LAA clipping, removal of RV lead, and implantation of CRT-D in abdomen  . Cardiac arrest - ventricular fibrillation     in 1990s  . Atrial fibrillation     amiodarone  . HTN (hypertension)   . HLD (hyperlipidemia)   . Achalasia     s/p dilation  . Recurrent UTI   . Obesity   . Arthritis   . Anoxic brain injury 1996    s/p cardiac arrest    Past Surgical History  Procedure Laterality Date  . Defibrillator generator explantation and reimplantation; and  08/02/2001  . Device migration with anticipated pocket revision  10/29/2003  . 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 Attempted implantation of an implantable cardioverter-  12/30/2004  . Cardioversion  04/14/2010  . Median sternotomy  07/16/2010  . Mitral valve repair  07/16/2010  . Cox maze procedure (complete biatrial lesion set with clipping of  07/16/2010  . Removal of old endocardial right ventricular defibrillator lead.  07/16/2010  . Placement of dual chamber pacemaker and implantable cardiac  07/16/2010  . Placement of swan ganz pulmonary artery catheter via left femoral access.  07/16/2010   Family History  Problem Relation Age of Onset  . Stroke Mother   . Lung cancer Father    History  Substance Use Topics  . Smoking status: Never Smoker   . Smokeless tobacco: Never Used  . Alcohol Use: No   OB History   Grav Para Term Preterm Abortions TAB SAB Ect Mult Living                 Review of Systems  Constitutional:       Per HPI, otherwise negative  HENT:       Per HPI, otherwise negative  Respiratory:       Per HPI, otherwise negative  Cardiovascular:       Per HPI, otherwise negative  Gastrointestinal: Negative for vomiting.  Endocrine:       Negative aside from HPI  Genitourinary:       Neg aside from HPI   Musculoskeletal:       Per HPI, otherwise negative  Skin: Negative.   Neurological: Negative for syncope.    Allergies  Review of patient's allergies indicates no known allergies.  Home Medications   Current Outpatient Rx  Name  Route  Sig  Dispense  Refill  . amiodarone (PACERONE) 200 MG tablet   Oral   Take 200 mg by mouth daily.          . calcium carbonate (TUMS - DOSED IN MG ELEMENTAL CALCIUM) 500 MG chewable tablet   Oral   Chew 2 tablets by mouth daily. Flavored TUMS         . desloratadine (CLARINEX) 5 MG tablet   Oral   Take 5 mg by mouth daily.         . fluticasone (FLONASE) 50 MCG/ACT nasal spray   Each Nare   Place 1 drop into both nostrils at bedtime.          . folic acid (FOLVITE) 1 MG tablet   Oral   Take 1  tablet by mouth daily.         . furosemide (LASIX) 80 MG tablet   Oral   Take 80 mg by mouth 2 (two) times daily.         Marland Kitchen levothyroxine (SYNTHROID, LEVOTHROID) 50 MCG tablet   Oral   Take 50 mcg by mouth daily before breakfast.         . lovastatin (MEVACOR) 40 MG tablet   Oral   Take 40 mg by mouth daily.          . metoprolol tartrate (LOPRESSOR) 25 MG tablet   Oral   Take 12.5 mg by mouth Twice daily.          Marland Kitchen OVER THE COUNTER MEDICATION   Oral   Take 1 capsule by mouth at bedtime. Equate sleep aid (blue capsule)         . potassium chloride SA (K-DUR,KLOR-CON) 20 MEQ tablet   Oral   Take 20 mEq by mouth daily.         . Probiotic Product (ALIGN PO)   Oral   Take 1 tablet by mouth daily.         Marland Kitchen warfarin (COUMADIN) 1 MG tablet   Oral   Take 2 mg by mouth every evening.           BP 114/44  Pulse 71  Temp(Src) 97.9 F (36.6 C) (Oral)  Resp 20  Ht 5\' 5"  (1.651 m)  Wt 207 lb 3 oz (93.98 kg)  BMI 34.48 kg/m2  SpO2 94% Physical Exam  Nursing note and vitals reviewed. Constitutional: She is oriented to person, place, and time. She appears well-developed and well-nourished. No distress.  HENT:  Head: Normocephalic and atraumatic.  Eyes: Conjunctivae and EOM are normal.  Cardiovascular: Normal rate and regular rhythm.   Pulmonary/Chest: Effort normal and breath sounds normal. No stridor. No respiratory distress.  Anterior chest wall scar c/w prior cardiac surgery  Abdominal: She exhibits no distension.  Musculoskeletal: She exhibits edema.  Neurological: She is alert and oriented to person, place, and time. No cranial nerve deficit.  Skin: Skin is warm and dry.  Psychiatric: She has a normal mood and affect.    ED Course  Procedures (including critical care time) Labs Review Labs Reviewed  CBC - Abnormal; Notable for the following:    RBC 3.46 (*)    Hemoglobin 10.7 (*)    HCT 33.9 (*)    All other components within normal limits   BASIC METABOLIC PANEL  PRO B NATRIURETIC PEPTIDE  POCT I-STAT TROPONIN  I   Imaging Review No results found.  EKG Interpretation    Date/Time:  Saturday March 30 2013 18:02:02 EST Ventricular Rate:  71 PR Interval:  256 QRS Duration: 166 QT Interval:  350 QTC Calculation: 380 R Axis:   -35 Text Interpretation:  AV dual-paced rhythm with prolonged AV conduction Abnormal ECG ATRIAL PACED RHYTHM Abnormal ekg Confirmed by Gerhard Munch  MD [phone]) on 03/31/2013 12:06:56 AM           Update: patient's labs notable for hypokalemia, elevated creatinine from baseline.  With the hypokalemia, the patient required IV repletion.  I discussed the firing of the patient's defibrillator with the technologist.  Pacer detected ventricular fibrillation, fired appropriately.   MDM  No diagnosis found. Patient presents after an episode of defibrillator firing is found to be hypokalemic, but in no distress.  Patient received IV repletion, oral repletion, was admitted for further evaluation and management.    Gerhard Munch, MD 03/31/13 [phone]

## 2013-03-30 NOTE — ED Notes (Signed)
MD notified of critical potassium

## 2013-03-30 NOTE — H&P (Signed)
PCP:  Allean Found, MD  Cardiology: Donnie Aho   Chief Complaint:  Defibrillator fired.   HPI: Margaret [name] is a 72 y.o. female   has a past medical history of NICM (nonischemic cardiomyopathy); Systolic CHF, chronic; Severe mitral regurgitation; Cardiac arrest - ventricular fibrillation; Atrial fibrillation; HTN (hypertension); HLD (hyperlipidemia); Achalasia; Recurrent UTI; Obesity; Arthritis; and Anoxic brain injury (1996).   Presented with  Patient was doing well all day. They were driving to have a dinner when patient went suddenly unconscious and was snoring. She then had a jolt consistent with defibrillator firing and became conscious few second after. Denies any chest pain or shortness of breath. 1 month ago patient came in to ER with fluid overload and was agressively diuresed. Dr. Donnie Aho have also increased her lasix form 80 mg BID to 160 mg in Am and 80 mg at night and added metolazone. Patient have diuresed 15 lb in the past week and mostly past few days. Family not sure of her dry weight. Currently it is 207 lb. When she presented to Er today her K was noted to be down 2.2.  Troponin is negative. Pacemaker was interrogated and found to be functioning appropriate and have fired in response to V.Fib. Cardiology has been notified. Hospitalist called for admission.   Review of Systems:    Pertinent positives include: syncope  Constitutional:  No weight loss, night sweats, Fevers, chills, fatigue, weight loss  HEENT:  No headaches, Difficulty swallowing,Tooth/dental problems,Sore throat,  No sneezing, itching, ear ache, nasal congestion, post nasal drip,  Cardio-vascular:  No chest pain, Orthopnea, PND, anasarca, dizziness, palpitations.no Bilateral lower extremity swelling  GI:  No heartburn, indigestion, abdominal pain, nausea, vomiting, diarrhea, change in bowel habits, loss of appetite, melena, blood in stool, hematemesis Resp:  no shortness of breath at rest. No  dyspnea on exertion, No excess mucus, no productive cough, No non-productive cough, No coughing up of blood.No change in color of mucus.No wheezing. Skin:  no rash or lesions. No jaundice GU:  no dysuria, change in color of urine, no urgency or frequency. No straining to urinate.  No flank pain.  Musculoskeletal:  No joint pain or no joint swelling. No decreased range of motion. No back pain.  Psych:  No change in mood or affect. No depression or anxiety. No memory loss.  Neuro: no localizing neurological complaints, no tingling, no weakness, no double vision, no gait abnormality, no slurred speech, no confusion  Otherwise ROS are negative except for above, 10 systems were reviewed  Past Medical History: Past Medical History  Diagnosis Date  . NICM (nonischemic cardiomyopathy)     EF 35%; cath 2/12 no CAD  . Systolic CHF, chronic   . Severe mitral regurgitation     s/p complex mitral valve repair with cox maze + LAA clipping, removal of RV lead, and implantation of CRT-D in abdomen  . Cardiac arrest - ventricular fibrillation     in 1990s  . Atrial fibrillation     amiodarone  . HTN (hypertension)   . HLD (hyperlipidemia)   . Achalasia     s/p dilation  . Recurrent UTI   . Obesity   . Arthritis   . Anoxic brain injury 1996    s/p cardiac arrest    Past Surgical History  Procedure Laterality Date  . Defibrillator generator explantation and reimplantation; and  08/02/2001  . Device migration with anticipated pocket revision  10/29/2003  . Chronic icd pocket infection with erosion of the entire  device  09/24/2004  . Attempted implantation of an implantable cardioverter-  12/30/2004  . Cardioversion  04/14/2010  . Median sternotomy  07/16/2010  . Mitral valve repair  07/16/2010  . Cox maze procedure (complete biatrial lesion set with clipping of  07/16/2010  . Removal of old endocardial right ventricular defibrillator lead.  07/16/2010  . Placement of dual chamber pacemaker  and implantable cardiac  07/16/2010  . Placement of swan ganz pulmonary artery catheter via left femoral access.  07/16/2010     Medications: Prior to Admission medications   Medication Sig Start Date End Date Taking? Authorizing Provider  amiodarone (PACERONE) 200 MG tablet Take 200 mg by mouth daily.    Yes Historical Provider, MD  calcium carbonate (TUMS - DOSED IN MG ELEMENTAL CALCIUM) 500 MG chewable tablet Chew 2 tablets by mouth daily. Flavored TUMS   Yes Historical Provider, MD  desloratadine (CLARINEX) 5 MG tablet Take 5 mg by mouth daily.   Yes Historical Provider, MD  fluticasone (FLONASE) 50 MCG/ACT nasal spray Place 1 drop into both nostrils at bedtime.  06/26/10  Yes Historical Provider, MD  folic acid (FOLVITE) 1 MG tablet Take 1 tablet by mouth daily. 08/31/10  Yes Historical Provider, MD  furosemide (LASIX) 80 MG tablet Take 80 mg by mouth 2 (two) times daily.   Yes Historical Provider, MD  levothyroxine (SYNTHROID, LEVOTHROID) 50 MCG tablet Take 50 mcg by mouth daily before breakfast.   Yes Historical Provider, MD  lovastatin (MEVACOR) 40 MG tablet Take 40 mg by mouth daily.  08/12/10  Yes Historical Provider, MD  metoprolol tartrate (LOPRESSOR) 25 MG tablet Take 12.5 mg by mouth Twice daily.  08/31/10  Yes Historical Provider, MD  OVER THE COUNTER MEDICATION Take 1 capsule by mouth at bedtime. Equate sleep aid (blue capsule)   Yes Historical Provider, MD  potassium chloride SA (K-DUR,KLOR-CON) 20 MEQ tablet Take 20 mEq by mouth daily.   Yes Historical Provider, MD  Probiotic Product (ALIGN PO) Take 1 tablet by mouth daily.   Yes Historical Provider, MD  warfarin (COUMADIN) 1 MG tablet Take 2 mg by mouth every evening.    Yes Historical Provider, MD    Allergies:  No Known Allergies  Social History:  Ambulatory wheelchair bound, transfer with help Lives at  Home with family   reports that she has never smoked. She has never used smokeless tobacco. She reports that she does  not drink alcohol or use illicit drugs.   Family History: family history includes Diabetes Mellitus II in her brother; Lung cancer in her father; Stroke in her mother.    Physical Exam: Patient Vitals for the past 24 hrs:  BP Temp Temp src Pulse Resp SpO2 Height Weight  03/30/13 2030 108/43 mmHg - - 70 17 98 % - -  03/30/13 1921 110/64 mmHg 98.8 F (37.1 C) Oral 71 13 98 % - -  03/30/13 1824 114/44 mmHg - - 71 20 94 % - -  03/30/13 1804 98/50 mmHg 97.9 F (36.6 C) Oral 69 18 98 % 5\' 5"  (1.651 m) 93.98 kg (207 lb 3 oz)    1. General:  in No Acute distress 2. Psychological: Alert and   Oriented 3. Head/ENT:   Moist   Mucous Membranes                          Head Non traumatic, neck supple  Poor Dentition 4. SKIN:   decreased Skin turgor,  Skin clean Dry and intact no rash 5. Heart: Regular rate and rhythm no Murmur, Rub or gallop 6. Lungs:  no wheezes minor crackles   7. Abdomen: Soft, non-tender, Non distended obese 8. Lower extremities: no clubbing, cyanosis, 3+ edema bilaterally  9. Neurologically Grossly intact, moving all 4 extremities equally 10. MSK: Normal range of motion  body mass index is 34.48 kg/(m^2).   Labs on Admission:   Recent Labs  03/30/13 1815  NA 139  K 2.2*  CL 88*  CO2 33*  GLUCOSE 126*  BUN 59*  CREATININE 2.67*  CALCIUM 8.9   No results found for this basename: AST, ALT, ALKPHOS, BILITOT, PROT, ALBUMIN,  in the last 72 hours No results found for this basename: LIPASE, AMYLASE,  in the last 72 hours  Recent Labs  03/30/13 1815  WBC 5.5  HGB 10.7*  HCT 33.9*  MCV 98.0  PLT 232   No results found for this basename: CKTOTAL, CKMB, CKMBINDEX, TROPONINI,  in the last 72 hours No results found for this basename: TSH, T4TOTAL, FREET3, T3FREE, THYROIDAB,  in the last 72 hours No results found for this basename: VITAMINB12, FOLATE, FERRITIN, TIBC, IRON, RETICCTPCT,  in the last 72 hours Lab Results  Component  Value Date   HGBA1C 5.6 03/25/2012    Estimated Creatinine Clearance: 21.3 ml/min (by C-G formula based on Cr of 2.67). ABG    Component Value Date/Time   PHART 7.401* 07/16/2010 1718   HCO3 23.3 07/16/2010 1718   TCO2 24 07/16/2010 1718   ACIDBASEDEF 1.0 07/16/2010 1718   O2SAT 99.0 07/16/2010 1718     Lab Results  Component Value Date   DDIMER  Value: 0.22        AT THE INHOUSE ESTABLISHED CUTOFF VALUE OF 0.48 ug/mL FEU, THIS ASSAY HAS BEEN DOCUMENTED IN THE LITERATURE TO HAVE A SENSITIVITY AND NEGATIVE PREDICTIVE VALUE OF AT LEAST 98 TO 99%.  THE TEST RESULT SHOULD BE CORRELATED WITH AN ASSESSMENT OF THE CLINICAL PROBABILITY OF DVT / VTE. 12/15/2009     Other results:  I have pearsonaly reviewed this: ECG REPORT  Rate: 71  Rhythm: paced ST&T Change: no ischemia  BNP 1353   Cultures:    Component Value Date/Time   SDES URINE, RANDOM 03/02/2013 2356   SPECREQUEST NONE 03/02/2013 2356   CULT  Value: KLEBSIELLA PNEUMONIAE Performed at [facility] 03/02/2013 2356   REPTSTATUS 03/05/2013 FINAL 03/02/2013 2356       Radiological Exams on Admission: Dg Chest 2 View  03/30/2013   CLINICAL DATA:  Syncope.  EXAM: CHEST  2 VIEW  COMPARISON:  03/02/2013  FINDINGS: The patient status post median sternotomy and CABG procedure. Moderate cardiac enlargement. There is a defibrillator battery pack in the projection of the left upper quadrant of the abdomen. Disc tibial 8 air wires appear similar to the previous exam. No pleural effusion or edema. Both lungs are clear. The visualized skeletal structures are unremarkable.  IMPRESSION: No active cardiopulmonary disease.   Electronically Signed   By: Signa Kell M.D.   On: 03/30/2013 19:01    Chart has been reviewed  Assessment/Plan  72 year old female with history of nonischemic cardiomyopathy status post defibrillator presents after an event of defibrillator firing appropriately due to episode of ventricular fibrillation in the  setting of hypokalemia likely induced by aggressive diuresis  Present on Admission:  . Paroxysmal ventricular fibrillation - patient with known non ischemic  cardiomyopathy followed by Dr. Donnie Aho. This occurred in the setting of hypokalemia. Will replace potassium and cycle cardiac markers she had had a recent echo gram done 03/29/2013. Continue her beta blocker  . Atrial fibrillation - rate controlled continue Coumadin  . Cardiomyopathy, nonischemic - patient have had a recent echo gram done results of which are not available to me at this time. Will continue her Lasix at the current doses and has been decreased back down to her baseline and she still has peripheral edema. Would have her followup with cardiology for father adjustment of her diuresis  . CKD (chronic kidney disease), stage III - creatinine somewhat above her baseline given likely as a result of recent aggressive diuresis. Her Lasix has been decreased back to baseline at this point will continue to monitor renal function avoid fluid overload as patient is still has some peripheral edema which is impressive  . Hypertension - currently stable continue home medications  . ICD-dual-chamber-Medtronic - apparently he had ICD fired as a result of ventricular fibrillation. We'll cycle cardiac markers despite report is unremarkable. ICD has been evaluated and appears to be functioning appropriately   hypokalemia - will replace and check magnesium level. Increase her home dose of potassium to 40 twice a day and recommend to have her followup to have her electrolytes closely monitored  Prophylaxis: coumadin   CODE STATUS: FULL CODE  Other plan as per orders.  I have spent a total of 55 min on this admission  Tionne Carelli 03/30/2013, 9:27 PM

## 2013-03-30 NOTE — Progress Notes (Signed)
ANTICOAGULATION CONSULT NOTE - Initial Consult  Pharmacy Consult for Coumadin Indication: atrial fibrillation  No Known Allergies  Patient Measurements: Height: 5\' 5"  (165.1 cm) Weight: 227 lb 1.6 oz (103.012 kg) IBW/kg (Calculated) : 57  Vital Signs: Temp: 98.6 F (37 C) (12/06 2236) Temp src: Oral (12/06 2236) BP: 119/74 mmHg (12/06 2236) Pulse Rate: 72 (12/06 2236)  Labs:  Recent Labs  03/30/13 1815 03/30/13 2132  HGB 10.7*  --   HCT 33.9*  --   PLT 232  --   LABPROT  --  24.5*  INR  --  2.29*  CREATININE 2.67*  --     Estimated Creatinine Clearance: 22.3 ml/min (by C-G formula based on Cr of 2.67).   Medical History: Past Medical History  Diagnosis Date  . NICM (nonischemic cardiomyopathy)     EF 35%; cath 2/12 no CAD  . Systolic CHF, chronic   . Severe mitral regurgitation     s/p complex mitral valve repair with cox maze + LAA clipping, removal of RV lead, and implantation of CRT-D in abdomen  . Cardiac arrest - ventricular fibrillation     in 1990s  . Atrial fibrillation     amiodarone  . HTN (hypertension)   . HLD (hyperlipidemia)   . Achalasia     s/p dilation  . Recurrent UTI   . Obesity   . Arthritis   . Anoxic brain injury 1996    s/p cardiac arrest     Medications:  Prescriptions prior to admission  Medication Sig Dispense Refill  . amiodarone (PACERONE) 200 MG tablet Take 200 mg by mouth daily.       . calcium carbonate (TUMS - DOSED IN MG ELEMENTAL CALCIUM) 500 MG chewable tablet Chew 2 tablets by mouth daily. Flavored TUMS      . desloratadine (CLARINEX) 5 MG tablet Take 5 mg by mouth daily.      . fluticasone (FLONASE) 50 MCG/ACT nasal spray Place 1 drop into both nostrils at bedtime.       . folic acid (FOLVITE) 1 MG tablet Take 1 tablet by mouth daily.      . furosemide (LASIX) 80 MG tablet Take 80 mg by mouth 2 (two) times daily.      Marland Kitchen levothyroxine (SYNTHROID, LEVOTHROID) 50 MCG tablet Take 50 mcg by mouth daily before  breakfast.      . lovastatin (MEVACOR) 40 MG tablet Take 40 mg by mouth daily.       . metoprolol tartrate (LOPRESSOR) 25 MG tablet Take 12.5 mg by mouth Twice daily.       Marland Kitchen OVER THE COUNTER MEDICATION Take 1 capsule by mouth at bedtime. Equate sleep aid (blue capsule)      . potassium chloride SA (K-DUR,KLOR-CON) 20 MEQ tablet Take 20 mEq by mouth daily.      . Probiotic Product (ALIGN PO) Take 1 tablet by mouth daily.      Marland Kitchen warfarin (COUMADIN) 1 MG tablet Take 2 mg by mouth every evening.        Scheduled:  . [START ON 03/31/2013] amiodarone  200 mg Oral Daily  . docusate sodium  100 mg Oral BID  . [START ON 03/31/2013] furosemide  80 mg Oral BID  . [START ON 03/31/2013] levothyroxine  50 mcg Oral QAC breakfast  . [START ON 03/31/2013] loratadine  10 mg Oral Daily  . metoprolol tartrate  12.5 mg Oral BID  . potassium chloride  10 mEq Intravenous Q1 Hr x 6  .  potassium chloride SA  20 mEq Oral BID  . [START ON 03/31/2013] simvastatin  20 mg Oral q1800  . sodium chloride  3 mL Intravenous Q12H  . sodium chloride  3 mL Intravenous Q12H  . warfarin  2 mg Oral q1800  . [START ON 03/31/2013] Warfarin - Pharmacist Dosing Inpatient   Does not apply q1800    Assessment: 72yo female was in [place] when she became unconscious and started snoring, then had jolt c/w defibrillator firing and subsequently became conscious, apparent Vfib episode d/t hypokalemia thought to be induced by aggressive diuresis, to continue Coumadin for Afib; admitted with therapeutic INR.  Goal of Therapy:  INR 2-3   Plan:  Will continue home Coumadin dose of 2mg  daily and monitor INR.  Vernard Gambles, PharmD, BCPS  03/30/2013,11:35 PM

## 2013-03-30 NOTE — ED Notes (Signed)
Charge RN made aware of need to interrogate medtronic device.

## 2013-03-30 NOTE — ED Notes (Signed)
Charge at bedside to interrogate AICD

## 2013-03-30 NOTE — ED Notes (Signed)
Family reports that the pt was riding in the car and had a period of unresponsiveness and the her body jerked. This episode happened twice. Pt reports she has no pain at this time. Reports some light-headedness and nausea at this time. Pt has a AICD implanted in her abd. No chest pain or SOB at this time. Cardiologist is Dr. Donnie Aho.

## 2013-03-30 NOTE — ED Notes (Signed)
Pt resting in bed- alert and oriented. MD at bedside along with family.

## 2013-03-31 DIAGNOSIS — I472 Ventricular tachycardia: Principal | ICD-10-CM

## 2013-03-31 DIAGNOSIS — I5023 Acute on chronic systolic (congestive) heart failure: Secondary | ICD-10-CM

## 2013-03-31 LAB — COMPREHENSIVE METABOLIC PANEL
ALT: 21 U/L (ref 0–35)
AST: 37 U/L (ref 0–37)
Albumin: 3.1 g/dL — ABNORMAL LOW (ref 3.5–5.2)
CO2: 33 mEq/L — ABNORMAL HIGH (ref 19–32)
Calcium: 8.3 mg/dL — ABNORMAL LOW (ref 8.4–10.5)
Creatinine, Ser: 2.65 mg/dL — ABNORMAL HIGH (ref 0.50–1.10)
GFR calc Af Amer: 19 mL/min — ABNORMAL LOW (ref >90)
GFR calc non Af Amer: 17 mL/min — ABNORMAL LOW (ref >90)
Glucose, Bld: 100 mg/dL — ABNORMAL HIGH (ref 70–99)
Potassium: 2.5 mEq/L — CL (ref 3.5–5.1)
Total Protein: 6.5 g/dL (ref 6.0–8.3)

## 2013-03-31 LAB — VITAMIN B12: Vitamin B-12: 443 pg/mL (ref 211–911)

## 2013-03-31 LAB — TROPONIN I: Troponin I: <0.3 ng/mL (ref <0.30)

## 2013-03-31 LAB — CBC
HCT: 30.2 % — ABNORMAL LOW (ref 36.0–46.0)
Hemoglobin: 9.4 g/dL — ABNORMAL LOW (ref 12.0–15.0)
MCH: 30.7 pg (ref 26.0–34.0)
Platelets: 195 10*3/uL (ref 150–400)
RBC: 3.06 MIL/uL — ABNORMAL LOW (ref 3.87–5.11)

## 2013-03-31 LAB — MAGNESIUM
Magnesium: 2.6 mg/dL — ABNORMAL HIGH (ref 1.5–2.5)
Magnesium: 2.6 mg/dL — ABNORMAL HIGH (ref 1.5–2.5)

## 2013-03-31 LAB — PROTIME-INR: Prothrombin Time: 28.3 seconds — ABNORMAL HIGH (ref 11.6–15.2)

## 2013-03-31 LAB — BASIC METABOLIC PANEL
CO2: 32 mEq/L (ref 19–32)
Chloride: 95 mEq/L — ABNORMAL LOW (ref 96–112)
Creatinine, Ser: 2.58 mg/dL — ABNORMAL HIGH (ref 0.50–1.10)
GFR calc Af Amer: 20 mL/min — ABNORMAL LOW (ref >90)
Potassium: 3.2 mEq/L — ABNORMAL LOW (ref 3.5–5.1)

## 2013-03-31 LAB — IRON AND TIBC
Iron: 60 ug/dL (ref 42–135)
Saturation Ratios: 18 % — ABNORMAL LOW (ref 20–55)
TIBC: 327 ug/dL (ref 250–470)
UIBC: 267 ug/dL (ref 125–400)

## 2013-03-31 LAB — FOLATE: Folate: >20 ng/mL

## 2013-03-31 LAB — RETICULOCYTES: Retic Ct Pct: 2.9 % (ref 0.4–3.1)

## 2013-03-31 LAB — TSH: TSH: 12.344 u[IU]/mL — ABNORMAL HIGH (ref 0.350–4.500)

## 2013-03-31 MED ORDER — SPIRONOLACTONE 50 MG PO TABS
50.0000 mg | ORAL_TABLET | Freq: Every day | ORAL | Status: DC
  Filled 2013-03-31: qty 1

## 2013-03-31 MED ORDER — POTASSIUM CHLORIDE 10 MEQ/100ML IV SOLN
10.0000 meq | INTRAVENOUS | Status: AC
  Administered 2013-03-31 (×5): 10 meq via INTRAVENOUS
  Filled 2013-03-31 (×6): qty 100

## 2013-03-31 MED ORDER — FUROSEMIDE 10 MG/ML IJ SOLN
10.0000 mg/h | INTRAVENOUS | Status: DC
  Filled 2013-03-31: qty 25

## 2013-03-31 MED ORDER — ZOLPIDEM TARTRATE 5 MG PO TABS: 5.0000 mg | ORAL_TABLET | Freq: Every evening | ORAL | Status: DC | PRN

## 2013-03-31 MED ORDER — POTASSIUM CHLORIDE CRYS ER 20 MEQ PO TBCR
40.0000 meq | EXTENDED_RELEASE_TABLET | Freq: Every day | ORAL | Status: DC
  Filled 2013-03-31: qty 2

## 2013-03-31 MED ORDER — LISINOPRIL 5 MG PO TABS
5.0000 mg | ORAL_TABLET | Freq: Every day | ORAL | Status: DC
  Filled 2013-03-31: qty 1

## 2013-03-31 MED ORDER — DIPHENHYDRAMINE HCL 25 MG PO CAPS: 25.0000 mg | ORAL_CAPSULE | Freq: Every evening | ORAL | Status: DC | PRN

## 2013-03-31 MED ORDER — POTASSIUM CHLORIDE CRYS ER 20 MEQ PO TBCR
40.0000 meq | EXTENDED_RELEASE_TABLET | Freq: Two times a day (BID) | ORAL | Status: DC
  Administered 2013-03-31 – 2013-04-02 (×5): 40 meq via ORAL
  Filled 2013-03-31 (×5): qty 2

## 2013-03-31 MED ORDER — POTASSIUM CHLORIDE 10 MEQ/100ML IV SOLN
10.0000 meq | Freq: Once | INTRAVENOUS | Status: AC
  Administered 2013-03-31: 10 meq via INTRAVENOUS
  Filled 2013-03-31: qty 100

## 2013-03-31 MED ORDER — POTASSIUM CHLORIDE 10 MEQ/100ML IV SOLN
10.0000 meq | INTRAVENOUS | Status: DC
  Administered 2013-03-31: 10 meq via INTRAVENOUS
  Filled 2013-03-31 (×4): qty 100

## 2013-03-31 NOTE — Progress Notes (Signed)
PHARMACY CONSULT NOTE   Pharmacy Consult for :  Coumadin Indication: atrial fibrillation  Dosing weight 103 kg  Labs:  Recent Labs  03/30/13 1815 03/30/13 2132 03/31/13 0445  HGB 10.7*  --  9.4*  HCT 33.9*  --  30.2*  PLT 232  --  195  INR  --  2.29* 2.77*  CREATININE 2.67*  --  2.65*   Estimated Creatinine Clearance: 22.5 ml/min (by C-G formula based on Cr of 2.65).  Medications:  Prescriptions prior to admission  Medication Sig Dispense Refill  . amiodarone (PACERONE) 200 MG tablet Take 200 mg by mouth daily.       . calcium carbonate (TUMS - DOSED IN MG ELEMENTAL CALCIUM) 500 MG chewable tablet Chew 2 tablets by mouth daily. Flavored TUMS      . desloratadine (CLARINEX) 5 MG tablet Take 5 mg by mouth daily.      . fluticasone (FLONASE) 50 MCG/ACT nasal spray Place 1 drop into both nostrils at bedtime.       . folic acid (FOLVITE) 1 MG tablet Take 1 tablet by mouth daily.      . furosemide (LASIX) 80 MG tablet Take 80 mg by mouth 2 (two) times daily.      Marland Kitchen levothyroxine (SYNTHROID, LEVOTHROID) 50 MCG tablet Take 50 mcg by mouth daily before breakfast.      . lovastatin (MEVACOR) 40 MG tablet Take 40 mg by mouth daily.       . metoprolol tartrate (LOPRESSOR) 25 MG tablet Take 12.5 mg by mouth Twice daily.       Marland Kitchen OVER THE COUNTER MEDICATION Take 1 capsule by mouth at bedtime. Equate sleep aid (blue capsule)      . potassium chloride SA (K-DUR,KLOR-CON) 20 MEQ tablet Take 20 mEq by mouth daily.      . Probiotic Product (ALIGN PO) Take 1 tablet by mouth daily.      Marland Kitchen warfarin (COUMADIN) 1 MG tablet Take 2 mg by mouth every evening.        Scheduled:  . amiodarone  200 mg Oral Daily  . docusate sodium  100 mg Oral BID  . levothyroxine  50 mcg Oral QAC breakfast  . lisinopril  5 mg Oral Daily  . loratadine  10 mg Oral Daily  . metoprolol tartrate  12.5 mg Oral BID  . potassium chloride  10 mEq Intravenous Q1 Hr x 6  . potassium chloride SA  20 mEq Oral BID  . [START ON  04/01/2013] potassium chloride  40 mEq Oral Daily  . simvastatin  20 mg Oral q1800  . sodium chloride  3 mL Intravenous Q12H  . sodium chloride  3 mL Intravenous Q12H  . spironolactone  50 mg Oral Daily  . warfarin  2 mg Oral q1800  . Warfarin - Pharmacist Dosing Inpatient   Does not apply q1800    Assessment:  72 y/o female on continuing on chronic Coumadin for atrial fibrillation  INR therapeutic on home dose, INR 2.77.  No bleeding complications noted    Goal of Therapy:   INR 2-3   Plan:  1. Continue Coumadin 2 mg daily. Daily INR's, CBC.   Adolph Clutter, Elisha Headland, Pharm.D. 03/31/2013, 10:00 AM

## 2013-03-31 NOTE — Consult Note (Signed)
ELECTROPHYSIOLOGY CONSULT NOTE  Patient ID: Margaret [name], MRN: [id number], DOB/AGE: [birth year] 72 y.o. Admit date: 03/30/2013 Date of Consult: 03/31/2013  Primary Physician: Allean Found, MD Primary Cardiologist: WST  Chief Complaint:  ICD discharge   HPI Margaret [name] is a 72 y.o. female seen following ICD discharge prompting admission. Device interrogation demonstrated polymorphic ventricular tachycardia occurring in the context of profound hypokalemia which emerged the context of recent addition of metolazone therapy. This was undertaken because of profound volume overload; there has been intercurrent 15-20 pound weight loss.   this was apparently done by Dr. Donnie Aho; his notes are not available for review  Rhythm interrogation of her device has been solely sinus     she has a history of aborted cardiac arrest status post ICD implantation was complicated by erosion explantation and reimplantation of an epicardial/abdominal device. This was done at the time of mitral valve repair and maze surgery 2012.  In March she was admitted to hospital with severe mitral regurgitation and was subjected to intraoperative repair at which time a CRT defibrillator system    Past Medical History  Diagnosis Date  . NICM (nonischemic cardiomyopathy)     EF 35%; cath 2/12 no CAD  . Systolic CHF, chronic   . Severe mitral regurgitation     s/p complex mitral valve repair with cox maze + LAA clipping, removal of RV lead, and implantation of CRT-D in abdomen  . Cardiac arrest - ventricular fibrillation     in 1990s  . Atrial fibrillation     amiodarone  . HTN (hypertension)   . HLD (hyperlipidemia)   . Achalasia     s/p dilation  . Recurrent UTI   . Obesity   . Arthritis   . Anoxic brain injury 1996    s/p cardiac arrest       Surgical History:  Past Surgical History  Procedure Laterality Date  . Defibrillator generator explantation and reimplantation; and  08/02/2001  .  Device migration with anticipated pocket revision  10/29/2003  . 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 Attempted implantation of an implantable cardioverter-  12/30/2004  . Cardioversion  04/14/2010  . Median sternotomy  07/16/2010  . Mitral valve repair  07/16/2010  . Cox maze procedure (complete biatrial lesion set with clipping of  07/16/2010  . Removal of old endocardial right ventricular defibrillator lead.  07/16/2010  . Placement of dual chamber pacemaker and implantable cardiac  07/16/2010  . Placement of swan ganz pulmonary artery catheter via left femoral access.  07/16/2010     Home Meds: Prior to Admission medications   Medication Sig Start Date End Date Taking? Authorizing Provider  amiodarone (PACERONE) 200 MG tablet Take 200 mg by mouth daily.    Yes Historical Provider, MD  calcium carbonate (TUMS - DOSED IN MG ELEMENTAL CALCIUM) 500 MG chewable tablet Chew 2 tablets by mouth daily. Flavored TUMS   Yes Historical Provider, MD  desloratadine (CLARINEX) 5 MG tablet Take 5 mg by mouth daily.   Yes Historical Provider, MD  fluticasone (FLONASE) 50 MCG/ACT nasal spray Place 1 drop into both nostrils at bedtime.  06/26/10  Yes Historical Provider, MD  folic acid (FOLVITE) 1 MG tablet Take 1 tablet by mouth daily. 08/31/10  Yes Historical Provider, MD  furosemide (LASIX) 80 MG tablet Take 80 mg by mouth 2 (two) times daily.   Yes Historical Provider, MD  levothyroxine (SYNTHROID, LEVOTHROID) 50 MCG tablet Take  50 mcg by mouth daily before breakfast.   Yes Historical Provider, MD  lovastatin (MEVACOR) 40 MG tablet Take 40 mg by mouth daily.  08/12/10  Yes Historical Provider, MD  metoprolol tartrate (LOPRESSOR) 25 MG tablet Take 12.5 mg by mouth Twice daily.  08/31/10  Yes Historical Provider, MD  OVER THE COUNTER MEDICATION Take 1 capsule by mouth at bedtime. Equate sleep aid (blue capsule)   Yes Historical Provider, MD  potassium chloride SA  (K-DUR,KLOR-CON) 20 MEQ tablet Take 20 mEq by mouth daily.   Yes Historical Provider, MD  Probiotic Product (ALIGN PO) Take 1 tablet by mouth daily.   Yes Historical Provider, MD  warfarin (COUMADIN) 1 MG tablet Take 2 mg by mouth every evening.    Yes Historical Provider, MD    Inpatient Medications:  . amiodarone  200 mg Oral Daily  . docusate sodium  100 mg Oral BID  . levothyroxine  50 mcg Oral QAC breakfast  . lisinopril  5 mg Oral Daily  . loratadine  10 mg Oral Daily  . metoprolol tartrate  12.5 mg Oral BID  . potassium chloride  10 mEq Intravenous Q1 Hr x 6  . potassium chloride SA  20 mEq Oral BID  . [START ON 04/01/2013] potassium chloride  40 mEq Oral Daily  . simvastatin  20 mg Oral q1800  . sodium chloride  3 mL Intravenous Q12H  . sodium chloride  3 mL Intravenous Q12H  . spironolactone  50 mg Oral Daily  . warfarin  2 mg Oral q1800  . Warfarin - Pharmacist Dosing Inpatient   Does not apply q1800     Allergies: No Known Allergies  History   Social History  . Marital Status: Married    Spouse Name: Homero Fellers    Number of Children: 2  . Years of Education: college   Occupational History  . Retired    Social History Main Topics  . Smoking status: Never Smoker   . Smokeless tobacco: Never Used  . Alcohol Use: No  . Drug Use: No  . Sexual Activity: Not on file   Other Topics Concern  . Not on file   Social History Narrative   Patient lives with spouse.   Caffeine Use: 1 soda daily     Family History  Problem Relation Age of Onset  . Stroke Mother   . Lung cancer Father   . Diabetes Mellitus II Brother      ROS:  Please see the history of present illness.     All other systems reviewed and negative.    Physical Exam:   Blood pressure 111/70, pulse 70, temperature 98.2 F (36.8 C), temperature source Oral, resp. rate 17, height 5\' 5"  (1.651 m), weight 227 lb 1.2 oz (103 kg), SpO2 93.00%. General: Well developed, well nourished female in no acute  distress. Head: Normocephalic, atraumatic, sclera non-icteric, no xanthomas, nares are without discharge. EENT: normal Lymph Nodes:  none Back: without scoliosis/kyphosis* *, no CVA tendersness Neck: Negative for carotid bruits. JVD 8-9 Lungs: Clear bilaterally to auscultation without wheezes, rales, or rhonchi. Breathing is unlabored. Heart: RRR with S1 S2 2/6 murmur , rubs, or gallops appreciated. Abdomen: Soft, non-tender, non-distended with normoactive bowel sounds. No hepatomegaly. No rebound/guarding. No obvious abdominal masses. Msk:  Strength and tone appear normal for age. Extremities: No clubbing or cyanosis.  3+ edema.  Distal pedal pulses are 2+ and equal bilaterally. Skin: Warm and Dry Neuro: Alert and oriented X 3. CN III-XII  intact Grossly normal sensory and motor function . Psych:  Responds to questions appropriately with a normal affect.      Labs: Cardiac Enzymes  Recent Labs  03/31/13 0005 03/31/13 0448  TROPONINI <0.30 <0.30   CBC Lab Results  Component Value Date   WBC 4.1 03/31/2013   HGB 9.4* 03/31/2013   HCT 30.2* 03/31/2013   MCV 98.7 03/31/2013   PLT 195 03/31/2013   PROTIME:  Recent Labs  03/30/13 2132 03/31/13 0445  LABPROT 24.5* 28.3*  INR 2.29* 2.77*   Chemistry  Recent Labs Lab 03/31/13 0445  NA 142  K 2.5*  CL 95*  CO2 33*  BUN 58*  CREATININE 2.65*  CALCIUM 8.3*  PROT 6.5  BILITOT 0.9  ALKPHOS 76  ALT 21  AST 37  GLUCOSE 100*   Lipids Lab Results  Component Value Date   CHOL 135 03/25/2012   HDL 60 03/25/2012   LDLCALC 54 03/25/2012   TRIG 104 03/25/2012   BNP Pro B Natriuretic peptide (BNP)  Date/Time Value Range Status  03/30/2013  6:15 PM 1353.0* 0 - 125 pg/mL Final  03/02/2013 10:45 PM 1436.0* 0 - 125 pg/mL Final  06/09/2012 10:46 PM 3502.0* 0 - 125 pg/mL Final  07/05/2010  4:40 AM 35.0  0.0 - 100.0 pg/mL Final   Miscellaneous Lab Results  Component Value Date   DDIMER  Value: 0.22        AT THE INHOUSE  ESTABLISHED CUTOFF VALUE OF 0.48 ug/mL FEU, THIS ASSAY HAS BEEN DOCUMENTED IN THE LITERATURE TO HAVE A SENSITIVITY AND NEGATIVE PREDICTIVE VALUE OF AT LEAST 98 TO 99%.  THE TEST RESULT SHOULD BE CORRELATED WITH AN ASSESSMENT OF THE CLINICAL PROBABILITY OF DVT / VTE. 12/15/2009    Radiology/Studies:  Dg Chest 2 View  03/30/2013   CLINICAL DATA:  Syncope.  EXAM: CHEST  2 VIEW  COMPARISON:  03/02/2013  FINDINGS: The patient status post median sternotomy and CABG procedure. Moderate cardiac enlargement. There is a defibrillator battery pack in the projection of the left upper quadrant of the abdomen. Disc tibial 8 air wires appear similar to the previous exam. No pleural effusion or edema. Both lungs are clear. The visualized skeletal structures are unremarkable.  IMPRESSION: No active cardiopulmonary disease.   Electronically Signed   By: Signa Kell M.D.   On: 03/30/2013 19:01   Dg Chest 2 View  03/02/2013   CLINICAL DATA:  Arm swelling  EXAM: CHEST  2 VIEW  COMPARISON:  Prior radiograph from 06/09/2012  FINDINGS: Median sternotomy wires with underlying hardware is unchanged. Cardiomegaly is stable.  Lungs are normally inflated. There is no overt pulmonary edema. No focal infiltrate. No pneumothorax. No pleural effusion is identified.  Multilevel degenerative changes are seen within the visualized spine with accentuation of the normal thoracic kyphosis. No acute osseous abnormality.  IMPRESSION: No active cardiopulmonary disease.   Electronically Signed   By: Rise Mu M.D.   On: 03/02/2013 23:27    EKG:  Atrial pacing at 75 with prolonged antegrade conduction with right bundle branch block  A chest x-ray demonstrates no significant edema; pacing leads are noted in the right ventricle, right atrium and left ventricular surfaces  Assessment and Plan:  1-Polymorphic ventricular tachycardia 2-Hypokalemia arrival to #1 3-acute on chronic systolic heart failure 4-nonischemic cardiomyopathy  status post mitral valve repair, maze operation 5-aborted cardiac arrest status post ICD with subsequent explantation of a precordial device secondary to erosion and implantation of an abdominal device with  residual left ventricular lead not yet in utilized 6-significant first degree AV block/right bundle branch block 7-stage IV renal insufficiency 8-paroxysmal atrial fibrillation on amiodarone without interval episodes  Mrs. Ammons presents with profound hypokalemia and secondary polymorphic ventricular tachycardia. She needs aggressive potassium repletion which will be complicated by her renal insufficiency and so will have to be undertaken with great care. Her volume overload status is much better so I will decrease her Lasix from constant infusion to twice a day.  I will also discontinue her ACE inhibitor and Aldactone; I wonder whether she be a candidate for BiDil which is now much more easily available through insurance programs.  We will check her potassium this evening to make sure we are making progress but not too much so  I also wonder whether she would benefit from recruitment of her left ventricular lead given the profound conduction abnormalities with her first degree AV block and right bundle branch block.  I also underwent that she would tolerate a beta blocker other than metoprolol tartrate; based on the COMET trial there may be better alternative   Dr. Donnie Aho will see in the morning and EP will be available as needed     Sherryl Manges

## 2013-03-31 NOTE — Progress Notes (Signed)
Md paged and aware of Pt's lab results including potassium level. Orders given to administer an additional 40 PO KCL X 1 one. See MAR. Pt resting in bed comfortably with daughter at bedside. Will continue to monitor.

## 2013-03-31 NOTE — Progress Notes (Signed)
[facility]                                                                                Patient Demographics  Margaret [name], is a 72 y.o. female, DOB - 07/26/1940, JXB:[id number]  Admit date - 03/30/2013   Admitting Physician Therisa Doyne, MD  Outpatient Primary MD for the patient is Allean Found, MD  LOS - 1   Chief Complaint  Patient presents with  . AICD Problem        Assessment & Plan    1. Syncope. Firing of AICD. Likely due to V. tach from severe hypokalemia the patient with history of paroxysmal ventricular tachycardia and atrial fibrillation with Medtronic AICD- last echo does not comment on the ejection fraction per se, for now aggressive replacement of potassium, her magnesium levels are stable, continue to monitor electrolytes, continue beta blocker, telemetry monitoring, she symptom-free. Have consulted cardiology. She will require AICD interrogation.    2. History of nonischemic cardiomyopathy EF around 35% in 2012 with Medtronic AICD - Currently in acute on chronic systolic CHF with massive fluid overload. Discussed the case with cardiology will see the patient, placed on IV Lasix drip, continue beta blocker, place her on low-dose ACE inhibitor along with Aldactone and monitor. Fluid and salt restriction.    3. Hypokalemia. Replace aggressively and monitor. Magnesium stable.    4. Hypertension. Continue on beta blocker added ACE inhibitor.    5. Dyslipidemia continue on statin    6. History of atrial fibrillation. Currently on amiodarone and Coumadin continue. Pharmacy monitoring Coumadin. Telemetry monitoring. Currently in paced rhythm.      Code Status: Full  Family Communication: none present  Disposition Plan: Home   Procedures     Consults  Cards   Medications  Scheduled Meds: . amiodarone  200 mg Oral Daily  . docusate sodium  100 mg Oral BID  . levothyroxine  50 mcg Oral QAC breakfast  . lisinopril  5 mg  Oral Daily  . loratadine  10 mg Oral Daily  . metoprolol tartrate  12.5 mg Oral BID  . potassium chloride  10 mEq Intravenous Q1 Hr x 6  . potassium chloride SA  20 mEq Oral BID  . [START ON 04/01/2013] potassium chloride  40 mEq Oral Daily  . simvastatin  20 mg Oral q1800  . sodium chloride  3 mL Intravenous Q12H  . sodium chloride  3 mL Intravenous Q12H  . spironolactone  50 mg Oral Daily  . warfarin  2 mg Oral q1800  . Warfarin - Pharmacist Dosing Inpatient   Does not apply q1800   Continuous Infusions: . furosemide (LASIX) infusion     PRN Meds:.sodium chloride, acetaminophen, acetaminophen, HYDROcodone-acetaminophen, ondansetron (ZOFRAN) IV, ondansetron, sodium chloride  DVT Prophylaxis  Coumadin  Lab Results  Component Value Date   INR 2.77* 03/31/2013   INR 2.29* 03/30/2013   INR 2.23* 06/09/2012     Lab Results  Component Value Date   PLT 195 03/31/2013    Antibiotics     Anti-infectives   None          Subjective:   Margaret [name] today has, No headache, No chest  pain, No abdominal pain - No Nausea, No new weakness tingling or numbness, No Cough - SOB.    Objective:   Filed Vitals:   03/30/13 2030 03/30/13 2130 03/30/13 2236 03/31/13 0526  BP: 108/43 106/49 119/74 111/70  Pulse: 70 70 72 70  Temp:   98.6 F (37 C) 98.2 F (36.8 C)  TempSrc:   Oral Oral  Resp: 17 14 16 17   Height:      Weight:   103.012 kg (227 lb 1.6 oz) 103 kg (227 lb 1.2 oz)  SpO2: 98% 96% 92% 93%    Wt Readings from Last 3 Encounters:  03/31/13 103 kg (227 lb 1.2 oz)  05/31/12 93.895 kg (207 lb)  03/24/12 90.719 kg (200 lb)     Intake/Output Summary (Last 24 hours) at 03/31/13 0949 Last data filed at 03/31/13 0700  Gross per 24 hour  Intake    760 ml  Output    100 ml  Net    660 ml    Exam Awake Alert, Oriented X 3, No new F.N deficits, Normal affect [place].AT,PERRAL Supple Neck,No JVD, No cervical lymphadenopathy appriciated.  Symmetrical Chest wall movement, Good  air movement bilaterally, CTAB RRR,No Gallops,Rubs or new Murmurs, No Parasternal Heave +ve B.Sounds, Abd Soft, Non tender, No organomegaly appriciated, No rebound - guarding or rigidity. No Cyanosis, Clubbing or edema, No new Rash or bruise , 3+ edema mild redness due to edema   Data Review   Micro Results No results found for this or any previous visit (from the past 240 hour(s)).  Radiology Reports Dg Chest 2 View  03/30/2013   CLINICAL DATA:  Syncope.  EXAM: CHEST  2 VIEW  COMPARISON:  03/02/2013  FINDINGS: The patient status post median sternotomy and CABG procedure. Moderate cardiac enlargement. There is a defibrillator battery pack in the projection of the left upper quadrant of the abdomen. Disc tibial 8 air wires appear similar to the previous exam. No pleural effusion or edema. Both lungs are clear. The visualized skeletal structures are unremarkable.  IMPRESSION: No active cardiopulmonary disease.   Electronically Signed   By: Signa Kell M.D.   On: 03/30/2013 19:01   Dg Chest 2 View  03/02/2013   CLINICAL DATA:  Arm swelling  EXAM: CHEST  2 VIEW  COMPARISON:  Prior radiograph from 06/09/2012  FINDINGS: Median sternotomy wires with underlying hardware is unchanged. Cardiomegaly is stable.  Lungs are normally inflated. There is no overt pulmonary edema. No focal infiltrate. No pneumothorax. No pleural effusion is identified.  Multilevel degenerative changes are seen within the visualized spine with accentuation of the normal thoracic kyphosis. No acute osseous abnormality.  IMPRESSION: No active cardiopulmonary disease.   Electronically Signed   By: Rise Mu M.D.   On: 03/02/2013 23:27    CBC  Recent Labs Lab 03/30/13 1815 03/31/13 0445  WBC 5.5 4.1  HGB 10.7* 9.4*  HCT 33.9* 30.2*  PLT 232 195  MCV 98.0 98.7  MCH 30.9 30.7  MCHC 31.6 31.1  RDW 15.0 15.2    Chemistries   Recent Labs Lab 03/30/13 1815 03/31/13 0445  NA 139 142  K 2.2* 2.5*  CL 88*  95*  CO2 33* 33*  GLUCOSE 126* 100*  BUN 59* 58*  CREATININE 2.67* 2.65*  CALCIUM 8.9 8.3*  MG  --  2.6*  AST  --  37  ALT  --  21  ALKPHOS  --  76  BILITOT  --  0.9   ------------------------------------------------------------------------------------------------------------------  estimated creatinine clearance is 22.5 ml/min (by C-G formula based on Cr of 2.65). ------------------------------------------------------------------------------------------------------------------ No results found for this basename: HGBA1C,  in the last 72 hours ------------------------------------------------------------------------------------------------------------------ No results found for this basename: CHOL, HDL, LDLCALC, TRIG, CHOLHDL, LDLDIRECT,  in the last 72 hours ------------------------------------------------------------------------------------------------------------------ No results found for this basename: TSH, T4TOTAL, FREET3, T3FREE, THYROIDAB,  in the last 72 hours ------------------------------------------------------------------------------------------------------------------  Recent Labs  03/31/13 0445  RETICCTPCT 2.9    Coagulation profile  Recent Labs Lab 03/30/13 2132 03/31/13 0445  INR 2.29* 2.77*    No results found for this basename: DDIMER,  in the last 72 hours  Cardiac Enzymes  Recent Labs Lab 03/31/13 0005 03/31/13 0448  TROPONINI <0.30 <0.30   ------------------------------------------------------------------------------------------------------------------ No components found with this basename: POCBNP,      Time Spent in minutes  35   Margaret [name] K M.D on 03/31/2013 at 9:49 AM  Between 7am to 7pm - Pager - [phone]  After 7pm go to [email or web address] - password [email or web address]  And look for the night coverage person covering for me after hours  [facility] Office  [phone]

## 2013-03-31 NOTE — Progress Notes (Signed)
Potassium level 2.5 this a.m. Benedetto Coons M.D. Called and aware see orders

## 2013-04-01 DIAGNOSIS — I5023 Acute on chronic systolic (congestive) heart failure: Secondary | ICD-10-CM

## 2013-04-01 DIAGNOSIS — I472 Ventricular tachycardia: Secondary | ICD-10-CM

## 2013-04-01 DIAGNOSIS — E876 Hypokalemia: Secondary | ICD-10-CM

## 2013-04-01 LAB — BASIC METABOLIC PANEL
BUN: 57 mg/dL — ABNORMAL HIGH (ref 6–23)
CO2: 28 mEq/L (ref 19–32)
Calcium: 8.5 mg/dL (ref 8.4–10.5)
Chloride: 96 mEq/L (ref 96–112)
Creatinine, Ser: 2.42 mg/dL — ABNORMAL HIGH (ref 0.50–1.10)
GFR calc Af Amer: 22 mL/min — ABNORMAL LOW (ref >90)
Glucose, Bld: 119 mg/dL — ABNORMAL HIGH (ref 70–99)
Potassium: 3.7 mEq/L (ref 3.5–5.1)

## 2013-04-01 LAB — MAGNESIUM: Magnesium: 2.9 mg/dL — ABNORMAL HIGH (ref 1.5–2.5)

## 2013-04-01 LAB — PROTIME-INR
INR: 2.53 — ABNORMAL HIGH (ref 0.00–1.49)
Prothrombin Time: 26.4 seconds — ABNORMAL HIGH (ref 11.6–15.2)

## 2013-04-01 MED ORDER — ACETAMINOPHEN 325 MG PO TABS
ORAL_TABLET | ORAL | Status: AC
  Filled 2013-04-01: qty 1

## 2013-04-01 MED ORDER — FUROSEMIDE 10 MG/ML IJ SOLN
80.0000 mg | Freq: Two times a day (BID) | INTRAMUSCULAR | Status: DC
  Administered 2013-04-01 – 2013-04-02 (×3): 80 mg via INTRAVENOUS
  Filled 2013-04-01 (×5): qty 8

## 2013-04-01 NOTE — Care Management Note (Signed)
    Page 1 of 2   04/02/2013     3:02:07 PM   CARE MANAGEMENT NOTE 04/02/2013  Patient:  Margaret [name], Margaret [name]   Account Number:  [id number]  Date Initiated:  04/01/2013  Documentation initiated by:  Lavelle Berland  Subjective/Objective Assessment:   PT ADM WITH VTACH/ICD SHOCKS DUE TO LOW POTASSIUM.  PTA, PT RESIDED AT HOME WITH SPOUSE.  PAST HX OF HH WITH [facility].     Action/Plan:   P.T. RECOMMENDING HH AT DC.  WILL FOLLOW FOR DC NEEDS AS PT PROGRESSES.   Anticipated DC Date:  04/04/2013   Anticipated DC Plan:  HOME W HOME HEALTH SERVICES      DC Planning Services  CM consult      [facility] Choice  HOME HEALTH   Choice offered to / List presented to:  C-1 Patient        HH arranged  HH-1 RN  HH-2 PT  HH-4 NURSE'S AIDE      HH agency  [facility]   Status of service:  Completed, signed off Medicare Important Message given?   (If response is "NO", the following Medicare IM given date fields will be blank) Date Medicare IM given:   Date Additional Medicare IM given:    Discharge Disposition:  HOME W HOME HEALTH SERVICES  Per UR Regulation:  Reviewed for med. necessity/level of care/duration of stay  If discussed at Long Length of Stay Meetings, dates discussed:    Comments:  04/02/13 Altair Stanko,RN,BSN [phone] PT WITH HX OF HH WITH [facility] CARE AND WISHES TO USE AGAIN.  NOTIFIED [facility] OF DC HOME TODAY.  START OF CARE 24-48H POST DC DATE.

## 2013-04-01 NOTE — Evaluation (Signed)
Physical Therapy Evaluation Patient Details Name: GRACE VALLEY MRN: [id number] DOB: Aug 09, 1940 Today's Date: 04/01/2013 Time: 1308-6578 PT Time Calculation (min): 26 min  PT Assessment / Plan / Recommendation History of Present Illness  MALYN AYTES is a 72 y.o. female  has a past medical history of NICM (nonischemic cardiomyopathy); Systolic CHF, chronic; Severe mitral regurgitation; Cardiac arrest - ventricular fibrillation; Atrial fibrillation; HTN (hypertension); HLD (hyperlipidemia); Achalasia; Recurrent UTI; Obesity; Arthritis; and Anoxic brain injury (1996) .Now s/p Defibrillator fired.   Clinical Impression  Patient demonstrates deficits in functional mobility as indicated below. Pt will benefit from continued skilled PT to address deficits and maximize independence. Will continue to see as indicated. rec HHPT upon discharge with family supervision and assist.      PT Assessment  Patient needs continued PT services    Follow Up Recommendations  Home health PT;Supervision/Assistance - 24 hour                Frequency Min 3X/week    Precautions / Restrictions Precautions Precautions: Fall Restrictions Weight Bearing Restrictions: No   Pertinent Vitals/Pain Patient does not report any pain at this time (except aching in the knees upon transfer training)      Mobility  Bed Mobility Details for Bed Mobility Assistance: Pt up in recliner upon arrival Transfers Transfers: Sit to Stand;Stand to Sit Sit to Stand: 4: Min assist;With upper extremity assist;With armrests;From chair/3-in-1 Stand to Sit: 4: Min assist;With upper extremity assist;With armrests;To chair/3-in-1 Details for Transfer Assistance: VCs for keeping weight forward to ease stand>sit and control sit>stand Ambulation/Gait Ambulation/Gait Assistance: 4: Min assist Ambulation Distance (Feet): 8 Feet Assistive device: Rolling walker Ambulation/Gait Assistance Details: very flexed posture with shuffling  gait, VCs for technique and positioning with RW.  Gait Pattern: Step-to pattern;Decreased stride length;Shuffle;Trunk flexed Gait velocity: decreased General Gait Details: increased fear of falling with ambulation        PT Diagnosis: Difficulty walking;Abnormality of gait;Generalized weakness  PT Problem List: Decreased strength;Decreased range of motion;Decreased activity tolerance;Decreased balance;Decreased mobility PT Treatment Interventions: DME instruction;Gait training;Functional mobility training;Therapeutic activities;Therapeutic exercise;Balance training;Patient/family education     PT Goals(Current goals can be found in the care plan section) Acute Rehab PT Goals Patient Stated Goal: daughter--I would like to see her be able to use the walker again. PT Goal Formulation: With patient/family Time For Goal Achievement: 04/15/13 Potential to Achieve Goals: Fair  Visit Information  Last PT Received On: 04/01/13 Assistance Needed: +1 History of Present Illness: ALIESE BRANNUM is a 72 y.o. female  has a past medical history of NICM (nonischemic cardiomyopathy); Systolic CHF, chronic; Severe mitral regurgitation; Cardiac arrest - ventricular fibrillation; Atrial fibrillation; HTN (hypertension); HLD (hyperlipidemia); Achalasia; Recurrent UTI; Obesity; Arthritis; and Anoxic brain injury (1996) .Now s/p Defibrillator fired.        Prior Functioning  Home Living Family/patient expects to be discharged to:: Private residence Living Arrangements: Spouse/significant other;Children Available Help at Discharge: Family;Available 24 hours/day Type of Home: House Home Access: Stairs to enter [employer] of Steps: 4 Entrance Stairs-Rails: Can reach both Home Layout: One level Home Equipment: Bedside commode;Cane - single point Prior Function Level of Independence: Needs assistance Gait / Transfers Assistance Needed: Mod A with transfers with family standing and pulling her  from the front ADL's / Homemaking Assistance Needed: max A for all Communication Communication: No difficulties Dominant Hand: Right    Cognition  Cognition Arousal/Alertness: Awake/alert Behavior During Therapy: WFL for tasks assessed/performed Overall Cognitive Status: Within Functional Limits  for tasks assessed    Extremity/Trunk Assessment Upper Extremity Assessment Upper Extremity Assessment: Generalized weakness Lower Extremity Assessment Lower Extremity Assessment: Generalized weakness   Balance Balance Balance Assessed: Yes Static Standing Balance Static Standing - Balance Support: Bilateral upper extremity supported;During functional activity Static Standing - Level of Assistance: 5: Stand by assistance High Level Balance High Level Balance Activites: Side stepping;Backward walking;Direction changes;Turns High Level Balance Comments: min assist for stability  End of Session PT - End of Session Equipment Utilized During Treatment: Gait belt Activity Tolerance: Patient limited by fatigue Patient left: in chair;with call bell/phone within reach;with family/visitor present Nurse Communication: Mobility status  GP     Fabio Asa 04/01/2013, 4:22 PM Charlotte Crumb, PT DPT  [phone]

## 2013-04-01 NOTE — Evaluation (Signed)
Occupational Therapy Evaluation and Discharge Patient Details Name: Margaret [name] MRN: [id number] DOB: 1940/05/16 Today's Date: 04/01/2013 Time: 1333-1410 OT Time Calculation (min): 37 min  OT Assessment / Plan / Recommendation History of present illness Margaret [name] is a 72 y.o. female  has a past medical history of NICM (nonischemic cardiomyopathy); Systolic CHF, chronic; Severe mitral regurgitation; Cardiac arrest - ventricular fibrillation; Atrial fibrillation; HTN (hypertension); HLD (hyperlipidemia); Achalasia; Recurrent UTI; Obesity; Arthritis; and Anoxic brain injury (1996) .Now s/p Defibrillator fired.    Clinical Impression   This 72 yo female admitted with above presents to acute OT with deficits below. Feel that at this time pt would benefit more from continued PT to get her more mobile and family will continue to help her with BADLs as they were before except for letting her use her arms to push up to stand and to sit down v. Family pulling her up by her arms while standing in front of her. Daughter in agreement, husband seems to think that changing things will make it more difficult (it will take change on all their parts--patient/husband/daughter; however feel in the long run pt will stay mobile longer if they make the changes).    OT Assessment  Patient does not need any further OT services (family will continue A with BADLs)    Follow Up Recommendations  No OT follow up       Equipment Recommendations  None recommended by OT          Precautions / Restrictions Precautions Precautions: Fall Restrictions Weight Bearing Restrictions: No       ADL  Eating/Feeding: Independent Where Assessed - Eating/Feeding: Chair Grooming: Set up Where Assessed - Grooming: Supported sitting Upper Body Bathing: Minimal assistance Where Assessed - Upper Body Bathing: Supported sitting Lower Body Bathing: +1 Total assistance Where Assessed - Lower Body Bathing: Supported sit to  stand Upper Body Dressing: Moderate assistance Where Assessed - Upper Body Dressing: Supported sitting Lower Body Dressing: +1 Total assistance Where Assessed - Lower Body Dressing: Supported sit to Pharmacist, hospital: Minimal Dentist Method: Sit to Barista: Bedside commode Toileting - Clothing Manipulation and Hygiene: Moderate assistance Where Assessed - Toileting Clothing Manipulation and Hygiene: Sit to stand from 3-in-1 or toilet Equipment Used: Gait belt;Rolling walker Transfers/Ambulation Related to ADLs: Min A with cues to scoot towards front of recliner/chair and keep "nose over toes" for sit <>stand ADL Comments: Did work 2 times on stepping onto and off of floor scale (pt min A with increased time due to fear of stepping up 1 inch onto scale     Acute Rehab OT Goals Patient Stated Goal: daughter--I would like to see her be able to use the walker again.  Visit Information  Last OT Received On: 04/01/13 Assistance Needed: +1 History of Present Illness: Margaret [name] is a 72 y.o. female  has a past medical history of NICM (nonischemic cardiomyopathy); Systolic CHF, chronic; Severe mitral regurgitation; Cardiac arrest - ventricular fibrillation; Atrial fibrillation; HTN (hypertension); HLD (hyperlipidemia); Achalasia; Recurrent UTI; Obesity; Arthritis; and Anoxic brain injury (1996) .Now s/p Defibrillator fired.        Prior Functioning     Home Living Family/patient expects to be discharged to:: Private residence Living Arrangements: Spouse/significant other;Children Available Help at Discharge: Family;Available 24 hours/day Type of Home: House Home Access: Stairs to enter [employer] of Steps: 4 Entrance Stairs-Rails: Can reach both Home Layout: One level Home Equipment: Bedside commode;Cane - single  point Prior Function Level of Independence: Needs assistance Gait / Transfers Assistance Needed: Mod A with  transfers with family standing and pulling her from the front ADL's / Homemaking Assistance Needed: max A for all Communication Communication: No difficulties Dominant Hand: Right         Vision/Perception Vision - History Patient Visual Report: No change from baseline   Cognition  Cognition Arousal/Alertness: Awake/alert Behavior During Therapy: WFL for tasks assessed/performed Overall Cognitive Status: Within Functional Limits for tasks assessed    Extremity/Trunk Assessment Upper Extremity Assessment Upper Extremity Assessment: Generalized weakness     Mobility Bed Mobility Details for Bed Mobility Assistance: Pt up in recliner upon arrival Transfers Transfers: Sit to Stand;Stand to Sit Sit to Stand: 4: Min assist;With upper extremity assist;With armrests;From chair/3-in-1 Stand to Sit: 4: Min assist;With upper extremity assist;With armrests;To chair/3-in-1 Details for Transfer Assistance: VCs for keeping weight forward to ease stand>sit and control sit>stand           End of Session OT - End of Session Equipment Utilized During Treatment: Gait belt;Rolling walker Activity Tolerance: Patient tolerated treatment well Patient left:  (working with PT)       Evette Georges [phone] 04/01/2013, 4:04 PM

## 2013-04-01 NOTE — Progress Notes (Signed)
Subjective:  Patient had significant fluid gain and it had been very difficult to weigh her at home due to her inability to stand on the scales. She does feel that her breathing is better. She previously had difficulty with weeping of fluid in her legs and her arms that has improved significantly.  Objective:  Vital Signs in the last 24 hours: BP 110/72  Pulse 70  Temp(Src) 97.6 F (36.4 C) (Oral)  Resp 18  Ht 5\' 5"  (1.651 m)  Wt 104.781 kg (231 lb)  BMI 38.44 kg/m2  SpO2 96%  Physical Exam: Pleasant obese female in no acute distress Lungs:  Clear  Cardiac:  Regular rhythm, normal S1 and S2, no S3 Abdomen:  Soft, nontender, no masses Extremities:  1+ edema present  Intake/Output from previous day: 12/07 0701 - 12/08 0700 In: 360 [P.O.:360] Out: -  Weight Filed Weights   03/30/13 2236 03/31/13 0526 04/01/13 0549  Weight: 103.012 kg (227 lb 1.6 oz) 103 kg (227 lb 1.2 oz) 104.781 kg (231 lb)    Lab Results: Basic Metabolic Panel:  Recent Labs  16/10/96 0445 03/31/13 1410  NA 142 137  K 2.5* 3.2*  CL 95* 95*  CO2 33* 32  GLUCOSE 100* 148*  BUN 58* 60*  CREATININE 2.65* 2.58*    CBC:  Recent Labs  03/30/13 1815 03/31/13 0445  WBC 5.5 4.1  HGB 10.7* 9.4*  HCT 33.9* 30.2*  MCV 98.0 98.7  PLT 232 195    BNP    Component Value Date/Time   PROBNP 1353.0* 03/30/2013 1815    PROTIME: Lab Results  Component Value Date   INR 2.53* 04/01/2013   INR 2.77* 03/31/2013   INR 2.29* 03/30/2013    Telemetry:  Atrial paced ventricular sensed, occasional PVCs  Assessment/Plan:  1. Severe hypokalemia due to  diuresis 2. Polymorphic ventricular tachycardia due to hypokalemia 3. Nonischemic cardiomyopathy 4.. Previous mitral valve repair  Recommendations:  Continue to replete potassium. It has been very difficult to weigh her at home and would like for physical therapy to work to see if they can get her to stand on scales at home. I will ask for the daughter  to bring in the scales that she uses that they can work with the patient. The other possibility would be to see if the Optivol function of the pacemaker may help predict fluid gain.     [name]  MD [facility]  04/01/2013, 9:42 AM

## 2013-04-01 NOTE — Progress Notes (Signed)
Patient: Margaret [name] Date of Encounter: 04/01/2013, 10:41 AM Admit date: 03/30/2013     Subjective  Ms. Bastyr reports "I'm feeling better." She denies CP or SOB.    Objective  Physical Exam: Vitals: BP 110/72  Pulse 70  Temp(Src) 97.6 F (36.4 C) (Oral)  Resp 18  Ht 5\' 5"  (1.651 m)  Wt 231 lb (104.781 kg)  BMI 38.44 kg/m2  SpO2 96% General: Well developed22 year old female in no acute distress. Neck: Supple. No JVD. Lungs: Clear bilaterally to auscultation without wheezes, rales, or rhonchi. Breathing is unlabored. Heart: RRR S1 S2 present with II/VI systolic murmur. No rub or gallop.  Abdomen: Soft, non-distended. Extremities: No clubbing or cyanosis. 1+ LE edema bilaterally.   Neuro: Alert and oriented X 3. Moves all extremities spontaneously. No focal deficits.  Intake/Output:  Intake/Output Summary (Last 24 hours) at 04/01/13 1041 Last data filed at 04/01/13 0700  Gross per 24 hour  Intake    360 ml  Output      0 ml  Net    360 ml    Inpatient Medications:  . amiodarone  200 mg Oral Daily  . docusate sodium  100 mg Oral BID  . furosemide  80 mg Intravenous BID  . levothyroxine  50 mcg Oral QAC breakfast  . loratadine  10 mg Oral Daily  . metoprolol tartrate  12.5 mg Oral BID  . potassium chloride  40 mEq Oral BID  . simvastatin  20 mg Oral q1800  . sodium chloride  3 mL Intravenous Q12H  . sodium chloride  3 mL Intravenous Q12H  . warfarin  2 mg Oral q1800  . Warfarin - Pharmacist Dosing Inpatient   Does not apply q1800    Labs:  Recent Labs  03/31/13 0445 03/31/13 1410 04/01/13 0651  NA 142 137  --   K 2.5* 3.2*  --   CL 95* 95*  --   CO2 33* 32  --   GLUCOSE 100* 148*  --   BUN 58* 60*  --   CREATININE 2.65* 2.58*  --   CALCIUM 8.3* 8.2*  --   MG 2.6* 2.6* 2.9*  PHOS 4.3  --   --     Recent Labs  03/31/13 0445  AST 37  ALT 21  ALKPHOS 76  BILITOT 0.9  PROT 6.5  ALBUMIN 3.1*    Recent Labs  03/30/13 1815 03/31/13 0445    WBC 5.5 4.1  HGB 10.7* 9.4*  HCT 33.9* 30.2*  MCV 98.0 98.7  PLT 232 195    Recent Labs  03/31/13 0005 03/31/13 0448 03/31/13 1410  TROPONINI <0.30 <0.30 <0.30    Recent Labs  03/31/13 0445  TSH 12.344*    Recent Labs  03/31/13 0445  VITAMINB12 443  FOLATE >20.0  FERRITIN 81  TIBC 327  IRON 60  RETICCTPCT 2.9    Recent Labs  04/01/13 0651  INR 2.53*    Radiology/Studies: Dg Chest 2 View 03/30/2013    IMPRESSION: No active cardiopulmonary disease.    Electronically Signed   By: Signa Kell M.D.   On: 03/30/2013 19:01   Echocardiogram Dec 2013 Study Conclusions - Left ventricle: The cavity size was mildly dilated. Systolic function was moderately reduced. Moderate diffuse hypokinesis. Although no diagnostic regional wall motion abnormality was identified, this possibility cannot be completely excluded on the basis of this study. - Mitral valve: Prior procedures included surgical repair. Mild regurgitation. Valve area by pressure half-time:  1.85cm^2. Valve area by continuity equation (using LVOT flow): 0.82cm^2. - Left atrium: The atrium was moderately to severely dilated. - Right ventricle: The cavity size was dilated. Wall thickness was normal. - Right atrium: The atrium was moderately to severely dilated. - Pulmonary arteries: Systolic pressure was moderately increased. PA peak pressure: 39mm Hg (S).  Telemetry: A paced V sensed currently; occasional PVCs; no further VT   Assessment and Plan  1. Polymorphic ventricular tachycardia  - in setting of profound hypokalemia 2. Hypokalemia  - needs aggressive repletion with close watch over renal function  3. Acute on chronic systolic heart failure  - improving - lasix changed to twice daily yesterday 4. Nonischemic cardiomyopathy  5. Aborted cardiac arrest status post ICD with subsequent explantation of a precordial device secondary to erosion and implantation of an abdominal device with  residual left ventricular lead not yet in utilized  6. Significant conduction system disease - first degree AV block and RBBB 7. Stage IV CKD - aldactone and ACEI discontinued 8. Paroxysmal atrial fibrillation - maintaining SR on amiodarone   Dr. Graciela Husbands to see Signed, Rick Duff PA-C

## 2013-04-01 NOTE — Progress Notes (Signed)
[facility]                                                                                Patient Demographics  Margaret [name], is a 72 y.o. female, DOB - May 29, 1940, ZOX:[id number]  Admit date - 03/30/2013   Admitting Physician Therisa Doyne, MD  Outpatient Primary MD for the patient is Allean Found, MD  LOS - 2   Chief Complaint  Patient presents with  . AICD Problem        Assessment & Plan    1. Syncope. Firing of AICD. Likely due to V. tach from severe hypokalemia the patient with history of paroxysmal ventricular tachycardia and atrial fibrillation with Medtronic AICD- last echo does not comment on the ejection fraction per se, some levels were stable after repletion we will continue to monitor, her magnesium levels are stable, continue beta blocker, telemetry monitoring, she symptom-free. Have consulted cardiology. She will require AICD interrogation.    2. History of nonischemic cardiomyopathy EF around 35% in 2012 with Medtronic AICD - Currently in acute on chronic systolic CHF with massive fluid overload. Discussed the case with cardiology will see the patient, placed on IV Lasix along with potassium, continue beta blocker, cardiology has discontinued ACE inhibitor along with Aldactone . Continue Fluid and salt restriction.    3. Hypokalemia. Replaced and monitor. Magnesium stable.    4. Hypertension. Continue on beta blocker.    5. Dyslipidemia continue on statin    6. History of atrial fibrillation. Currently on amiodarone and Coumadin continue. Pharmacy monitoring Coumadin. Telemetry monitoring. Currently in paced rhythm.    7. Chronic lower extremity weakness. She is bedbound for the last 6 months to one year. Attributes that to peripheral neuropathy and deconditioning.     Code Status: Full  Family Communication: none present  Disposition Plan: Home   Procedures     Consults  Cards   Medications  Scheduled Meds: .  amiodarone  200 mg Oral Daily  . docusate sodium  100 mg Oral BID  . furosemide  80 mg Intravenous BID  . levothyroxine  50 mcg Oral QAC breakfast  . loratadine  10 mg Oral Daily  . metoprolol tartrate  12.5 mg Oral BID  . potassium chloride  40 mEq Oral BID  . simvastatin  20 mg Oral q1800  . sodium chloride  3 mL Intravenous Q12H  . sodium chloride  3 mL Intravenous Q12H  . warfarin  2 mg Oral q1800  . Warfarin - Pharmacist Dosing Inpatient   Does not apply q1800   Continuous Infusions:   PRN Meds:.sodium chloride, acetaminophen, HYDROcodone-acetaminophen, ondansetron (ZOFRAN) IV, sodium chloride, zolpidem  DVT Prophylaxis  Coumadin  Lab Results  Component Value Date   INR 2.53* 04/01/2013   INR 2.77* 03/31/2013   INR 2.29* 03/30/2013     Lab Results  Component Value Date   PLT 195 03/31/2013    Antibiotics     Anti-infectives   None          Subjective:   Margaret [name] today has, No headache, No chest pain, No abdominal pain - No Nausea, No new weakness tingling or numbness, No Cough - SOB.  Objective:   Filed Vitals:   03/31/13 0526 03/31/13 1345 03/31/13 2012 04/01/13 0549  BP: 111/70 107/64 116/75 110/72  Pulse: 70 69 85 70  Temp: 98.2 F (36.8 C) 98.2 F (36.8 C) 97.5 F (36.4 C) 97.6 F (36.4 C)  TempSrc: Oral Oral Oral Oral  Resp: 17 16 18 18   Height:      Weight: 103 kg (227 lb 1.2 oz)   104.781 kg (231 lb)  SpO2: 93% 94% 95% 96%    Wt Readings from Last 3 Encounters:  04/01/13 104.781 kg (231 lb)  05/31/12 93.895 kg (207 lb)  03/24/12 90.719 kg (200 lb)     Intake/Output Summary (Last 24 hours) at 04/01/13 1023 Last data filed at 04/01/13 0700  Gross per 24 hour  Intake    360 ml  Output      0 ml  Net    360 ml    Exam Awake Alert, Oriented X 3, No new F.N deficits, Normal affect [place].AT,PERRAL Supple Neck,No JVD, No cervical lymphadenopathy appriciated.  Symmetrical Chest wall movement, Good air movement bilaterally,  CTAB RRR,No Gallops,Rubs or new Murmurs, No Parasternal Heave +ve B.Sounds, Abd Soft, Non tender, No organomegaly appriciated, No rebound - guarding or rigidity. No Cyanosis, Clubbing or edema, No new Rash or bruise , 3+ edema mild redness due to edema   Data Review   Micro Results Recent Results (from the past 240 hour(s))  URINE CULTURE     Status: None   Collection Time    03/30/13 11:26 PM      Result Value Range Status   Specimen Description URINE, RANDOM   Final   Special Requests CX ADDED AT 2342 ON 161096   Final   Culture  Setup Time     Final   Value: 03/30/2013 23:55     Performed at [facility]   Colony Count     Final   Value: >=100,000 COLONIES/ML     Performed at [facility]   Culture     Final   Value: GRAM NEGATIVE RODS     Performed at [facility]   Report Status PENDING   Incomplete    Radiology Reports Dg Chest 2 View  03/30/2013   CLINICAL DATA:  Syncope.  EXAM: CHEST  2 VIEW  COMPARISON:  03/02/2013  FINDINGS: The patient status post median sternotomy and CABG procedure. Moderate cardiac enlargement. There is a defibrillator battery pack in the projection of the left upper quadrant of the abdomen. Disc tibial 8 air wires appear similar to the previous exam. No pleural effusion or edema. Both lungs are clear. The visualized skeletal structures are unremarkable.  IMPRESSION: No active cardiopulmonary disease.   Electronically Signed   By: Signa Kell M.D.   On: 03/30/2013 19:01   Dg Chest 2 View  03/02/2013   CLINICAL DATA:  Arm swelling  EXAM: CHEST  2 VIEW  COMPARISON:  Prior radiograph from 06/09/2012  FINDINGS: Median sternotomy wires with underlying hardware is unchanged. Cardiomegaly is stable.  Lungs are normally inflated. There is no overt pulmonary edema. No focal infiltrate. No pneumothorax. No pleural effusion is identified.  Multilevel degenerative changes are seen within the visualized spine with accentuation of the  normal thoracic kyphosis. No acute osseous abnormality.  IMPRESSION: No active cardiopulmonary disease.   Electronically Signed   By: Rise Mu M.D.   On: 03/02/2013 23:27    CBC  Recent Labs Lab 03/30/13 1815 03/31/13 0445  WBC 5.5 4.1  HGB 10.7* 9.4*  HCT 33.9* 30.2*  PLT 232 195  MCV 98.0 98.7  MCH 30.9 30.7  MCHC 31.6 31.1  RDW 15.0 15.2    Chemistries   Recent Labs Lab 03/30/13 1815 03/31/13 0445 03/31/13 1410 04/01/13 0651  NA 139 142 137  --   K 2.2* 2.5* 3.2*  --   CL 88* 95* 95*  --   CO2 33* 33* 32  --   GLUCOSE 126* 100* 148*  --   BUN 59* 58* 60*  --   CREATININE 2.67* 2.65* 2.58*  --   CALCIUM 8.9 8.3* 8.2*  --   MG  --  2.6* 2.6* 2.9*  AST  --  37  --   --   ALT  --  21  --   --   ALKPHOS  --  76  --   --   BILITOT  --  0.9  --   --    ------------------------------------------------------------------------------------------------------------------ estimated creatinine clearance is 23.3 ml/min (by C-G formula based on Cr of 2.58). ------------------------------------------------------------------------------------------------------------------ No results found for this basename: HGBA1C,  in the last 72 hours ------------------------------------------------------------------------------------------------------------------ No results found for this basename: CHOL, HDL, LDLCALC, TRIG, CHOLHDL, LDLDIRECT,  in the last 72 hours ------------------------------------------------------------------------------------------------------------------  Recent Labs  03/31/13 0445  TSH 12.344*   ------------------------------------------------------------------------------------------------------------------  Recent Labs  03/31/13 0445  VITAMINB12 443  FOLATE >20.0  FERRITIN 81  TIBC 327  IRON 60  RETICCTPCT 2.9    Coagulation profile  Recent Labs Lab 03/30/13 2132 03/31/13 0445 04/01/13 0651  INR 2.29* 2.77* 2.53*    No results  found for this basename: DDIMER,  in the last 72 hours  Cardiac Enzymes  Recent Labs Lab 03/31/13 0005 03/31/13 0448 03/31/13 1410  TROPONINI <0.30 <0.30 <0.30   ------------------------------------------------------------------------------------------------------------------ No components found with this basename: POCBNP,      Time Spent in minutes  35   Susa Raring K M.D on 04/01/2013 at 10:23 AM  Between 7am to 7pm - Pager - [phone]  After 7pm go to [email or web address] - password [email or web address]  And look for the night coverage person covering for me after hours  [facility] Office  [phone]

## 2013-04-01 NOTE — Progress Notes (Signed)
PHARMACY CONSULT NOTE   Pharmacy Consult for :  Coumadin Indication: atrial fibrillation  Dosing weight 103 kg  Labs:  Recent Labs  03/30/13 1815 03/30/13 2132 03/31/13 0445 03/31/13 1410 04/01/13 0651  HGB 10.7*  --  9.4*  --   --   HCT 33.9*  --  30.2*  --   --   PLT 232  --  195  --   --   INR  --  2.29* 2.77*  --  2.53*  CREATININE 2.67*  --  2.65* 2.58*  --    Estimated Creatinine Clearance: 23.3 ml/min (by C-G formula based on Cr of 2.58).   Assessment:  72 y/o female continuing on chronic Coumadin for atrial fibrillation  INR therapeutic on home dose, INR 2.53/.  No bleeding complications noted    Goal of Therapy:   INR 2-3   Plan:  1. Continue home dose of Coumadin 2 mg daily. Change daily INR to MWF INR. Herby Abraham, Pharm.D. [phone] 04/01/2013 10:29 AM

## 2013-04-02 LAB — URINE CULTURE: Colony Count: >=100000

## 2013-04-02 LAB — BASIC METABOLIC PANEL
BUN: 57 mg/dL — ABNORMAL HIGH (ref 6–23)
CO2: 30 mEq/L (ref 19–32)
Chloride: 97 mEq/L (ref 96–112)
Creatinine, Ser: 2.31 mg/dL — ABNORMAL HIGH (ref 0.50–1.10)
GFR calc Af Amer: 23 mL/min — ABNORMAL LOW (ref >90)
Potassium: 3.1 mEq/L — ABNORMAL LOW (ref 3.5–5.1)

## 2013-04-02 MED ORDER — POTASSIUM CHLORIDE CRYS ER 20 MEQ PO TBCR: 40.0000 meq | EXTENDED_RELEASE_TABLET | Freq: Two times a day (BID) | ORAL | Status: DC

## 2013-04-02 MED ORDER — CIPROFLOXACIN HCL 500 MG PO TABS
500.0000 mg | ORAL_TABLET | Freq: Every day | ORAL | Status: DC
  Administered 2013-04-02: 500 mg via ORAL
  Filled 2013-04-02 (×2): qty 1

## 2013-04-02 MED ORDER — CIPROFLOXACIN HCL 500 MG PO TABS: 500.0000 mg | ORAL_TABLET | Freq: Every day | ORAL | Status: DC

## 2013-04-02 MED ORDER — FUROSEMIDE 80 MG PO TABS
80.0000 mg | ORAL_TABLET | Freq: Two times a day (BID) | ORAL | Status: DC
  Filled 2013-04-02 (×3): qty 1

## 2013-04-02 MED ORDER — POTASSIUM CHLORIDE CRYS ER 20 MEQ PO TBCR
40.0000 meq | EXTENDED_RELEASE_TABLET | Freq: Once | ORAL | Status: AC
  Administered 2013-04-02: 40 meq via ORAL
  Filled 2013-04-02: qty 2

## 2013-04-02 NOTE — Discharge Summary (Signed)
[facility]                                                                                   PAETYN PIETRZAK, is a 72 y.o. female  DOB 14-Aug-1940  MRN [id number].  Admission date:  03/30/2013  Admitting Physician  Therisa Doyne, MD  Discharge Date:  04/02/2013   Primary MD  Allean Found, MD  Recommendations for primary care physician for things to follow:   Kindly monitor BMP and INR closely adjust Coumadin and diuretic dose as needed   Admission Diagnosis  Atrial fibrillation [427.31] Hypokalemia [276.8] Cardiomyopathy, nonischemic [425.4] Paroxysmal ventricular fibrillation [427.41]  Discharge Diagnosis  polymorphic ventricular tachycardia due to severe hypokalemia  Active Problems:   ICD-dual-chamber-Medtronic   Atrial fibrillation   CKD (chronic kidney disease), stage III   Hypertension   Cardiomyopathy, nonischemic   Long-term (current) use of anticoagulants   Paroxysmal ventricular fibrillation   Hypokalemia   Cardiac syncope      Past Medical History  Diagnosis Date  . NICM (nonischemic cardiomyopathy)     EF 35%; cath 2/12 no CAD  . Systolic CHF, chronic   . Severe mitral regurgitation     s/p complex mitral valve repair with cox maze + LAA clipping, removal of RV lead, and implantation of CRT-D in abdomen  . Cardiac arrest - ventricular fibrillation     in 1990s  . Atrial fibrillation     amiodarone  . HTN (hypertension)   . HLD (hyperlipidemia)   . Achalasia     s/p dilation  . Recurrent UTI   . Obesity   . Arthritis   . Anoxic brain injury 1996    s/p cardiac arrest     Past Surgical History  Procedure Laterality Date  . Defibrillator generator explantation and reimplantation; and  08/02/2001  . Device migration with anticipated pocket revision  10/29/2003  . 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 Attempted implantation of an implantable cardioverter-  12/30/2004  . Cardioversion   04/14/2010  . Median sternotomy  07/16/2010  . Mitral valve repair  07/16/2010  . Cox maze procedure (complete biatrial lesion set with clipping of  07/16/2010  . Removal of old endocardial right ventricular defibrillator lead.  07/16/2010  . Placement of dual chamber pacemaker and implantable cardiac  07/16/2010  . Placement of swan ganz pulmonary artery catheter via left femoral access.  07/16/2010     Discharge Condition: Stable   Follow-up Information   Follow up with Allean Found, MD. Schedule an appointment as soon as possible for a visit in 2 days. (BMP and INR rechecked in 2 and 5 days)    Specialty:  Family Medicine   Contact information:   [address], [address] [place] [address] [phone]       Follow up with [facility]. Schedule an appointment as soon as possible for a visit in 5 days.   Contact information:   [address] [place] [address] [phone]        Consults obtained - ideology   Discharge Medications  Medication List         ALIGN PO  Take 1 tablet by mouth daily.     amiodarone 200 MG tablet  Commonly known as:  PACERONE  Take 200 mg by mouth daily.     calcium carbonate 500 MG chewable tablet  Commonly known as:  TUMS - dosed in mg elemental calcium  Chew 2 tablets by mouth daily. Flavored TUMS     ciprofloxacin 500 MG tablet  Commonly known as:  CIPRO  Take 1 tablet (500 mg total) by mouth daily with breakfast.     desloratadine 5 MG tablet  Commonly known as:  CLARINEX  Take 5 mg by mouth daily.     fluticasone 50 MCG/ACT nasal spray  Commonly known as:  FLONASE  Place 1 drop into both nostrils at bedtime.     folic acid 1 MG tablet  Commonly known as:  FOLVITE  Take 1 tablet by mouth daily.     furosemide 80 MG tablet  Commonly known as:  LASIX  Take 80 mg by mouth 2 (two) times daily.     levothyroxine 50 MCG tablet  Commonly known as:  SYNTHROID,  LEVOTHROID  Take 50 mcg by mouth daily before breakfast.     lovastatin 40 MG tablet  Commonly known as:  MEVACOR  Take 40 mg by mouth daily.     metoprolol tartrate 25 MG tablet  Commonly known as:  LOPRESSOR  Take 12.5 mg by mouth Twice daily.     OVER THE COUNTER MEDICATION  Take 1 capsule by mouth at bedtime. Equate sleep aid (blue capsule)     potassium chloride SA 20 MEQ tablet  Commonly known as:  K-DUR,KLOR-CON  Take 2 tablets (40 mEq total) by mouth 2 (two) times daily.     warfarin 1 MG tablet  Commonly known as:  COUMADIN  Take 2 mg by mouth every evening.         Diet and Activity recommendation: See Discharge Instructions below   Discharge Instructions     Follow with Primary MD Allean Found, MD in 2 days   Get CBC, BMP, INR checked 2 days by Primary MD and again in 5 days. Please get a BMP monitored closely and diuretic medications adjusted.  Get Medicines reviewed and adjusted.  Please request your Prim.MD to go over all Hospital Tests and Procedure/Radiological results at the follow up, please get all Hospital records sent to your Prim MD by signing hospital release before you go home.  Activity: As tolerated with Full fall precautions use walker/cane & assistance as needed   Diet:  Heart Healthy,  Fluid restriction 1.5 lit/day, Aspiration precautions.  Check your Weight same time everyday, if you gain over 2 pounds, or you develop in leg swelling, experience more shortness of breath or chest pain, call your Primary MD immediately. Follow Cardiac Low Salt Diet and 1.5 lit/day fluid restriction.  Disposition Home    If you experience worsening of your admission symptoms, develop shortness of breath, life threatening emergency, suicidal or homicidal thoughts you must seek medical attention immediately by calling 911 or calling your MD immediately  if symptoms less severe.  You Must read complete instructions/literature along with all the  possible adverse reactions/side effects for all the Medicines you take and that have been prescribed to you. Take any new Medicines after you have completely understood and accpet all the possible adverse reactions/side effects.   Do not drive and provide baby sitting services  if your were admitted for syncope or siezures until you have seen by Primary MD or a Neurologist and advised to do so again.  Do not drive when taking Pain medications.    Do not take more than prescribed Pain, Sleep and Anxiety Medications  Special Instructions: If you have smoked or chewed Tobacco  in the last 2 yrs please stop smoking, stop any regular Alcohol  and or any Recreational drug use.  Wear Seat belts while driving.   Please note  You were cared for by a hospitalist during your hospital stay. If you have any questions about your discharge medications or the care you received while you were in the hospital after you are discharged, you can call the unit and asked to speak with the hospitalist on call if the hospitalist that took care of you is not available. Once you are discharged, your primary care physician will handle any further medical issues. Please note that NO REFILLS for any discharge medications will be authorized once you are discharged, as it is imperative that you return to your primary care physician (or establish a relationship with a primary care physician if you do not have one) for your aftercare needs so that they can reassess your need for medications and monitor your lab values.    Major procedures and Radiology Reports - PLEASE review detailed and final reports for all details, in brief -       Dg Chest 2 View  03/30/2013   CLINICAL DATA:  Syncope.  EXAM: CHEST  2 VIEW  COMPARISON:  03/02/2013  FINDINGS: The patient status post median sternotomy and CABG procedure. Moderate cardiac enlargement. There is a defibrillator battery pack in the projection of the left upper quadrant of the  abdomen. Disc tibial 8 air wires appear similar to the previous exam. No pleural effusion or edema. Both lungs are clear. The visualized skeletal structures are unremarkable.  IMPRESSION: No active cardiopulmonary disease.   Electronically Signed   By: Signa Kell M.D.   On: 03/30/2013 19:01    Micro Results      Recent Results (from the past 240 hour(s))  URINE CULTURE     Status: None   Collection Time    03/30/13 11:26 PM      Result Value Range Status   Specimen Description URINE, RANDOM   Final   Special Requests CX ADDED AT 2342 ON 161096   Final   Culture  Setup Time     Final   Value: 03/30/2013 23:55     Performed at [facility]   Colony Count     Final   Value: >=100,000 COLONIES/ML     Performed at [facility]   Culture     Final   Value: CITROBACTER FREUNDII     Performed at [facility]   Report Status 04/02/2013 FINAL   Final   Organism ID, Bacteria CITROBACTER FREUNDII   Final     History of present illness and  Hospital Course:      Kindly see H&P for history of present illness and admission details, please review complete Labs, Consult reports and Test reports for all details in brief Margaret [name], is a 72 y.o. female, patient with history of chronic systolic heart failure with EF around 35%, nonischemic cardiomyopathy, AICD placement, hypertension, CKD stage IV, chronic generalized weakness has been bed bound for 6 months, history of atrial fibrillation on Coumadin.    Patient with above history  was admitted to the hospital after she had a syncopal episode secondary to polymorphic ventricular tachycardia which caused her AICD to discharge, when she came to the hospital her potassium was extremely low at 2.2 with stable magnesium levels, her potassium was aggressively replaced to normal levels, she was seen by cardiology, no further cardiac workup was initiated.    She did have mild chronic systolic heart failure decompensation  she is known to have a baseline EF of 35% requiring an AICD placement. Patient was having issues with fluid retention prior to admission and hence her diuretics were changed briefly to 80 mg IV Lasix twice a day along with ongoing potassium repletion, she is diuresed well her lower extremity has considerably improved, she will be discharged on 80 mg twice a day of oral Lasix along with 40 mg twice a day of potassium. Have given her written instructions on salt and fluid restriction, home physical therapy and RN for weight check will be ordered. Patient has been requested to follow with her primary care physician in 2 days and then again in 5 days to get BMP rechecked and to make sure her diuretics dose be adjusted as needed. She will also follow with her primary cardiologist post discharge. Note cardiology did not recommend ACE inhibitor or ARB this admission. With close monitoring of her renal function ACE/ARB can be considered in the outpatient setting by primary cardiology team.   She has underlying atrial fibrillation for which she is on amiodarone along with Coumadin which will be continued unchanged.    She had mild UTI with Citrobacter which was sensitive to Cipro, we have adjusted her Cipro per her renal function and she will get 3 days of oral Cipro 500 mg once a day.    She has chronic kidney disease stage IV baseline creatinine around 2.5. With diuresis her renal function actually improved due to better compensation. We'll request PCP to continue monitoring BMP and potassium in the light of ongoing diuresis. His Lasix and potassium dose.     For her generalized weakness and bedbound status home PT has been ordered along with home RN.     Today   Subjective:   Aleyza Salmi today has no headache,no chest abdominal pain,no new weakness tingling or numbness, feels much better wants to go home today.    Objective:   Blood pressure 109/42, pulse 65, temperature 97.9 F (36.6 C),  temperature source Oral, resp. rate 18, height 5\' 5"  (1.651 m), weight 104.2 kg (229 lb 11.5 oz), SpO2 98.00%.   Intake/Output Summary (Last 24 hours) at 04/02/13 1032 Last data filed at 04/01/13 1700  Gross per 24 hour  Intake    240 ml  Output    800 ml  Net   -560 ml    Exam Awake Alert, Oriented *3, No new F.N deficits, Normal affect [place].AT,PERRAL Supple Neck,No JVD, No cervical lymphadenopathy appriciated.  Symmetrical Chest wall movement, Good air movement bilaterally, CTAB RRR,No Gallops,Rubs or new Murmurs, No Parasternal Heave +ve B.Sounds, Abd Soft, Non tender, No organomegaly appriciated, No rebound -guarding or rigidity. No Cyanosis, Clubbing or edema, No new Rash or bruise, 1+ edema in legs  Data Review   CBC w Diff: Lab Results  Component Value Date   WBC 4.1 03/31/2013   HGB 9.4* 03/31/2013   HCT 30.2* 03/31/2013   PLT 195 03/31/2013   LYMPHOPCT 25 03/02/2013   MONOPCT 12 03/02/2013   EOSPCT 2 03/02/2013   BASOPCT 0 03/02/2013  CMP: Lab Results  Component Value Date   NA 139 04/02/2013   K 3.1* 04/02/2013   CL 97 04/02/2013   CO2 30 04/02/2013   BUN 57* 04/02/2013   CREATININE 2.31* 04/02/2013   PROT 6.5 03/31/2013   ALBUMIN 3.1* 03/31/2013   BILITOT 0.9 03/31/2013   ALKPHOS 76 03/31/2013   AST 37 03/31/2013   ALT 21 03/31/2013  .  Lab Results  Component Value Date   INR 2.53* 04/01/2013   INR 2.77* 03/31/2013   INR 2.29* 03/30/2013    Total Time in preparing paper work, data evaluation and todays exam - 35 minutes  Leroy Sea M.D on 04/02/2013 at 10:32 AM  [facility] Office  [phone]

## 2013-04-02 NOTE — Progress Notes (Addendum)
Subjective:  Still hypokalemic but better.  Weights fluctuating.  Not SOB.  No chest pain.   Objective:  Vital Signs in the last 24 hours: BP 109/42  Pulse 65  Temp(Src) 97.9 F (36.6 C) (Oral)  Resp 18  Ht 5\' 5"  (1.651 m)  Wt 104.2 kg (229 lb 11.5 oz)  BMI 38.23 kg/m2  SpO2 98%  Physical Exam: Pleasant obese female in no acute distress Lungs:  Clear  Cardiac:  Regular rhythm, normal S1 and S2, no S3 Abdomen:  Soft, nontender, no masses Extremities:  1+ edema present  Intake/Output from previous day: 12/08 0701 - 12/09 0700 In: 240 [P.O.:240] Out: 800 [Urine:800] Weight Filed Weights   03/31/13 0526 04/01/13 0549 04/02/13 0553  Weight: 103 kg (227 lb 1.2 oz) 104.781 kg (231 lb) 104.2 kg (229 lb 11.5 oz)    Lab Results: Basic Metabolic Panel:  Recent Labs  09/81/19 1019 04/02/13 0430  NA 136 139  K 3.7 3.1*  CL 96 97  CO2 28 30  GLUCOSE 119* 95  BUN 57* 57*  CREATININE 2.42* 2.31*    CBC:  Recent Labs  03/30/13 1815 03/31/13 0445  WBC 5.5 4.1  HGB 10.7* 9.4*  HCT 33.9* 30.2*  MCV 98.0 98.7  PLT 232 195    BNP    Component Value Date/Time   PROBNP 1353.0* 03/30/2013 1815    PROTIME: Lab Results  Component Value Date   INR 2.53* 04/01/2013   INR 2.77* 03/31/2013   INR 2.29* 03/30/2013    Telemetry:  Atrial paced ventricular sensed, occasional PVCs  Assessment/Plan:  1. Severe hypokalemia due to  diuresis 2. Polymorphic ventricular tachycardia due to hypokalemia 3. Nonischemic cardiomyopathy 4.. Previous mitral valve repair  Recommendations:  Extra potassium today.  WIll need home PT to work with them about getting weights.  Would also like for [facility] to work with her about CHF management at home.  Her last ECHO was recently and showed EF of 35% and stable mitral valve repair. Dr. Odessa Fleming note mentioned aldactone and lisinopril.  She was not taking this prior to admission.  Change to oral lasix.   [name]  MD  [facility]  04/02/2013, 8:45 AM

## 2013-04-02 NOTE — Progress Notes (Signed)
Assessment unchanged. Discussed D/C instructions with pt and family including new medications, medication changes, and f/u appointments. Verbalized understanding. IV and tele removed. Pt left via W/C accompanied by [employer] with belongings.

## 2013-04-08 ENCOUNTER — Encounter: Payer: Self-pay | Admitting: Cardiology

## 2013-06-06 ENCOUNTER — Encounter: Payer: Self-pay | Admitting: Internal Medicine

## 2013-06-06 ENCOUNTER — Ambulatory Visit (INDEPENDENT_AMBULATORY_CARE_PROVIDER_SITE_OTHER): Payer: Medicare Other | Admitting: Internal Medicine

## 2013-06-06 VITALS — BP 124/72 | HR 71 | Ht 65.0 in | Wt 221.0 lb

## 2013-06-06 DIAGNOSIS — I428 Other cardiomyopathies: Secondary | ICD-10-CM

## 2013-06-06 DIAGNOSIS — Z8674 Personal history of sudden cardiac arrest: Secondary | ICD-10-CM

## 2013-06-06 DIAGNOSIS — I4891 Unspecified atrial fibrillation: Secondary | ICD-10-CM

## 2013-06-06 DIAGNOSIS — Z95810 Presence of automatic (implantable) cardiac defibrillator: Secondary | ICD-10-CM

## 2013-06-06 DIAGNOSIS — I5022 Chronic systolic (congestive) heart failure: Secondary | ICD-10-CM

## 2013-06-06 LAB — MDC_IDC_ENUM_SESS_TYPE_INCLINIC
Battery Voltage: 3.01 V
Brady Statistic AP VP Percent: 8.84 %
Brady Statistic AP VS Percent: 87.86 %
Brady Statistic AS VP Percent: 1.03 %
Brady Statistic RV Percent Paced: 9.87 %
HIGH POWER IMPEDANCE MEASURED VALUE: 361 Ohm
HIGH POWER IMPEDANCE MEASURED VALUE: 361 Ohm
HIGH POWER IMPEDANCE MEASURED VALUE: 44 Ohm
HighPow Impedance: 40 Ohm
Lead Channel Impedance Value: 646 Ohm
Lead Channel Pacing Threshold Amplitude: 1.125 V
Lead Channel Pacing Threshold Pulse Width: 0.8 ms
Lead Channel Sensing Intrinsic Amplitude: 0.75 mV
Lead Channel Sensing Intrinsic Amplitude: 11.5 mV
Lead Channel Setting Pacing Amplitude: 2.5 V
MDC IDC MSMT LEADCHNL RA IMPEDANCE VALUE: 551 Ohm
MDC IDC MSMT LEADCHNL RA PACING THRESHOLD PULSEWIDTH: 0.4 ms
MDC IDC MSMT LEADCHNL RV PACING THRESHOLD AMPLITUDE: 1.5 V
MDC IDC SESS DTM: 20150212111049
MDC IDC SET LEADCHNL RV PACING AMPLITUDE: 3 V
MDC IDC SET LEADCHNL RV PACING PULSEWIDTH: 0.8 ms
MDC IDC SET LEADCHNL RV SENSING SENSITIVITY: 0.45 mV
MDC IDC SET ZONE DETECTION INTERVAL: 350 ms
MDC IDC SET ZONE DETECTION INTERVAL: 370 ms
MDC IDC STAT BRADY AS VS PERCENT: 2.27 %
MDC IDC STAT BRADY RA PERCENT PACED: 96.7 %
Zone Setting Detection Interval: 320 ms
Zone Setting Detection Interval: 360 ms

## 2013-06-06 NOTE — Assessment & Plan Note (Signed)
The patient's device was interrogated.  The information was reviewed. No changes were made in the programming.    

## 2013-06-06 NOTE — Progress Notes (Signed)
HPI  Margaret [name] is a 73 y.o. female  Seen in followup for aborted cardiac arrest status post ICD implantation.     In March 2012 she was admitted to hospital with severe mitral regurgitation and was subjected to intraoperative repair at which time a CRT defibrillator system With epicardial patches was implanted by Dr. Cornelius Moras. Because of a narrow QRS, the LV leads were not attached.As she had a history of paroxysmal atrial fibrillation she also underwent a Cox-Maze procedure.    She has had a history of atrial flutters resulting in ICD shocks inappropriately.  We were also able to terminated with pacing. She also had worsening of her left ventricular function that was thought to be possibly rate related. Normalization however did not occur an echo 2013-December demonstrated EF of 35%  She saw Dr. Donnie Aho recently and will see him again in just a few weeks. There has been extensive problems with peripheral edema that she treats with diuresis and keeping her legs elevated as well as some isometric contraction    Past Medical History  Diagnosis Date  . NICM (nonischemic cardiomyopathy)     EF 35%; cath 2/12 no CAD  . Systolic CHF, chronic   . Severe mitral regurgitation     s/p complex mitral valve repair with cox maze + LAA clipping, removal of RV lead, and implantation of CRT-D in abdomen  . Cardiac arrest - ventricular fibrillation     in 1990s  . Atrial fibrillation     amiodarone  . HTN (hypertension)   . HLD (hyperlipidemia)   . Achalasia     s/p dilation  . Recurrent UTI   . Obesity   . Arthritis   . Anoxic brain injury 1996    s/p cardiac arrest     Past Surgical History  Procedure Laterality Date  . Defibrillator generator explantation and reimplantation; and  08/02/2001  . Device migration with anticipated pocket revision  10/29/2003  . 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 Attempted implantation of an implantable  cardioverter-  12/30/2004  . Cardioversion  04/14/2010  . Median sternotomy  07/16/2010  . Mitral valve repair  07/16/2010  . Cox maze procedure (complete biatrial lesion set with clipping of  07/16/2010  . Removal of old endocardial right ventricular defibrillator lead.  07/16/2010  . Placement of dual chamber pacemaker and implantable cardiac  07/16/2010  . Placement of swan ganz pulmonary artery catheter via left femoral access.  07/16/2010    Current Outpatient Prescriptions  Medication Sig Dispense Refill  . amiodarone (PACERONE) 200 MG tablet Take 200 mg by mouth daily.       . calcium carbonate (TUMS - DOSED IN MG ELEMENTAL CALCIUM) 500 MG chewable tablet Chew 2 tablets by mouth daily. Flavored TUMS      . desloratadine (CLARINEX) 5 MG tablet Take 5 mg by mouth daily.      . fluticasone (FLONASE) 50 MCG/ACT nasal spray Place 1 drop into both nostrils at bedtime.       . folic acid (FOLVITE) 1 MG tablet Take 1 tablet by mouth daily.      . furosemide (LASIX) 80 MG tablet Take 80 mg by mouth 2 (two) times daily.      Marland Kitchen levothyroxine (SYNTHROID, LEVOTHROID) 50 MCG tablet Take 50 mcg by mouth daily before breakfast.      . lovastatin (MEVACOR) 40 MG tablet Take 40 mg by mouth daily.       Marland Kitchen  metoprolol tartrate (LOPRESSOR) 25 MG tablet Take 12.5 mg by mouth Twice daily.       Marland Kitchen. OVER THE COUNTER MEDICATION Take 1 capsule by mouth at bedtime. Equate sleep aid (blue capsule)      . potassium chloride SA (K-DUR,KLOR-CON) 20 MEQ tablet Take 2 tablets (40 mEq total) by mouth 2 (two) times daily.  30 tablet  1  . Probiotic Product (ALIGN PO) Take 1 tablet by mouth daily.      Marland Kitchen. warfarin (COUMADIN) 1 MG tablet Take 2 mg by mouth every evening.        No current facility-administered medications for this visit.    No Known Allergies  Review of Systems negative except from HPI and PMH  Physical Exam BP 124/72  Pulse 71  Ht 5\' 5"  (1.651 m)  Wt 221 lb (100.245 kg)  BMI 36.78 kg/m2 BP  124/72  Pulse 71  Ht 5\' 5"  (1.651 m)  Wt 221 lb (100.245 kg)  BMI 36.78 kg/m2  Well developed and nourished in no acute distress she sits in a wheelchair HENT normal lip stick all over teeth Neck supple  Clear Regular rate and rhythm, no murmurs or gallops Abd-soft with active BS No Clubbing cyanosis 4+ edema Skin-warm and dry; lower extremity erythema  A & Oriented  Grossly normal sensory and motor function  ECG demonstrates ventricular pacing   surface P wave activity is not discernible is present based on device interrogation  Assessment and  Plan

## 2013-06-06 NOTE — Patient Instructions (Addendum)
Your physician has recommended you make the following change in your medication:  1) Take Potassium 10 mEq -- take 4 tablets twice daily  (handwritten prescription given to patient)   Your physician wants you to follow-up in: 1 year with Dr. Graciela Husbands.  You will receive a reminder letter in the mail two months in advance. If you don't receive a letter, please call our office to schedule the follow-up appointment.

## 2013-06-06 NOTE — Assessment & Plan Note (Signed)
She is not well tolerating her 20 mEq tablets of potassium. I've prescribed 10 mEq tablets place.

## 2013-07-18 ENCOUNTER — Encounter (HOSPITAL_BASED_OUTPATIENT_CLINIC_OR_DEPARTMENT_OTHER): Payer: Medicare Other | Attending: Internal Medicine

## 2013-07-18 DIAGNOSIS — E039 Hypothyroidism, unspecified: Secondary | ICD-10-CM | POA: Insufficient documentation

## 2013-07-18 DIAGNOSIS — N186 End stage renal disease: Secondary | ICD-10-CM | POA: Insufficient documentation

## 2013-07-18 DIAGNOSIS — I2510 Atherosclerotic heart disease of native coronary artery without angina pectoris: Secondary | ICD-10-CM | POA: Insufficient documentation

## 2013-07-18 DIAGNOSIS — I872 Venous insufficiency (chronic) (peripheral): Secondary | ICD-10-CM | POA: Insufficient documentation

## 2013-07-18 DIAGNOSIS — Z95810 Presence of automatic (implantable) cardiac defibrillator: Secondary | ICD-10-CM | POA: Insufficient documentation

## 2013-07-18 DIAGNOSIS — Z7901 Long term (current) use of anticoagulants: Secondary | ICD-10-CM | POA: Insufficient documentation

## 2013-07-18 DIAGNOSIS — L97909 Non-pressure chronic ulcer of unspecified part of unspecified lower leg with unspecified severity: Secondary | ICD-10-CM | POA: Insufficient documentation

## 2013-07-18 DIAGNOSIS — I87319 Chronic venous hypertension (idiopathic) with ulcer of unspecified lower extremity: Secondary | ICD-10-CM | POA: Insufficient documentation

## 2013-07-18 DIAGNOSIS — Z9089 Acquired absence of other organs: Secondary | ICD-10-CM | POA: Insufficient documentation

## 2013-07-18 DIAGNOSIS — Z79899 Other long term (current) drug therapy: Secondary | ICD-10-CM | POA: Insufficient documentation

## 2013-07-19 NOTE — Progress Notes (Signed)
[facility]  NAME:  Margaret [name], Margaret [name] NO.:  [id number]  MEDICAL RECORD NO.:  [id number]      DATE OF BIRTH:  07/28/1940  PHYSICIAN:  Maxwell Caul, M.D. VISIT DATE:  07/18/2013                                  OFFICE VISIT   CHIEF COMPLAINT:  Review of wounds on her right posterior leg.  HISTORY:  The patient is here accompanied by family.  Apparently, these wounds have been there for at least 2 months.  The original one was on the posterior lower leg, and apparently subsequently, a dressing was one being removed which caused a skin tear and a further wound on the anterior lateral part of the leg.  The patient does not have any prior history of wounds.  I suspect she has chronic venous hypertension and resultant inflammation; however, this is the first episode of ulceration she has had.  She has been using a Neosporin under some form of wrap.  I am not exactly sure what they have been applying topically.  PAST MEDICAL HISTORY:  Includes: 1. Congestive heart failure. 2. Coronary artery disease. 3. Hypothyroidism. 4. End-stage renal disease. 5. Cardiac valve repair in 2013. 6. Defibrillator placement in 2013. 7. Cholecystectomy.  CURRENT MEDICATIONS: 1. Amiodarone 200 daily. 2. Clarinex 5 daily. 3. Flonase both nostrils daily. 4. Folic acid 1 mg daily. 5. Lasix 30 b.i.d. 6. Synthroid 50 daily. 7. Mevacor 40 daily. 8. Lopressor 12.5 b.i.d. 9. K-Dur 20 mEq b.i.d. 10.Coumadin 1 mg daily. 11.Metolazone 5 mg daily p.r.n. if her weight is up.  PHYSICAL EXAMINATION:  GENERAL:  Somewhat frail woman, but bright and engaged. There are actually 2 wounds involved on the posterior leg measuring 1.5 x 1.4 x 0.2 and more superficial wound on the right anterior lateral leg measuring 1.2 x 2.5 x 0.1.  Both of these appear to have healthy granulation.  Her ABI calculated in this clinic, was 1.11.  She does have surrounding erythema with some  warmth; however, this is nontender. I spent some time pondering the possibility of cellulitis; however, after extensively discussing this with the patient, I think she has more chronic inflammation from chronic venous insufficiency.  I did not do any cultures. RESPIRATORY:  Examination was clear. CARDIAC:  No active evidence of decompensated CHF.  IMPRESSION:  Lower extremity wounds secondary to venous stasis and apparently an injury while removing a specific dressing.  We dressed these with silver collagen.  We applied Profore Lite wrap.  I did not culture anything, did not add any antibiotics.  There is no evidence of arterial insufficiency.          ______________________________ Maxwell Caul, M.D.     MGR/MEDQ  D:  07/18/2013  T:  07/19/2013  Job:  [id number]

## 2013-07-25 ENCOUNTER — Encounter (HOSPITAL_BASED_OUTPATIENT_CLINIC_OR_DEPARTMENT_OTHER): Payer: Medicare Other | Attending: Internal Medicine

## 2013-07-25 DIAGNOSIS — I87339 Chronic venous hypertension (idiopathic) with ulcer and inflammation of unspecified lower extremity: Secondary | ICD-10-CM | POA: Insufficient documentation

## 2013-07-25 DIAGNOSIS — L97809 Non-pressure chronic ulcer of other part of unspecified lower leg with unspecified severity: Secondary | ICD-10-CM | POA: Insufficient documentation

## 2013-07-25 DIAGNOSIS — L97909 Non-pressure chronic ulcer of unspecified part of unspecified lower leg with unspecified severity: Principal | ICD-10-CM

## 2013-08-01 DIAGNOSIS — L97809 Non-pressure chronic ulcer of other part of unspecified lower leg with unspecified severity: Secondary | ICD-10-CM | POA: Diagnosis not present

## 2013-08-01 DIAGNOSIS — I87339 Chronic venous hypertension (idiopathic) with ulcer and inflammation of unspecified lower extremity: Secondary | ICD-10-CM | POA: Diagnosis not present

## 2013-08-08 DIAGNOSIS — I87339 Chronic venous hypertension (idiopathic) with ulcer and inflammation of unspecified lower extremity: Secondary | ICD-10-CM | POA: Diagnosis not present

## 2013-08-08 DIAGNOSIS — L97909 Non-pressure chronic ulcer of unspecified part of unspecified lower leg with unspecified severity: Secondary | ICD-10-CM | POA: Diagnosis not present

## 2013-08-08 DIAGNOSIS — L97809 Non-pressure chronic ulcer of other part of unspecified lower leg with unspecified severity: Secondary | ICD-10-CM | POA: Diagnosis not present

## 2013-08-22 DIAGNOSIS — I87339 Chronic venous hypertension (idiopathic) with ulcer and inflammation of unspecified lower extremity: Secondary | ICD-10-CM | POA: Diagnosis not present

## 2013-08-22 DIAGNOSIS — L97909 Non-pressure chronic ulcer of unspecified part of unspecified lower leg with unspecified severity: Secondary | ICD-10-CM | POA: Diagnosis not present

## 2013-08-22 DIAGNOSIS — L97809 Non-pressure chronic ulcer of other part of unspecified lower leg with unspecified severity: Secondary | ICD-10-CM | POA: Diagnosis not present

## 2013-08-26 ENCOUNTER — Encounter (HOSPITAL_BASED_OUTPATIENT_CLINIC_OR_DEPARTMENT_OTHER): Payer: Medicare Other | Attending: Internal Medicine

## 2013-08-26 DIAGNOSIS — L03119 Cellulitis of unspecified part of limb: Secondary | ICD-10-CM

## 2013-08-26 DIAGNOSIS — L97909 Non-pressure chronic ulcer of unspecified part of unspecified lower leg with unspecified severity: Principal | ICD-10-CM

## 2013-08-26 DIAGNOSIS — L02419 Cutaneous abscess of limb, unspecified: Secondary | ICD-10-CM | POA: Insufficient documentation

## 2013-08-26 DIAGNOSIS — I87339 Chronic venous hypertension (idiopathic) with ulcer and inflammation of unspecified lower extremity: Secondary | ICD-10-CM | POA: Insufficient documentation

## 2013-08-29 DIAGNOSIS — L97909 Non-pressure chronic ulcer of unspecified part of unspecified lower leg with unspecified severity: Secondary | ICD-10-CM | POA: Diagnosis not present

## 2013-08-29 DIAGNOSIS — L03119 Cellulitis of unspecified part of limb: Secondary | ICD-10-CM | POA: Diagnosis not present

## 2013-08-29 DIAGNOSIS — L02419 Cutaneous abscess of limb, unspecified: Secondary | ICD-10-CM | POA: Diagnosis not present

## 2013-08-29 DIAGNOSIS — I87339 Chronic venous hypertension (idiopathic) with ulcer and inflammation of unspecified lower extremity: Secondary | ICD-10-CM | POA: Diagnosis not present

## 2013-08-30 ENCOUNTER — Ambulatory Visit (HOSPITAL_COMMUNITY)
Admission: RE | Admit: 2013-08-30 | Discharge: 2013-08-30 | Disposition: A | Discharge status: Home / Self Care | Payer: Medicare Other | Source: Ambulatory Visit | Attending: Internal Medicine | Admitting: Internal Medicine

## 2013-08-30 ENCOUNTER — Other Ambulatory Visit (HOSPITAL_COMMUNITY): Payer: Self-pay | Admitting: Internal Medicine

## 2013-08-30 DIAGNOSIS — M7989 Other specified soft tissue disorders: Secondary | ICD-10-CM

## 2013-08-30 DIAGNOSIS — M79609 Pain in unspecified limb: Secondary | ICD-10-CM | POA: Insufficient documentation

## 2013-08-30 DIAGNOSIS — M25569 Pain in unspecified knee: Secondary | ICD-10-CM

## 2013-08-30 DIAGNOSIS — E669 Obesity, unspecified: Secondary | ICD-10-CM

## 2013-08-30 NOTE — Progress Notes (Signed)
VASCULAR LAB PRELIMINARY  PRELIMINARY  PRELIMINARY  PRELIMINARY  Left lower extremity venous duplex completed.    Preliminary report:  Left:  No evidence of DVT, superficial thrombosis, or Baker's cyst.  Margaret [name], RVS 08/30/2013, 11:23 AM

## 2013-09-05 DIAGNOSIS — L02419 Cutaneous abscess of limb, unspecified: Secondary | ICD-10-CM | POA: Diagnosis not present

## 2013-09-05 DIAGNOSIS — I87339 Chronic venous hypertension (idiopathic) with ulcer and inflammation of unspecified lower extremity: Secondary | ICD-10-CM | POA: Diagnosis not present

## 2013-09-05 DIAGNOSIS — L03119 Cellulitis of unspecified part of limb: Secondary | ICD-10-CM | POA: Diagnosis not present

## 2013-09-12 DIAGNOSIS — L02419 Cutaneous abscess of limb, unspecified: Secondary | ICD-10-CM | POA: Diagnosis not present

## 2013-09-12 DIAGNOSIS — I87339 Chronic venous hypertension (idiopathic) with ulcer and inflammation of unspecified lower extremity: Secondary | ICD-10-CM | POA: Diagnosis not present

## 2013-09-19 DIAGNOSIS — L97909 Non-pressure chronic ulcer of unspecified part of unspecified lower leg with unspecified severity: Secondary | ICD-10-CM | POA: Diagnosis not present

## 2013-09-19 DIAGNOSIS — L02419 Cutaneous abscess of limb, unspecified: Secondary | ICD-10-CM | POA: Diagnosis not present

## 2013-09-19 DIAGNOSIS — I87339 Chronic venous hypertension (idiopathic) with ulcer and inflammation of unspecified lower extremity: Secondary | ICD-10-CM | POA: Diagnosis not present

## 2013-09-20 ENCOUNTER — Encounter: Payer: Self-pay | Admitting: Neurology

## 2013-09-20 ENCOUNTER — Ambulatory Visit (INDEPENDENT_AMBULATORY_CARE_PROVIDER_SITE_OTHER): Payer: Medicare Other | Admitting: Neurology

## 2013-09-20 VITALS — BP 124/76 | HR 73 | Resp 16

## 2013-09-20 DIAGNOSIS — G620 Drug-induced polyneuropathy: Secondary | ICD-10-CM

## 2013-09-20 DIAGNOSIS — R269 Unspecified abnormalities of gait and mobility: Secondary | ICD-10-CM

## 2013-09-20 DIAGNOSIS — R251 Tremor, unspecified: Secondary | ICD-10-CM

## 2013-09-20 DIAGNOSIS — R259 Unspecified abnormal involuntary movements: Secondary | ICD-10-CM

## 2013-09-20 DIAGNOSIS — T462X5A Adverse effect of other antidysrhythmic drugs, initial encounter: Secondary | ICD-10-CM

## 2013-09-20 DIAGNOSIS — R2681 Unsteadiness on feet: Secondary | ICD-10-CM

## 2013-09-20 NOTE — Patient Instructions (Signed)
1. We are referring you to [facility] for a DaT scan. They will contact you directly to set up a time for this testing. If you do not hear from them they can be contacted at [phone]. Please get a copy of DaT scan results on a disc once complete.  2. Please make a follow up after the DaT scan is performed.

## 2013-09-20 NOTE — Progress Notes (Signed)
Subjective:    Margaret [name] was seen in consultation in the movement disorder clinic at the request of Allean Found, MD.  The evaluation is for tremor and falls.  Pt has previously seen Dr. Frances Furbish and I did review that consult.  Pt is accompanied by her daughter who supplements the history.  Pt has a one year hx of gait instability and pts daughter states that she had a 3 week hx of tremors.   Although daughter says that tremor has only been going on for 3 weeks and said that tremor wasn't present when they saw Dr. Frances Furbish, neuro notes from Dr. Frances Furbish in 12/2012 indicate pt c/o action tremor at that time.   She is basically WC bound at this point (could not even stand on the scale to weigh).  She can no longer use a walker.  She needs assist to transfer to the bed/bedside commode.  Tremor is most evident when doing something with the hands but she also has it in the legs when she is trying to use them.  Hand tremor is in both hands.  She does not need assistance with feeding.  Food can land on her chest before it gets to the mouth.  She does not spill glasses of liquids.  She generally requires help with tying the shoes but they think that is more of trouble reaching the shoe.  She has trouble with little buttons.  She drinks one coke zero in the AM but everything else is caffeine free.  She did work with PT about 3 months ago ([facility]) and they did not think that they could help her.  She is on amiodarone and pt believes that she has been on it since 1996.  Current/Previously tried tremor medications: n/a  Current medications that may exacerbate tremor:  amiodarone  Outside reports reviewed: historical medical records, lab reports and referral letter/letters.  No Known Allergies  Current Outpatient Prescriptions on File Prior to Visit  Medication Sig Dispense Refill  . amiodarone (PACERONE) 200 MG tablet Take 200 mg by mouth daily.       . calcium carbonate (TUMS - DOSED IN MG ELEMENTAL  CALCIUM) 500 MG chewable tablet Chew 2 tablets by mouth daily. Flavored TUMS      . desloratadine (CLARINEX) 5 MG tablet Take 5 mg by mouth daily.      . fluticasone (FLONASE) 50 MCG/ACT nasal spray Place 1 drop into both nostrils at bedtime.       . folic acid (FOLVITE) 1 MG tablet Take 1 tablet by mouth daily.      . furosemide (LASIX) 80 MG tablet Take 80 mg by mouth 2 (two) times daily.      Marland Kitchen levothyroxine (SYNTHROID, LEVOTHROID) 50 MCG tablet Take 50 mcg by mouth daily before breakfast.      . lovastatin (MEVACOR) 40 MG tablet Take 40 mg by mouth daily.       . metoprolol tartrate (LOPRESSOR) 25 MG tablet Take 12.5 mg by mouth Twice daily.       Marland Kitchen OVER THE COUNTER MEDICATION Take 1 capsule by mouth at bedtime. Equate sleep aid (blue capsule)      . potassium chloride SA (K-DUR,KLOR-CON) 20 MEQ tablet Take 2 tablets (40 mEq total) by mouth 2 (two) times daily.  30 tablet  1  . warfarin (COUMADIN) 1 MG tablet Take 2 mg by mouth every evening.        No current facility-administered medications on file prior to visit.  Past Medical History  Diagnosis Date  . NICM (nonischemic cardiomyopathy)     EF 35%; cath 2/12 no CAD  . Systolic CHF, chronic   . Severe mitral regurgitation     s/p complex mitral valve repair with cox maze + LAA clipping, removal of RV lead, and implantation of CRT-D in abdomen  . Cardiac arrest - ventricular fibrillation     in 1990s  . Atrial fibrillation     amiodarone  . HTN (hypertension)   . HLD (hyperlipidemia)   . Achalasia     s/p dilation  . Recurrent UTI   . Obesity   . Arthritis   . Anoxic brain injury 1996    s/p cardiac arrest     Past Surgical History  Procedure Laterality Date  . Defibrillator generator explantation and reimplantation; and  08/02/2001  . Device migration with anticipated pocket revision  10/29/2003  . 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 Attempted implantation of an implantable  cardioverter-  12/30/2004  . Cardioversion  04/14/2010  . Median sternotomy  07/16/2010  . Mitral valve repair  07/16/2010  . Cox maze procedure (complete biatrial lesion set with clipping of  07/16/2010  . Removal of old endocardial right ventricular defibrillator lead.  07/16/2010  . Placement of dual chamber pacemaker and implantable cardiac  07/16/2010  . Placement of swan ganz pulmonary artery catheter via left femoral access.  07/16/2010  . Laparoscopic cholecystectomy    . Cataract extraction, bilateral      History   Social History  . Marital Status: Married    Spouse Name: Homero Fellers    Number of Children: 2  . Years of Education: college   Occupational History  . Retired     Conservator, museum/gallery, school system   Social History Main Topics  . Smoking status: Never Smoker   . Smokeless tobacco: Never Used  . Alcohol Use: No  . Drug Use: No  . Sexual Activity: Not on file   Other Topics Concern  . Not on file   Social History Narrative   Patient lives with spouse.   Caffeine Use: 1 soda daily    Family Status  Relation Status Death Age  . Mother Deceased 65    stroke  . Father Deceased 33    lung cancer  . Brother Alive     diabetes  . Daughter Alive   . Son Alive     Review of Systems Chronic constipation.  Some difficulty swallowing hamburger/breads and dry foods.  No lateralizing weakness/paresthesias.  A complete 10 system ROS was obtained and was negative apart from what is mentioned.   Objective:   VITALS:   Filed Vitals:   09/20/13 0748  BP: 124/76  Pulse: 73  Resp: 16   Gen:  Appears stated age and in NAD. HEENT:  Normocephalic, atraumatic. The mucous membranes are moist. The superficial temporal arteries are without ropiness or tenderness. Cardiovascular: Regular rate and rhythm. Lungs: Clear to auscultation bilaterally. Neck: There are no carotid bruits noted bilaterally.  NEUROLOGICAL:  Orientation:  The patient is alert and oriented x 3.   Looks to daughter to provide more detailed aspects of the history. Cranial nerves: There is good facial symmetry. The pupils are equal round and reactive to light bilaterally. Fundoscopic exam reveals clear disc margins bilaterally. Extraocular muscles are intact but has difficulty with smooth pursuit and visual fields are full to confrontational testing. Speech is fluent and clear.  Soft palate rises symmetrically and there is no tongue deviation. Hearing is intact to conversational tone. Tone: Tone is good throughout. Sensation: Sensation is intact to light touch and pinprick throughout (facial, trunk, extremities).  Pinprick is markedly decreased in both a stocking and glove distribution.  Vibration is decreased in a distal fashion, both in the arms and legs.  There is no extinction with double simultaneous stimulation. There is no sensory dermatomal level identified. Coordination:  There is no decremation with rapid alternating movements, but all forms of rapid alternating movements bilaterally are quite slow.  In addition, she is apraxic when asked to perform virtually all forms of coordination as well as motor commands. Motor: Strength is 5/5 in the bilateral upper and lower extremities.  Shoulder shrug is equal bilaterally.  There is no pronator drift.  There are no fasciculations noted. DTR's: Deep tendon reflexes are 2-/4 at the bilateral biceps, triceps, brachioradialis, patella and absent at the bilateral achilles.  Plantar responses are downgoing bilaterally. Gait and Station: The patient is able to get out of the wheelchair with fairly minimal assistance and is able to stand with a walker.  She actually is able to walk with a walker, but she does shuffle.  She is unstable and is only able to walk a few steps before she asks to sit.  When sitting, she actually falls into the chair as opposed to sitting slowly.  MOVEMENT EXAM: Tremor:  There is no rest tremor.  There is minimal tremor of the  outstretched hands that just slightly increases with intention.  Lab Results  Component Value Date   VITAMINB12 443 03/31/2013   Lab Results  Component Value Date   HGBA1C 5.6 03/25/2012     Chemistry      Component Value Date/Time   NA 139 04/02/2013 0430   K 3.1* 04/02/2013 0430   CL 97 04/02/2013 0430   CO2 30 04/02/2013 0430   BUN 57* 04/02/2013 0430   CREATININE 2.31* 04/02/2013 0430      Component Value Date/Time   CALCIUM 8.4 04/02/2013 0430   ALKPHOS 76 03/31/2013 0445   AST 37 03/31/2013 0445   ALT 21 03/31/2013 0445   BILITOT 0.9 03/31/2013 0445          Assessment/Plan:   1.   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  There is no doubt on clinical exam that she has peripheral neuropathy, the question would really be the etiology.  Amiodarone can certainly produce an axonal neuropathy, but I understand that the patient needs the medication.  I had a long discussion with the patient and her daughter today.  I think the real question is whether or not there is some  other disease state lying beneath the amiodarone, and I am not sure of that.  I am somewhat doubtful of it, but in the end we decided to go ahead and order a DaT scan as that could potentially change treatment significantly.  -I did not see the tremor in her legs today when she stands, but I do suspect that that tremor is likely due to deconditioning.  I do not suspect that that is an orthostatic tremor, or I would have been more likely to see it, and they would see it more consistently. 2.  Follow up after above is completed.

## 2013-09-23 ENCOUNTER — Telehealth: Payer: Self-pay | Admitting: Neurology

## 2013-09-23 NOTE — Telephone Encounter (Signed)
Diane from [facility] called and said that she got her sch for 10-22-13 at 10:30 for a dat scan her phone number is  [phone] if you need it

## 2013-09-26 ENCOUNTER — Encounter (HOSPITAL_BASED_OUTPATIENT_CLINIC_OR_DEPARTMENT_OTHER): Payer: Medicare Other | Attending: Internal Medicine

## 2013-09-26 DIAGNOSIS — L97809 Non-pressure chronic ulcer of other part of unspecified lower leg with unspecified severity: Secondary | ICD-10-CM | POA: Insufficient documentation

## 2013-09-26 DIAGNOSIS — L97909 Non-pressure chronic ulcer of unspecified part of unspecified lower leg with unspecified severity: Principal | ICD-10-CM

## 2013-09-26 DIAGNOSIS — I87339 Chronic venous hypertension (idiopathic) with ulcer and inflammation of unspecified lower extremity: Secondary | ICD-10-CM | POA: Insufficient documentation

## 2013-10-03 ENCOUNTER — Encounter (HOSPITAL_COMMUNITY): Payer: Self-pay | Admitting: Emergency Medicine

## 2013-10-03 ENCOUNTER — Emergency Department (HOSPITAL_COMMUNITY): Payer: Medicare Other

## 2013-10-03 ENCOUNTER — Inpatient Hospital Stay (HOSPITAL_COMMUNITY)
Admission: EM | Admit: 2013-10-03 | Discharge: 2013-10-07 | DRG: 699 | Disposition: A | Discharge status: Skilled Nursing Facility | Payer: Medicare Other | Attending: Internal Medicine | Admitting: Internal Medicine

## 2013-10-03 DIAGNOSIS — R7989 Other specified abnormal findings of blood chemistry: Secondary | ICD-10-CM

## 2013-10-03 DIAGNOSIS — Z79899 Other long term (current) drug therapy: Secondary | ICD-10-CM

## 2013-10-03 DIAGNOSIS — Z8673 Personal history of transient ischemic attack (TIA), and cerebral infarction without residual deficits: Secondary | ICD-10-CM

## 2013-10-03 DIAGNOSIS — R5383 Other fatigue: Secondary | ICD-10-CM

## 2013-10-03 DIAGNOSIS — I129 Hypertensive chronic kidney disease with stage 1 through stage 4 chronic kidney disease, or unspecified chronic kidney disease: Secondary | ICD-10-CM | POA: Diagnosis present

## 2013-10-03 DIAGNOSIS — Z6832 Body mass index (BMI) 32.0-32.9, adult: Secondary | ICD-10-CM

## 2013-10-03 DIAGNOSIS — Z8674 Personal history of sudden cardiac arrest: Secondary | ICD-10-CM

## 2013-10-03 DIAGNOSIS — T83511A Infection and inflammatory reaction due to indwelling urethral catheter, initial encounter: Principal | ICD-10-CM | POA: Diagnosis present

## 2013-10-03 DIAGNOSIS — Z9889 Other specified postprocedural states: Secondary | ICD-10-CM

## 2013-10-03 DIAGNOSIS — N183 Chronic kidney disease, stage 3 unspecified: Secondary | ICD-10-CM | POA: Diagnosis present

## 2013-10-03 DIAGNOSIS — I5022 Chronic systolic (congestive) heart failure: Secondary | ICD-10-CM

## 2013-10-03 DIAGNOSIS — I428 Other cardiomyopathies: Secondary | ICD-10-CM

## 2013-10-03 DIAGNOSIS — D638 Anemia in other chronic diseases classified elsewhere: Secondary | ICD-10-CM | POA: Diagnosis present

## 2013-10-03 DIAGNOSIS — I10 Essential (primary) hypertension: Secondary | ICD-10-CM

## 2013-10-03 DIAGNOSIS — M1990 Unspecified osteoarthritis, unspecified site: Secondary | ICD-10-CM

## 2013-10-03 DIAGNOSIS — E039 Hypothyroidism, unspecified: Secondary | ICD-10-CM | POA: Diagnosis present

## 2013-10-03 DIAGNOSIS — E669 Obesity, unspecified: Secondary | ICD-10-CM

## 2013-10-03 DIAGNOSIS — Z8679 Personal history of other diseases of the circulatory system: Secondary | ICD-10-CM

## 2013-10-03 DIAGNOSIS — G931 Anoxic brain damage, not elsewhere classified: Secondary | ICD-10-CM

## 2013-10-03 DIAGNOSIS — N390 Urinary tract infection, site not specified: Secondary | ICD-10-CM

## 2013-10-03 DIAGNOSIS — Z7901 Long term (current) use of anticoagulants: Secondary | ICD-10-CM

## 2013-10-03 DIAGNOSIS — Y846 Urinary catheterization as the cause of abnormal reaction of the patient, or of later complication, without mention of misadventure at the time of the procedure: Secondary | ICD-10-CM | POA: Diagnosis present

## 2013-10-03 DIAGNOSIS — Z87440 Personal history of urinary (tract) infections: Secondary | ICD-10-CM

## 2013-10-03 DIAGNOSIS — R531 Weakness: Secondary | ICD-10-CM

## 2013-10-03 DIAGNOSIS — Z95810 Presence of automatic (implantable) cardiac defibrillator: Secondary | ICD-10-CM

## 2013-10-03 DIAGNOSIS — R945 Abnormal results of liver function studies: Secondary | ICD-10-CM

## 2013-10-03 DIAGNOSIS — E785 Hyperlipidemia, unspecified: Secondary | ICD-10-CM

## 2013-10-03 DIAGNOSIS — R5381 Other malaise: Secondary | ICD-10-CM | POA: Diagnosis present

## 2013-10-03 DIAGNOSIS — I4891 Unspecified atrial fibrillation: Secondary | ICD-10-CM

## 2013-10-03 DIAGNOSIS — I509 Heart failure, unspecified: Secondary | ICD-10-CM | POA: Diagnosis present

## 2013-10-03 DIAGNOSIS — N184 Chronic kidney disease, stage 4 (severe): Secondary | ICD-10-CM

## 2013-10-03 HISTORY — DX: Presence of automatic (implantable) cardiac defibrillator: Z95.810

## 2013-10-03 HISTORY — DX: Hypothyroidism, unspecified: E03.9

## 2013-10-03 HISTORY — DX: Transient cerebral ischemic attack, unspecified: G45.9

## 2013-10-03 HISTORY — DX: Chronic kidney disease, stage 4 (severe): N18.4

## 2013-10-03 LAB — COMPREHENSIVE METABOLIC PANEL
ALT: 47 U/L — ABNORMAL HIGH (ref 0–35)
AST: 56 U/L — ABNORMAL HIGH (ref 0–37)
Albumin: 2.8 g/dL — ABNORMAL LOW (ref 3.5–5.2)
Alkaline Phosphatase: 88 U/L (ref 39–117)
BILIRUBIN TOTAL: 0.5 mg/dL (ref 0.3–1.2)
BUN: 44 mg/dL — AB (ref 6–23)
CALCIUM: 8.4 mg/dL (ref 8.4–10.5)
CHLORIDE: 99 meq/L (ref 96–112)
CO2: 27 meq/L (ref 19–32)
Creatinine, Ser: 2.43 mg/dL — ABNORMAL HIGH (ref 0.50–1.10)
GFR calc Af Amer: 22 mL/min — ABNORMAL LOW (ref >90)
GFR, EST NON AFRICAN AMERICAN: 19 mL/min — AB (ref >90)
Glucose, Bld: 108 mg/dL — ABNORMAL HIGH (ref 70–99)
Potassium: 4.3 mEq/L (ref 3.7–5.3)
Sodium: 140 mEq/L (ref 137–147)
Total Protein: 7.3 g/dL (ref 6.0–8.3)

## 2013-10-03 LAB — I-STAT CG4 LACTIC ACID, ED: Lactic Acid, Venous: 1.04 mmol/L (ref 0.5–2.2)

## 2013-10-03 LAB — TROPONIN I

## 2013-10-03 LAB — URINALYSIS, ROUTINE W REFLEX MICROSCOPIC
Bilirubin Urine: NEGATIVE
Glucose, UA: NEGATIVE mg/dL
Ketones, ur: NEGATIVE mg/dL
NITRITE: NEGATIVE
PROTEIN: NEGATIVE mg/dL
Specific Gravity, Urine: 1.009 (ref 1.005–1.030)
UROBILINOGEN UA: 2 mg/dL — AB (ref 0.0–1.0)
pH: 7 (ref 5.0–8.0)

## 2013-10-03 LAB — CBC WITH DIFFERENTIAL/PLATELET
Basophils Absolute: 0 10*3/uL (ref 0.0–0.1)
Basophils Relative: 0 % (ref 0–1)
Eosinophils Absolute: 0.2 10*3/uL (ref 0.0–0.7)
Eosinophils Relative: 4 % (ref 0–5)
HEMATOCRIT: 34.6 % — AB (ref 36.0–46.0)
HEMOGLOBIN: 10.6 g/dL — AB (ref 12.0–15.0)
LYMPHS ABS: 1.4 10*3/uL (ref 0.7–4.0)
LYMPHS PCT: 25 % (ref 12–46)
MCH: 29.9 pg (ref 26.0–34.0)
MCHC: 30.6 g/dL (ref 30.0–36.0)
MCV: 97.7 fL (ref 78.0–100.0)
MONO ABS: 0.5 10*3/uL (ref 0.1–1.0)
Monocytes Relative: 9 % (ref 3–12)
NEUTROS ABS: 3.3 10*3/uL (ref 1.7–7.7)
Neutrophils Relative %: 62 % (ref 43–77)
Platelets: 256 10*3/uL (ref 150–400)
RBC: 3.54 MIL/uL — AB (ref 3.87–5.11)
RDW: 16.5 % — AB (ref 11.5–15.5)
WBC: 5.3 10*3/uL (ref 4.0–10.5)

## 2013-10-03 LAB — URINE MICROSCOPIC-ADD ON

## 2013-10-03 LAB — PROTIME-INR
INR: 2.34 — AB (ref 0.00–1.49)
Prothrombin Time: 24.9 seconds — ABNORMAL HIGH (ref 11.6–15.2)

## 2013-10-03 LAB — CK: Total CK: 95 U/L (ref 7–177)

## 2013-10-03 LAB — TSH: TSH: 26.43 u[IU]/mL — AB (ref 0.350–4.500)

## 2013-10-03 MED ORDER — SIMVASTATIN 20 MG PO TABS
20.0000 mg | ORAL_TABLET | Freq: Every day | ORAL | Status: DC
  Administered 2013-10-04: 20 mg via ORAL
  Filled 2013-10-03 (×2): qty 1

## 2013-10-03 MED ORDER — WARFARIN SODIUM 2 MG PO TABS
2.0000 mg | ORAL_TABLET | Freq: Once | ORAL | Status: AC
  Administered 2013-10-03: 2 mg via ORAL
  Filled 2013-10-03: qty 1

## 2013-10-03 MED ORDER — FLUTICASONE PROPIONATE 50 MCG/ACT NA SUSP
2.0000 | Freq: Every day | NASAL | Status: DC
  Administered 2013-10-03 – 2013-10-06 (×4): 2 via NASAL
  Filled 2013-10-03: qty 16

## 2013-10-03 MED ORDER — CEFTRIAXONE SODIUM 1 G IJ SOLR
1.0000 g | INTRAMUSCULAR | Status: DC
  Administered 2013-10-04 – 2013-10-06 (×3): 1 g via INTRAVENOUS
  Filled 2013-10-03 (×4): qty 10

## 2013-10-03 MED ORDER — ACETAMINOPHEN 650 MG RE SUPP: 650.0000 mg | Freq: Four times a day (QID) | RECTAL | Status: DC | PRN

## 2013-10-03 MED ORDER — POTASSIUM CHLORIDE CRYS ER 20 MEQ PO TBCR: 40.0000 meq | EXTENDED_RELEASE_TABLET | Freq: Two times a day (BID) | ORAL | Status: DC

## 2013-10-03 MED ORDER — WARFARIN - PHARMACIST DOSING INPATIENT: Freq: Every day | Status: DC

## 2013-10-03 MED ORDER — LEVOTHYROXINE SODIUM 50 MCG PO TABS
50.0000 ug | ORAL_TABLET | Freq: Every day | ORAL | Status: DC
  Administered 2013-10-04 – 2013-10-07 (×4): 50 ug via ORAL
  Filled 2013-10-03 (×5): qty 1

## 2013-10-03 MED ORDER — ONDANSETRON HCL 4 MG PO TABS: 4.0000 mg | ORAL_TABLET | Freq: Four times a day (QID) | ORAL | Status: DC | PRN

## 2013-10-03 MED ORDER — ACETAMINOPHEN 325 MG PO TABS: 650.0000 mg | ORAL_TABLET | Freq: Four times a day (QID) | ORAL | Status: DC | PRN

## 2013-10-03 MED ORDER — LORATADINE 10 MG PO TABS
10.0000 mg | ORAL_TABLET | Freq: Every day | ORAL | Status: DC
  Administered 2013-10-04 – 2013-10-07 (×4): 10 mg via ORAL
  Filled 2013-10-03 (×4): qty 1

## 2013-10-03 MED ORDER — FUROSEMIDE 80 MG PO TABS
80.0000 mg | ORAL_TABLET | Freq: Two times a day (BID) | ORAL | Status: DC
  Administered 2013-10-04 – 2013-10-07 (×7): 80 mg via ORAL
  Filled 2013-10-03 (×9): qty 1

## 2013-10-03 MED ORDER — ONDANSETRON HCL 4 MG/2ML IJ SOLN: 4.0000 mg | Freq: Four times a day (QID) | INTRAMUSCULAR | Status: DC | PRN

## 2013-10-03 MED ORDER — METOPROLOL TARTRATE 12.5 MG HALF TABLET
12.5000 mg | ORAL_TABLET | Freq: Two times a day (BID) | ORAL | Status: DC
  Administered 2013-10-03 – 2013-10-07 (×7): 12.5 mg via ORAL
  Filled 2013-10-03 (×9): qty 1

## 2013-10-03 MED ORDER — DEXTROSE 5 % IV SOLN
1.0000 g | Freq: Once | INTRAVENOUS | Status: AC
  Administered 2013-10-03: 1 g via INTRAVENOUS
  Filled 2013-10-03: qty 10

## 2013-10-03 MED ORDER — SODIUM CHLORIDE 0.9 % IJ SOLN
3.0000 mL | Freq: Two times a day (BID) | INTRAMUSCULAR | Status: DC
  Administered 2013-10-03 – 2013-10-07 (×5): 3 mL via INTRAVENOUS

## 2013-10-03 MED ORDER — AMIODARONE HCL 200 MG PO TABS
200.0000 mg | ORAL_TABLET | Freq: Every day | ORAL | Status: DC
  Administered 2013-10-04 – 2013-10-05 (×2): 200 mg via ORAL
  Filled 2013-10-03 (×2): qty 1

## 2013-10-03 MED ORDER — POTASSIUM CHLORIDE CRYS ER 20 MEQ PO TBCR
40.0000 meq | EXTENDED_RELEASE_TABLET | Freq: Two times a day (BID) | ORAL | Status: DC
  Administered 2013-10-03 – 2013-10-07 (×8): 40 meq via ORAL
  Filled 2013-10-03 (×9): qty 2

## 2013-10-03 MED ORDER — FOLIC ACID 1 MG PO TABS
1.0000 mg | ORAL_TABLET | Freq: Every day | ORAL | Status: DC
  Administered 2013-10-04 – 2013-10-07 (×4): 1 mg via ORAL
  Filled 2013-10-03 (×4): qty 1

## 2013-10-03 NOTE — ED Notes (Signed)
Admitting at bedside 

## 2013-10-03 NOTE — ED Notes (Signed)
Patient transported to X-ray 

## 2013-10-03 NOTE — H&P (Signed)
[facility] History and Physical  Margaret [name] ZOX:[id number] DOB: 1940/05/02 DOA: 10/03/2013  Referring physician: ER physician. PCP: Allean Found, MD  Chief Complaint: Weakness.  HPI: Margaret [name] is a 73 y.o. 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 As per patient and husband patient has been feeling weak over the last few weeks but last few days patient was feeling more weak and today was unable to get out of bed. Patient has not had any focal deficits. Denies any chest pain shortness of breath nausea vomiting abdominal pain diarrhea fever chills. In the ER on exam patient is nonfocal is able to move all extremities but feels weak to even get out of the bed. UA shows features concerning for UTI. Given patient's weakness patient has been admitted for further management. Labs reveal mildly elevated LFTs.   Review of Systems: As presented in the history of presenting illness, rest negative.  Past Medical History  Diagnosis Date  . NICM (nonischemic cardiomyopathy)     EF 35%; cath 2/12 no CAD  . Systolic CHF, chronic   . Severe mitral regurgitation     s/p complex mitral valve repair with cox maze + LAA clipping, removal of RV lead, and implantation of CRT-D in abdomen  . Cardiac arrest - ventricular fibrillation     in 1990s  . Atrial fibrillation     amiodarone  . HTN (hypertension)   . HLD (hyperlipidemia)   . Achalasia     s/p dilation  . Recurrent UTI   . Obesity   . Arthritis   . Anoxic brain injury 1996    s/p cardiac arrest    Past Surgical History  Procedure Laterality Date  . Defibrillator generator explantation and reimplantation; and  08/02/2001  . Device migration with anticipated pocket  revision  10/29/2003  . 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 Attempted implantation of an implantable cardioverter-  12/30/2004  . Cardioversion  04/14/2010  . Median sternotomy  07/16/2010  . Mitral valve repair  07/16/2010  . Cox maze procedure (complete biatrial lesion set with clipping of  07/16/2010  . Removal of old endocardial right ventricular defibrillator lead.  07/16/2010  . Placement of dual chamber pacemaker and implantable cardiac  07/16/2010  . Placement of swan ganz pulmonary artery catheter via left femoral access.  07/16/2010  . Laparoscopic cholecystectomy    . Cataract extraction, bilateral     Social History:  reports that she has never smoked. She has never used smokeless tobacco. She reports that she does not drink alcohol or use illicit drugs. Where does patient live home. Can patient participate in ADLs? Yes.  No Known Allergies  Family History:  Family History  Problem Relation Age of Onset  . Stroke Mother   . Lung cancer Father   . Diabetes Mellitus II Brother       Prior to Admission medications   Medication Sig Start Date End Date Taking? Authorizing Provider  amiodarone (PACERONE) 200 MG tablet Take 200 mg by mouth daily.    Yes Historical Provider, MD  desloratadine (CLARINEX) 5 MG tablet Take 5 mg by mouth daily.   Yes Historical Provider, MD  fluticasone (FLONASE) 50 MCG/ACT nasal spray Place 1 drop into both nostrils  at bedtime.  06/26/10  Yes Historical Provider, MD  folic acid (FOLVITE) 1 MG tablet Take 1 tablet by mouth daily. 08/31/10  Yes Historical Provider, MD  furosemide (LASIX) 80 MG tablet Take 80 mg by mouth 2 (two) times daily.   Yes Historical Provider, MD  levothyroxine (SYNTHROID, LEVOTHROID) 50 MCG tablet Take 50 mcg by mouth daily before breakfast.   Yes Historical Provider, MD  lovastatin (MEVACOR) 40 MG tablet Take 40 mg by mouth daily.  08/12/10  Yes Historical Provider, MD  metolazone  (ZAROXOLYN) 2.5 MG tablet Take 2.5 mg by mouth as needed.   Yes Historical Provider, MD  metoprolol tartrate (LOPRESSOR) 25 MG tablet Take 12.5 mg by mouth Twice daily.  08/31/10  Yes Historical Provider, MD  OVER THE COUNTER MEDICATION Take 1 capsule by mouth at bedtime. Equate sleep aid (blue capsule)   Yes Historical Provider, MD  potassium chloride SA (K-DUR,KLOR-CON) 20 MEQ tablet Take 40 mEq by mouth 2 (two) times daily. 04/02/13  Yes Leroy Sea, MD  warfarin (COUMADIN) 1 MG tablet Take 2 mg by mouth at bedtime.   Yes Historical Provider, MD    Physical Exam: Filed Vitals:   10/03/13 1812 10/03/13 1830 10/03/13 1900 10/03/13 1930  BP:  106/49 110/66 109/52  Pulse: 70 70 70 70  Temp:      TempSrc:      Resp: 17 11 13 21   SpO2: 99% 99% 97% 100%     General:  Well-developed well-nourished.  Eyes: Anicteric no pallor.  ENT: No discharge from ears eyes nose mouth.  Neck: No mass felt.  Cardiovascular: S1-S2 heard.  Respiratory: No rhonchi or crepitations.  Abdomen: Soft nontender bowel sounds present. No guarding rigidity.  Skin: No known the right lower extremity around the calf area. No active discharge.  Musculoskeletal: Wound leg seen on the right leg.  Psychiatric: Appears normal.  Neurologic: Alert awake oriented to time place and person. Moves all extremities.  Labs on Admission:  Basic Metabolic Panel:  Recent Labs Lab 10/03/13 1648  NA 140  K 4.3  CL 99  CO2 27  GLUCOSE 108*  BUN 44*  CREATININE 2.43*  CALCIUM 8.4   Liver Function Tests:  Recent Labs Lab 10/03/13 1648  AST 56*  ALT 47*  ALKPHOS 88  BILITOT 0.5  PROT 7.3  ALBUMIN 2.8*   No results found for this basename: LIPASE, AMYLASE,  in the last 168 hours No results found for this basename: AMMONIA,  in the last 168 hours CBC:  Recent Labs Lab 10/03/13 1648  WBC 5.3  NEUTROABS 3.3  HGB 10.6*  HCT 34.6*  MCV 97.7  PLT 256   Cardiac Enzymes: No results found for this  basename: CKTOTAL, CKMB, CKMBINDEX, TROPONINI,  in the last 168 hours  BNP (last 3 results)  Recent Labs  03/02/13 2245 03/30/13 1815  PROBNP 1436.0* 1353.0*   CBG: No results found for this basename: GLUCAP,  in the last 168 hours  Radiological Exams on Admission: Dg Chest 2 View  10/03/2013   CLINICAL DATA:  Weakness  EXAM: CHEST  2 VIEW  COMPARISON:  03/30/2013  FINDINGS: A defibrillator is again identified as are postsurgical changes. The cardiac shadow is stable. The lungs are well aerated bilaterally without focal infiltrate or sizable effusion. No acute bony abnormality is seen.  IMPRESSION: No acute abnormality noted.   Electronically Signed   By: Alcide Clever M.D.   On: 10/03/2013 18:09    EKG: Independently  reviewed. Accelerated junctional rhythm. Patient has pacemaker and defibrillator.  Assessment/Plan Principal Problem:   Weakness Active Problems:   ICD-dual-chamber-[employer]   Atrial fibrillation   Chronic kidney disease (CKD), stage IV (severe)   UTI (lower urinary tract infection)   Elevated LFTs   1. Generalized weakness - probably from deconditioning and UTI. Get physical therapy consult. Difficult to assess orthostatics because patient is weak. Patient is nonfocal. 2. UTI with chronic indwelling catheter - follow urine culture. Presently placed on ceftriaxone. Foley catheter was changed in the ER. 3. Right leg wound, chronic - wound team consult requested. 4. Mildly elevated LFTs - abdomen appears benign. Follow LFTs. Patient on amiodarone and statins which may need to be held if LFTs increase. 5. Chronic atrial fibrillation - rate controlled. On Coumadin. 6. Nonischemic cardiomyopathy - presently looks compensated. Last EF was 35%. Patient on Lasix and as needed metolazone. Closely follow intake output and metabolic panel. 7. Chronic kidney disease stage IV - creatinine appears to be at baseline. 8. Chronic anemia - follow CBC.    Code Status: Full code.   Family Communication: Patient's husband and daughter at the bedside.  Disposition Plan: Admit to inpatient.    Louana Fontenot N. [facility] Pager [phone].  If 7PM-7AM, please contact night-coverage [email or web address] Password [email or web address] 10/03/2013, 8:25 PM

## 2013-10-03 NOTE — ED Notes (Signed)
Patient returned from X-ray 

## 2013-10-03 NOTE — ED Provider Notes (Signed)
CSN: [id number]     Arrival date & time 10/03/13  1544 History   First MD Initiated Contact with Patient 10/03/13 1553     Chief Complaint  Patient presents with  . Weakness     (Consider location/radiation/quality/duration/timing/severity/associated sxs/prior Treatment) HPI Comments: Pt is a 73 y.o. Female  who presents with 1 week of gradual onset weakness, much worse since this morning. Pt unable to get OOB this morning. No focal neuro complaints. Denies fever, resp symptoms, CP, ab pain, d/a, urinary symptoms. She had 1 day of n/v 6 days ago, but none since. She has chronic LE wounds which she follows at a wound clinic for.  She has been tolerating liquids well, but has had little solids. She has a chronic indwelling foley changed about 1.5 wks ago.   Patient is a 73 y.o. female presenting with weakness.  Weakness This is a new problem. The current episode started more than 2 days ago. The problem occurs constantly. The problem has been rapidly worsening. Pertinent negatives include no chest pain, no abdominal pain, no headaches and no shortness of breath. Nothing aggravates the symptoms. Nothing relieves the symptoms. She has tried nothing for the symptoms. The treatment provided no relief.    Past Medical History  Diagnosis Date  . NICM (nonischemic cardiomyopathy)     EF 35%; cath 2/12 no CAD  . Systolic CHF, chronic   . Severe mitral regurgitation     s/p complex mitral valve repair with cox maze + LAA clipping, removal of RV lead, and implantation of CRT-D in abdomen  . Cardiac arrest - ventricular fibrillation     in 1990s  . Atrial fibrillation     amiodarone  . HTN (hypertension)   . HLD (hyperlipidemia)   . Achalasia     s/p dilation  . Recurrent UTI   . Obesity   . Anoxic brain injury 1996    s/p cardiac arrest   . Automatic implantable cardioverter-defibrillator in situ   . Hypothyroidism   . TIA (transient ischemic attack)   . Chronic kidney disease (CKD), stage  IV (severe)     [name] 10/03/2013   Past Surgical History  Procedure Laterality Date  . Defibrillator generator explantation and reimplantation; and  08/02/2001  . Device migration with anticipated pocket revision  10/29/2003  . 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 Attempted implantation of an implantable cardioverter-  12/30/2004  . Cardioversion  04/14/2010  . Median sternotomy  07/16/2010  . Mitral valve repair  07/16/2010  . Cox maze procedure (complete biatrial lesion set with clipping of  07/16/2010  . Removal of old endocardial right ventricular defibrillator lead.  07/16/2010  . Placement of dual chamber pacemaker and implantable cardiac  07/16/2010  . Placement of swan ganz pulmonary artery catheter via left femoral access.  07/16/2010  . Laparoscopic cholecystectomy    . Cataract extraction, bilateral    . Cystoscopy w/ stone manipulation    . Cataract extraction, bilateral Bilateral    Family History  Problem Relation Age of Onset  . Stroke Mother   . Lung cancer Father   . Diabetes Mellitus II Brother    History  Substance Use Topics  . Smoking status: Never Smoker   . Smokeless tobacco: Never Used  . Alcohol Use: No   OB History   Grav Para Term Preterm Abortions TAB SAB Ect Mult Living  Review of Systems  Constitutional: Negative for fever, chills, diaphoresis, activity change, appetite change and fatigue.  HENT: Negative for congestion, facial swelling, rhinorrhea and sore throat.   Eyes: Negative for photophobia and discharge.  Respiratory: Negative for cough, chest tightness and shortness of breath.   Cardiovascular: Negative for chest pain, palpitations and leg swelling.  Gastrointestinal: Negative for nausea, vomiting, abdominal pain and diarrhea.  Endocrine: Negative for polydipsia and polyuria.  Genitourinary: Negative for dysuria, frequency, difficulty urinating and pelvic pain.   Musculoskeletal: Negative for arthralgias, back pain, neck pain and neck stiffness.  Skin: Negative for color change and wound.  Allergic/Immunologic: Negative for immunocompromised state.  Neurological: Positive for weakness. Negative for facial asymmetry, numbness and headaches.  Hematological: Does not bruise/bleed easily.  Psychiatric/Behavioral: Negative for confusion and agitation.      Allergies  Review of patient's allergies indicates no known allergies.  Home Medications   Prior to Admission medications   Medication Sig Start Date End Date Taking? Authorizing Provider  amiodarone (PACERONE) 200 MG tablet Take 200 mg by mouth daily.    Yes Historical Provider, MD  desloratadine (CLARINEX) 5 MG tablet Take 5 mg by mouth daily.   Yes Historical Provider, MD  fluticasone (FLONASE) 50 MCG/ACT nasal spray Place 1 drop into both nostrils at bedtime.  06/26/10  Yes Historical Provider, MD  folic acid (FOLVITE) 1 MG tablet Take 1 tablet by mouth daily. 08/31/10  Yes Historical Provider, MD  furosemide (LASIX) 80 MG tablet Take 80 mg by mouth 2 (two) times daily.   Yes Historical Provider, MD  levothyroxine (SYNTHROID, LEVOTHROID) 50 MCG tablet Take 50 mcg by mouth daily before breakfast.   Yes Historical Provider, MD  lovastatin (MEVACOR) 40 MG tablet Take 40 mg by mouth daily.  08/12/10  Yes Historical Provider, MD  metolazone (ZAROXOLYN) 2.5 MG tablet Take 2.5 mg by mouth as needed.   Yes Historical Provider, MD  metoprolol tartrate (LOPRESSOR) 25 MG tablet Take 12.5 mg by mouth Twice daily.  08/31/10  Yes Historical Provider, MD  OVER THE COUNTER MEDICATION Take 1 capsule by mouth at bedtime. Equate sleep aid (blue capsule)   Yes Historical Provider, MD  potassium chloride SA (K-DUR,KLOR-CON) 20 MEQ tablet Take 40 mEq by mouth 2 (two) times daily. 04/02/13  Yes Leroy SeaPrashant K Singh, MD  warfarin (COUMADIN) 1 MG tablet Take 2 mg by mouth at bedtime.   Yes Historical Provider, MD   BP 123/69   Pulse 76  Temp(Src) 98.1 F (36.7 C) (Oral)  Resp 20  Ht 5\' 5"  (1.651 m)  Wt 189 lb 14.4 oz (86.138 kg)  BMI 31.60 kg/m2  SpO2 94% Physical Exam  Constitutional: She is oriented to person, place, and time. She appears well-developed and well-nourished. No distress.  HENT:  Head: Normocephalic and atraumatic.  Mouth/Throat: No oropharyngeal exudate.  Eyes: Pupils are equal, round, and reactive to light.  Neck: Normal range of motion. Neck supple.  Cardiovascular: Normal rate, regular rhythm and normal heart sounds.  Exam reveals no gallop and no friction rub.   No murmur heard. Pulmonary/Chest: Effort normal and breath sounds normal. No respiratory distress. She has no wheezes. She has no rales.  Abdominal: Soft. Bowel sounds are normal. She exhibits no distension and no mass. There is no tenderness. There is no rebound and no guarding.  Musculoskeletal: Normal range of motion. She exhibits no edema and no tenderness.  Neurological: She is alert and oriented to person, place, and time. She displays no  tremor. No cranial nerve deficit or sensory deficit. She exhibits abnormal muscle tone. Coordination and gait normal. GCS eye subscore is 4. GCS verbal subscore is 5. GCS motor subscore is 6.  4/5 strength BLLE  Skin: Skin is warm and dry.  Psychiatric: She has a normal mood and affect.    ED Course  Procedures (including critical care time) Labs Review Labs Reviewed  CBC WITH DIFFERENTIAL - Abnormal; Notable for the following:    RBC 3.54 (*)    Hemoglobin 10.6 (*)    HCT 34.6 (*)    RDW 16.5 (*)    All other components within normal limits  COMPREHENSIVE METABOLIC PANEL - Abnormal; Notable for the following:    Glucose, Bld 108 (*)    BUN 44 (*)    Creatinine, Ser 2.43 (*)    Albumin 2.8 (*)    AST 56 (*)    ALT 47 (*)    GFR calc non Af Amer 19 (*)    GFR calc Af Amer 22 (*)    All other components within normal limits  URINALYSIS, ROUTINE W REFLEX MICROSCOPIC - Abnormal;  Notable for the following:    APPearance CLOUDY (*)    Hgb urine dipstick MODERATE (*)    Urobilinogen, UA 2.0 (*)    Leukocytes, UA LARGE (*)    All other components within normal limits  HEPATIC FUNCTION PANEL - Abnormal; Notable for the following:    Albumin 2.7 (*)    AST 43 (*)    ALT 38 (*)    All other components within normal limits  TSH - Abnormal; Notable for the following:    TSH 26.430 (*)    All other components within normal limits  BASIC METABOLIC PANEL - Abnormal; Notable for the following:    BUN 42 (*)    Creatinine, Ser 2.38 (*)    GFR calc non Af Amer 19 (*)    GFR calc Af Amer 22 (*)    All other components within normal limits  CBC - Abnormal; Notable for the following:    RBC 3.20 (*)    Hemoglobin 9.4 (*)    HCT 30.9 (*)    RDW 16.4 (*)    All other components within normal limits  PROTIME-INR - Abnormal; Notable for the following:    Prothrombin Time 24.9 (*)    INR 2.34 (*)    All other components within normal limits  PROTIME-INR - Abnormal; Notable for the following:    Prothrombin Time 29.1 (*)    INR 2.87 (*)    All other components within normal limits  URINE CULTURE  URINE MICROSCOPIC-ADD ON  CK  TROPONIN I  I-STAT CG4 LACTIC ACID, ED    Imaging Review Dg Chest 2 View  10/03/2013   CLINICAL DATA:  Weakness  EXAM: CHEST  2 VIEW  COMPARISON:  03/30/2013  FINDINGS: A defibrillator is again identified as are postsurgical changes. The cardiac shadow is stable. The lungs are well aerated bilaterally without focal infiltrate or sizable effusion. No acute bony abnormality is seen.  IMPRESSION: No acute abnormality noted.   Electronically Signed   By: Alcide Clever M.D.   On: 10/03/2013 18:09     EKG Interpretation   Date/Time:  Thursday October 03 2013 15:49:13 EDT Ventricular Rate:  82 PR Interval:    QRS Duration: 113 QT Interval:  481 QTC Calculation: 562 R Axis:   80 Text Interpretation:  Accelerated junctional rhythm Ventricular  bigeminy  Borderline  intraventricular conduction delay Low voltage, precordial leads  Prior EKG w/ Atrial paced rhythm Confirmed by DOCHERTY  MD, MEGAN [id number])  on 10/03/2013 7:22:13 PM      MDM   Final diagnoses:  UTI (lower urinary tract infection)    Pt is a 73 y.o. female with Pmhx as above who presents with 1 week of gradual onset weakness, much worse since this morning. Pt unable to get OOB this morning. No focal neuro complaints. Denies fever, resp symptoms, CP, ab pain, d/a, urinary symptoms. She had 1 day of n/v 6 days ago, but none since. She has chronic LE wounds which she follows at a wound clinic for.  She has been tolerating liquids well, but has had little solids. On PE, VSS, pt in NAD, afebrile. Cardipulm exam & abdominal exam benign. Chronic indwelling foley w/ sediment & cloudy urine. Skin exam w/ chronic appearing wounds of RLE, no surrounding infection. Also has candidal rash under R panus.  No focal neuro findings. W/u included unremarkable CXR, infected UA w/ large leukocytes, 21-50 WBCs after foley changed out, BUN/Cr stable from baseline, nml WBC, and hb stable from baseline. EKG w/ bigeminy that has replaced an atrial paced rhythm seen on prior.  I suspect UTI is cause of symptoms. IV rocephin given.  As pt has been too weak to get OOB at home and does not have regular home health at home, [facility] consulted for admission for observation.        Shanna Cisco, MD 10/04/13 [phone]

## 2013-10-03 NOTE — ED Notes (Signed)
MD at bedside. 

## 2013-10-03 NOTE — Progress Notes (Signed)
ANTICOAGULATION CONSULT NOTE - Initial Consult  Pharmacy Consult for Coumadin Indication: atrial fibrillation  No Known Allergies  Patient Measurements: Height: 5\' 5"  (165.1 cm) Weight: 189 lb 14.4 oz (86.138 kg) IBW/kg (Calculated) : 57  Vital Signs: Temp: 97.2 F (36.2 C) (06/11 2037) Temp src: Oral (06/11 2037) BP: 110/71 mmHg (06/11 2037) Pulse Rate: 70 (06/11 2037)  Labs:  Recent Labs  10/03/13 1648  HGB 10.6*  HCT 34.6*  PLT 256  CREATININE 2.43*    Estimated Creatinine Clearance: 22.3 ml/min (by C-G formula based on Cr of 2.43).   Medical History: Past Medical History  Diagnosis Date  . NICM (nonischemic cardiomyopathy)     EF 35%; cath 2/12 no CAD  . Systolic CHF, chronic   . Severe mitral regurgitation     s/p complex mitral valve repair with cox maze + LAA clipping, removal of RV lead, and implantation of CRT-D in abdomen  . Cardiac arrest - ventricular fibrillation     in 1990s  . Atrial fibrillation     amiodarone  . HTN (hypertension)   . HLD (hyperlipidemia)   . Achalasia     s/p dilation  . Recurrent UTI   . Obesity   . Arthritis   . Anoxic brain injury 1996    s/p cardiac arrest    Assessment:   On Coumadin 2 mg daily prior to admission for atrial fibrillation.  (Uses 1 mg tablets at home.)   To continue as inpatient.   Patient/family report last outpatient PT/INR was therapeutic about 2 weeks ago at Dr. York Spaniel office. They also reports stable dosing and INRs. She has not taken her Coumadin yet today.   No PT/INR yet.  Transaminases slightly elevated today; for recheck in am. As noted, on amiodarone and statin (Simvastatin 20 mg inpatient for outpatient Lovastatin 40 mg).  Goal of Therapy:  INR 2-3 Monitor platelets by anticoagulation protocol: Yes   Plan:   PT/INR tonight and daily.  Will order Coumadin dose for tonight when results are available.   Follow up LFTs with you.  Dennie Fetters, [place] Pager:  [phone] 10/03/2013,8:57 PM  Addendum:  - INR 2.34, at goal.    - Will give usual Coumadin dose of 2 mg tonight.  Hilarie Fredrickson, RPh 10/03/2013 10:53 PM

## 2013-10-03 NOTE — ED Notes (Signed)
Pt to ED via EMS from with c/o weakness, onset today. Pt states she usually get up to go have breakfast with family but she was not able today. Pt negative for stroke symptoms and denies pain. Strong urine smell from urinary catheter noted.

## 2013-10-04 LAB — HEPATIC FUNCTION PANEL
ALBUMIN: 2.7 g/dL — AB (ref 3.5–5.2)
ALT: 38 U/L — AB (ref 0–35)
AST: 43 U/L — ABNORMAL HIGH (ref 0–37)
Alkaline Phosphatase: 77 U/L (ref 39–117)
Bilirubin, Direct: <0.2 mg/dL (ref 0.0–0.3)
TOTAL PROTEIN: 6.6 g/dL (ref 6.0–8.3)
Total Bilirubin: 0.4 mg/dL (ref 0.3–1.2)

## 2013-10-04 LAB — URINE CULTURE: Colony Count: 70000

## 2013-10-04 LAB — CBC
HEMATOCRIT: 30.9 % — AB (ref 36.0–46.0)
HEMOGLOBIN: 9.4 g/dL — AB (ref 12.0–15.0)
MCH: 29.4 pg (ref 26.0–34.0)
MCHC: 30.4 g/dL (ref 30.0–36.0)
MCV: 96.6 fL (ref 78.0–100.0)
Platelets: 238 10*3/uL (ref 150–400)
RBC: 3.2 MIL/uL — AB (ref 3.87–5.11)
RDW: 16.4 % — AB (ref 11.5–15.5)
WBC: 4.6 10*3/uL (ref 4.0–10.5)

## 2013-10-04 LAB — BASIC METABOLIC PANEL
BUN: 42 mg/dL — ABNORMAL HIGH (ref 6–23)
CHLORIDE: 101 meq/L (ref 96–112)
CO2: 28 meq/L (ref 19–32)
CREATININE: 2.38 mg/dL — AB (ref 0.50–1.10)
Calcium: 8.6 mg/dL (ref 8.4–10.5)
GFR calc non Af Amer: 19 mL/min — ABNORMAL LOW (ref >90)
GFR, EST AFRICAN AMERICAN: 22 mL/min — AB (ref >90)
Glucose, Bld: 98 mg/dL (ref 70–99)
POTASSIUM: 4 meq/L (ref 3.7–5.3)
SODIUM: 143 meq/L (ref 137–147)

## 2013-10-04 LAB — PROTIME-INR
INR: 2.87 — AB (ref 0.00–1.49)
PROTHROMBIN TIME: 29.1 s — AB (ref 11.6–15.2)

## 2013-10-04 MED ORDER — WARFARIN SODIUM 1 MG PO TABS
1.0000 mg | ORAL_TABLET | Freq: Once | ORAL | Status: AC
  Administered 2013-10-04: 1 mg via ORAL
  Filled 2013-10-04: qty 1

## 2013-10-04 MED ORDER — COLLAGENASE 250 UNIT/GM EX OINT
TOPICAL_OINTMENT | Freq: Every day | CUTANEOUS | Status: DC
  Administered 2013-10-04: 13:00:00 via TOPICAL
  Filled 2013-10-04: qty 30

## 2013-10-04 NOTE — Clinical Social Work Psychosocial (Signed)
Clinical Social Work Department BRIEF PSYCHOSOCIAL ASSESSMENT 10/04/2013  Patient:  Margaret [name], Margaret [name]     Account Number:  [id number]     Admit date:  10/03/2013  Clinical Social Worker:  Lovey Newcomer  Date/Time:  10/04/2013 10:15 AM  Referred by:  Physician  Date Referred:  10/04/2013 Referred for  SNF Placement   Other Referral:   Interview type:  Patient Other interview type:   Patient, daughter, and husband interviewed at bedside to complete assessment.    PSYCHOSOCIAL DATA Living Status:  HUSBAND Admitted from facility:   Level of care:   Primary support name:  Vickie and Horace Primary support relationship to patient:  FAMILY Degree of support available:   Support is strong.    CURRENT CONCERNS Current Concerns  Post-Acute Placement   Other Concerns:    SOCIAL WORK ASSESSMENT / PLAN CSW notified by RN that patient is no longer refusing SNF placement and would like to speak with CSW. CSW met with patient and family at bedside to complete assessment. Patient states, "I don't really want to go but I guess I have no choice." CSW stressed that patient does have a choice to decline SNF but emphasized that treatment team believes this would be the safest option for patient. Patient states that she wants [name]. CSW explained SNF search/placement process and explained that a bed at her preferred facility cannot be guaranteed. CSW explained Medicare SNF benefits to patient and family and answered their questions. Patient seems unhappy about having to go to SNF, but does understand this would be the safest option for her.   Assessment/plan status:  Psychosocial Support/Ongoing Assessment of Needs Other assessment/ plan:   Complete FL2, Fax, PASRR   Information/referral to community resources:   CSW contact information and SNF list given.    PATIENT'S/FAMILY'S RESPONSE TO PLAN OF CARE: Patient and family plan for a DC to SNF. Patient and  family were engaged in assessment. CSW will assist with DC when appropriate.       Liz Beach MSW, [place], [place], [phone]

## 2013-10-04 NOTE — Progress Notes (Signed)
PROGRESS NOTE  Margaret [name] ZOX:[id number] DOB: 12/03/1940 DOA: 10/03/2013 PCP: Allean FoundSMITH,CANDACE THIELE, MD  HPI/Subjective: Margaret PloverCarolyn [name] is a 73 y.o female who presented to ED due to increasing weakness x 1 day. She has a history of nonischemic cardiomyopathy, mitral valve repair, and AFib requiring Coumadin and ICD. She has an indwelling foley catheter with chronic recurrent UTI. She reports wound on right leg for 3 months caused from pressure/ace wraps for which she goes to a wound care clinic. She denied chest pain, shortness of breath, headache, nausea, vomiting, abdominal pain, diarrhea, fevers, night sweats. UA performed in ED was consistent with UTI. She was started on Rocephin and urine cultures are pending.   Pt agreed to go to rehab facility after discharge.   Assessment/Plan:    UTI recurrent problem due to chronic indwelling Foley catheter. - Foley changed in ER, on empiric Rocephin follow cultures.    Right leg wound - present for 3 months due to pressure/ace wraps, minimal surrounding redness, does not look overtly infected. [facility]     Mildly elevated LFTs - Minimally elevated, trend is improving, she is on statin and amiodarone.  -Request PCP to monitor LFT trend closely, monitor medications, if needed outpatient GI workup.     Chronic AFib -Rate controlled and on Coumadin, INR between 2-3 - Continue amiodarone and beta blocker for rate control.    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 - Last EF was 35%, she is currently compensated. We will continue home dose diuretic, beta blocker and amiodarone unchanged.      CKD Stage IV  - BUN/creat and GFR are stable at baseline, baseline creatinine around 2.5-2.6     Anemia of chronic disease  - no acute issues monitor.       DVT Prophylaxis:  Coumadin  Code Status: Full Family Communication:  daughter at bedside  Disposition Plan: inpatient today, plan for rehab facility at discharge    Consultants:     Procedures:  None  Antibiotics: Anti-infectives   Start     Dose/Rate Route Frequency Ordered Stop   10/04/13 1900  cefTRIAXone (ROCEPHIN) 1 g in dextrose 5 % 50 mL IVPB     1 g 100 mL/hr over 30 Minutes Intravenous Every 24 hours 10/03/13 2024     10/03/13 1845  cefTRIAXone (ROCEPHIN) 1 g in dextrose 5 % 50 mL IVPB     1 g 100 mL/hr over 30 Minutes Intravenous  Once 10/03/13 1834 10/03/13 1940      Objective: Filed Vitals:   10/03/13 1930 10/03/13 2037 10/04/13 0530 10/04/13 0958  BP: 109/52 110/71 123/69 102/67  Pulse: 70 70 76 70  Temp:  97.2 F (36.2 C) 98.1 F (36.7 C)   TempSrc:  Oral Oral   Resp: 21 20 20    Height:  5\' 5"  (1.651 m)    Weight:  86.138 kg (189 lb 14.4 oz) 86.138 kg (189 lb 14.4 oz)   SpO2: 100% 97% 94%     Intake/Output Summary (Last 24 hours) at 10/04/13 1019 Last data filed at 10/04/13 1007  Gross per 24 hour  Intake    891 ml  Output    800 ml  Net     91 ml   Filed Weights   10/03/13 2037 10/04/13 0530  Weight: 86.138 kg (189 lb 14.4 oz) 86.138 kg (189 lb 14.4 oz)    Exam: General: WDWN, NAD,  appears stated age, pleasant and smiling HEENT:  EOMI, Anicteic Sclera. Normocephalic, atraumatic.  Neck: Supple, no JVD, no masses Cardiovascular: Regular rate, S1 S2 auscultated, no gallops.  Respiratory: Clear to auscultation anterior to posterior bilaterally. Symmetric chest rise. Respiratory effort normal.  Abdomen: Soft, non tender. Positive bowel sounds. No rigidity or guarding. Indwelling catheter changed in the ER Extremities: Warm and dry. No ecchymosis.  Neuro: AAOx2, cranial nerves grossly intact. Movement of upper extremities is smooth and coordinated.  Skin: Wound on superiomedial aspect of right lower leg approximately 5x3 cm, oval shaped without obvious drainage. No odor. Mild erythema and dry flaking skin  surrounding wound. Medial aspect of lower leg is hyperpigmented.   Psych: Normal affect and demeanor with intact judgement and insight.    Data Reviewed: Basic Metabolic Panel:  Recent Labs Lab 10/03/13 1648 10/04/13 0500  NA 140 143  K 4.3 4.0  CL 99 101  CO2 27 28  GLUCOSE 108* 98  BUN 44* 42*  CREATININE 2.43* 2.38*  CALCIUM 8.4 8.6   Liver Function Tests:  Recent Labs Lab 10/03/13 1648 10/04/13 0500  AST 56* 43*  ALT 47* 38*  ALKPHOS 88 77  BILITOT 0.5 0.4  PROT 7.3 6.6  ALBUMIN 2.8* 2.7*   CBC:  Recent Labs Lab 10/03/13 1648 10/04/13 0500  WBC 5.3 4.6  NEUTROABS 3.3  --   HGB 10.6* 9.4*  HCT 34.6* 30.9*  MCV 97.7 96.6  PLT 256 238   Cardiac Enzymes:  Recent Labs Lab 10/03/13 2143  CKTOTAL 95  TROPONINI <0.30   BNP (last 3 results)  Recent Labs  03/02/13 2245 03/30/13 1815  PROBNP 1436.0* 1353.0*    Lab Results  Component Value Date   INR 2.87* 10/04/2013   INR 2.34* 10/03/2013   INR 2.53* 04/01/2013    Studies: Dg Chest 2 View  10/03/2013   CLINICAL DATA:  Weakness  EXAM: CHEST  2 VIEW  COMPARISON:  03/30/2013  FINDINGS: A defibrillator is again identified as are postsurgical changes. The cardiac shadow is stable. The lungs are well aerated bilaterally without focal infiltrate or sizable effusion. No acute bony abnormality is seen.  IMPRESSION: No acute abnormality noted.   Electronically Signed   By: Alcide Clever M.D.   On: 10/03/2013 18:09    Scheduled Meds: . amiodarone  200 mg Oral Daily  . cefTRIAXone (ROCEPHIN)  IV  1 g Intravenous Q24H  . fluticasone  2 spray Each Nare QHS  . folic acid  1 mg Oral Daily  . furosemide  80 mg Oral BID  . levothyroxine  50 mcg Oral QAC breakfast  . loratadine  10 mg Oral Daily  . metoprolol tartrate  12.5 mg Oral BID  . potassium chloride SA  40 mEq Oral BID  . simvastatin  20 mg Oral q1800  . sodium chloride  3 mL Intravenous Q12H  . warfarin  1 mg Oral ONCE-1800  . Warfarin - Pharmacist  Dosing Inpatient   Does not apply q1800   Continuous Infusions:   Principal Problem:   Weakness Active Problems:   ICD-dual-chamber-[employer]   Atrial fibrillation   Chronic kidney disease (CKD), stage IV (severe)   UTI (lower urinary tract infection)   Elevated LFTs    Signed, [place], PA-S   [facility] Pager [phone]. If 7PM-7AM, please contact night-coverage at [email or web address], password [facility] 10/04/2013, 10:19 AM  LOS: 1 day       I have directly reviewed the clinical findings, lab, imaging  studies and management of this patient in detail. I have interviewed and examined the patient and agree with the documentation,  as recorded by the Physician extender/Student.  Leroy Sea M.D on 10/04/2013 at 10:52 AM  [facility] Office  [phone]

## 2013-10-04 NOTE — Progress Notes (Signed)
Utilization review completed.  

## 2013-10-04 NOTE — Clinical Social Work Placement (Signed)
Clinical Social Work Department CLINICAL SOCIAL WORK PLACEMENT NOTE 10/04/2013  Patient:  Margaret [name], Margaret [name]  Account Number:  [id number] Admit date:  10/03/2013  Clinical Social Worker:  Cherre Blanc, [place]  Date/time:  10/04/2013 10:20 AM  Clinical Social Work is seeking post-discharge placement for this patient at the following level of care:   SKILLED NURSING   (*CSW will update this form in Epic as items are completed)   10/04/2013  Patient/family provided with [facility] Department of Clinical Social Work's list of facilities offering this level of care within the geographic area requested by the patient (or if unable, by the patient's family).  10/04/2013  Patient/family informed of their freedom to choose among providers that offer the needed level of care, that participate in Medicare, Medicaid or managed care program needed by the patient, have an available bed and are willing to accept the patient.  10/04/2013  Patient/family informed of [facility]' ownership interest in [facility], as well as of the fact that they are under no obligation to receive care at this facility.  PASARR submitted to EDS on 10/04/2013 PASARR number received on 10/04/2013  FL2 transmitted to all facilities in geographic area requested by pt/family on  10/04/2013 FL2 transmitted to all facilities within larger geographic area on   Patient informed that his/her managed care company has contracts with or will negotiate with  certain facilities, including the following:     Patient/family informed of bed offers received:  10/04/2013 Patient chooses bed at [facility] Physician recommends and patient chooses bed at    Patient to be transferred to [facility] on   Patient to be transferred to facility by  Patient and family notified of transfer on  Name of family member notified:    The following physician request were entered in Epic:   Additional Comments:    Roddie Mc MSW, [place], [place], [phone]

## 2013-10-04 NOTE — Progress Notes (Signed)
ANTICOAGULATION CONSULT NOTE - Follow Up Consult  Pharmacy Consult for Coumadin Indication: atrial fibrillation  No Known Allergies  Patient Measurements: Height: 5\' 5"  (165.1 cm) Weight: 189 lb 14.4 oz (86.138 kg) IBW/kg (Calculated) : 57  Vital Signs: Temp: 98.1 F (36.7 C) (06/12 0530) Temp src: Oral (06/12 0530) BP: 123/69 mmHg (06/12 0530) Pulse Rate: 76 (06/12 0530)  Labs:  Recent Labs  10/03/13 1648 10/03/13 2143 10/04/13 0500  HGB 10.6*  --  9.4*  HCT 34.6*  --  30.9*  PLT 256  --  238  LABPROT  --  24.9* 29.1*  INR  --  2.34* 2.87*  CREATININE 2.43*  --  2.38*  CKTOTAL  --  95  --   TROPONINI  --  <0.30  --     Estimated Creatinine Clearance: 22.8 ml/min (by C-G formula based on Cr of 2.38).  Assessment: 72yof continues on coumadin for afib. INR is therapeutic at 2.87, but trending up. Also continues on po amiodarone as pta. CBC is stable. No bleeding reported.  Goal of Therapy:  INR 2-3 Monitor platelets by anticoagulation protocol: Yes   Plan:  1) Coumadin 1mg  x 1 tonight 2) INR in AM  Fredrik Rigger 10/04/2013,9:42 AM

## 2013-10-04 NOTE — Evaluation (Signed)
Physical Therapy Evaluation Patient Details Name: Margaret [name] MRN: [id number] DOB: 08/18/1940 Today's Date: 10/04/2013   History of Present Illness  73 yo with PMHx of NICM, CHF, Mitral valve repair, HTN, recurrent UTI with indwelling catheter, arthritis, anoxic brain injury 1996, ICD, Afib admitted with generalized weakness  Clinical Impression  Pt pleasant but states she has tried HHPT and OPPT previously and has no desire for these. Pt has not been to SNF and would recommend SNF unless family can continue to provide 24hr mod assist for all mobility. Family not present today to confirm assist available or PLOF. Pt reports general weakness but vague in stating whether she is truly at baseline or weaker. Pt would benefit from acute therapy to maximize mobility, strength, balance and function to decrease burden of care. Pt reports no desire to attempt to walk again. RN and MD present for transfer and aware of mobility.     Follow Up Recommendations SNF;Supervision/Assistance - 24 hour, pt reports she will refuse and return home with spouse    Equipment Recommendations  None recommended by PT    Recommendations for Other Services       Precautions / Restrictions Precautions Precautions: Fall Precaution Comments: RLE wound Restrictions Weight Bearing Restrictions: No      Mobility  Bed Mobility Overal bed mobility: Needs Assistance Bed Mobility: Rolling;Sidelying to Sit Rolling: Mod assist Sidelying to sit: Mod assist       General bed mobility comments: cues for sequence for use of rail to roll and sit from side with assist of pad at pelvis to roll and trunk elevation assist. min assist to scoot to EOB  Transfers Overall transfer level: Needs assistance   Transfers: Sit to/from Stand;Stand Pivot Transfers Sit to Stand: Mod assist Stand pivot transfers: Mod assist       General transfer comment: attempted standing the way pt states family assist with holding her hands  but unable to achieve anterior translation. Utilized gait belt with pt holding therapist arms with mod assist to stand with significant assist for anterior translation and balance assist to pivot to chair with max cues throughout  Ambulation/Gait                Stairs            Wheelchair Mobility    Modified Rankin (Stroke Patients Only)       Balance Overall balance assessment: Needs assistance   Sitting balance-Leahy Scale: Fair       Standing balance-Leahy Scale: Zero                               Pertinent Vitals/Pain  pt reports pain at RLE wound with leg elevated on pillow end of session    Home Living Family/patient expects to be discharged to:: Private residence Living Arrangements: Spouse/significant other Available Help at Discharge: Family;Available 24 hours/day Type of Home: House Home Access: Ramped entrance     Home Layout: One level Home Equipment: Bedside commode;Cane - single point;Hospital bed;Wheelchair - manual      Prior Function Level of Independence: Needs assistance   Gait / Transfers Assistance Needed: Mod A with transfers with family standing and pulling her hands from the front  ADL's / Homemaking Assistance Needed: dgtr assists her with bathing and dressing and family does all homemaking  Comments: pt states she hasn't walked for months and only transfers bed to chair  Hand Dominance   Dominant Hand: Right    Extremity/Trunk Assessment   Upper Extremity Assessment: Generalized weakness           Lower Extremity Assessment: Generalized weakness      Cervical / Trunk Assessment: Kyphotic  Communication   Communication: No difficulties  Cognition Arousal/Alertness: Awake/alert Behavior During Therapy: Flat affect Overall Cognitive Status: No family/caregiver present to determine baseline cognitive functioning Area of Impairment: Orientation;Attention;Memory;Safety/judgement;Problem  solving Orientation Level: Disoriented to;Time;Place Current Attention Level: Focused Memory: Decreased short-term memory   Safety/Judgement: Decreased awareness of safety   Problem Solving: Slow processing;Requires verbal cues;Requires tactile cues      General Comments      Exercises        Assessment/Plan    PT Assessment Patient needs continued PT services  PT Diagnosis Generalized weakness;Altered mental status   PT Problem List Decreased strength;Decreased cognition;Decreased activity tolerance;Decreased balance;Decreased mobility;Decreased skin integrity;Obesity  PT Treatment Interventions Functional mobility training;Therapeutic activities;Therapeutic exercise;Patient/family education;DME instruction;Balance training   PT Goals (Current goals can be found in the Care Plan section) Acute Rehab PT Goals Patient Stated Goal: return home PT Goal Formulation: With patient Time For Goal Achievement: 10/18/13 Potential to Achieve Goals: Fair    Frequency Min 2X/week   Barriers to discharge Decreased caregiver support      Co-evaluation               End of Session Equipment Utilized During Treatment: Gait belt Activity Tolerance: Patient tolerated treatment well Patient left: in chair;with call bell/phone within reach;with chair alarm set;with nursing/sitter in room Nurse Communication: Mobility status;Precautions         Time: [id number] PT Time Calculation (min): 21 min   Charges:   PT Evaluation $Initial PT Evaluation Tier I: 1 Procedure PT Treatments $Therapeutic Activity: 8-22 mins   PT G Codes:          Delorse Lekabor, Kynsli Haapala Beth 10/04/2013, 9:11 AM Delaney MeigsMaija Tabor Gemayel Mascio, PT [phone]

## 2013-10-04 NOTE — Consult Note (Signed)
WOC wound consult note Reason for Consult: evaluation of LE wound.  Pt and daughter report the history of her RLE wound. She had some blistering that developed on the RLE and then was placed into compression that was ill fitting and caused a device related pressure ulcer on the medial calf. She is followed exclusively now by the wound care center (Dr. Leanord Hawking) and does not have HHRN invlolvment Wound type: device related pressure ulcer right medial calf and posterior calf. Pressure Ulcer POA: Yes  Measurement: Medial calf: 3cm x 5cm x 1cm; two adjacent wounds 1.5cm x 0.5cmx 0.2cm and 0.5cm 0.5cm x 0.2cm Posterior calf: 1.0cm x 1.5cm  And 2.0cm x 1.0cm  Wound HUT:[id number] wounds are clean, dry, pink; the medial calf wound has a fair amount of dark necrotic tissue Drainage (amount, consistency, odor) moderate serous drainage, no odor Periwound: intact, some edema noted but not extreme. No erhtyma or s/s of infection. Dressing procedure/placement/frequency: will add enzymatic debridement ointment for the necrotic tissue, cover with moist gauze and implement dry compression wraps. Will need these changed along with topical wound care again on Tuesday/Friday change schedule.  Discussed POC with patient and bedside nurse.  Re consult if needed, will not follow at this time. Thanks  Dnyla Antonetti [employer], CWOCN [phone])

## 2013-10-04 NOTE — Progress Notes (Signed)
Pt admitted to unit from ED. Pt A&O & VS stable. Pt oriented to unit with call bell within reach. Family currently at bedside. Will continue to monitor.

## 2013-10-05 LAB — PROTIME-INR
INR: 2.5 — AB (ref 0.00–1.49)
Prothrombin Time: 26.2 seconds — ABNORMAL HIGH (ref 11.6–15.2)

## 2013-10-05 MED ORDER — DOCUSATE SODIUM 100 MG PO CAPS
200.0000 mg | ORAL_CAPSULE | Freq: Two times a day (BID) | ORAL | Status: DC
  Administered 2013-10-05 – 2013-10-07 (×3): 200 mg via ORAL
  Filled 2013-10-05 (×5): qty 2

## 2013-10-05 MED ORDER — WARFARIN SODIUM 2 MG PO TABS
2.0000 mg | ORAL_TABLET | Freq: Once | ORAL | Status: AC
  Administered 2013-10-05: 2 mg via ORAL
  Filled 2013-10-05: qty 1

## 2013-10-05 NOTE — Clinical Social Work Note (Signed)
CSW continues to follow this patient. CSW spoke with patient and husband who was present at bedside. Per patient, everything is going as well as can be expected and both she and husband continue to be agreeable to d/c plan.   Aiken Withem [place], LCSWA Weekend Clinical Social Worker [phone]

## 2013-10-05 NOTE — Progress Notes (Signed)
ANTICOAGULATION CONSULT NOTE - Follow Up Consult  Pharmacy Consult for Coumadin Indication: atrial fibrillation  No Known Allergies  Patient Measurements: Height: 5\' 5"  (165.1 cm) Weight: 198 lb (89.812 kg) IBW/kg (Calculated) : 57  Vital Signs: Temp: 98 F (36.7 C) (06/13 0543) Temp src: Oral (06/13 0543) BP: 109/69 mmHg (06/13 1009) Pulse Rate: 71 (06/13 1009)  Labs:  Recent Labs  10/03/13 1648 10/03/13 2143 10/04/13 0500 10/05/13 0450  HGB 10.6*  --  9.4*  --   HCT 34.6*  --  30.9*  --   PLT 256  --  238  --   LABPROT  --  24.9* 29.1* 26.2*  INR  --  2.34* 2.87* 2.50*  CREATININE 2.43*  --  2.38*  --   CKTOTAL  --  95  --   --   TROPONINI  --  <0.30  --   --     Estimated Creatinine Clearance: 23.3 ml/min (by C-G formula based on Cr of 2.38).  Assessment: 72yof continues on coumadin for afib. INR is therapeutic at 2.5. Also continues on po amiodarone as pta. No bleeding reported.  Home dose: 2mg  daily  Goal of Therapy:  INR 2-3 Monitor platelets by anticoagulation protocol: Yes   Plan:  1) Coumadin 2mg  x 1 tonight 2) INR in AM  Fredrik Rigger 10/05/2013,10:19 AM

## 2013-10-05 NOTE — Progress Notes (Signed)
PROGRESS NOTE  Margaret [name] ZOX:[id number] DOB: 02/28/1941 DOA: 10/03/2013 PCP: Allean FoundSMITH,CANDACE THIELE, MD  HPI/Subjective: Margaret [name] is a 73 y.o female who presented to ED due to increasing weakness x 1 day. She has a history of nonischemic cardiomyopathy, mitral valve repair, and AFib requiring Coumadin and ICD. She has an indwelling foley catheter with chronic recurrent UTI. She reports wound on right leg for 3 months caused from pressure/ace wraps for which she goes to a wound care clinic. She denied chest pain, shortness of breath, headache, nausea, vomiting, abdominal pain, diarrhea, fevers, night sweats. UA performed in ED was consistent with UTI. She was started on Rocephin and urine cultures are pending.   Pt agreed to go to rehab facility after discharge.     Assessment/Plan:     UTI recurrent problem due to chronic indwelling Foley catheter causing acute on chronic generalized weakness and deconditioning. - Foley changed in ER, on empiric Rocephin follow cultures. Continue supportive care for weakness she is chronically weak and requires two-person assist to get out of the bed.     Right leg wound - present for 3 months due to pressure/ace wraps, minimal surrounding redness, does not look overtly infected. [facility] wound [facility]     Mildly elevated LFTs - Minimally elevated, trend is improving, she is on statin and amiodarone.  -Request PCP to monitor LFT trend closely, monitor medications, if needed outpatient GI workup.     Chronic AFib -Rate controlled and on Coumadin, INR between 2-3, Continue amiodarone and beta blocker for rate control.    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 - Last EF was 35%, she is currently compensated. We will continue home dose diuretic, beta blocker and amiodarone unchanged.      CKD Stage IV  - BUN/creat and GFR are stable at baseline,  baseline creatinine around 2.5-2.6     Anemia of chronic disease  - no acute issues monitor.       DVT Prophylaxis:  Coumadin  Lab Results  Component Value Date   INR 2.50* 10/05/2013   INR 2.87* 10/04/2013   INR 2.34* 10/03/2013     Code Status: Full Family Communication: daughter at bedside  Disposition Plan: SNF Monday   Consultants:     Procedures:  None     Objective: Filed Vitals:   10/04/13 0958 10/04/13 1300 10/04/13 2056 10/05/13 0543  BP: 102/67 129/75 100/71 108/72  Pulse: 70 79 72 70  Temp:  97.8 F (36.6 C) 98.5 F (36.9 C) 98 F (36.7 C)  TempSrc:  Oral Oral Oral  Resp:  20 20 17   Height:      Weight:    89.812 kg (198 lb)  SpO2:  96% 95% 96%    Intake/Output Summary (Last 24 hours) at 10/05/13 0812 Last data filed at 10/05/13 0721  Gross per 24 hour  Intake    855 ml  Output   2300 ml  Net  -1445 ml   Filed Weights   10/03/13 2037 10/04/13 0530 10/05/13 0543  Weight: 86.138 kg (189 lb 14.4 oz) 86.138 kg (189 lb 14.4 oz) 89.812 kg (198 lb)    Exam:  General: WDWN, NAD, appears stated age, pleasant and smiling HEENT:  EOMI, Anicteic Sclera. Normocephalic, atraumatic.  Neck: Supple, no JVD, no masses Cardiovascular: Regular rate, S1 S2 auscultated, no gallops.  Respiratory: Clear to auscultation anterior to posterior bilaterally. Symmetric chest rise. Respiratory  effort normal.  Abdomen: Soft, non tender. Positive bowel sounds. No rigidity or guarding. Indwelling catheter changed in the ER Extremities: Warm and dry. No ecchymosis.  Neuro: AAOx2, cranial nerves grossly intact. Movement of upper extremities is smooth and coordinated.  Skin: Wound on superiomedial aspect of right lower leg approximately 5x3 cm, oval shaped without obvious drainage. No odor. Mild erythema and dry flaking skin surrounding wound. Medial aspect of lower leg is hyperpigmented.   Psych: Normal affect and demeanor with intact judgement and insight.     Data Reviewed: Basic Metabolic Panel:  Recent Labs Lab 10/03/13 1648 10/04/13 0500  NA 140 143  K 4.3 4.0  CL 99 101  CO2 27 28  GLUCOSE 108* 98  BUN 44* 42*  CREATININE 2.43* 2.38*  CALCIUM 8.4 8.6   Liver Function Tests:  Recent Labs Lab 10/03/13 1648 10/04/13 0500  AST 56* 43*  ALT 47* 38*  ALKPHOS 88 77  BILITOT 0.5 0.4  PROT 7.3 6.6  ALBUMIN 2.8* 2.7*   CBC:  Recent Labs Lab 10/03/13 1648 10/04/13 0500  WBC 5.3 4.6  NEUTROABS 3.3  --   HGB 10.6* 9.4*  HCT 34.6* 30.9*  MCV 97.7 96.6  PLT 256 238   Cardiac Enzymes:  Recent Labs Lab 10/03/13 2143  CKTOTAL 95  TROPONINI <0.30   BNP (last 3 results)  Recent Labs  03/02/13 2245 03/30/13 1815  PROBNP 1436.0* 1353.0*    Lab Results  Component Value Date   INR 2.50* 10/05/2013   INR 2.87* 10/04/2013   INR 2.34* 10/03/2013    Studies: Dg Chest 2 View  10/03/2013   CLINICAL DATA:  Weakness  EXAM: CHEST  2 VIEW  COMPARISON:  03/30/2013  FINDINGS: A defibrillator is again identified as are postsurgical changes. The cardiac shadow is stable. The lungs are well aerated bilaterally without focal infiltrate or sizable effusion. No acute bony abnormality is seen.  IMPRESSION: No acute abnormality noted.   Electronically Signed   By: Alcide Clever M.D.   On: 10/03/2013 18:09    Scheduled Meds: . amiodarone  200 mg Oral Daily  . cefTRIAXone (ROCEPHIN)  IV  1 g Intravenous Q24H  . collagenase   Topical Daily  . fluticasone  2 spray Each Nare QHS  . folic acid  1 mg Oral Daily  . furosemide  80 mg Oral BID  . levothyroxine  50 mcg Oral QAC breakfast  . loratadine  10 mg Oral Daily  . metoprolol tartrate  12.5 mg Oral BID  . potassium chloride SA  40 mEq Oral BID  . simvastatin  20 mg Oral q1800  . sodium chloride  3 mL Intravenous Q12H  . Warfarin - Pharmacist Dosing Inpatient   Does not apply q1800   Continuous Infusions:   Anti-infectives   Start     Dose/Rate Route Frequency Ordered Stop    10/04/13 1900  cefTRIAXone (ROCEPHIN) 1 g in dextrose 5 % 50 mL IVPB     1 g 100 mL/hr over 30 Minutes Intravenous Every 24 hours 10/03/13 2024     10/03/13 1845  cefTRIAXone (ROCEPHIN) 1 g in dextrose 5 % 50 mL IVPB     1 g 100 mL/hr over 30 Minutes Intravenous  Once 10/03/13 1834 10/03/13 1940      Principal Problem:   Weakness Active Problems:   ICD-dual-chamber-[employer]   Atrial fibrillation   Chronic kidney disease (CKD), stage IV (severe)   UTI (lower urinary tract infection)   Elevated  LFTs    Leroy Sea M.D on 10/05/2013 at 8:12 AM  [facility] Office  [phone]

## 2013-10-05 NOTE — Progress Notes (Signed)
Courtesy visit.  I had received a call from daughter about severe weakness and plans noted. At this point I would stop both the amiodarone and the lovastatin.  She has mild elevation of lfts and these are not critical at this point.   [name] MD [facility]

## 2013-10-06 DIAGNOSIS — I428 Other cardiomyopathies: Secondary | ICD-10-CM

## 2013-10-06 LAB — PROTIME-INR
INR: 2.1 — AB (ref 0.00–1.49)
Prothrombin Time: 22.9 seconds — ABNORMAL HIGH (ref 11.6–15.2)

## 2013-10-06 MED ORDER — WARFARIN SODIUM 4 MG PO TABS
4.0000 mg | ORAL_TABLET | Freq: Once | ORAL | Status: AC
  Administered 2013-10-06: 4 mg via ORAL
  Filled 2013-10-06: qty 1

## 2013-10-06 NOTE — Progress Notes (Signed)
PATIENT DETAILS Name: Margaret [name] Age: 73 y.o. Sex: female Date of Birth: 03-17-1941 Admit Date: 10/03/2013 Admitting Physician Eduard Clos, MD ZOX:[id number] Venetia Constable, MD  Subjective: No major complaints this am.  Assessment/Plan: Principal Problem: Recurrent UTI -has chronic indwelling foley catheter, Foley was changed in the ED. Patient was admitted and started on IV Rocephin, even though UA clearly consistent with UTI, urine culture shows multiple bacterial morphotype's. Clinically improved, weakness slightly better. Will transition to Keflex on discharge. Since already on IV Rocephin, suspect repeating Urine culture will have no further value.  Acute on Chronic Generalized Weakness -secondary to UTI -she is chronically weak and requires two-person assist to get out of the bed. -plans are for SNF on discharge  Right leg wound  - present for 3 months due to pressure/ace wraps, minimal surrounding redness, does not look overtly infected. Continue wound care post discharge follow at [facility] wound care clinic. Seen by wound care RN here in the hospital as well.  Recommendations for Dressing procedure/placement/frequency: add enzymatic debridement ointment for the necrotic tissue, cover with moist gauze and implement dry compression wraps. Will need these changed along with topical wound care again on Tuesday/Friday change schedule.   Mildly elevated LFTs  - Minimally elevated, trend is improving, she was on statin and amiodarone. These have now been discontinued. Patient's primary Cardiologist paid a courtesy visit during this hospitalization and recommended to stop both  Amiodarone and statins. These medications were felt not to be critical at this point.Will need to resume when able -Request PCP to monitor LFT trend closely, monitor medications, if needed outpatient GI workup.  Chronic AFib  -Rate controlled and on Coumadin, INR between 2-3,continue with Metoprolol.  Amiodarone now discontinued.  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  - Last EF was 35%, she is currently compensated. We will continue home dose diuretic, beta blocker  CKD Stage IV  - BUN/creat and GFR are stable at baseline, baseline creatinine around 2.5-2.6  Anemia of chronic disease - no acute issues monitor.  Disposition: Remain inpatient-Suspect SNF in am  DVT Prophylaxis: Not needed as on coumadin.  Code Status: Full code   Family Communication None at bedside  Procedures:  None  CONSULTS:  None  Time spent 40 minutes-which includes 50% of the time with face-to-face with patient/ family and coordinating care related to the above assessment and plan.    MEDICATIONS: Scheduled Meds: . cefTRIAXone (ROCEPHIN)  IV  1 g Intravenous Q24H  . collagenase   Topical Daily  . docusate sodium  200 mg Oral BID  . fluticasone  2 spray Each Nare QHS  . folic acid  1 mg Oral Daily  . furosemide  80 mg Oral BID  . levothyroxine  50 mcg Oral QAC breakfast  . loratadine  10 mg Oral Daily  . metoprolol tartrate  12.5 mg Oral BID  . potassium chloride SA  40 mEq Oral BID  . sodium chloride  3 mL Intravenous Q12H  . Warfarin - Pharmacist Dosing Inpatient   Does not apply q1800   Continuous Infusions:  PRN Meds:.acetaminophen, acetaminophen, ondansetron (ZOFRAN) IV, ondansetron  Antibiotics: Anti-infectives   Start     Dose/Rate Route Frequency Ordered Stop   10/04/13 1900  cefTRIAXone (ROCEPHIN) 1 g in dextrose 5 % 50 mL IVPB     1 g 100 mL/hr over 30 Minutes Intravenous Every 24 hours 10/03/13 2024  10/03/13 1845  cefTRIAXone (ROCEPHIN) 1 g in dextrose 5 % 50 mL IVPB     1 g 100 mL/hr over 30 Minutes Intravenous  Once 10/03/13 1834 10/03/13 1940       PHYSICAL EXAM: Vital signs in last 24 hours: Filed Vitals:   10/05/13 2006 10/06/13 0500 10/06/13 0621 10/06/13 0959  BP: 108/66  117/68 110/50  Pulse: 70  69 63    Temp: 98.3 F (36.8 C)  98 F (36.7 C)   TempSrc: Oral  Oral   Resp: 18  18   Height:      Weight:  89.9 kg (198 lb 3.1 oz)    SpO2: 96%  97%     Weight change: 0.088 kg (3.1 oz) Filed Weights   10/04/13 0530 10/05/13 0543 10/06/13 0500  Weight: 86.138 kg (189 lb 14.4 oz) 89.812 kg (198 lb) 89.9 kg (198 lb 3.1 oz)   Body mass index is 32.98 kg/(m^2).   General: WDWN, NAD, appears stated age, pleasant and smiling  HEENT: EOMI, Anicteic Sclera. Normocephalic, atraumatic.  Neck: Supple, no JVD, no masses  Cardiovascular: Regular rate, S1 S2 auscultated, no gallops.  Respiratory: Clear to auscultation anterior to posterior bilaterally. Symmetric chest rise. Respiratory effort normal.  Abdomen: Soft, non tender. Positive bowel sounds. No rigidity or guarding. Indwelling catheter changed in the ER  Extremities: Warm and dry. No ecchymosis.  Neuro: AAOx2, cranial nerves grossly intact. Movement of upper extremities is smooth and coordinated.  Skin: Wound on superiomedial aspect of right lower leg approximately 5x3 cm, oval shaped without obvious drainage. No odor. Mild erythema and dry flaking skin surrounding wound. Medial aspect of lower leg is hyperpigmented.  Psych: Normal affect and demeanor with intact judgement and insight.    Intake/Output from previous day:  Intake/Output Summary (Last 24 hours) at 10/06/13 1106 Last data filed at 10/06/13 0948  Gross per 24 hour  Intake   1080 ml  Output   1826 ml  Net   -746 ml     LAB RESULTS: CBC  Recent Labs Lab 10/03/13 1648 10/04/13 0500  WBC 5.3 4.6  HGB 10.6* 9.4*  HCT 34.6* 30.9*  PLT 256 238  MCV 97.7 96.6  MCH 29.9 29.4  MCHC 30.6 30.4  RDW 16.5* 16.4*  LYMPHSABS 1.4  --   MONOABS 0.5  --   EOSABS 0.2  --   BASOSABS 0.0  --     Chemistries   Recent Labs Lab 10/03/13 1648 10/04/13 0500  NA 140 143  K 4.3 4.0  CL 99 101  CO2 27 28  GLUCOSE 108* 98  BUN 44* 42*  CREATININE 2.43* 2.38*  CALCIUM 8.4  8.6    CBG: No results found for this basename: GLUCAP,  in the last 168 hours  GFR Estimated Creatinine Clearance: 23.3 ml/min (by C-G formula based on Cr of 2.38).  Coagulation profile  Recent Labs Lab 10/03/13 2143 10/04/13 0500 10/05/13 0450 10/06/13 0533  INR 2.34* 2.87* 2.50* 2.10*    Cardiac Enzymes  Recent Labs Lab 10/03/13 2143  TROPONINI <0.30    No components found with this basename: POCBNP,  No results found for this basename: DDIMER,  in the last 72 hours No results found for this basename: HGBA1C,  in the last 72 hours No results found for this basename: CHOL, HDL, LDLCALC, TRIG, CHOLHDL, LDLDIRECT,  in the last 72 hours  Recent Labs  10/03/13 2143  TSH 26.430*   No results found for this basename:  VITAMINB12, FOLATE, FERRITIN, TIBC, IRON, RETICCTPCT,  in the last 72 hours No results found for this basename: LIPASE, AMYLASE,  in the last 72 hours  Urine Studies No results found for this basename: UACOL, UAPR, USPG, UPH, UTP, UGL, UKET, UBIL, UHGB, UNIT, UROB, ULEU, UEPI, UWBC, URBC, UBAC, CAST, CRYS, UCOM, BILUA,  in the last 72 hours  MICROBIOLOGY: Recent Results (from the past 240 hour(s))  URINE CULTURE     Status: None   Collection Time    10/03/13  4:21 PM      Result Value Ref Range Status   Specimen Description URINE, CLEAN CATCH   Final   Special Requests NONE   Final   Culture  Setup Time     Final   Value: 10/03/2013 22:09     [facility]formed at [facility]   Colony Count     Final   Value: 70,000 COLONIES/ML     [facility]formed at [facility]   Culture     Final   Value: Multiple bacterial morphotypes present, none predominant. Suggest appropriate recollection if clinically indicated.     Performed at [facility]   Report Status 10/04/2013 FINAL   Final    RADIOLOGY STUDIES/RESULTS: Dg Chest 2 View  10/03/2013   CLINICAL DATA:  Weakness  EXAM: CHEST  2 VIEW  COMPARISON:  03/30/2013  FINDINGS: A defibrillator is  again identified as are postsurgical changes. The cardiac shadow is stable. The lungs are well aerated bilaterally without focal infiltrate or sizable effusion. No acute bony abnormality is seen.  IMPRESSION: No acute abnormality noted.   Electronically Signed   By: Alcide CleverMark  Lukens M.D.   On: 10/03/2013 18:09    Jeoffrey MassedGHIMIRE,Kalany Diekmann, MD  [facility] Pager:[phone]  If 7PM-7AM, please contact night-coverage [email or web address] Password [email or web address] 10/06/2013, 11:06 AM   LOS: 3 days   **Disclaimer: This note may have been dictated with voice recognition software. Similar sounding words can inadvertently be transcribed and this note may contain transcription errors which may not have been corrected upon publication of note.**

## 2013-10-06 NOTE — Progress Notes (Signed)
ANTICOAGULATION CONSULT NOTE - Follow Up Consult  Pharmacy Consult for Coumadin Indication: atrial fibrillation  No Known Allergies  Patient Measurements: Height: 5\' 5"  (165.1 cm) Weight: 198 lb 3.1 oz (89.9 kg) IBW/kg (Calculated) : 57  Vital Signs: Temp: 98 F (36.7 C) (06/14 0621) Temp src: Oral (06/14 0621) BP: 110/50 mmHg (06/14 0959) Pulse Rate: 63 (06/14 0959)  Labs:  Recent Labs  10/03/13 1648  10/03/13 2143 10/04/13 0500 10/05/13 0450 10/06/13 0533  HGB 10.6*  --   --  9.4*  --   --   HCT 34.6*  --   --  30.9*  --   --   PLT 256  --   --  238  --   --   LABPROT  --   < > 24.9* 29.1* 26.2* 22.9*  INR  --   < > 2.34* 2.87* 2.50* 2.10*  CREATININE 2.43*  --   --  2.38*  --   --   CKTOTAL  --   --  95  --   --   --   TROPONINI  --   --  <0.30  --   --   --   < > = values in this interval not displayed.  Estimated Creatinine Clearance: 23.3 ml/min (by C-G formula based on Cr of 2.38).  Assessment: 72yof continues on coumadin for afib. INR is therapeutic but trending down over the last several days. Amiodarone has been discontinued so coumadin requirements will probably eventually increase.  Home dose: 2mg  daily  Goal of Therapy:  INR 2-3 Monitor platelets by anticoagulation protocol: Yes   Plan:  1) Increase coumadin to 4mg  x 1 tonight 2) INR in AM  Fredrik Rigger 10/06/2013,11:06 AM

## 2013-10-07 DIAGNOSIS — N184 Chronic kidney disease, stage 4 (severe): Secondary | ICD-10-CM

## 2013-10-07 LAB — PROTIME-INR
INR: 1.98 — AB (ref 0.00–1.49)
PROTHROMBIN TIME: 21.9 s — AB (ref 11.6–15.2)

## 2013-10-07 MED ORDER — DSS 100 MG PO CAPS: 200.0000 mg | ORAL_CAPSULE | Freq: Two times a day (BID) | ORAL | Status: DC

## 2013-10-07 MED ORDER — CIPROFLOXACIN HCL 250 MG PO TABS: 250.0000 mg | ORAL_TABLET | Freq: Every day | ORAL | Status: DC

## 2013-10-07 MED ORDER — WARFARIN SODIUM 3 MG PO TABS
3.0000 mg | ORAL_TABLET | Freq: Once | ORAL | Status: DC
  Filled 2013-10-07: qty 1

## 2013-10-07 NOTE — Care Management Note (Signed)
    Page 1 of 1   10/07/2013     4:13:54 PM CARE MANAGEMENT NOTE 10/07/2013  Patient:  Margaret [name], Margaret [name]   Account Number:  [id number]  Date Initiated:  10/07/2013  Documentation initiated by:  Letha Cape  Subjective/Objective Assessment:   dx poss uti  admit-lives with spouse.     Action/Plan:   pt rec snf   Anticipated DC Date:  10/07/2013   Anticipated DC Plan:  SKILLED NURSING FACILITY  In-house referral  Clinical Social Worker      DC Planning Services  CM consult      Choice offered to / List presented to:             Status of service:  Completed, signed off Medicare Important Message given?  YES (If response is "NO", the following Medicare IM given date fields will be blank) Date Medicare IM given:  10/07/2013 Date Additional Medicare IM given:    Discharge Disposition:  SKILLED NURSING FACILITY  Per UR Regulation:  Reviewed for med. necessity/level of care/duration of stay  If discussed at Long Length of Stay Meetings, dates discussed:    Comments:

## 2013-10-07 NOTE — Progress Notes (Signed)
Pt prepared for d/c to SNF. IV d/c'd. Skin intact except as most recently charted. Vitals are stable. Report called to receiving facility. Pt to be transported by ambulance service. 

## 2013-10-07 NOTE — Discharge Summary (Addendum)
PATIENT DETAILS Name: Margaret [name] Age: 73 y.o. Sex: female Date of Birth: [birth year] MRN: [id number]. Admit Date: 10/03/2013 Admitting Physician: Eduard Clos, MD WUJ:[id number] Venetia Constable, MD  Recommendations for Outpatient Follow-up:  1. Please check PT/INR in 2 days-and adjust coumadin dosage accordingly 2. Please repeat LFT in 1-2 weeks, and consider restarting Amiodarone and Statins 3. See wound care instructions below-normally follows at wound care clinic 4. Please repeat CBC and BMET in one week  PRIMARY DISCHARGE DIAGNOSIS:  Principal Problem:   Weakness Active Problems:   ICD-dual-chamber-[employer]   Atrial fibrillation   Chronic kidney disease (CKD), stage IV (severe)   UTI (lower urinary tract infection)   Elevated LFTs      PAST MEDICAL HISTORY: Past Medical History  Diagnosis Date  . NICM (nonischemic cardiomyopathy)     EF 35%; cath 2/12 no CAD  . Systolic CHF, chronic   . Severe mitral regurgitation     s/p complex mitral valve repair with cox maze + LAA clipping, removal of RV lead, and implantation of CRT-D in abdomen  . Cardiac arrest - ventricular fibrillation     in 1990s  . Atrial fibrillation     amiodarone  . HTN (hypertension)   . HLD (hyperlipidemia)   . Achalasia     s/p dilation  . Recurrent UTI   . Obesity   . Anoxic brain injury 1996    s/p cardiac arrest   . Automatic implantable cardioverter-defibrillator in situ   . Hypothyroidism   . TIA (transient ischemic attack)   . Chronic kidney disease (CKD), stage IV (severe)     [name] 10/03/2013    DISCHARGE MEDICATIONS:   Medication List    STOP taking these medications       amiodarone 200 MG tablet  Commonly known as:  PACERONE     lovastatin 40 MG tablet  Commonly known as:  MEVACOR      TAKE these medications       ciprofloxacin 250 MG tablet  Commonly known as:  CIPRO  Take 1 tablet (250 mg total) by mouth daily with breakfast. For 4  more days from 10/07/13      desloratadine 5 MG tablet  Commonly known as:  CLARINEX  Take 5 mg by mouth daily.     DSS 100 MG Caps  Take 200 mg by mouth 2 (two) times daily.     fluticasone 50 MCG/ACT nasal spray  Commonly known as:  FLONASE  Place 1 drop into both nostrils at bedtime.     folic acid 1 MG tablet  Commonly known as:  FOLVITE  Take 1 tablet by mouth daily.     furosemide 80 MG tablet  Commonly known as:  LASIX  Take 80 mg by mouth 2 (two) times daily.     levothyroxine 50 MCG tablet  Commonly known as:  SYNTHROID, LEVOTHROID  Take 50 mcg by mouth daily before breakfast.     metolazone 2.5 MG tablet  Commonly known as:  ZAROXOLYN  Take 2.5 mg by mouth as needed.     metoprolol tartrate 25 MG tablet  Commonly known as:  LOPRESSOR  Take 12.5 mg by mouth Twice daily.     OVER THE COUNTER MEDICATION  Take 1 capsule by mouth at bedtime. Equate sleep aid (blue capsule)     potassium chloride SA 20 MEQ tablet  Commonly known as:  K-DUR,KLOR-CON  Take 40 mEq by mouth 2 (two) times daily.  warfarin 1 MG tablet  Commonly known as:  COUMADIN  Take 2 mg by mouth at bedtime.        ALLERGIES:  No Known Allergies  BRIEF HPI:  See H&P, Labs, Consult and Test reports for all details in brief,Margaret [name] is a 73 y.o. 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 The cardiac shadow is stable. The lungs are well aerated bilaterally without focal infiltrate or sizable effusion. No acute bony abnormality  is seen.  IMPRESSION: No acute abnormality noted.   Electronically Signed   By: Alcide Clever M.D.   On: 10/03/2013 18:09   PERTINENT LAB RESULTS: CBC: No results found for this basename: WBC, HGB, HCT, PLT,  in the last 72 hours CMET CMP     Component Value Date/Time   NA 143 10/04/2013 0500   K 4.0 10/04/2013 0500   CL 101 10/04/2013 0500   CO2 28 10/04/2013 0500   GLUCOSE 98 10/04/2013 0500   BUN 42* 10/04/2013 0500   CREATININE 2.38* 10/04/2013 0500   CALCIUM 8.6 10/04/2013 0500   PROT 6.6 10/04/2013 0500   ALBUMIN 2.7* 10/04/2013 0500   AST 43* 10/04/2013 0500   ALT 38* 10/04/2013 0500   ALKPHOS 77 10/04/2013 0500   BILITOT 0.4 10/04/2013 0500   GFRNONAA 19* 10/04/2013 0500   GFRAA 22* 10/04/2013 0500    GFR Estimated Creatinine Clearance: 23.3 ml/min (by C-G formula based on Cr of 2.38). No results found for this basename: LIPASE, AMYLASE,  in the last 72 hours No results found for this basename: CKTOTAL, CKMB, CKMBINDEX, TROPONINI,  in the last 72 hours No components found with this basename: POCBNP,  No results found for this basename: DDIMER,  in the last 72 hours No results found for this basename: HGBA1C,  in the last 72 hours No results found for this basename: CHOL, HDL, LDLCALC, TRIG, CHOLHDL, LDLDIRECT,  in the last 72 hours No results found for this basename: TSH, T4TOTAL, FREET3, T3FREE, THYROIDAB,  in the last 72 hours No results found for this basename: VITAMINB12, FOLATE, FERRITIN, TIBC, IRON, RETICCTPCT,  in the last 72 hours Coags:  Recent Labs  10/06/13 0533 10/07/13 0550  INR 2.10* 1.98*   Microbiology: Recent Results (from the past 240 hour(s))  URINE CULTURE     Status: None   Collection Time    10/03/13  4:21 PM      Result Value Ref Range Status   Specimen Description URINE, CLEAN CATCH   Final   Special Requests NONE   Final   Culture  Setup Time     Final   Value: 10/03/2013 22:09     Performed at [facility] Count     Final    Value: 70,000 COLONIES/ML     Performed at [facility]   Culture     Final   Value: Multiple bacterial morphotypes present, none predominant. Suggest appropriate recollection if clinically indicated.     Performed at [facility]   Report Status  10/04/2013 FINAL   Final     BRIEF HOSPITAL COURSE:  Recurrent UTI  -has chronic indwelling foley catheter, Foley was changed in the ED. Patient was admitted and started on IV Rocephin, even though UA clearly consistent with UTI, urine culture shows multiple bacterial morphotype's. Clinically improved, weakness slightly better. Will transition to Ciprofloxacin on discharge. Since was already on IV Rocephin, suspect repeating Urine culture will have no further value.   Acute on Chronic Generalized Weakness  -secondary to UTI  -she is chronically weak and requires two-person assist to get out of the bed.  -plans are for SNF on discharge   Right leg wound  - present for 3 months due to pressure/ace wraps, minimal surrounding redness, does not look overtly infected. Continue wound care post discharge [facility]. Seen by wound care RN here in the hospital as well.   Recommendations for Dressing procedure/placement/frequency: add enzymatic debridement ointment for the necrotic tissue, cover with moist gauze and implement dry compression wraps. Will need these changed along with topical wound care again on Tuesday/Friday change schedule.   Mildly elevated LFTs  - Minimally elevated, trend is improving, she was on statin and amiodarone. These have now been discontinued. Patient's primary Cardiologist paid a courtesy visit during this hospitalization and recommended to stop both Amiodarone and statins. These medications were felt not to be critical at this point.Will need to resume when able once LFT's are normal. -Request PCP to monitor LFT trend closely, monitor medications, if needed outpatient GI workup.   Chronic  AFib  -Rate controlled and on Coumadin, INR between 2-3,continue with Metoprolol. Amiodarone now discontinued. Please arrange follow up with primary cardiologist Dr Donnie Ahoilley in 2 weeks.  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  - Last EF was 35%, she is currently compensated. We will continue home dose diuretic, beta blocker   CKD Stage IV  - BUN/creat and GFR are stable at baseline, baseline creatinine around 2.5-2.6  TODAY-DAY OF DISCHARGE:  Subjective:   Margaret PloverCarolyn [name] today has no headache,no chest abdominal pain,no new weakness tingling or numbness, feels much better wants to go home today.   Objective:   Blood pressure 130/50, pulse 70, temperature 97.2 F (36.2 C), temperature source Oral, resp. rate 20, height 5\' 5"  (1.651 m), weight 89.767 kg (197 lb 14.4 oz), SpO2 95.00%.  Intake/Output Summary (Last 24 hours) at 10/07/13 1000 Last data filed at 10/07/13 0557  Gross per 24 hour  Intake    993 ml  Output   2300 ml  Net  -1307 ml   Filed Weights   10/05/13 0543 10/06/13 0500 10/07/13 0558  Weight: 89.812 kg (198 lb) 89.9 kg (198 lb 3.1 oz) 89.767 kg (197 lb 14.4 oz)    Exam General: WDWN, NAD, appears stated age, pleasant and smiling  HEENT: EOMI, Anicteic Sclera. Normocephalic, atraumatic.  Neck: Supple, no JVD, no masses  Cardiovascular: Regular rate, S1 S2 auscultated, no gallops.  Respiratory: Clear to auscultation anterior to posterior bilaterally. Symmetric chest rise. Respiratory effort normal.  Abdomen: Soft, non tender. Positive bowel sounds. No rigidity or guarding. Indwelling catheter changed in the ER  Extremities: Warm and dry. No ecchymosis.  Neuro: AAOx2, cranial nerves grossly intact. Movement of upper extremities is smooth and coordinated.  Skin: Wound on superiomedial aspect of right lower leg approximately 5x3 cm, oval shaped without obvious drainage. No odor. Mild erythema and dry flaking skin surrounding wound.  Medial aspect of lower  leg is hyperpigmented.  Psych: Normal affect and demeanor with intact judgement and insight.    DISCHARGE CONDITION: Stable  DISPOSITION: SNF  DISCHARGE INSTRUCTIONS:    Activity:  As tolerated with Full fall precautions use walker/cane & assistance as needed  Dressing changes: Add enzymatic debridement ointment for the necrotic tissue, cover with moist gauze and implement dry compression wraps. Will need these changed along with topical wound care again on Tuesday/Friday change schedule.   Diet recommendation: Heart Healthy diet  Discharge Instructions   Call MD for:  persistant nausea and vomiting    Complete by:  As directed      Call MD for:  redness, tenderness, or signs of infection (pain, swelling, redness, odor or green/yellow discharge around incision site)    Complete by:  As directed      Diet - low sodium heart healthy    Complete by:  As directed      Increase activity slowly    Complete by:  As directed            Follow-up Information   Follow up with Allean Found, MD. Schedule an appointment as soon as possible for a visit in 1 week. (after discharge from SNF)    Specialty:  Family Medicine   Contact information:   [address], [address] [place] [address] [phone]       Follow up with [name], MD. Schedule an appointment as soon as possible for a visit in 2 weeks.   Specialty:  Cardiology   Contact information:   [address] [place] [address] [place] [address] [phone]     Total Time spent on discharge equals 45 minutes.  SignedJeoffrey Massed 10/07/2013 10:00 AM  **Disclaimer: This note may have been dictated with voice recognition software. Similar sounding words can inadvertently be transcribed and this note may contain transcription errors which may not have been corrected upon publication of note.**

## 2013-10-07 NOTE — Progress Notes (Signed)
ANTICOAGULATION CONSULT NOTE - Follow Up Consult  Pharmacy Consult for coumadin Indication: atrial fibrillation  No Known Allergies  Patient Measurements: Height: 5\' 5"  (165.1 cm) Weight: 197 lb 14.4 oz (89.767 kg) IBW/kg (Calculated) : 57   Vital Signs: Temp: 97.2 F (36.2 C) (06/15 0554) Temp src: Oral (06/15 0554) BP: 130/50 mmHg (06/15 0913) Pulse Rate: 70 (06/15 0913)  Labs:  Recent Labs  10/05/13 0450 10/06/13 0533 10/07/13 0550  LABPROT 26.2* 22.9* 21.9*  INR 2.50* 2.10* 1.98*    Estimated Creatinine Clearance: 23.3 ml/min (by C-G formula based on Cr of 2.38).  Assessment: Patiet is a 73 y.o F on coumadin for hx Afib with plan to d/c home today.  INR down to 1.98 today with dose increased to 4mg  yesterday.  Goal of Therapy:  INR 2-3    Plan:  1) coumadin 3mg  PO x1 today and recom to cont with 2mg  daily at discharge 2) patient's INR will need to be closely monitored at discharge since she will be on cipro.  Burhan Barham P 10/07/2013,11:39 AM

## 2013-10-07 NOTE — Plan of Care (Signed)
Problem: Phase I Progression Outcomes Goal: Voiding-avoid urinary catheter unless indicated Outcome: Not Applicable Date Met:  03/49/17 Patient has chronic foley catheter.

## 2013-10-07 NOTE — Clinical Social Work Placement (Signed)
Clinical Social Work Department CLINICAL SOCIAL WORK PLACEMENT NOTE 10/07/2013  Patient:  Margaret [name], Margaret [name]  Account Number:  [id number] Admit date:  10/03/2013  Clinical Social Worker:  Cherre Blanc, [place]  Date/time:  10/04/2013 10:20 AM  Clinical Social Work is seeking post-discharge placement for this patient at the following level of care:   SKILLED NURSING   (*CSW will update this form in Epic as items are completed)   10/04/2013  Patient/family provided with [facility] Department of Clinical Social Work's list of facilities offering this level of care within the geographic area requested by the patient (or if unable, by the patient's family).  10/04/2013  Patient/family informed of their freedom to choose among providers that offer the needed level of care, that participate in Medicare, Medicaid or managed care program needed by the patient, have an available bed and are willing to accept the patient.  10/04/2013  Patient/family informed of [facility]' ownership interest in [facility], as well as of the fact that they are under no obligation to receive care at this facility.  PASARR submitted to EDS on 10/04/2013 PASARR number received on 10/04/2013  FL2 transmitted to all facilities in geographic area requested by pt/family on  10/04/2013 FL2 transmitted to all facilities within larger geographic area on   Patient informed that his/her managed care company has contracts with or will negotiate with  certain facilities, including the following:     Patient/family informed of bed offers received:  10/04/2013 Patient chooses bed at [facility] Physician recommends and patient chooses bed at    Patient to be transferred to [facility] on  10/07/2013 Patient to be transferred to facility by Ambulance Patient and family notified of transfer on 10/07/2013 Name of family member notified:  Larene Beach (daughter)  The following physician request were entered in  Epic:   Additional Comments: Per MD patient ready to DC to [facility]. RN, patient, daughter Larene Beach, and facility aware of Dc. Rn given number for report. Dc packet on chart. Ambulance transport requested fpr 2:00pm. CSW signing off.    Roddie Mc MSW, [place], [place], [phone]

## 2013-10-08 ENCOUNTER — Non-Acute Institutional Stay (SKILLED_NURSING_FACILITY): Payer: Medicare Other | Admitting: Internal Medicine

## 2013-10-08 DIAGNOSIS — N184 Chronic kidney disease, stage 4 (severe): Secondary | ICD-10-CM

## 2013-10-08 DIAGNOSIS — R7989 Other specified abnormal findings of blood chemistry: Secondary | ICD-10-CM

## 2013-10-08 DIAGNOSIS — I4891 Unspecified atrial fibrillation: Secondary | ICD-10-CM

## 2013-10-08 DIAGNOSIS — I428 Other cardiomyopathies: Secondary | ICD-10-CM

## 2013-10-08 DIAGNOSIS — R945 Abnormal results of liver function studies: Secondary | ICD-10-CM

## 2013-10-10 NOTE — Progress Notes (Signed)
HISTORY & PHYSICAL  DATE: 10/08/2013   FACILITY: [name]  LEVEL OF CARE: SNF (31)  ALLERGIES:  No Known Allergies  CHIEF COMPLAINT:  Manage cardiomyopathy, atrial fibrillation and chronic kidney disease stage IV  HISTORY OF PRESENT ILLNESS: 73 year old Caucasian female was hospitalized secondary to weakness. After hospitalization she is admitted to this facility for short-term rehabilitation.  CARDIOMYOPATHY: The patient's cardiomyopathy remains stable. Patient denies increasing lower extremity swelling, shortness of breath, chest pain, palpitations, orthopnea or PNDs. No complications reported from the medications currently being used. Complains of chronic lower extremity swelling.  ATRIAL FIBRILLATION: the patients atrial fibrillation remains stable.  The patient denies DOE, tachycardia, orthopnea, transient neurological sx, palpitations, & PNDs.  No complications noted from the medications currently being used.  CHRONIC KIDNEY DISEASE: The patient's chronic kidney disease remains stable.  Patient denies increasing lower extremity swelling or confusion. Last BUN and creatinine are: 42/2.38.  PAST MEDICAL HISTORY :  Past Medical History  Diagnosis Date  . NICM (nonischemic cardiomyopathy)     EF 35%; cath 2/12 no CAD  . Systolic CHF, chronic   . Severe mitral regurgitation     s/p complex mitral valve repair with cox maze + LAA clipping, removal of RV lead, and implantation of CRT-D in abdomen  . Cardiac arrest - ventricular fibrillation     in 1990s  . Atrial fibrillation     amiodarone  . HTN (hypertension)   . HLD (hyperlipidemia)   . Achalasia     s/p dilation  . Recurrent UTI   . Obesity   . Anoxic brain injury 1996    s/p cardiac arrest   . Automatic implantable cardioverter-defibrillator in situ   . Hypothyroidism   . TIA (transient ischemic attack)   . Chronic kidney disease (CKD), stage IV (severe)     [name] 10/03/2013     PAST SURGICAL HISTORY: Past Surgical History  Procedure Laterality Date  . Defibrillator generator explantation and reimplantation; and  08/02/2001  . Device migration with anticipated pocket revision  10/29/2003  . 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 Attempted implantation of an implantable cardioverter-  12/30/2004  . Cardioversion  04/14/2010  . Median sternotomy  07/16/2010  . Mitral valve repair  07/16/2010  . Cox maze procedure (complete biatrial lesion set with clipping of  07/16/2010  . Removal of old endocardial right ventricular defibrillator lead.  07/16/2010  . Placement of dual chamber pacemaker and implantable cardiac  07/16/2010  . Placement of swan ganz pulmonary artery catheter via left femoral access.  07/16/2010  . Laparoscopic cholecystectomy    . Cataract extraction, bilateral    . Cystoscopy w/ stone manipulation    . Cataract extraction, bilateral Bilateral     SOCIAL HISTORY:  reports that she has never smoked. She has never used smokeless tobacco. She reports that she does not drink alcohol or use illicit drugs.  FAMILY HISTORY:  Family History  Problem Relation Age of Onset  . Stroke Mother   . Lung cancer Father   . Diabetes Mellitus II Brother     CURRENT MEDICATIONS: Reviewed per MAR/see medication list  REVIEW OF SYSTEMS:  See HPI otherwise 14 point ROS is negative.  PHYSICAL EXAMINATION  VS:  See VS section  GENERAL: no acute distress, moderately obese body habitus EYES: conjunctivae normal, sclerae normal, normal eye lids MOUTH/THROAT: lips without lesions,no lesions in the mouth,tongue is  without lesions,uvula elevates in midline NECK: supple, trachea midline, no neck masses, no thyroid tenderness, no thyromegaly LYMPHATICS: no LAN in the neck, no supraclavicular LAN RESPIRATORY: breathing is even & unlabored, BS CTAB CARDIAC: Heart rate is irregularly irregular, no murmur,no extra heart  sounds, +2 bilateral lower extremity edema GI:  ABDOMEN: abdomen soft, normal BS, no masses, no tenderness  LIVER/SPLEEN: no hepatomegaly, no splenomegaly MUSCULOSKELETAL: HEAD: normal to inspection  EXTREMITIES: LEFT UPPER EXTREMITY: full range of motion, normal strength & tone RIGHT UPPER EXTREMITY:  full range of motion, normal strength & tone LEFT LOWER EXTREMITY:  full range of motion, normal strength & tone RIGHT LOWER EXTREMITY:  full range of motion, normal strength & tone PSYCHIATRIC: the patient is alert & oriented to person, affect & behavior appropriate  LABS/RADIOLOGY:  Labs reviewed: Basic Metabolic Panel:  Recent Labs  45/40/98 0445 03/31/13 1410 04/01/13 0651  04/02/13 0430 10/03/13 1648 10/04/13 0500  NA 142 137  --   < > 139 140 143  K 2.5* 3.2*  --   < > 3.1* 4.3 4.0  CL 95* 95*  --   < > 97 99 101  CO2 33* 32  --   < > 30 27 28   GLUCOSE 100* 148*  --   < > 95 108* 98  BUN 58* 60*  --   < > 57* 44* 42*  CREATININE 2.65* 2.58*  --   < > 2.31* 2.43* 2.38*  CALCIUM 8.3* 8.2*  --   < > 8.4 8.4 8.6  MG 2.6* 2.6* 2.9*  --  2.5  --   --   PHOS 4.3  --   --   --   --   --   --   < > = values in this interval not displayed. Liver Function Tests:  Recent Labs  03/31/13 0445 10/03/13 1648 10/04/13 0500  AST 37 56* 43*  ALT 21 47* 38*  ALKPHOS 76 88 77  BILITOT 0.9 0.5 0.4  PROT 6.5 7.3 6.6  ALBUMIN 3.1* 2.8* 2.7*   CBC:  Recent Labs  03/02/13 2245  03/31/13 0445 10/03/13 1648 10/04/13 0500  WBC 4.9  < > 4.1 5.3 4.6  NEUTROABS 3.0  --   --  3.3  --   HGB 11.0*  < > 9.4* 10.6* 9.4*  HCT 34.4*  < > 30.2* 34.6* 30.9*  MCV 98.6  < > 98.7 97.7 96.6  PLT 228  < > 195 256 238  < > = values in this interval not displayed.  Cardiac Enzymes:  Recent Labs  03/31/13 0448 03/31/13 1410 10/03/13 2143  CKTOTAL  --   --  95  TROPONINI <0.30 <0.30 <0.30    Noninvasive Vascular Lab  Left Lower Extremity Venous Duplex Evaluation  Patient:     Meral, Geissinger MR #:       [id number] Study Date: 08/30/2013 Gender:     F Age:        84 Height: Weight: BSA: Pt. Status: Room:    ATTENDING    Cathie Olden  REFERRING    Terald Sleeper  REFERRING    Allean Found  SONOGRAPHER  Toma Deiters, RVS Reports also to:  ------------------------------------------------------------ History and indications:  Indications  729.5 Pain in limb.  729.81 Swelling of limb.  History  Diagnostic evaluation.  Left lower extremity pain.  Swelling of the left lower extremity.  Erythema of  the left lower extremity.  ------------------------------------------------------------ Study information:  Study status:  Routine.  Procedure:  A vascular evaluation was performed with the patient in the supine position. The left common femoral, left femoral, left greater saphenous, left profunda femoral, left popliteal, left peroneal, and left posterior tibial veins were studied. Image quality was adequate. The study was technically limited due to body habitus.    Left lower extremity venous duplex evaluation.   Doppler flow study including B-mode compression maneuvers of all visualized segments, color flow Doppler and selected views of pulsed wave Doppler.  Location:  Vascular laboratory.  Patient status:  Outpatient.  Venous flow:  +--------------------------+-------+----------------------+ Location                  OverallFlow properties        +--------------------------+-------+----------------------+ Left common femoral       Patent Phasic; spontaneous;                                    compressible           +--------------------------+-------+----------------------+ Left femoral              Patent Compressible           +--------------------------+-------+----------------------+ Left profunda femoral     Patent Compressible            +--------------------------+-------+----------------------+ Left popliteal            Patent Phasic; spontaneous;                                    compressible           +--------------------------+-------+----------------------+ Left posterior tibial     Patent Compressible           +--------------------------+-------+----------------------+ Left peroneal             Patent Compressible           +--------------------------+-------+----------------------+ Left saphenofemoral       Patent Compressible           junction                                                +--------------------------+-------+----------------------+ Left greater saphenous    Patent Compressible           +--------------------------+-------+----------------------+  ------------------------------------------------------------ Summary:  - Technically difficult due to body habitus - No evidence of deep vein or superficial thrombosis   involving the left lower extremity and right common   femoral vein. - No evidence of Baker's cyst on the left. Other specific details can be found in the table(s) above.    Prepared and Electronically Authenticated by  CHEST  2 VIEW   COMPARISON:  03/30/2013   FINDINGS: A defibrillator is again identified as are postsurgical changes. The cardiac shadow is stable. The lungs are well aerated bilaterally without focal infiltrate or sizable effusion. No acute bony abnormality is seen.   IMPRESSION: No acute abnormality noted.   ASSESSMENT/PLAN:  Cardiomyopathy-compensated Atrial fibrillation-rate controlled Chronic kidney disease stage IV-recheck Elevated liver functions--recheck Hypothyroidism -- continue levothyroxine Renovascular hypertension-blood pressure elevated. Will monitor. Allergic rhinitis-well-controlled Check CMP  I have reviewed patient's medical records received at admission/from hospitalization.  CPT CODE:  16109  Angela Cox, MD [facility] [phone]

## 2013-10-23 ENCOUNTER — Encounter: Payer: Self-pay | Admitting: Adult Health

## 2013-10-23 ENCOUNTER — Encounter: Payer: Self-pay | Admitting: Neurology

## 2013-10-23 ENCOUNTER — Non-Acute Institutional Stay (SKILLED_NURSING_FACILITY): Payer: Medicare Other | Admitting: Adult Health

## 2013-10-23 DIAGNOSIS — I5022 Chronic systolic (congestive) heart failure: Secondary | ICD-10-CM

## 2013-10-23 DIAGNOSIS — N184 Chronic kidney disease, stage 4 (severe): Secondary | ICD-10-CM

## 2013-10-23 DIAGNOSIS — I150 Renovascular hypertension: Secondary | ICD-10-CM

## 2013-10-23 DIAGNOSIS — E039 Hypothyroidism, unspecified: Secondary | ICD-10-CM | POA: Insufficient documentation

## 2013-10-23 DIAGNOSIS — J309 Allergic rhinitis, unspecified: Secondary | ICD-10-CM

## 2013-10-23 DIAGNOSIS — I4891 Unspecified atrial fibrillation: Secondary | ICD-10-CM

## 2013-10-23 DIAGNOSIS — Z7901 Long term (current) use of anticoagulants: Secondary | ICD-10-CM

## 2013-10-23 NOTE — Progress Notes (Signed)
Patient ID: Margaret SineCarolyn S [name], female   DOB: 07/27/1940, 73 y.o.   MRN: [id number]              PROGRESS NOTE  DATE: 10/23/2013   FACILITY: [name]  LEVEL OF CARE: SNF (31)  Acute Visit  CHIEF COMPLAINT:  Discharge Notes  HISTORY OF PRESENT ILLNESS: This is a 73 year old female who is for discharge home with Home health PT and OT. [facility]. Patient was admitted to this facility for short-term rehabilitation after the patient's recent hospitalization.  Patient has completed SNF rehabilitation and therapy has cleared the patient for discharge.  Reassessment of ongoing problem(s):  ATRIAL FIBRILLATION: the patients atrial fibrillation remains stable.  The patient denies DOE, tachycardia, orthopnea, transient neurological sx, pedal edema, palpitations, & PNDs.  No complications noted from the medications currently being used.  CHF:The patient does not relate significant weight changes, denies sob, DOE, orthopnea, PNDs, pedal edema, palpitations or chest pain.  CHF remains stable.  No complications form the medications being used.  ALLERGIC RHINITIS: Allergic rhinitis remains stable.  Patient denies ongoing symptoms such as runny nose sneezing or tearing. No complications reported from the current medication(s) being used.  PAST MEDICAL HISTORY : Reviewed.  No changes/see problem list  CURRENT MEDICATIONS: Reviewed per MAR/see medication list  REVIEW OF SYSTEMS:  GENERAL: no change in appetite, no fatigue, no weight changes, no fever, chills or weakness RESPIRATORY: no cough, SOB, DOE, wheezing, hemoptysis CARDIAC: no chest pain, edema or palpitations, +automated ICD in abdomen GI: no abdominal pain, diarrhea, constipation, heart burn, nausea or vomiting  PHYSICAL EXAMINATION  GENERAL: no acute distress, obese EYES: conjunctivae normal, sclerae normal, normal eye lids NECK: supple,  trachea midline, no neck masses, no thyroid tenderness, no thyromegaly LYMPHATICS: no LAN in the neck, no supraclavicular LAN RESPIRATORY: breathing is even & unlabored, BS CTAB CARDIAC: irregularly irregular, no murmur,no extra heart sounds, BLE edema 2+ GI: abdomen soft, normal BS, no masses, no tenderness, no hepatomegaly, no splenomegaly GU:  Has foley catheter intact, draining clear yellow urine EXTREMITIES:  Able to move all 4 extremities PSYCHIATRIC: the patient is alert & oriented to person, affect & behavior appropriate  LABS/RADIOLOGY: 10/17/13  WBC 4.4 hemoglobin 8.7 hematocrit 29.5 sodium 140 potassium 4.0 glucose 88 BUN 30 creatinine 1.8 calcium 8.4 10/09/13  sodium 137 potassium 4.5 glucose 94 BUN 40 creatinine 2.0 calcium 8.3 total protein 6.2 on Coumadin 2.8 glob 3.4 AST 25 ALP 71 ALT 23  Labs reviewed: Basic Metabolic Panel:  Recent Labs  78/29/5610/11/06 0445 03/31/13 1410 04/01/13 0651  04/02/13 0430 10/03/13 1648 10/04/13 0500  NA 142 137  --   < > 139 140 143  K 2.5* 3.2*  --   < > 3.1* 4.3 4.0  CL 95* 95*  --   < > 97 99 101  CO2 33* 32  --   < > 30 27 28   GLUCOSE 100* 148*  --   < > 95 108* 98  BUN 58* 60*  --   < > 57* 44* 42*  CREATININE 2.65* 2.58*  --   < > 2.31* 2.43* 2.38*  CALCIUM 8.3* 8.2*  --   < > 8.4 8.4 8.6  MG 2.6* 2.6* 2.9*  --  2.5  --   --   PHOS 4.3  --   --   --   --   --   --   < > =  values in this interval not displayed.  Liver Function Tests:  Recent Labs  03/31/13 0445 10/03/13 1648 10/04/13 0500  AST 37 56* 43*  ALT 21 47* 38*  ALKPHOS 76 88 77  BILITOT 0.9 0.5 0.4  PROT 6.5 7.3 6.6  ALBUMIN 3.1* 2.8* 2.7*   CBC:  Recent Labs  03/02/13 2245  03/31/13 0445 10/03/13 1648 10/04/13 0500  WBC 4.9  < > 4.1 5.3 4.6  NEUTROABS 3.0  --   --  3.3  --   HGB 11.0*  < > 9.4* 10.6* 9.4*  HCT 34.4*  < > 30.2* 34.6* 30.9*  MCV 98.6  < > 98.7 97.7 96.6  PLT 228  < > 195 256 238  < > = values in this interval not  displayed.   Cardiac Enzymes:  Recent Labs  03/31/13 0448 03/31/13 1410 10/03/13 2143  CKTOTAL  --   --  95  TROPONINI <0.30 <0.30 <0.30   CLINICAL DATA:  Weakness   EXAM: CHEST  2 VIEW   COMPARISON:  03/30/2013   FINDINGS: A defibrillator is again identified as are postsurgical changes. The cardiac shadow is stable. The lungs are well aerated bilaterally without focal infiltrate or sizable effusion. No acute bony abnormality is seen.   IMPRESSION: No acute abnormality noted.    ASSESSMENT/PLAN:  Weakness - for Home health PT and OT Atrial fibrillation - rate controlled; continue metoprolol and Coumadin Long-term use of anticoagulants - INR 2.3 - therapeutic; continue Coumadin 2.5 mg PO Q D and check INR 10/29/13 Chronic kidney disease, stage IV - check BMP Chronic systolic CHF - stable; continue Lasix and Zaroxolyn when necessary Renovascular hypertension - well controlled; continue metoprolol Hypothyroidism - continue Synthroid Allergic rhinitis - stable; continue loratadine and Flonase   I have filled out patient's discharge paperwork and written prescriptions.  Patient will receive home health PT and OT.   Total discharge time: Less than 30 minutes  Discharge time involved coordination of the discharge process with Child psychotherapist, nursing staff and therapy department. Medical justification for home health services verified.  CPT CODE: 96283  Ella Bodo - NP [facility] [phone]

## 2013-10-28 ENCOUNTER — Telehealth: Payer: Self-pay | Admitting: Neurology

## 2013-10-28 NOTE — Telephone Encounter (Signed)
Called to check on Dat Scan. Patient was in the hospital and discharged to rehab. Patient's daughter cancelled Dat scan and stated she would call back to r/s when her mom felt better.

## 2013-10-29 DIAGNOSIS — IMO0001 Reserved for inherently not codable concepts without codable children: Secondary | ICD-10-CM

## 2013-10-29 DIAGNOSIS — R5383 Other fatigue: Secondary | ICD-10-CM

## 2013-10-29 DIAGNOSIS — I509 Heart failure, unspecified: Secondary | ICD-10-CM

## 2013-10-29 DIAGNOSIS — I5022 Chronic systolic (congestive) heart failure: Secondary | ICD-10-CM

## 2013-10-29 DIAGNOSIS — R5381 Other malaise: Secondary | ICD-10-CM

## 2013-10-31 ENCOUNTER — Encounter (HOSPITAL_BASED_OUTPATIENT_CLINIC_OR_DEPARTMENT_OTHER): Payer: Medicare Other | Attending: Internal Medicine

## 2013-10-31 DIAGNOSIS — L97909 Non-pressure chronic ulcer of unspecified part of unspecified lower leg with unspecified severity: Principal | ICD-10-CM

## 2013-10-31 DIAGNOSIS — L8993 Pressure ulcer of unspecified site, stage 3: Secondary | ICD-10-CM | POA: Insufficient documentation

## 2013-10-31 DIAGNOSIS — L97809 Non-pressure chronic ulcer of other part of unspecified lower leg with unspecified severity: Secondary | ICD-10-CM | POA: Insufficient documentation

## 2013-10-31 DIAGNOSIS — L89899 Pressure ulcer of other site, unspecified stage: Secondary | ICD-10-CM | POA: Diagnosis not present

## 2013-10-31 DIAGNOSIS — I87339 Chronic venous hypertension (idiopathic) with ulcer and inflammation of unspecified lower extremity: Secondary | ICD-10-CM | POA: Diagnosis present

## 2013-11-04 DIAGNOSIS — L97909 Non-pressure chronic ulcer of unspecified part of unspecified lower leg with unspecified severity: Secondary | ICD-10-CM | POA: Diagnosis not present

## 2013-11-04 DIAGNOSIS — I87339 Chronic venous hypertension (idiopathic) with ulcer and inflammation of unspecified lower extremity: Secondary | ICD-10-CM | POA: Diagnosis not present

## 2013-11-07 ENCOUNTER — Other Ambulatory Visit: Payer: Self-pay | Admitting: Internal Medicine

## 2013-11-07 DIAGNOSIS — L8993 Pressure ulcer of unspecified site, stage 3: Secondary | ICD-10-CM | POA: Diagnosis not present

## 2013-11-07 DIAGNOSIS — L97809 Non-pressure chronic ulcer of other part of unspecified lower leg with unspecified severity: Secondary | ICD-10-CM | POA: Diagnosis not present

## 2013-11-07 DIAGNOSIS — L89899 Pressure ulcer of other site, unspecified stage: Secondary | ICD-10-CM | POA: Diagnosis not present

## 2013-11-07 DIAGNOSIS — I87339 Chronic venous hypertension (idiopathic) with ulcer and inflammation of unspecified lower extremity: Secondary | ICD-10-CM | POA: Diagnosis not present

## 2013-11-14 DIAGNOSIS — L8993 Pressure ulcer of unspecified site, stage 3: Secondary | ICD-10-CM | POA: Diagnosis not present

## 2013-11-14 DIAGNOSIS — L97909 Non-pressure chronic ulcer of unspecified part of unspecified lower leg with unspecified severity: Secondary | ICD-10-CM | POA: Diagnosis not present

## 2013-11-14 DIAGNOSIS — L97809 Non-pressure chronic ulcer of other part of unspecified lower leg with unspecified severity: Secondary | ICD-10-CM | POA: Diagnosis not present

## 2013-11-14 DIAGNOSIS — L89899 Pressure ulcer of other site, unspecified stage: Secondary | ICD-10-CM | POA: Diagnosis not present

## 2013-11-14 DIAGNOSIS — I87339 Chronic venous hypertension (idiopathic) with ulcer and inflammation of unspecified lower extremity: Secondary | ICD-10-CM | POA: Diagnosis not present

## 2013-11-18 DIAGNOSIS — I87339 Chronic venous hypertension (idiopathic) with ulcer and inflammation of unspecified lower extremity: Secondary | ICD-10-CM | POA: Diagnosis not present

## 2013-11-18 DIAGNOSIS — L97809 Non-pressure chronic ulcer of other part of unspecified lower leg with unspecified severity: Secondary | ICD-10-CM | POA: Diagnosis not present

## 2013-11-18 DIAGNOSIS — L8993 Pressure ulcer of unspecified site, stage 3: Secondary | ICD-10-CM | POA: Diagnosis not present

## 2013-11-18 DIAGNOSIS — L97909 Non-pressure chronic ulcer of unspecified part of unspecified lower leg with unspecified severity: Secondary | ICD-10-CM | POA: Diagnosis not present

## 2013-11-18 DIAGNOSIS — L89899 Pressure ulcer of other site, unspecified stage: Secondary | ICD-10-CM | POA: Diagnosis not present

## 2013-11-19 NOTE — Progress Notes (Signed)
[facility]  NAME:  Margaret [name], Margaret [name]               ACCOUNT NO.:  [id number]  MEDICAL RECORD NO.:  [id number]      DATE OF BIRTH:  March 27, 1941  PHYSICIAN:  Glenna Fellows, MD    VISIT DATE:  11/18/2013                                  OFFICE VISIT   CHIEF COMPLAINT:  Follow up of right lower extremity wound.  HISTORY OF PRESENT ILLNESS:  The patient is a 73 year old ambulatory female that has been under the care of Dr. Leanord Hawking for right lower extremity wound in the setting of venous stasis.  The patient presents today for concern of recurrent bleeding over her right lower extremity. She is accompanied by her husband, who states that the dressing needed to be change over the weekend secondary to drainage that soaked through the outer dressing.  She was last seen approximately 5 days ago by Dr. Leanord Hawking.  The patient is on chronic anticoagulation for heart valve.  She cannot report any recent protime for monitoring of her Coumadin.  On examination, she has both the posterior and medial wounds.  The larger of the two is reported to be secondary to previous dressing and wrapped that came down and caused pressure ulceration.  Blood pressure is 142/58, pulse is 75, temperature is 98, height is 5 feet and 4 inches, weight is 201 pounds.  She is positive on all sensory points by Semmes-Weinstein test.  ABI is reported as 1.11.  Right posterior calf wound is measured at 0.4 x 0.3 x 0.1.  There is surrounding areas of excoriation with no definite wounding.  Over the right medial leg, there is open wound measured at 2.6 x 5.7 x 0.1 cm.  The wound base appears to be old blood clots.  This was removed today with both saline, curette and gauze. Base of the wound appears to be subcutaneous fat, that is not completely granulated.  Counseled the patient and her husband that she is subject to recurrent bouts of bleeding due to her anticoagulation.  Presently, it appears that there is  no active bleeding and we will continue with her current wound care.  She can defer her appoint with Dr. Leanord Hawking this week and will follow up in 1 week's time, and I encouraged them to check her protime levels.  They stated that they will call their home health agency or their primary care physician for obtaining this this week.          ______________________________ Glenna Fellows, MD     BT/MEDQ  D:  11/19/2013  T:  11/19/2013  Job:  [id number]

## 2013-11-27 ENCOUNTER — Inpatient Hospital Stay (HOSPITAL_COMMUNITY)
Admission: EM | Admit: 2013-11-27 | Discharge: 2013-12-02 | DRG: 872 | Disposition: A | Discharge status: Home Health | Payer: Medicare Other | Attending: Internal Medicine | Admitting: Internal Medicine

## 2013-11-27 ENCOUNTER — Inpatient Hospital Stay (HOSPITAL_COMMUNITY): Payer: Medicare Other

## 2013-11-27 ENCOUNTER — Encounter (HOSPITAL_COMMUNITY): Payer: Self-pay | Admitting: Emergency Medicine

## 2013-11-27 DIAGNOSIS — D649 Anemia, unspecified: Secondary | ICD-10-CM | POA: Diagnosis present

## 2013-11-27 DIAGNOSIS — Z79899 Other long term (current) drug therapy: Secondary | ICD-10-CM

## 2013-11-27 DIAGNOSIS — Z5189 Encounter for other specified aftercare: Secondary | ICD-10-CM

## 2013-11-27 DIAGNOSIS — I129 Hypertensive chronic kidney disease with stage 1 through stage 4 chronic kidney disease, or unspecified chronic kidney disease: Secondary | ICD-10-CM | POA: Diagnosis present

## 2013-11-27 DIAGNOSIS — N189 Chronic kidney disease, unspecified: Secondary | ICD-10-CM

## 2013-11-27 DIAGNOSIS — A419 Sepsis, unspecified organism: Secondary | ICD-10-CM | POA: Diagnosis present

## 2013-11-27 DIAGNOSIS — Z87440 Personal history of urinary (tract) infections: Secondary | ICD-10-CM

## 2013-11-27 DIAGNOSIS — I4891 Unspecified atrial fibrillation: Secondary | ICD-10-CM | POA: Diagnosis present

## 2013-11-27 DIAGNOSIS — E039 Hypothyroidism, unspecified: Secondary | ICD-10-CM | POA: Diagnosis present

## 2013-11-27 DIAGNOSIS — R197 Diarrhea, unspecified: Secondary | ICD-10-CM | POA: Diagnosis present

## 2013-11-27 DIAGNOSIS — E860 Dehydration: Secondary | ICD-10-CM | POA: Diagnosis present

## 2013-11-27 DIAGNOSIS — Y846 Urinary catheterization as the cause of abnormal reaction of the patient, or of later complication, without mention of misadventure at the time of the procedure: Secondary | ICD-10-CM | POA: Diagnosis present

## 2013-11-27 DIAGNOSIS — Z7901 Long term (current) use of anticoagulants: Secondary | ICD-10-CM

## 2013-11-27 DIAGNOSIS — N390 Urinary tract infection, site not specified: Secondary | ICD-10-CM | POA: Diagnosis present

## 2013-11-27 DIAGNOSIS — Z6838 Body mass index (BMI) 38.0-38.9, adult: Secondary | ICD-10-CM | POA: Diagnosis not present

## 2013-11-27 DIAGNOSIS — E785 Hyperlipidemia, unspecified: Secondary | ICD-10-CM | POA: Diagnosis present

## 2013-11-27 DIAGNOSIS — I509 Heart failure, unspecified: Secondary | ICD-10-CM | POA: Diagnosis present

## 2013-11-27 DIAGNOSIS — T83091A Other mechanical complication of indwelling urethral catheter, initial encounter: Secondary | ICD-10-CM | POA: Diagnosis present

## 2013-11-27 DIAGNOSIS — R5383 Other fatigue: Secondary | ICD-10-CM | POA: Diagnosis present

## 2013-11-27 DIAGNOSIS — I2789 Other specified pulmonary heart diseases: Secondary | ICD-10-CM | POA: Diagnosis present

## 2013-11-27 DIAGNOSIS — R8290 Unspecified abnormal findings in urine: Secondary | ICD-10-CM | POA: Diagnosis present

## 2013-11-27 DIAGNOSIS — R5381 Other malaise: Secondary | ICD-10-CM | POA: Diagnosis present

## 2013-11-27 DIAGNOSIS — R7881 Bacteremia: Secondary | ICD-10-CM | POA: Diagnosis present

## 2013-11-27 DIAGNOSIS — N289 Disorder of kidney and ureter, unspecified: Secondary | ICD-10-CM

## 2013-11-27 DIAGNOSIS — E669 Obesity, unspecified: Secondary | ICD-10-CM | POA: Diagnosis present

## 2013-11-27 DIAGNOSIS — E876 Hypokalemia: Secondary | ICD-10-CM | POA: Diagnosis present

## 2013-11-27 DIAGNOSIS — S81001S Unspecified open wound, right knee, sequela: Secondary | ICD-10-CM

## 2013-11-27 DIAGNOSIS — S91009A Unspecified open wound, unspecified ankle, initial encounter: Secondary | ICD-10-CM

## 2013-11-27 DIAGNOSIS — I5022 Chronic systolic (congestive) heart failure: Secondary | ICD-10-CM | POA: Diagnosis present

## 2013-11-27 DIAGNOSIS — S81801D Unspecified open wound, right lower leg, subsequent encounter: Secondary | ICD-10-CM

## 2013-11-27 DIAGNOSIS — Z8673 Personal history of transient ischemic attack (TIA), and cerebral infarction without residual deficits: Secondary | ICD-10-CM

## 2013-11-27 DIAGNOSIS — W19XXXA Unspecified fall, initial encounter: Secondary | ICD-10-CM | POA: Diagnosis present

## 2013-11-27 DIAGNOSIS — N179 Acute kidney failure, unspecified: Secondary | ICD-10-CM | POA: Diagnosis present

## 2013-11-27 DIAGNOSIS — N183 Chronic kidney disease, stage 3 unspecified: Secondary | ICD-10-CM | POA: Diagnosis present

## 2013-11-27 DIAGNOSIS — S81009A Unspecified open wound, unspecified knee, initial encounter: Secondary | ICD-10-CM

## 2013-11-27 DIAGNOSIS — Z9849 Cataract extraction status, unspecified eye: Secondary | ICD-10-CM

## 2013-11-27 DIAGNOSIS — Z66 Do not resuscitate: Secondary | ICD-10-CM | POA: Diagnosis present

## 2013-11-27 DIAGNOSIS — N184 Chronic kidney disease, stage 4 (severe): Secondary | ICD-10-CM | POA: Diagnosis present

## 2013-11-27 DIAGNOSIS — R339 Retention of urine, unspecified: Secondary | ICD-10-CM | POA: Diagnosis present

## 2013-11-27 DIAGNOSIS — R55 Syncope and collapse: Secondary | ICD-10-CM | POA: Diagnosis present

## 2013-11-27 DIAGNOSIS — I428 Other cardiomyopathies: Secondary | ICD-10-CM | POA: Diagnosis present

## 2013-11-27 DIAGNOSIS — S81809A Unspecified open wound, unspecified lower leg, initial encounter: Secondary | ICD-10-CM

## 2013-11-27 DIAGNOSIS — E872 Acidosis, unspecified: Secondary | ICD-10-CM | POA: Diagnosis present

## 2013-11-27 DIAGNOSIS — S91001S Unspecified open wound, right ankle, sequela: Secondary | ICD-10-CM

## 2013-11-27 DIAGNOSIS — Z95810 Presence of automatic (implantable) cardiac defibrillator: Secondary | ICD-10-CM

## 2013-11-27 DIAGNOSIS — A4159 Other Gram-negative sepsis: Secondary | ICD-10-CM | POA: Diagnosis present

## 2013-11-27 DIAGNOSIS — S81801S Unspecified open wound, right lower leg, sequela: Secondary | ICD-10-CM

## 2013-11-27 DIAGNOSIS — Z8674 Personal history of sudden cardiac arrest: Secondary | ICD-10-CM | POA: Diagnosis not present

## 2013-11-27 DIAGNOSIS — S81802D Unspecified open wound, left lower leg, subsequent encounter: Secondary | ICD-10-CM

## 2013-11-27 DIAGNOSIS — T839XXA Unspecified complication of genitourinary prosthetic device, implant and graft, initial encounter: Secondary | ICD-10-CM

## 2013-11-27 DIAGNOSIS — L03115 Cellulitis of right lower limb: Secondary | ICD-10-CM

## 2013-11-27 LAB — COMPREHENSIVE METABOLIC PANEL
ALK PHOS: 103 U/L (ref 39–117)
ALT: 11 U/L (ref 0–35)
AST: 21 U/L (ref 0–37)
Albumin: 3.4 g/dL — ABNORMAL LOW (ref 3.5–5.2)
Anion gap: 20 — ABNORMAL HIGH (ref 5–15)
BUN: 51 mg/dL — ABNORMAL HIGH (ref 6–23)
CO2: 21 mEq/L (ref 19–32)
Calcium: 9.5 mg/dL (ref 8.4–10.5)
Chloride: 101 mEq/L (ref 96–112)
Creatinine, Ser: 3.63 mg/dL — ABNORMAL HIGH (ref 0.50–1.10)
GFR calc Af Amer: 13 mL/min — ABNORMAL LOW (ref >90)
GFR calc non Af Amer: 11 mL/min — ABNORMAL LOW (ref >90)
GLUCOSE: 132 mg/dL — AB (ref 70–99)
POTASSIUM: 5.3 meq/L (ref 3.7–5.3)
SODIUM: 142 meq/L (ref 137–147)
TOTAL PROTEIN: 8.7 g/dL — AB (ref 6.0–8.3)
Total Bilirubin: 1.5 mg/dL — ABNORMAL HIGH (ref 0.3–1.2)

## 2013-11-27 LAB — URINALYSIS, ROUTINE W REFLEX MICROSCOPIC
Bilirubin Urine: NEGATIVE
Glucose, UA: NEGATIVE mg/dL
Ketones, ur: NEGATIVE mg/dL
Nitrite: NEGATIVE
Protein, ur: 30 mg/dL — AB
SPECIFIC GRAVITY, URINE: 1.009 (ref 1.005–1.030)
Urobilinogen, UA: 0.2 mg/dL (ref 0.0–1.0)
pH: 8 (ref 5.0–8.0)

## 2013-11-27 LAB — PROTIME-INR
INR: 3.7 — ABNORMAL HIGH (ref 0.00–1.49)
Prothrombin Time: 36.7 seconds — ABNORMAL HIGH (ref 11.6–15.2)

## 2013-11-27 LAB — URINE MICROSCOPIC-ADD ON

## 2013-11-27 LAB — CBC WITH DIFFERENTIAL/PLATELET
Basophils Absolute: 0 10*3/uL (ref 0.0–0.1)
Basophils Relative: 0 % (ref 0–1)
EOS ABS: 0.1 10*3/uL (ref 0.0–0.7)
Eosinophils Relative: 1 % (ref 0–5)
HCT: 37.1 % (ref 36.0–46.0)
Hemoglobin: 11.1 g/dL — ABNORMAL LOW (ref 12.0–15.0)
LYMPHS ABS: 0.7 10*3/uL (ref 0.7–4.0)
Lymphocytes Relative: 6 % — ABNORMAL LOW (ref 12–46)
MCH: 29.6 pg (ref 26.0–34.0)
MCHC: 29.9 g/dL — ABNORMAL LOW (ref 30.0–36.0)
MCV: 98.9 fL (ref 78.0–100.0)
Monocytes Absolute: 0.6 10*3/uL (ref 0.1–1.0)
Monocytes Relative: 5 % (ref 3–12)
NEUTROS ABS: 10.9 10*3/uL — AB (ref 1.7–7.7)
NEUTROS PCT: 88 % — AB (ref 43–77)
PLATELETS: 260 10*3/uL (ref 150–400)
RBC: 3.75 MIL/uL — AB (ref 3.87–5.11)
RDW: 16.4 % — ABNORMAL HIGH (ref 11.5–15.5)
WBC: 12.3 10*3/uL — AB (ref 4.0–10.5)

## 2013-11-27 LAB — TROPONIN I: Troponin I: <0.3 ng/mL (ref <0.30)

## 2013-11-27 LAB — MRSA PCR SCREENING: MRSA by PCR: NEGATIVE

## 2013-11-27 LAB — I-STAT CG4 LACTIC ACID, ED: LACTIC ACID, VENOUS: 1.59 mmol/L (ref 0.5–2.2)

## 2013-11-27 MED ORDER — LEVOTHYROXINE SODIUM 50 MCG PO TABS
50.0000 ug | ORAL_TABLET | Freq: Every day | ORAL | Status: DC
  Administered 2013-11-28: 50 ug via ORAL
  Filled 2013-11-27 (×2): qty 1

## 2013-11-27 MED ORDER — OXYCODONE HCL 5 MG PO TABS
5.0000 mg | ORAL_TABLET | ORAL | Status: DC | PRN
  Filled 2013-11-27 (×2): qty 1

## 2013-11-27 MED ORDER — ONDANSETRON HCL 4 MG PO TABS: 4.0000 mg | ORAL_TABLET | Freq: Four times a day (QID) | ORAL | Status: DC | PRN

## 2013-11-27 MED ORDER — ACETAMINOPHEN 325 MG PO TABS: 650.0000 mg | ORAL_TABLET | Freq: Four times a day (QID) | ORAL | Status: DC | PRN

## 2013-11-27 MED ORDER — WARFARIN SODIUM 1 MG PO TABS
1.0000 mg | ORAL_TABLET | Freq: Once | ORAL | Status: AC
  Administered 2013-11-27: 1 mg via ORAL
  Filled 2013-11-27: qty 1

## 2013-11-27 MED ORDER — SODIUM CHLORIDE 0.9 % IV BOLUS (SEPSIS)
250.0000 mL | Freq: Once | INTRAVENOUS | Status: AC
  Administered 2013-11-27: 250 mL via INTRAVENOUS

## 2013-11-27 MED ORDER — WARFARIN SODIUM 1 MG PO TABS: 1.0000 mg | ORAL_TABLET | Freq: Once | ORAL | Status: DC

## 2013-11-27 MED ORDER — ONDANSETRON HCL 4 MG/2ML IJ SOLN: 4.0000 mg | Freq: Four times a day (QID) | INTRAMUSCULAR | Status: DC | PRN

## 2013-11-27 MED ORDER — SODIUM CHLORIDE 0.9 % IJ SOLN
3.0000 mL | Freq: Two times a day (BID) | INTRAMUSCULAR | Status: DC
  Administered 2013-11-30 – 2013-12-02 (×5): 3 mL via INTRAVENOUS

## 2013-11-27 MED ORDER — SODIUM CHLORIDE 0.9 % IV SOLN
INTRAVENOUS | Status: DC
  Administered 2013-11-27 – 2013-11-28 (×3): via INTRAVENOUS
  Administered 2013-11-28: 125 mL/h via INTRAVENOUS

## 2013-11-27 MED ORDER — FLUTICASONE PROPIONATE 50 MCG/ACT NA SUSP
1.0000 | Freq: Every day | NASAL | Status: DC
  Administered 2013-11-27 – 2013-12-01 (×5): 1 via NASAL
  Filled 2013-11-27 (×3): qty 16

## 2013-11-27 MED ORDER — VANCOMYCIN HCL IN DEXTROSE 1-5 GM/200ML-% IV SOLN: 1000.0000 mg | INTRAVENOUS | Status: DC

## 2013-11-27 MED ORDER — PIPERACILLIN-TAZOBACTAM IN DEX 2-0.25 GM/50ML IV SOLN
2.2500 g | Freq: Three times a day (TID) | INTRAVENOUS | Status: DC
  Administered 2013-11-27: 2.25 g via INTRAVENOUS
  Filled 2013-11-27 (×3): qty 50

## 2013-11-27 MED ORDER — VANCOMYCIN HCL 10 G IV SOLR
1500.0000 mg | INTRAVENOUS | Status: AC
  Administered 2013-11-27: 1500 mg via INTRAVENOUS
  Filled 2013-11-27: qty 1500

## 2013-11-27 MED ORDER — WARFARIN - PHARMACIST DOSING INPATIENT: Freq: Every day | Status: DC

## 2013-11-27 MED ORDER — ACETAMINOPHEN 650 MG RE SUPP: 650.0000 mg | Freq: Four times a day (QID) | RECTAL | Status: DC | PRN

## 2013-11-27 NOTE — ED Notes (Signed)
Placed pt on enteric precautions until Cdiff is ruled out

## 2013-11-27 NOTE — ED Provider Notes (Signed)
CSN: [id number]     Arrival date & time 11/27/13  8099 History   First MD Initiated Contact with Patient 11/27/13 1017     Chief Complaint  Patient presents with  . Loss of Consciousness     (Consider location/radiation/quality/duration/timing/severity/associated sxs/prior Treatment) Patient is a 73 y.o. female presenting with syncope and diarrhea.  Loss of Consciousness Episode history:  Single Most recent episode:  Today Duration: brief. Timing:  Constant Progression:  Resolved Chronicity:  New Context comment:  Diarrhea for 3 days.  Her son and daughter were lifting her up from bed to get her changed at the time of syncope. Witnessed: yes   Relieved by:  Lying down Associated symptoms: nausea, vomiting and weakness   Associated symptoms: no chest pain, no difficulty breathing, no fever and no seizures   Diarrhea Quality:  Semi-solid Severity:  Severe Onset quality:  Gradual Number of episodes:  5-6 Duration:  3 days Progression:  Unchanged Relieved by:  Nothing Worsened by:  Nothing tried Associated symptoms: vomiting   Associated symptoms: no abdominal pain and no fever   Associated symptoms comment:  No recent antibiotics   Past Medical History  Diagnosis Date  . NICM (nonischemic cardiomyopathy)     EF 35%; cath 2/12 no CAD  . Systolic CHF, chronic   . Severe mitral regurgitation     s/p complex mitral valve repair with cox maze + LAA clipping, removal of RV lead, and implantation of CRT-D in abdomen  . Cardiac arrest - ventricular fibrillation     in 1990s  . Atrial fibrillation     amiodarone  . HTN (hypertension)   . HLD (hyperlipidemia)   . Achalasia     s/p dilation  . Recurrent UTI   . Obesity   . Anoxic brain injury 1996    s/p cardiac arrest   . Automatic implantable cardioverter-defibrillator in situ   . Hypothyroidism   . TIA (transient ischemic attack)   . Chronic kidney disease (CKD), stage IV (severe)     [name] 10/03/2013   Past Surgical  History  Procedure Laterality Date  . Defibrillator generator explantation and reimplantation; and  08/02/2001  . Device migration with anticipated pocket revision  10/29/2003  . 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 Attempted implantation of an implantable cardioverter-  12/30/2004  . Cardioversion  04/14/2010  . Median sternotomy  07/16/2010  . Mitral valve repair  07/16/2010  . Cox maze procedure (complete biatrial lesion set with clipping of  07/16/2010  . Removal of old endocardial right ventricular defibrillator lead.  07/16/2010  . Placement of dual chamber pacemaker and implantable cardiac  07/16/2010  . Placement of swan ganz pulmonary artery catheter via left femoral access.  07/16/2010  . Laparoscopic cholecystectomy    . Cataract extraction, bilateral    . Cystoscopy w/ stone manipulation    . Cataract extraction, bilateral Bilateral    Family History  Problem Relation Age of Onset  . Stroke Mother   . Lung cancer Father   . Diabetes Mellitus II Brother    History  Substance Use Topics  . Smoking status: Never Smoker   . Smokeless tobacco: Never Used  . Alcohol Use: No   OB History   Grav Para Term Preterm Abortions TAB SAB Ect Mult Living                 Review of Systems  Constitutional: Negative for fever.  Cardiovascular: Positive  for syncope. Negative for chest pain.  Gastrointestinal: Positive for nausea, vomiting and diarrhea. Negative for abdominal pain.  Neurological: Positive for weakness. Negative for seizures.  All other systems reviewed and are negative.     Allergies  Review of patient's allergies indicates no known allergies.  Home Medications   Prior to Admission medications   Medication Sig Start Date End Date Taking? Authorizing Provider  desloratadine (CLARINEX) 5 MG tablet Take 5 mg by mouth every morning.    Yes Historical Provider, MD  fluticasone (FLONASE) 50 MCG/ACT nasal spray Place 1  spray into both nostrils at bedtime.  06/26/10  Yes Historical Provider, MD  folic acid (FOLVITE) 1 MG tablet Take 1 tablet by mouth daily. 08/31/10  Yes Historical Provider, MD  furosemide (LASIX) 80 MG tablet Take 80 mg by mouth 2 (two) times daily.   Yes Historical Provider, MD  levothyroxine (SYNTHROID, LEVOTHROID) 50 MCG tablet Take 50 mcg by mouth daily before breakfast.   Yes Historical Provider, MD  metolazone (ZAROXOLYN) 2.5 MG tablet Take 2.5 mg by mouth daily as needed (swelling).    Yes Historical Provider, MD  metoprolol tartrate (LOPRESSOR) 25 MG tablet Take 12.5 mg by mouth 2 (two) times daily.  08/31/10  Yes Historical Provider, MD  potassium chloride SA (K-DUR,KLOR-CON) 20 MEQ tablet Take 80 mEq by mouth 2 (two) times daily.   Yes Historical Provider, MD  warfarin (COUMADIN) 1 MG tablet Take 2 mg by mouth at bedtime.   Yes Historical Provider, MD   BP 124/49  Pulse 82  Temp(Src) 97.6 F (36.4 C) (Oral)  Resp 18  Ht 5\' 3"  (1.6 m)  Wt 215 lb (97.523 kg)  BMI 38.09 kg/m2  SpO2 99% Physical Exam  Nursing note and vitals reviewed. Constitutional: She is oriented to person, place, and time. She appears well-developed and well-nourished. No distress.  HENT:  Head: Normocephalic and atraumatic.  Mouth/Throat: Oropharynx is clear and moist.  Eyes: Conjunctivae are normal. Pupils are equal, round, and reactive to light. No scleral icterus.  Neck: Neck supple.  Cardiovascular: Normal rate, regular rhythm, normal heart sounds and intact distal pulses.   No murmur heard. Pulmonary/Chest: Effort normal and breath sounds normal. No stridor. No respiratory distress. She has no rales.  Abdominal: Soft. Bowel sounds are normal. She exhibits mass (Suprapubic mass consistent with distended bladder). She exhibits no distension. There is tenderness in the suprapubic area.  Musculoskeletal: Normal range of motion.  Neurological: She is alert and oriented to person, place, and time.  Skin: Skin is  warm and dry. No rash noted.  Psychiatric: She has a normal mood and affect. Her behavior is normal.    ED Course  Procedures (including critical care time) Labs Review Labs Reviewed  CBC WITH DIFFERENTIAL - Abnormal; Notable for the following:    WBC 12.3 (*)    RBC 3.75 (*)    Hemoglobin 11.1 (*)    MCHC 29.9 (*)    RDW 16.4 (*)    Neutrophils Relative % 88 (*)    Neutro Abs 10.9 (*)    Lymphocytes Relative 6 (*)    All other components within normal limits  COMPREHENSIVE METABOLIC PANEL - Abnormal; Notable for the following:    Glucose, Bld 132 (*)    BUN 51 (*)    Creatinine, Ser 3.63 (*)    Total Protein 8.7 (*)    Albumin 3.4 (*)    Total Bilirubin 1.5 (*)    GFR calc non Af [place]  11 (*)    GFR calc Af Amer 13 (*)    Anion gap 20 (*)    All other components within normal limits  URINALYSIS, ROUTINE W REFLEX MICROSCOPIC - Abnormal; Notable for the following:    APPearance CLOUDY (*)    Hgb urine dipstick MODERATE (*)    Protein, ur 30 (*)    Leukocytes, UA LARGE (*)    All other components within normal limits  URINE MICROSCOPIC-ADD ON - Abnormal; Notable for the following:    Bacteria, UA MANY (*)    All other components within normal limits  CLOSTRIDIUM DIFFICILE BY PCR  CULTURE, BLOOD (ROUTINE X 2)  CULTURE, BLOOD (ROUTINE X 2)  TROPONIN I  I-STAT CG4 LACTIC ACID, ED  POC OCCULT BLOOD, ED    Imaging Review Dg Tibia/fibula Right  11/27/2013   CLINICAL DATA:  Pain.  EXAM: RIGHT TIBIA AND FIBULA - 2 VIEW  COMPARISON:  None.  FINDINGS: a soft tissue wound noted over the right medial posterior calf. No radiopaque foreign bodies. Vascular calcification noted consistent peripheral vascular disease. No acute bony abnormality identified. No evidence of fracture or dislocation. No lytic lesion.  IMPRESSION: 1. Soft tissue wound. 2. Peripheral vascular disease. 3. No acute bony abnormality.   Electronically Signed   By: Maisie Fus  Register   On: 11/27/2013 16:28  All  radiology studies independently viewed by me.      EKG Interpretation None     EKG - paced rhythm, rate 89, compared to prior, now paced  MDM   Final diagnoses:  Syncope, unspecified syncope type  Complication, blocked Foley catheter, initial encounter  Acute renal insufficiency  Diarrhea  Dehydration  Leg wound, right, subsequent encounter    73 yo female presenting after syncope.  In the setting of three days of diarrhea.  She was lifted out of bed to be cleaned up after diarrhea when she passed out in her son's arms.  She regained consciousness after being laid back down.  This probably represents orthostatic syncope in setting of volume depletion from diarrhea.    Her creatinine is worse than her baseline, given small bolus of fluids (hx of CHF).    She also had a malfunctioning foley catheter which was replaced with resolution of her distended bladder.    She also has a RLE wound which has been draining a large amount of dark colored discharge and has very foul odor.  No crepitus.  Doubt necrotizing infection by exam, but do suspect odor represents wound infection.  Xray without emphysema.  Dr. Carolynne Edouard has agreed to come evaluate wound.    Dr. Rito Ehrlich will admit.     Candyce Churn III, MD 11/27/13 [phone]

## 2013-11-27 NOTE — ED Notes (Signed)
Redressed pt's wound on right leg. Two open sores- foul smelling with exudate on wound. Cleansed and applied nonadherent gauze and kerlix

## 2013-11-27 NOTE — Progress Notes (Signed)
ANTIBIOTIC CONSULT NOTE - INITIAL  Pharmacy Consult for Vancomycin and Zosyn Indication: R leg infection  No Known Allergies  Patient Measurements: Height: 5\' 3"  (160 cm) Weight: 215 lb (97.523 kg) IBW/kg (Calculated) : 52.4 Adjusted Body Weight: 73 kg  Vital Signs: Temp: 97.6 F (36.4 C) (08/05 1005) Temp src: Oral (08/05 1005) BP: 108/43 mmHg (08/05 1445) Pulse Rate: 83 (08/05 1445) Intake/Output from previous day:   Intake/Output from this shift:    Labs:  Recent Labs  11/27/13 1108  WBC 12.3*  HGB 11.1*  PLT 260  CREATININE 3.63*   Estimated Creatinine Clearance: 15.3 ml/min (by C-G formula based on Cr of 3.63). No results found for this basename: VANCOTROUGH, VANCOPEAK, VANCORANDOM, GENTTROUGH, GENTPEAK, GENTRANDOM, TOBRATROUGH, TOBRAPEAK, TOBRARND, AMIKACINPEAK, AMIKACINTROU, AMIKACIN,  in the last 72 hours   Microbiology: No results found for this or any previous visit (from the past 720 hour(s)).  Medical History: Past Medical History  Diagnosis Date  . NICM (nonischemic cardiomyopathy)     EF 35%; cath 2/12 no CAD  . Systolic CHF, chronic   . Severe mitral regurgitation     s/p complex mitral valve repair with cox maze + LAA clipping, removal of RV lead, and implantation of CRT-D in abdomen  . Cardiac arrest - ventricular fibrillation     in 1990s  . Atrial fibrillation     amiodarone  . HTN (hypertension)   . HLD (hyperlipidemia)   . Achalasia     s/p dilation  . Recurrent UTI   . Obesity   . Anoxic brain injury 1996    s/p cardiac arrest   . Automatic implantable cardioverter-defibrillator in situ   . Hypothyroidism   . TIA (transient ischemic attack)   . Chronic kidney disease (CKD), stage IV (severe)     Margaret [name] 10/03/2013    Medications:  See electronic med rec  Assessment: 73 y.o. female presents with syncopal episode, diarrhea x 3 days. Noted pt with two open sores on RLE - foul smelling with exudate on wound. Noted pt recently  discharged from hospital 10/07/13. Pt with CKD (stage IV). SCr up to 3.63 (baseline ~2.4). To begin broad spectrum (Vanc and Zosyn) for wound infection.  Goal of Therapy:  Vancomycin trough level 10-15 mcg/ml  Plan:  1. Vancomycin 1.5gm IV now then 1gm IV q48h. 2. Zosyn 2.25gm IV q8h. 3. Will f/u renal function, pt's clinical condition 4. Vanc trough prn  Christoper Fabian, PharmD, BCPS Clinical pharmacist, pager [phone] 11/27/2013,3:00 PM

## 2013-11-27 NOTE — ED Notes (Signed)
MD ordered to wait for stool sample to collect occult blood.

## 2013-11-27 NOTE — H&P (Signed)
History and Physical  Margaret [name] ZOX:[id number] DOB: 06/25/1940 DOA: 11/27/2013  Referring physician: Blake Divine, ER physician PCP: Allean Found, MD   Chief Complaint: Diarrhea  HPI: Margaret [name] is a 73 y.o. female  Past medical history of chronic systolic heart failure, a chronic Foley catheter, a chronic left lower extremity wound and stage IV chronic kidney disease who for the last 2-3 days has had continuous diarrhea.   Patient had been so weak, but she could not get out of bed. In trying to today, she fell on the floor and could not get up. When her son helped her sit upright, she became lightheaded and the next any issues in emergency room. She had been told that she passed out.   In emergency room, patient was noted to have mild leukocytosis of 12.3 with an 83% shift. C. difficile cultures were sent. Patient's urinary catheter was noted to not be draining. Following placement in the emergency room with a new Foley catheter, patient started having more free-flowing urine. Lab work also noted a creatinine of 3.63 with a BUN of 51-baseline was 2.54. Patient had an anion gap of 20 lactic acid level of only 1.59. X-rays of her lower extremity wound were unremarkable. Surgery evaluated her leg no evidence of concern for abscess or necrotizing fasciitis. Hospitalists were called for evaluation.   Review of Systems:  Patient seen after arrival to floor. Currently feeling better. Denies any pain. Complains of feeling weak and lightheaded. No nausea vomiting. Denies any headaches, vision changes, dysphasia, chest pain, shortness of breath, wheeze or cough, abdominal pain, constipation or lower extremity numbness. Her review of systems is otherwise negative.   Past Medical History  Diagnosis Date  . NICM (nonischemic cardiomyopathy)     EF 35%; cath 2/12 no CAD  . Systolic CHF, chronic   . Severe mitral regurgitation     s/p complex mitral valve repair with cox maze + LAA  clipping, removal of RV lead, and implantation of CRT-D in abdomen  . Cardiac arrest - ventricular fibrillation     in 1990s  . Atrial fibrillation     amiodarone  . HTN (hypertension)   . HLD (hyperlipidemia)   . Achalasia     s/p dilation  . Recurrent UTI   . Obesity   . Anoxic brain injury 1996    s/p cardiac arrest   . Automatic implantable cardioverter-defibrillator in situ   . Hypothyroidism   . TIA (transient ischemic attack)   . Chronic kidney disease (CKD), stage IV (severe)     [name] 10/03/2013   Past Surgical History  Procedure Laterality Date  . Defibrillator generator explantation and reimplantation; and  08/02/2001  . Device migration with anticipated pocket revision  10/29/2003  . 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 Attempted implantation of an implantable cardioverter-  12/30/2004  . Cardioversion  04/14/2010  . Median sternotomy  07/16/2010  . Mitral valve repair  07/16/2010  . Cox maze procedure (complete biatrial lesion set with clipping of  07/16/2010  . Removal of old endocardial right ventricular defibrillator lead.  07/16/2010  . Placement of dual chamber pacemaker and implantable cardiac  07/16/2010  . Placement of swan ganz pulmonary artery catheter via left femoral access.  07/16/2010  . Laparoscopic cholecystectomy    . Cataract extraction, bilateral    . Cystoscopy w/ stone manipulation    . Cataract extraction, bilateral Bilateral    Social History:  reports that she has never smoked. She has never used smokeless tobacco. She reports that she does not drink alcohol or use illicit drugs. Patient lives at home with her husband & is able to participate in activities of daily living withuse of a wheelchair. She has previously used a walker in her limited activity and has been working with physical therapy to try to use more of this.   No Known Allergies  Family History  Problem Relation Age of Onset  .  Stroke Mother   . Lung cancer Father   . Diabetes Mellitus II Brother     As discussed with the patient   Prior to Admission medications   Medication Sig Start Date End Date Taking? Authorizing Provider  desloratadine (CLARINEX) 5 MG tablet Take 5 mg by mouth every morning.    Yes Historical Provider, MD  fluticasone (FLONASE) 50 MCG/ACT nasal spray Place 1 spray into both nostrils at bedtime.  06/26/10  Yes Historical Provider, MD  folic acid (FOLVITE) 1 MG tablet Take 1 tablet by mouth daily. 08/31/10  Yes Historical Provider, MD  furosemide (LASIX) 80 MG tablet Take 80 mg by mouth 2 (two) times daily.   Yes Historical Provider, MD  levothyroxine (SYNTHROID, LEVOTHROID) 50 MCG tablet Take 50 mcg by mouth daily before breakfast.   Yes Historical Provider, MD  metolazone (ZAROXOLYN) 2.5 MG tablet Take 2.5 mg by mouth daily as needed (swelling).    Yes Historical Provider, MD  metoprolol tartrate (LOPRESSOR) 25 MG tablet Take 12.5 mg by mouth 2 (two) times daily.  08/31/10  Yes Historical Provider, MD  potassium chloride SA (K-DUR,KLOR-CON) 20 MEQ tablet Take 80 mEq by mouth 2 (two) times daily.   Yes Historical Provider, MD  warfarin (COUMADIN) 1 MG tablet Take 2 mg by mouth at bedtime.   Yes Historical Provider, MD    Physical Exam: BP 101/40  Pulse 79  Temp(Src) 97.6 F (36.4 C) (Oral)  Resp 14  Ht 5\' 5"  (1.651 m)  Wt 91.899 kg (202 lb 9.6 oz)  BMI 33.71 kg/m2  SpO2 96%  General:  Alert and oriented x3, no acute distress  Eyes: Extraocular movements are intact, sclera nonicteric  ENT: Normocephalic, atraumatic, extremities are dry  Neck: No JVD  Cardiovascular: Paced rhythm  Respiratory: Clear to auscultation bilaterally  Abdomen: Soft, obese, nontender, positive bowel sounds  Skin: Right lower extremity has approximately 2 cm stage III decubitus wound but no active drainage  Musculoskeletal: No clubbing or cyanosis cyanosis, trace edema, chronic venous stasis  Psychiatric: Patient  is appropriate, no evidence of psychoses  Neurologic: No focal deficits          Labs on Admission:  Basic Metabolic Panel:  Recent Labs Lab 11/27/13 1108  NA 142  K 5.3  CL 101  CO2 21  GLUCOSE 132*  BUN 51*  CREATININE 3.63*  CALCIUM 9.5   Liver Function Tests:  Recent Labs Lab 11/27/13 1108  AST 21  ALT 11  ALKPHOS 103  BILITOT 1.5*  PROT 8.7*  ALBUMIN 3.4*   No results found for this basename: LIPASE, AMYLASE,  in the last 168 hours No results found for this basename: AMMONIA,  in the last 168 hours CBC:  Recent Labs Lab 11/27/13 1108  WBC 12.3*  NEUTROABS 10.9*  HGB 11.1*  HCT 37.1  MCV 98.9  PLT 260   Cardiac Enzymes:  Recent Labs Lab 11/27/13 1108  TROPONINI <0.30    BNP (last 3 results)  Recent  Labs  03/02/13 2245 03/30/13 1815  PROBNP 1436.0* 1353.0*   CBG: No results found for this basename: GLUCAP,  in the last 168 hours  Radiological Exams on Admission: Dg Tibia/fibula Right  11/27/2013   CLINICAL DATA:  Pain.  EXAM: RIGHT TIBIA AND FIBULA - 2 VIEW  COMPARISON:  None.  FINDINGS: a soft tissue wound noted over the right medial posterior calf. No radiopaque foreign bodies. Vascular calcification noted consistent peripheral vascular disease. No acute bony abnormality identified. No evidence of fracture or dislocation. No lytic lesion.  IMPRESSION: 1. Soft tissue wound. 2. Peripheral vascular disease. 3. No acute bony abnormality.   Electronically Signed   By: Maisie Fus  Register   On: 11/27/2013 16:28    EKG: Independently reviewed. Paced rhythm   Assessment/Plan Present on Admission:  . Hyperlipidemia: Stable.  . Obesity: Patient is criteria with BMI greater than 30.  Marland Kitchen Chronic systolic heart failure: We'll carefully hydrate. No signs of overt heart failure at this time.  . Atrial fibrillation: Status post pacemaker. Continue anticoagulation per pharmacy   . Hypothyroidism: Stable. Continue Synthroid.  . Diarrhea: Cause  unclear. May not be C. difficile. Await PCR cultures. Would hold off on treating antibiotics initially. Enteric precautions.  . Metabolic acidosis: Elevated anion gap. Likely secondary to acute uremia. No signs of sepsis. Recheck labs in the morning.  . Acute renal failure in the setting of . Chronic kidney disease (CKD), stage IV (severe): Prerenal azotemia. Treat with gentle IV fluids. Recheck labs in the morning.   chronic Foley catheter: Mild urinary retention resolved with new Foley. Would not treat as urinary tract infection.  Lower extremity wound: Wound care to see. No signs of emergent sepsis or severe infection. Received one dose of vancomycin and Zosyn the emergency room. I'm reluctant to start antibiotics until we have confirmed new infection. Await wound care evaluation and C. difficile cultures.  Consultants: General surgery   Code Status: DO NOT RESUSCITATE as confirmed by patient   Family Communication: Spoke with son in the waiting room   Disposition Plan: Likely transfer out a step down unit tomorrow. Discharge home once wound on leg is assessed, causes diarrhea is determined and renal function back to baseline   Time spent: 45 minutes   Hollice Espy [facility] Pager [phone]  **Disclaimer: This note may have been dictated with voice recognition software. Similar sounding words can inadvertently be transcribed and this note may contain transcription errors which may not have been corrected upon publication of note.**

## 2013-11-27 NOTE — Consult Note (Signed)
Reason for Consult:wound right calf Referring Physician: Dr. Deeann Saint Margaret [name] is an 73 y.o. female.  HPI: The pt is a 73 yo wf who is brought to the hospital for diarrhea. She is noted to have an open wound on her right calf which has been there for 3-4 months. She denies any pain. She is followed at the wound center. No fever or chills. No abdominal pain  Past Medical History  Diagnosis Date  . NICM (nonischemic cardiomyopathy)     EF 35%; cath 2/12 no CAD  . Systolic CHF, chronic   . Severe mitral regurgitation     s/p complex mitral valve repair with cox maze + LAA clipping, removal of RV lead, and implantation of CRT-D in abdomen  . Cardiac arrest - ventricular fibrillation     in 1990s  . Atrial fibrillation     amiodarone  . HTN (hypertension)   . HLD (hyperlipidemia)   . Achalasia     s/p dilation  . Recurrent UTI   . Obesity   . Anoxic brain injury 1996    s/p cardiac arrest   . Automatic implantable cardioverter-defibrillator in situ   . Hypothyroidism   . TIA (transient ischemic attack)   . Chronic kidney disease (CKD), stage IV (severe)     Archie Endo 10/03/2013    Past Surgical History  Procedure Laterality Date  . Defibrillator generator explantation and reimplantation; and  08/02/2001  . Device migration with anticipated pocket revision  10/29/2003  . 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 Attempted implantation of an implantable cardioverter-  12/30/2004  . Cardioversion  04/14/2010  . Median sternotomy  07/16/2010  . Mitral valve repair  07/16/2010  . Cox maze procedure (complete biatrial lesion set with clipping of  07/16/2010  . Removal of old endocardial right ventricular defibrillator lead.  07/16/2010  . Placement of dual chamber pacemaker and implantable cardiac  07/16/2010  . Placement of swan ganz pulmonary artery catheter via left femoral access.  07/16/2010  . Laparoscopic cholecystectomy    . Cataract  extraction, bilateral    . Cystoscopy w/ stone manipulation    . Cataract extraction, bilateral Bilateral     Family History  Problem Relation Age of Onset  . Stroke Mother   . Lung cancer Father   . Diabetes Mellitus II Brother     Social History:  reports that she has never smoked. She has never used smokeless tobacco. She reports that she does not drink alcohol or use illicit drugs.  Allergies: No Known Allergies  Medications: I have reviewed the patient's current medications.  Results for orders placed during the hospital encounter of 11/27/13 (from the past 48 hour(s))  CBC WITH DIFFERENTIAL     Status: Abnormal   Collection Time    11/27/13 11:08 AM      Result Value Ref Range   WBC 12.3 (*) 4.0 - 10.5 K/uL   RBC 3.75 (*) 3.87 - 5.11 MIL/uL   Hemoglobin 11.1 (*) 12.0 - 15.0 g/dL   HCT 37.1  36.0 - 46.0 %   MCV 98.9  78.0 - 100.0 fL   MCH 29.6  26.0 - 34.0 pg   MCHC 29.9 (*) 30.0 - 36.0 g/dL   RDW 16.4 (*) 11.5 - 15.5 %   Platelets 260  150 - 400 K/uL   Neutrophils Relative % 88 (*) 43 - 77 %   Neutro Abs 10.9 (*) 1.7 - 7.7  K/uL   Lymphocytes Relative 6 (*) 12 - 46 %   Lymphs Abs 0.7  0.7 - 4.0 K/uL   Monocytes Relative 5  3 - 12 %   Monocytes Absolute 0.6  0.1 - 1.0 K/uL   Eosinophils Relative 1  0 - 5 %   Eosinophils Absolute 0.1  0.0 - 0.7 K/uL   Basophils Relative 0  0 - 1 %   Basophils Absolute 0.0  0.0 - 0.1 K/uL  COMPREHENSIVE METABOLIC PANEL     Status: Abnormal   Collection Time    11/27/13 11:08 AM      Result Value Ref Range   Sodium 142  137 - 147 mEq/L   Potassium 5.3  3.7 - 5.3 mEq/L   Chloride 101  96 - 112 mEq/L   CO2 21  19 - 32 mEq/L   Glucose, Bld 132 (*) 70 - 99 mg/dL   BUN 51 (*) 6 - 23 mg/dL   Creatinine, Ser 3.63 (*) 0.50 - 1.10 mg/dL   Calcium 9.5  8.4 - 10.5 mg/dL   Total Protein 8.7 (*) 6.0 - 8.3 g/dL   Albumin 3.4 (*) 3.5 - 5.2 g/dL   AST 21  0 - 37 U/L   ALT 11  0 - 35 U/L   Alkaline Phosphatase 103  39 - 117 U/L   Total  Bilirubin 1.5 (*) 0.3 - 1.2 mg/dL   GFR calc non Af Amer 11 (*) >90 mL/min   GFR calc Af Amer 13 (*) >90 mL/min   Comment: (NOTE)     The eGFR has been calculated using the CKD EPI equation.     This calculation has not been validated in all clinical situations.     eGFR's persistently <90 mL/min signify possible Chronic Kidney     Disease.   Anion gap 20 (*) 5 - 15  TROPONIN I     Status: None   Collection Time    11/27/13 11:08 AM      Result Value Ref Range   Troponin I <0.30  <0.30 ng/mL   Comment:            Due to the release kinetics of cTnI,     a negative result within the first hours     of the onset of symptoms does not rule out     myocardial infarction with certainty.     If myocardial infarction is still suspected,     repeat the test at appropriate intervals.  URINALYSIS, ROUTINE W REFLEX MICROSCOPIC     Status: Abnormal   Collection Time    11/27/13 11:43 AM      Result Value Ref Range   Color, Urine YELLOW  YELLOW   APPearance CLOUDY (*) CLEAR   Specific Gravity, Urine 1.009  1.005 - 1.030   pH 8.0  5.0 - 8.0   Glucose, UA NEGATIVE  NEGATIVE mg/dL   Hgb urine dipstick MODERATE (*) NEGATIVE   Bilirubin Urine NEGATIVE  NEGATIVE   Ketones, ur NEGATIVE  NEGATIVE mg/dL   Protein, ur 30 (*) NEGATIVE mg/dL   Urobilinogen, UA 0.2  0.0 - 1.0 mg/dL   Nitrite NEGATIVE  NEGATIVE   Leukocytes, UA LARGE (*) NEGATIVE  URINE MICROSCOPIC-ADD ON     Status: Abnormal   Collection Time    11/27/13 11:43 AM      Result Value Ref Range   Squamous Epithelial / LPF RARE  RARE   WBC, UA 21-50  <3 WBC/hpf  RBC / HPF 0-2  <3 RBC/hpf   Bacteria, UA MANY (*) RARE  I-STAT CG4 LACTIC ACID, ED     Status: None   Collection Time    11/27/13  4:09 PM      Result Value Ref Range   Lactic Acid, Venous 1.59  0.5 - 2.2 mmol/L    Dg Tibia/fibula Right  11/27/2013   CLINICAL DATA:  Pain.  EXAM: RIGHT TIBIA AND FIBULA - 2 VIEW  COMPARISON:  None.  FINDINGS: a soft tissue wound noted over  the right medial posterior calf. No radiopaque foreign bodies. Vascular calcification noted consistent peripheral vascular disease. No acute bony abnormality identified. No evidence of fracture or dislocation. No lytic lesion.  IMPRESSION: 1. Soft tissue wound. 2. Peripheral vascular disease. 3. No acute bony abnormality.   Electronically Signed   By: Marcello Moores  Register   On: 11/27/2013 16:28    Review of Systems  Constitutional: Negative.   HENT: Negative.   Eyes: Negative.   Respiratory: Negative.   Cardiovascular: Negative.   Gastrointestinal: Positive for diarrhea. Negative for abdominal pain.  Genitourinary: Negative.   Musculoskeletal: Negative.   Skin: Negative.   Neurological: Negative.   Endo/Heme/Allergies: Negative.   Psychiatric/Behavioral: Negative.    Blood pressure 109/41, pulse 83, temperature 97.6 F (36.4 C), temperature source Oral, resp. rate 24, height $RemoveBe'5\' 3"'OfSWnnAus$  (1.6 m), weight 215 lb (97.523 kg), SpO2 94.00%. Physical Exam  Constitutional: She is oriented to person, place, and time. She appears well-developed and well-nourished.  HENT:  Head: Normocephalic and atraumatic.  Eyes: Conjunctivae and EOM are normal. Pupils are equal, round, and reactive to light.  Neck: Normal range of motion. Neck supple.  Cardiovascular: Normal rate, regular rhythm and normal heart sounds.   Respiratory: Effort normal and breath sounds normal.  GI: Soft. Bowel sounds are normal. There is no tenderness.  Musculoskeletal: Normal range of motion.  Open flat wound of right calf area. Very clean. Minimal drainage. No real cellulitis  Neurological: She is alert and oriented to person, place, and time.  Skin: Skin is warm and dry.  Psychiatric: She has a normal mood and affect. Her behavior is normal.    Assessment/Plan: The pt has a chronic wound of right calf. It is stable and has been present for 3-4 months. It looks clean. I would continue wet to dry dressing changes for now and consider  consulting the wound care service since they are following her anyway.  TOTH III,PAUL S 11/27/2013, 5:28 PM

## 2013-11-27 NOTE — Progress Notes (Signed)
ANTICOAGULATION CONSULT NOTE - Initial Consult  Pharmacy Consult for Warfarin Indication: atrial fibrillation  No Known Allergies  Patient Measurements: Height: 5\' 5"  (165.1 cm) Weight: 202 lb 9.6 oz (91.899 kg) IBW/kg (Calculated) : 57 Heparin Dosing Weight:   Vital Signs: Temp: 97.6 F (36.4 C) (08/05 1005) Temp src: Oral (08/05 1005) BP: 97/30 mmHg (08/05 1900) Pulse Rate: 73 (08/05 1900)  Labs:  Recent Labs  11/27/13 1108  HGB 11.1*  HCT 37.1  PLT 260  CREATININE 3.63*  TROPONINI <0.30    Estimated Creatinine Clearance: 15.5 ml/min (by C-G formula based on Cr of 3.63).   Medical History: Past Medical History  Diagnosis Date  . NICM (nonischemic cardiomyopathy)     EF 35%; cath 2/12 no CAD  . Systolic CHF, chronic   . Severe mitral regurgitation     s/p complex mitral valve repair with cox maze + LAA clipping, removal of RV lead, and implantation of CRT-D in abdomen  . Cardiac arrest - ventricular fibrillation     in 1990s  . Atrial fibrillation     amiodarone  . HTN (hypertension)   . HLD (hyperlipidemia)   . Achalasia     s/p dilation  . Recurrent UTI   . Obesity   . Anoxic brain injury 1996    s/p cardiac arrest   . Automatic implantable cardioverter-defibrillator in situ   . Hypothyroidism   . TIA (transient ischemic attack)   . Chronic kidney disease (CKD), stage IV (severe)     [name] 10/03/2013    Medications:  Prescriptions prior to admission  Medication Sig Dispense Refill  . desloratadine (CLARINEX) 5 MG tablet Take 5 mg by mouth every morning.       . fluticasone (FLONASE) 50 MCG/ACT nasal spray Place 1 spray into both nostrils at bedtime.       . folic acid (FOLVITE) 1 MG tablet Take 1 tablet by mouth daily.      . furosemide (LASIX) 80 MG tablet Take 80 mg by mouth 2 (two) times daily.      Marland Kitchen levothyroxine (SYNTHROID, LEVOTHROID) 50 MCG tablet Take 50 mcg by mouth daily before breakfast.      . metolazone (ZAROXOLYN) 2.5 MG tablet  Take 2.5 mg by mouth daily as needed (swelling).       . metoprolol tartrate (LOPRESSOR) 25 MG tablet Take 12.5 mg by mouth 2 (two) times daily.       . potassium chloride SA (K-DUR,KLOR-CON) 20 MEQ tablet Take 80 mEq by mouth 2 (two) times daily.      Marland Kitchen warfarin (COUMADIN) 1 MG tablet Take 2 mg by mouth at bedtime.        Assessment: Pt admitted for R calf wound and multiple days of diarrhea.  On warfarin PTA for AFib.  PTA dose: warfarin 2 mg po daily. Admit INR is supratherapeutic at 3.7.  Historically INRs have been within appropriate range.   Goal of Therapy:  INR 2-3 Monitor platelets by anticoagulation protocol: Yes    Plan:  Warfarin 1 mg po x1 Daily INR Monitor closely for s/sx bleeding   Agapito Games, PharmD, BCPS Clinical Pharmacist Pager: [phone] 11/27/2013 8:21 PM

## 2013-11-27 NOTE — ED Notes (Signed)
Assisted Margaret Abed, RN with changing of indwelling foley cath; family stepped out to waiting area; also changed the chuks underneath pt as well as placing on a clean dry diaper; pt resting at this time

## 2013-11-27 NOTE — ED Notes (Signed)
Pt to department via EMS- pt reports that her daughter was trying to get her up out of bed this morning and had a syncopal episode. Pt reports that she has been feeling weak for the past couple of days. CBG-214 Bp-138/88 Hr-90 paced. 18g left AC. Pt reports that she sees a wound care place for her right leg.

## 2013-11-27 NOTE — ED Notes (Signed)
Pt undressed, in gown, on monitor, continuous pulse oximetry and blood pressure cuff; myself and Lillia Abed, RN cleaned pt and changed her stretcher linens, placed multiple chuks underneath pt and diaper; pt resting at this time

## 2013-11-27 NOTE — Progress Notes (Signed)
Pt admitted with chronic foley. Catheter was replaced in the ED per report. Urine is yellow, cloudy, and malodorous. Urine sample sent with change in catheter.

## 2013-11-28 ENCOUNTER — Encounter (HOSPITAL_BASED_OUTPATIENT_CLINIC_OR_DEPARTMENT_OTHER): Payer: Medicare Other | Attending: Internal Medicine

## 2013-11-28 DIAGNOSIS — N179 Acute kidney failure, unspecified: Secondary | ICD-10-CM

## 2013-11-28 DIAGNOSIS — N189 Chronic kidney disease, unspecified: Secondary | ICD-10-CM

## 2013-11-28 DIAGNOSIS — I509 Heart failure, unspecified: Secondary | ICD-10-CM

## 2013-11-28 DIAGNOSIS — R8290 Unspecified abnormal findings in urine: Secondary | ICD-10-CM | POA: Diagnosis present

## 2013-11-28 DIAGNOSIS — A419 Sepsis, unspecified organism: Secondary | ICD-10-CM | POA: Diagnosis present

## 2013-11-28 DIAGNOSIS — L97909 Non-pressure chronic ulcer of unspecified part of unspecified lower leg with unspecified severity: Principal | ICD-10-CM

## 2013-11-28 DIAGNOSIS — I5022 Chronic systolic (congestive) heart failure: Secondary | ICD-10-CM

## 2013-11-28 DIAGNOSIS — IMO0002 Reserved for concepts with insufficient information to code with codable children: Secondary | ICD-10-CM

## 2013-11-28 DIAGNOSIS — Y33XXXS Other specified events, undetermined intent, sequela: Secondary | ICD-10-CM

## 2013-11-28 DIAGNOSIS — R7881 Bacteremia: Secondary | ICD-10-CM

## 2013-11-28 DIAGNOSIS — E039 Hypothyroidism, unspecified: Secondary | ICD-10-CM

## 2013-11-28 DIAGNOSIS — L97809 Non-pressure chronic ulcer of other part of unspecified lower leg with unspecified severity: Secondary | ICD-10-CM | POA: Insufficient documentation

## 2013-11-28 DIAGNOSIS — E860 Dehydration: Secondary | ICD-10-CM

## 2013-11-28 DIAGNOSIS — I4891 Unspecified atrial fibrillation: Secondary | ICD-10-CM

## 2013-11-28 DIAGNOSIS — I87339 Chronic venous hypertension (idiopathic) with ulcer and inflammation of unspecified lower extremity: Secondary | ICD-10-CM | POA: Insufficient documentation

## 2013-11-28 DIAGNOSIS — N390 Urinary tract infection, site not specified: Secondary | ICD-10-CM

## 2013-11-28 LAB — CBC
HCT: 31.2 % — ABNORMAL LOW (ref 36.0–46.0)
Hemoglobin: 9.4 g/dL — ABNORMAL LOW (ref 12.0–15.0)
MCH: 30 pg (ref 26.0–34.0)
MCHC: 30.1 g/dL (ref 30.0–36.0)
MCV: 99.7 fL (ref 78.0–100.0)
PLATELETS: 175 10*3/uL (ref 150–400)
RBC: 3.13 MIL/uL — ABNORMAL LOW (ref 3.87–5.11)
RDW: 16.5 % — ABNORMAL HIGH (ref 11.5–15.5)
WBC: 6.4 10*3/uL (ref 4.0–10.5)

## 2013-11-28 LAB — BASIC METABOLIC PANEL
Anion gap: 17 — ABNORMAL HIGH (ref 5–15)
BUN: 53 mg/dL — ABNORMAL HIGH (ref 6–23)
CO2: 20 mEq/L (ref 19–32)
Calcium: 8.1 mg/dL — ABNORMAL LOW (ref 8.4–10.5)
Chloride: 106 mEq/L (ref 96–112)
Creatinine, Ser: 3.16 mg/dL — ABNORMAL HIGH (ref 0.50–1.10)
GFR calc Af Amer: 16 mL/min — ABNORMAL LOW (ref >90)
GFR calc non Af Amer: 14 mL/min — ABNORMAL LOW (ref >90)
Glucose, Bld: 95 mg/dL (ref 70–99)
Potassium: 3.8 mEq/L (ref 3.7–5.3)
Sodium: 143 mEq/L (ref 137–147)

## 2013-11-28 LAB — PROTIME-INR
INR: 3.95 — ABNORMAL HIGH (ref 0.00–1.49)
PROTHROMBIN TIME: 38.6 s — AB (ref 11.6–15.2)

## 2013-11-28 LAB — T4, FREE: Free T4: 0.96 ng/dL (ref 0.80–1.80)

## 2013-11-28 LAB — TSH: TSH: 41.36 u[IU]/mL — ABNORMAL HIGH (ref 0.350–4.500)

## 2013-11-28 MED ORDER — LEVOFLOXACIN IN D5W 250 MG/50ML IV SOLN
250.0000 mg | INTRAVENOUS | Status: DC
  Administered 2013-11-28 – 2013-11-30 (×2): 250 mg via INTRAVENOUS
  Filled 2013-11-28 (×2): qty 50

## 2013-11-28 MED ORDER — LEVOFLOXACIN IN D5W 500 MG/100ML IV SOLN: 500.0000 mg | INTRAVENOUS | Status: DC

## 2013-11-28 MED ORDER — LEVOTHYROXINE SODIUM 125 MCG PO TABS
125.0000 ug | ORAL_TABLET | Freq: Every day | ORAL | Status: DC
  Administered 2013-11-29: 125 ug via ORAL
  Filled 2013-11-28 (×2): qty 1

## 2013-11-28 MED ORDER — LEVOTHYROXINE SODIUM 100 MCG IV SOLR
50.0000 ug | Freq: Once | INTRAVENOUS | Status: AC
  Administered 2013-11-28: 50 ug via INTRAVENOUS
  Filled 2013-11-28: qty 5

## 2013-11-28 MED ORDER — SODIUM CHLORIDE 0.9 % IV BOLUS (SEPSIS)
500.0000 mL | Freq: Once | INTRAVENOUS | Status: AC
  Administered 2013-11-28: 500 mL via INTRAVENOUS

## 2013-11-28 NOTE — Progress Notes (Addendum)
[facility]  Margaret [name] RUE:[id number] DOB: 06-15-1940 DOA: 11/27/2013 PCP: Allean Found, MD   Brief narrative: 73 y.o.WF PMHx chronic systolic heart failure (pacer implanted and abdomen), hypothyroidism, chronic Foley catheter, chronic left lower extremity wound and stage IV chronic kidney disease. For the last 2-3 days she has had continuous diarrhea. Patient reported extreme weakness and inability to get out of bed. In an attempt to mobilize out of bed on the date of presentation she fell on the floor and could not get up. When her son helped her sit upright, she became lightheaded. Her next recollection was of awakening in the ER. She had been told that she passed out.   In emergency room, patient was noted to have mild leukocytosis of 12.3 with an 83% shift. C. difficile PCR had been obtained and was pending. Patient's urinary catheter was noted to not be draining.  A new Foley catheter was placed after arrival to the ER and after that the patient started having more free-flowing urine. Her creatinine was elevated at 3.63 with a BUN of 51-baseline was 2.54. Patient had an anion gap of 20 lactic acid level of only 1.59. X-rays of her lower extremity wound were unremarkable. Surgery evaluated her leg no evidence of concern for abscess or necrotizing fasciitis.   HPI/Subjective:  Endorsing generalized malaise- states at baseline requires assistance with ADLs and IADLs  Assessment/Plan: Active Problems:   Syncope -likely from [facility] -follow on telemetry    Dehydration -from acute illness in setting of preadmission use of Lasix and Zaroxolyn -cont IVFs but since BP remains soft will increase rate to 125    Sepsis -source appears to be UTI with associated bacteremia -cont empiric anbxs but narrow (see below) -MRSA PCR negative -cont supportive care    Bacteremia -1/2 blood cx;s with gram variable rods -h/o klebsiella,citrobacter and e coli  UTI in past -cont anbx's    ? UTI -h/o of above bacterial UTI in past -all sensitive to Levaquin so will narrow from Vanco and Zosyn to Levaquin -abn UA could also be reflective of chronic foley but given bacteremia and septic presentation suspect has UTI -catheter changed in ER    Acute renal failure on Chronic kidney disease (CKD), stage IV (severe) -baseline 44/2.43 -current 53/3.16 -peak 51/3.63 -cont to hold offending meds, promote normotensive state and rehydrate    Chronic systolic heart failure/DCM/mild pulmonary HTN -compensated -ECHO with moderate reduced systolic function with mod diffuse hypokinesis in 2013    Diarrhea -has not recurred since admisison -C Diff PCR pending     Atrial fibrillation -rate controlled -alt between AF and NSR    Hyperlipidemia    Severe Hypothyroidism -Increase Synthroid 125 mcg daily -8/6 Synthroid IV 50 mcg x1 -TSH was 26.26 September 2013, now TSH= 41.3 -repeat Free T4 -Will need to monitor patient's electrolytes daily with increased Synthroid dose    Transaminitis -noted at last admit but has resolved    Wound of left leg -has been evaluated by surgery and [facility] with no new recs- no signs of infection   DVT prophylaxis: Warfarin Code Status: DO NOT RESUSCITATE Family Communication: No family at bedside Disposition Plan/Expected LOS: Step down   Consultants: None  Procedures: None  Cultures: Blood cultures: 1/2 gram variable rods Urine culture pending  Antibiotics: Zosyn 8/5 >>> 8/6 Vancomycin 8/5 >>> 8/6 Levaquin 8/6 >>>  Objective: Blood pressure 103/30, pulse 70, temperature 98.1 F (36.7 C), temperature source  Oral, resp. rate 21, height 5\' 5"  (1.651 m), weight 202 lb 9.6 oz (91.899 kg), SpO2 99.00%.  Intake/Output Summary (Last 24 hours) at 11/28/13 1234 Last data filed at 11/28/13 1007  Gross per 24 hour  Intake   3015 ml  Output   1420 ml  Net   1595 ml     Exam: Gen: No acute respiratory  distress Chest: Clear to auscultation bilaterally without wheezes, rhonchi or crackles, RA Cardiac: Irregular rate and rhythm, systolic murmur, no rubs or gallops, no peripheral edema, no JVD Abdomen: Soft nontender nondistended without obvious hepatosplenomegaly, no ascites Genitourinary; Foley in place Extremities: Symmetrical in appearance without cyanosis, clubbing or effusion  Scheduled Meds:  Scheduled Meds: . fluticasone  1 spray Each Nare QHS  . levofloxacin (LEVAQUIN) IV  250 mg Intravenous Q48H  . levothyroxine  50 mcg Oral QAC breakfast  . sodium chloride  3 mL Intravenous Q12H  . Warfarin - Pharmacist Dosing Inpatient   Does not apply q1800   Continuous Infusions: . sodium chloride 125 mL/hr (11/28/13 1007)    Data Reviewed: Basic Metabolic Panel:  Recent Labs Lab 11/27/13 1108 11/28/13 0356  NA 142 143  K 5.3 3.8  CL 101 106  CO2 21 20  GLUCOSE 132* 95  BUN 51* 53*  CREATININE 3.63* 3.16*  CALCIUM 9.5 8.1*   Liver Function Tests:  Recent Labs Lab 11/27/13 1108  AST 21  ALT 11  ALKPHOS 103  BILITOT 1.5*  PROT 8.7*  ALBUMIN 3.4*   No results found for this basename: LIPASE, AMYLASE,  in the last 168 hours No results found for this basename: AMMONIA,  in the last 168 hours CBC:  Recent Labs Lab 11/27/13 1108 11/28/13 0356  WBC 12.3* 6.4  NEUTROABS 10.9*  --   HGB 11.1* 9.4*  HCT 37.1 31.2*  MCV 98.9 99.7  PLT 260 175   Cardiac Enzymes:  Recent Labs Lab 11/27/13 1108  TROPONINI <0.30   BNP (last 3 results)  Recent Labs  03/02/13 2245 03/30/13 1815  PROBNP 1436.0* 1353.0*   CBG: No results found for this basename: GLUCAP,  in the last 168 hours  Recent Results (from the past 240 hour(s))  CULTURE, BLOOD (ROUTINE X 2)     Status: None   Collection Time    11/27/13  3:30 PM      Result Value Ref Range Status   Specimen Description BLOOD RIGHT ARM   Final   Special Requests BOTTLES DRAWN AEROBIC ONLY [facility]   Final   Culture   Setup Time     Final   Value: 11/27/2013 19:00     [facility] at [facility]   Culture     Final   Value: GRAM VARIABLE ROD     Note: Gram Stain Report Called to,Read Back By and Verified With: Bess HarvestHERESA O'LEARY@0923  ON [phone] BY [facility]     Performed at [facility]   Report Status PENDING   Incomplete  CULTURE, BLOOD (ROUTINE X 2)     Status: None   Collection Time    11/27/13  3:35 PM      Result Value Ref Range Status   Specimen Description BLOOD RIGHT HAND   Final   Special Requests BOTTLES DRAWN AEROBIC ONLY 5CC   Final   Culture  Setup Time     Final   Value: 11/27/2013 19:01     Performed at [facility]   Culture     Final  Value:        BLOOD CULTURE RECEIVED NO GROWTH TO DATE CULTURE WILL BE HELD FOR 5 DAYS BEFORE ISSUING A FINAL NEGATIVE REPORT     Performed at [facility]   Report Status PENDING   Incomplete  MRSA PCR SCREENING     Status: None   Collection Time    11/27/13  6:37 PM      Result Value Ref Range Status   MRSA by PCR NEGATIVE  NEGATIVE Final   Comment:            The GeneXpert MRSA Assay (FDA     approved for NASAL specimens     only), is one component of a     comprehensive MRSA colonization     surveillance program. It is not     intended to diagnose MRSA     infection nor to guide or     monitor treatment for     MRSA infections.     Studies:  Recent x-ray studies have been reviewed in detail by the Attending Physician  Time spent :      Junious Silk, ANP [facility] Office  [phone] Pager [phone]   **If unable to reach the above provider after paging please contact the Flow Manager @ [phone]  On-Call/Text Page:      [email or web address]      password [email or web address]  If 7PM-7AM, please contact night-coverage [email or web address] Password [email or web address] 11/28/2013, 12:34 PM   LOS: 1 day    Examined patient with ANP Revonda Standard and agree with assessment and plan. Discuss plan with family members and answered all  questions. Patient with multiple complex medical problems direct patient care> 40 minute

## 2013-11-28 NOTE — Progress Notes (Signed)
Patient ID: Margaret [name], female   DOB: [birth year], 73 y.o.   MRN: [id number]     [facility]      [address] [place], [place] [address]    Phone: [phone] FAX: [phone]     Subjective: No complaints.  Normal white count.  Afebrile.    Objective:  Vital signs:  Filed Vitals:   11/28/13 0400 11/28/13 0500 11/28/13 0650 11/28/13 0700  BP: 1$Rem'02/40 88/27 88/35 'ViXc$ 101/31  Pulse: 77 74 75 74  Temp:      TempSrc:      Resp: $Remo'19 12 16 10  'CQAyN$ Height:      Weight:      SpO2: 100% 93% 100% 98%    Last BM Date: 11/27/13  Intake/Output   Yesterday:  08/05 0701 - 08/06 0700 In: 2500 [P.O.:300; I.V.:1650; IV Piggyback:550] Out: 1095 [Urine:1095] This shift:    I/O last 3 completed shifts: In: 2500 [P.O.:300; I.V.:1650; IV Piggyback:550] Out: 1095 [Urine:1095]    Physical Exam: General: Pt awake/alert/oriented x4 in no acute distress Skin: right medial calf wound-beefy red, no purulent drainage or eschar. There is no surrounding cellulitis.     Problem List:   Principal Problem:   Prerenal azotemia Active Problems:   Atrial fibrillation   Obesity   Chronic kidney disease (CKD), stage IV (severe)   Hyperlipidemia   Chronic systolic heart failure   Hypothyroidism   Syncope   Foley catheter problem   Wound of left leg   Diarrhea   Metabolic acidosis    Results:   Labs: Results for orders placed during the hospital encounter of 11/27/13 (from the past 48 hour(s))  CBC WITH DIFFERENTIAL     Status: Abnormal   Collection Time    11/27/13 11:08 AM      Result Value Ref Range   WBC 12.3 (*) 4.0 - 10.5 K/uL   RBC 3.75 (*) 3.87 - 5.11 MIL/uL   Hemoglobin 11.1 (*) 12.0 - 15.0 g/dL   HCT 37.1  36.0 - 46.0 %   MCV 98.9  78.0 - 100.0 fL   MCH 29.6  26.0 - 34.0 pg   MCHC 29.9 (*) 30.0 - 36.0 g/dL   RDW 16.4 (*) 11.5 - 15.5 %   Platelets 260  150 - 400 K/uL   Neutrophils Relative % 88 (*) 43 - 77 %   Neutro Abs 10.9  (*) 1.7 - 7.7 K/uL   Lymphocytes Relative 6 (*) 12 - 46 %   Lymphs Abs 0.7  0.7 - 4.0 K/uL   Monocytes Relative 5  3 - 12 %   Monocytes Absolute 0.6  0.1 - 1.0 K/uL   Eosinophils Relative 1  0 - 5 %   Eosinophils Absolute 0.1  0.0 - 0.7 K/uL   Basophils Relative 0  0 - 1 %   Basophils Absolute 0.0  0.0 - 0.1 K/uL  COMPREHENSIVE METABOLIC PANEL     Status: Abnormal   Collection Time    11/27/13 11:08 AM      Result Value Ref Range   Sodium 142  137 - 147 mEq/L   Potassium 5.3  3.7 - 5.3 mEq/L   Chloride 101  96 - 112 mEq/L   CO2 21  19 - 32 mEq/L   Glucose, Bld 132 (*) 70 - 99 mg/dL   BUN 51 (*) 6 - 23 mg/dL   Creatinine, Ser 3.63 (*) 0.50 - 1.10 mg/dL   Calcium 9.5  8.4 - 10.5 mg/dL   Total Protein 8.7 (*) 6.0 - 8.3 g/dL   Albumin 3.4 (*) 3.5 - 5.2 g/dL   AST 21  0 - 37 U/L   ALT 11  0 - 35 U/L   Alkaline Phosphatase 103  39 - 117 U/L   Total Bilirubin 1.5 (*) 0.3 - 1.2 mg/dL   GFR calc non Af Amer 11 (*) >90 mL/min   GFR calc Af Amer 13 (*) >90 mL/min   Comment: (NOTE)     The eGFR has been calculated using the CKD EPI equation.     This calculation has not been validated in all clinical situations.     eGFR's persistently <90 mL/min signify possible Chronic Kidney     Disease.   Anion gap 20 (*) 5 - 15  TROPONIN I     Status: None   Collection Time    11/27/13 11:08 AM      Result Value Ref Range   Troponin I <0.30  <0.30 ng/mL   Comment:            Due to the release kinetics of cTnI,     a negative result within the first hours     of the onset of symptoms does not rule out     myocardial infarction with certainty.     If myocardial infarction is still suspected,     repeat the test at appropriate intervals.  URINALYSIS, ROUTINE W REFLEX MICROSCOPIC     Status: Abnormal   Collection Time    11/27/13 11:43 AM      Result Value Ref Range   Color, Urine YELLOW  YELLOW   APPearance CLOUDY (*) CLEAR   Specific Gravity, Urine 1.009  1.005 - 1.030   pH 8.0  5.0 -  8.0   Glucose, UA NEGATIVE  NEGATIVE mg/dL   Hgb urine dipstick MODERATE (*) NEGATIVE   Bilirubin Urine NEGATIVE  NEGATIVE   Ketones, ur NEGATIVE  NEGATIVE mg/dL   Protein, ur 30 (*) NEGATIVE mg/dL   Urobilinogen, UA 0.2  0.0 - 1.0 mg/dL   Nitrite NEGATIVE  NEGATIVE   Leukocytes, UA LARGE (*) NEGATIVE  URINE MICROSCOPIC-ADD ON     Status: Abnormal   Collection Time    11/27/13 11:43 AM      Result Value Ref Range   Squamous Epithelial / LPF RARE  RARE   WBC, UA 21-50  <3 WBC/hpf   RBC / HPF 0-2  <3 RBC/hpf   Bacteria, UA MANY (*) RARE  I-STAT CG4 LACTIC ACID, ED     Status: None   Collection Time    11/27/13  4:09 PM      Result Value Ref Range   Lactic Acid, Venous 1.59  0.5 - 2.2 mmol/L  MRSA PCR SCREENING     Status: None   Collection Time    11/27/13  6:37 PM      Result Value Ref Range   MRSA by PCR NEGATIVE  NEGATIVE   Comment:            The GeneXpert MRSA Assay (FDA     approved for NASAL specimens     only), is one component of a     comprehensive MRSA colonization     surveillance program. It is not     intended to diagnose MRSA     infection nor to guide or     monitor treatment for     MRSA infections.  PROTIME-INR     Status: Abnormal   Collection Time    11/27/13  8:16 PM      Result Value Ref Range   Prothrombin Time 36.7 (*) 11.6 - 15.2 seconds   INR 3.70 (*) 0.00 - 1.61  BASIC METABOLIC PANEL     Status: Abnormal   Collection Time    11/28/13  3:56 AM      Result Value Ref Range   Sodium 143  137 - 147 mEq/L   Potassium 3.8  3.7 - 5.3 mEq/L   Comment: DELTA CHECK NOTED   Chloride 106  96 - 112 mEq/L   CO2 20  19 - 32 mEq/L   Glucose, Bld 95  70 - 99 mg/dL   BUN 53 (*) 6 - 23 mg/dL   Creatinine, Ser 3.16 (*) 0.50 - 1.10 mg/dL   Calcium 8.1 (*) 8.4 - 10.5 mg/dL   GFR calc non Af Amer 14 (*) >90 mL/min   GFR calc Af Amer 16 (*) >90 mL/min   Comment: (NOTE)     The eGFR has been calculated using the CKD EPI equation.     This calculation has  not been validated in all clinical situations.     eGFR's persistently <90 mL/min signify possible Chronic Kidney     Disease.   Anion gap 17 (*) 5 - 15  CBC     Status: Abnormal   Collection Time    11/28/13  3:56 AM      Result Value Ref Range   WBC 6.4  4.0 - 10.5 K/uL   RBC 3.13 (*) 3.87 - 5.11 MIL/uL   Hemoglobin 9.4 (*) 12.0 - 15.0 g/dL   HCT 31.2 (*) 36.0 - 46.0 %   MCV 99.7  78.0 - 100.0 fL   MCH 30.0  26.0 - 34.0 pg   MCHC 30.1  30.0 - 36.0 g/dL   RDW 16.5 (*) 11.5 - 15.5 %   Platelets 175  150 - 400 K/uL   Comment: REPEATED TO VERIFY  PROTIME-INR     Status: Abnormal   Collection Time    11/28/13  3:56 AM      Result Value Ref Range   Prothrombin Time 38.6 (*) 11.6 - 15.2 seconds   INR 3.95 (*) 0.00 - 1.49    Imaging / Studies: Dg Tibia/fibula Right  11/27/2013   CLINICAL DATA:  Pain.  EXAM: RIGHT TIBIA AND FIBULA - 2 VIEW  COMPARISON:  None.  FINDINGS: a soft tissue wound noted over the right medial posterior calf. No radiopaque foreign bodies. Vascular calcification noted consistent peripheral vascular disease. No acute bony abnormality identified. No evidence of fracture or dislocation. No lytic lesion.  IMPRESSION: 1. Soft tissue wound. 2. Peripheral vascular disease. 3. No acute bony abnormality.   Electronically Signed   By: Marcello Moores  Register   On: 11/27/2013 16:28    Medications / Allergies:  Scheduled Meds: . fluticasone  1 spray Each Nare QHS  . levothyroxine  50 mcg Oral QAC breakfast  . sodium chloride  3 mL Intravenous Q12H  . Warfarin - Pharmacist Dosing Inpatient   Does not apply q1800   Continuous Infusions: . sodium chloride 75 mL/hr at 11/28/13 0508   PRN Meds:.acetaminophen, acetaminophen, ondansetron (ZOFRAN) IV, ondansetron, oxyCODONE  Antibiotics: Anti-infectives   Start     Dose/Rate Route Frequency Ordered Stop   11/29/13 1600  vancomycin (VANCOCIN) IVPB 1000 mg/200 mL premix  Status:  Discontinued     1,000  mg 200 mL/hr over 60 Minutes  Intravenous Every 48 hours 11/27/13 1627 11/27/13 1840   11/27/13 1500  vancomycin (VANCOCIN) 1,500 mg in sodium chloride 0.9 % 500 mL IVPB     1,500 mg 250 mL/hr over 120 Minutes Intravenous STAT 11/27/13 1459 11/27/13 1836   11/27/13 1500  piperacillin-tazobactam (ZOSYN) IVPB 2.25 g  Status:  Discontinued     2.25 g 100 mL/hr over 30 Minutes Intravenous 3 times per day 11/27/13 1459 11/27/13 1840        Assessment/Plan Chronic right calf wound: she has been followed by the wound center as outpatient.  The wound is clean and therefore no role for surgical intervention.  Will consult to [facility] for further management and dressing changes.  In the meantime, I have placed a wet to dry dressing. Surgery is signing off.  Please do not hesitate to contact [facility] with any questions or concerns.    Erby Pian, [facility] Pager [phone] Office [phone]  11/28/2013 7:54 AM

## 2013-11-28 NOTE — Progress Notes (Signed)
Margaret [name] notified of BC one bottle with Gram Variable Rods. Antibiotics ordered previously, no new orders at this time.

## 2013-11-28 NOTE — Progress Notes (Signed)
Mrs. Mutchler BP noted to be decreasing. Currently at 70s systolic. [facility] NP on call notified and order for fluid bolus obtained. Will continue to monitor. Patient is sleepy, but otherwise no complaints.

## 2013-11-28 NOTE — Progress Notes (Signed)
[facility] hosp. on-call NP notified about patient's BP, which remains low. Patient is still sleepy, but arousable and A/O. NS increased to 158ml/hr until 7am. Will continue to monitor closely.

## 2013-11-28 NOTE — Evaluation (Signed)
Physical Therapy Evaluation Patient Details Name: Margaret [name] MRN: [id number] DOB: 09-30-1940 Today's Date: 11/28/2013   History of Present Illness  Pt is a 73 y/o female with a PMH of chronic systolic heart failure, a chronic Foley catheter, a chronic left LE wound and stage IV CKD who had continuous diarrhea 2-3 days PTA. Patient had been so weak, but she could not get out of bed. In trying on day of admission, she fell on the floor and could not get up. When her son helped her sit upright, she became lightheaded. She had been told that she passed out.  Clinical Impression  Pt admitted with the above. Pt currently with functional limitations due to the deficits listed below (see PT Problem List). At the time of PT eval, pt reports that she has not been ambulatory for months. Unsure of level of assist required at home to perform ADL's, however today +2 assist required for bed mobility to EOB. Recommending SNF to improve safety and decrease burden of care on family prior to returning home. Pt will benefit from skilled PT to increase their independence and safety with mobility to allow discharge to the venue listed below.       Follow Up Recommendations SNF;Supervision/Assistance - 24 hour    Equipment Recommendations  None recommended by PT    Recommendations for Other Services       Precautions / Restrictions Precautions Precautions: Fall Precaution Comments: Pt states she has been nonambulatory for months Restrictions Weight Bearing Restrictions: No      Mobility  Bed Mobility Overal bed mobility: Needs Assistance;+2 for physical assistance Bed Mobility: Rolling;Sidelying to Sit;Sit to Sidelying Rolling: Mod assist Sidelying to sit: Max assist;+2 for physical assistance     Sit to sidelying: Max assist;+2 for physical assistance General bed mobility comments: Bed pad used to assist with mobility. Pt attempting to assist with trunk elevation but was unable. VC's for hand  placement to attempt scooting to EOB, however bed pad used for max assist with scooting. Pt with little control while descending back to S/L position, and max assist was required to scoot HOB.   Transfers                    Ambulation/Gait                Stairs            Wheelchair Mobility    Modified Rankin (Stroke Patients Only)       Balance Overall balance assessment: Needs assistance Sitting-balance support: Feet supported;Bilateral upper extremity supported Sitting balance-Leahy Scale: Poor Sitting balance - Comments: Able to hold <30' at a time without UE support on bed.  Postural control: Posterior lean;Right lateral lean                                   Pertinent Vitals/Pain Pain Assessment: No/denies pain    Home Living Family/patient expects to be discharged to:: Private residence Living Arrangements: Spouse/significant other Available Help at Discharge: Family;Available 24 hours/day Type of Home: House Home Access: Ramped entrance     Home Layout: One level Home Equipment: Bedside commode;Cane - single point;Hospital bed;Wheelchair - manual      Prior Function Level of Independence: Needs assistance   Gait / Transfers Assistance Needed: Per previous admission, pt was doing transfers with mod assist from family. Today, pt very vague with level of assist  she requires, repeatedly stating "my family helps me get into my lap chair, and I have a wheelchair when I go out."  ADL's / Homemaking Assistance Needed: Per previous admission, daughter assisting with ADL's. Family doing all homemaking.        Hand Dominance   Dominant Hand: Right    Extremity/Trunk Assessment   Upper Extremity Assessment: Generalized weakness           Lower Extremity Assessment: Generalized weakness;RLE deficits/detail RLE Deficits / Details: Chronic wound on calf area.    Cervical / Trunk Assessment: Kyphotic  Communication    Communication: No difficulties  Cognition Arousal/Alertness: Awake/alert Behavior During Therapy: WFL for tasks assessed/performed Overall Cognitive Status: Within Functional Limits for tasks assessed                      General Comments      Exercises General Exercises - Lower Extremity Ankle Circles/Pumps: 20 reps Quad Sets: 10 reps Long Arc Quad: 10 reps Hip ABduction/ADduction: 10 reps      Assessment/Plan    PT Assessment Patient needs continued PT services  PT Diagnosis Difficulty walking;Generalized weakness   PT Problem List Decreased strength;Decreased range of motion;Decreased activity tolerance;Decreased balance;Decreased mobility;Decreased knowledge of use of DME;Decreased knowledge of precautions;Decreased safety awareness  PT Treatment Interventions DME instruction;Gait training;Stair training;Functional mobility training;Therapeutic activities;Therapeutic exercise;Neuromuscular re-education;Patient/family education   PT Goals (Current goals can be found in the Care Plan section) Acute Rehab PT Goals Patient Stated Goal: To return home PT Goal Formulation: With patient Time For Goal Achievement: 12/12/13 Potential to Achieve Goals: Fair    Frequency Min 2X/week   Barriers to discharge        Co-evaluation               End of Session Equipment Utilized During Treatment: Oxygen Activity Tolerance: Patient limited by fatigue Patient left: in bed;with call bell/phone within reach Nurse Communication: Mobility status         Time: [id number] PT Time Calculation (min): 32 min   Charges:   PT Evaluation $Initial PT Evaluation Tier I: 1 Procedure PT Treatments $Therapeutic Exercise: 8-22 mins $Therapeutic Activity: 8-22 mins   PT G Codes:          Ruthann CancerHamilton, Hewitt Garner 11/28/2013, 5:20 PM  Ruthann CancerLaura Hamilton, PT, DPT Acute Rehabilitation Services Pager: [phone]

## 2013-11-28 NOTE — Consult Note (Signed)
WOC wound follow up Wound type: venous stasis ulcers Measurement: R Medial calf: 2.5cm x 4.0cm x 0.2cm  R medial calf 0.5cm x 0.5cm x 0.2cm  R posterior calf: 1.5cm x 1.0cm x 0.2cm  She has multiple pinpoint open areas posteriorly and on medially  Wound bed: all of the wounds are clean, 100% granulation tissue, moist Drainage (amount, consistency, odor) minimal Periwound: intact Dressing procedure/placement/frequency: Discussed POC with [facility]CC since the patient in unclear on the level of compression used. She sees the wound care center MD weekly and I called them to verify current wound care orders. Will use hydrogel for topical care for moist wound care and dry compression as per the [facility].  Dugan Vanhoesen [place] RN,CWOCN [phone]

## 2013-11-28 NOTE — Progress Notes (Signed)
I have seen and examined the patient and agree with the assessment and plans. Continue current care.  Signing off from surgery standpoint  Margaret [name] A. Magnus Ivan  MD, FACS

## 2013-11-28 NOTE — Progress Notes (Addendum)
ANTICOAGULATION CONSULT NOTE   Pharmacy Consult for Warfarin Indication: atrial fibrillation  No Known Allergies  Patient Measurements: Height: 5\' 5"  (165.1 cm) Weight: 202 lb 9.6 oz (91.899 kg) IBW/kg (Calculated) : 57 Heparin Dosing Weight:   Vital Signs: Temp: 97.6 F (36.4 C) (08/06 0700) Temp src: Oral (08/06 0700) BP: 103/30 mmHg (08/06 0900) Pulse Rate: 82 (08/06 0900)  Labs:  Recent Labs  11/27/13 1108 11/27/13 2016 11/28/13 0356  HGB 11.1*  --  9.4*  HCT 37.1  --  31.2*  PLT 260  --  175  LABPROT  --  36.7* 38.6*  INR  --  3.70* 3.95*  CREATININE 3.63*  --  3.16*  TROPONINI <0.30  --   --     Estimated Creatinine Clearance: 17.8 ml/min (by C-G formula based on Cr of 3.16).   Medical History: Past Medical History  Diagnosis Date  . NICM (nonischemic cardiomyopathy)     EF 35%; cath 2/12 no CAD  . Systolic CHF, chronic   . Severe mitral regurgitation     s/p complex mitral valve repair with cox maze + LAA clipping, removal of RV lead, and implantation of CRT-D in abdomen  . Cardiac arrest - ventricular fibrillation     in 1990s  . Atrial fibrillation     amiodarone  . HTN (hypertension)   . HLD (hyperlipidemia)   . Achalasia     s/p dilation  . Recurrent UTI   . Obesity   . Anoxic brain injury 1996    s/p cardiac arrest   . Automatic implantable cardioverter-defibrillator in situ   . Hypothyroidism   . TIA (transient ischemic attack)   . Chronic kidney disease (CKD), stage IV (severe)     Margaret [name] 10/03/2013    Assessment: Pt admitted for R calf wound and multiple days of diarrhea.  On warfarin PTA for AFib.   Admit INR is supratherapeutic and trending up today at 3.9 after low dose given to avoid fluctuations last night. Will unfortunately have to hold warfarin tonight.  Historically INRs have been within appropriate range.  Abx changed to levaquin monotherapy which can influence INR, will continue to follow closely. If INR stabilizes and  levaquin continues may need to adjust home regimen.  PTA dose: warfarin 2 mg po daily.  Goal of Therapy:  INR 2-3 Monitor platelets by anticoagulation protocol: Yes   Plan:  Hold warfarin tonight Daily INR Monitor closely for s/sx bleeding  Sheppard Coil PharmD., BCPS Clinical Pharmacist Pager [phone] 11/28/2013 10:41 AM

## 2013-11-28 NOTE — Progress Notes (Signed)
Utilization review completed. Bedelia Pong, RN, BSN. 

## 2013-11-29 LAB — COMPREHENSIVE METABOLIC PANEL
ALK PHOS: 67 U/L (ref 39–117)
ALT: 7 U/L (ref 0–35)
AST: 16 U/L (ref 0–37)
Albumin: 2.4 g/dL — ABNORMAL LOW (ref 3.5–5.2)
Anion gap: 15 (ref 5–15)
BILIRUBIN TOTAL: 0.5 mg/dL (ref 0.3–1.2)
BUN: 38 mg/dL — ABNORMAL HIGH (ref 6–23)
CHLORIDE: 111 meq/L (ref 96–112)
CO2: 19 meq/L (ref 19–32)
Calcium: 7.6 mg/dL — ABNORMAL LOW (ref 8.4–10.5)
Creatinine, Ser: 2.27 mg/dL — ABNORMAL HIGH (ref 0.50–1.10)
GFR, EST AFRICAN AMERICAN: 23 mL/min — AB (ref >90)
GFR, EST NON AFRICAN AMERICAN: 20 mL/min — AB (ref >90)
GLUCOSE: 88 mg/dL (ref 70–99)
Potassium: 3.7 mEq/L (ref 3.7–5.3)
SODIUM: 145 meq/L (ref 137–147)
Total Protein: 6.4 g/dL (ref 6.0–8.3)

## 2013-11-29 LAB — PROTIME-INR
INR: 2.93 — ABNORMAL HIGH (ref 0.00–1.49)
PROTHROMBIN TIME: 30.6 s — AB (ref 11.6–15.2)

## 2013-11-29 LAB — CBC
HCT: 28.2 % — ABNORMAL LOW (ref 36.0–46.0)
HEMOGLOBIN: 8.5 g/dL — AB (ref 12.0–15.0)
MCH: 29.1 pg (ref 26.0–34.0)
MCHC: 30.1 g/dL (ref 30.0–36.0)
MCV: 96.6 fL (ref 78.0–100.0)
PLATELETS: 158 10*3/uL (ref 150–400)
RBC: 2.92 MIL/uL — AB (ref 3.87–5.11)
RDW: 16 % — ABNORMAL HIGH (ref 11.5–15.5)
WBC: 3.3 10*3/uL — AB (ref 4.0–10.5)

## 2013-11-29 LAB — PHOSPHORUS: Phosphorus: 3.1 mg/dL (ref 2.3–4.6)

## 2013-11-29 LAB — MAGNESIUM: MAGNESIUM: 2.1 mg/dL (ref 1.5–2.5)

## 2013-11-29 MED ORDER — WARFARIN SODIUM 1 MG PO TABS
1.0000 mg | ORAL_TABLET | Freq: Once | ORAL | Status: AC
  Administered 2013-11-29: 1 mg via ORAL
  Filled 2013-11-29: qty 1

## 2013-11-29 MED ORDER — LEVOTHYROXINE SODIUM 50 MCG PO TABS
50.0000 ug | ORAL_TABLET | Freq: Every day | ORAL | Status: DC
  Administered 2013-11-30 – 2013-12-02 (×3): 50 ug via ORAL
  Filled 2013-11-29 (×4): qty 1

## 2013-11-29 MED ORDER — LEVOTHYROXINE SODIUM 50 MCG PO TABS: 50.0000 ug | ORAL_TABLET | Freq: Every day | ORAL | Status: DC

## 2013-11-29 MED ORDER — FOLIC ACID 1 MG PO TABS
1.0000 mg | ORAL_TABLET | Freq: Every day | ORAL | Status: DC
  Administered 2013-11-30 – 2013-12-02 (×3): 1 mg via ORAL
  Filled 2013-11-29 (×3): qty 1

## 2013-11-29 NOTE — Evaluation (Signed)
Occupational Therapy Evaluation Patient Details Name: Margaret [name] MRN: [id number] DOB: Feb 28, 1941 Today's Date: 11/29/2013    History of Present Illness 73 y.o. female admitted with 2-3 days of continuous diarrhea and reports of extreme weakness and inability to get OOB which resulted in a fall at home.  Dx Sepsis likely from UTI; ARF on CKD stage IV.  PMH includes:  chronic systolic heart failure, hypothyroidism, chronic Foley catheter, chronic left lower extremity wound and stage IV chronic kidney disease   Clinical Impression   Pt admitted with above. She demonstrates the below listed deficits and will benefit from continued OT to maximize safety and independence with BADLs.  Pt presents to OT with generalized weakness/deconditioning and impaired balance.  She is unable to provide reliable PLOF and most information was gleaned from chart review.  Pt, it appears was discharged home from [facility] 10/23/13 and husband and daughter have been assisting her at home.  Pt indicates that her husband has been at [facility] and is discharging home today 11/29/13, and that her sister in law is planning to hire caregivers to assist them at home.  It sounds like pt assisted very little with BADLs, but that she was able to perform basic transfers with minimal assistance.  Currently, she is requiring mod A for transfers.   If family is unable to provide this level of assistance at discharge, then she likely will need a return to SNF for further rehab.   If she has adequate assistance at home, then home with [facility]        Follow Up Recommendations  SNF;Other (comment) (if 24 hour assistance is not available at discharge)    Equipment Recommendations  None recommended by OT    Recommendations for Other Services       Precautions / Restrictions Precautions Precautions: Fall Precaution Comments: Pt states she has been non ambulatory, but when asked details of her day, she does indicate she was working on being  able to ambulate to BR with therapy (however, memory is impaired and unable to confirm this)      Mobility Bed Mobility                  Transfers Overall transfer level: Needs assistance   Transfers: Sit to/from Stand;Stand Pivot Transfers Sit to Stand: Mod assist Stand pivot transfers: Mod assist       General transfer comment: Pt with posterior bias.  Pt frequently states she is going to fall when standing and immediately attempts to sit.  When she is distracted talking about her family is able to maintain standing to simulate ADLs, she was able to stand x 2.5 mins     Balance Overall balance assessment: Needs assistance Sitting-balance support: Feet supported Sitting balance-Leahy Scale: Fair     Standing balance support: Bilateral upper extremity supported Standing balance-Leahy Scale: Poor                              ADL Overall ADL's : Needs assistance/impaired Eating/Feeding: Set up;Sitting   Grooming: Wash/dry hands;Wash/dry face;Oral care;Brushing hair;Set up;Sitting   Upper Body Bathing: Minimal assitance;Sitting   Lower Body Bathing: Maximal assistance;Sit to/from stand   Upper Body Dressing : Minimal assistance;Sitting   Lower Body Dressing: Total assistance;Sit to/from stand   Toilet Transfer: Moderate assistance;Stand-pivot;BSC   Toileting- Clothing Manipulation and Hygiene: Total assistance;Sit to/from stand       Functional mobility during ADLs: Moderate assistance  General ADL Comments: Pt resistant to OT initially stating that she doesn't do anything at home, and she will have therapy and assist when she gets home.  With encouragement and discussion of her needs when she returns home, she agreed to work with OT     Vision                     Perception     Praxis      Pertinent Vitals/Pain Pain Assessment: No/denies pain     Hand Dominance Right   Extremity/Trunk Assessment Upper Extremity  Assessment Upper Extremity Assessment: Generalized weakness   Lower Extremity Assessment Lower Extremity Assessment: Defer to PT evaluation   Cervical / Trunk Assessment Cervical / Trunk Assessment: Kyphotic   Communication Communication Communication: No difficulties   Cognition Arousal/Alertness: Awake/alert Behavior During Therapy: WFL for tasks assessed/performed Overall Cognitive Status: No family/caregiver present to determine baseline cognitive functioning (pt is poor historian)       Memory: Decreased short-term memory             General Comments       Exercises       Shoulder Instructions      Home Living Family/patient expects to be discharged to:: Private residence Living Arrangements: Spouse/significant other Available Help at Discharge: Family;Available 24 hours/day Type of Home: House Home Access: Ramped entrance     Home Layout: One level     Bathroom Shower/Tub:  (sponge bathes)   Bathroom Toilet: Standard (uses BSC)     Home Equipment: Bedside commode;Cane - single point;Hospital bed;Wheelchair - manual   Additional Comments: Pt reports that her daughter stays with her during the day and spouse has been in [facility], and is discharging today.  She repeatedly states that her sister in law is going to hire in home caregivers      Prior Functioning/Environment Level of Independence: Needs assistance  Gait / Transfers Assistance Needed: Per previous admissions, pt was transferring with mod A.   Pt was at [facility] until 10/23/13.  Pt states she does not walk, but later when asked details of her day states she sometimes walks into the bathroom with therapy, but is unsure if this is accurate ADL's / Homemaking Assistance Needed: per pt report and per chart review.  Daughter assists her with bathing and dressing.  Pt assists with UB ADLs and performs toilet transfers with min - mod A        OT Diagnosis: Generalized weakness;Cognitive deficits    OT Problem List: Decreased strength;Decreased activity tolerance;Impaired balance (sitting and/or standing);Decreased knowledge of use of DME or AE;Decreased safety awareness;Obesity   OT Treatment/Interventions: Self-care/ADL training;DME and/or AE instruction;Therapeutic activities;Cognitive remediation/compensation;Patient/family education;Balance training    OT Goals(Current goals can be found in the care plan section) Acute Rehab OT Goals Patient Stated Goal: to go home  OT Goal Formulation: With patient Time For Goal Achievement: 12/13/13 Potential to Achieve Goals: Good ADL Goals Pt Will Perform Upper Body Bathing: with set-up;with supervision;sitting Pt Will Transfer to Toilet: with min assist;stand pivot transfer;bedside commode Additional ADL Goal #1: Pt will be able to stand with min A x 3 mins to assist with LB ADLs and peri care  OT Frequency: Min 2X/week   Barriers to D/C:    Pt states that her sister in law is going to hire caregivers       Co-evaluation              End of Session  Nurse Communication: Mobility status  Activity Tolerance: Patient limited by fatigue Patient left: in chair;with call bell/phone within reach   Time: 1410-1427 OT Time Calculation (min): 17 min Charges:  OT General Charges $OT Visit: 1 Procedure OT Evaluation $Initial OT Evaluation Tier I: 1 Procedure OT Treatments $Therapeutic Activity: 8-22 mins G-Codes:    Rachyl Wuebker M 11/29/2013, 2:49 PM

## 2013-11-29 NOTE — Care Management Note (Addendum)
CARE MANAGEMENT NOTE 11/29/2013   Patient:  LILYANNA, REHM   Account Number:  [id number]  Date Initiated:  11/29/2013  Documentation initiated by:  Delani Kohli  Subjective/Objective Assessment:   CM following for progression and d/c planning.     Action/Plan:   Noted pt active with [facility] for HHPT and HHOT, they will resume services at time of d/c, per CSW this pt wishes to return to home with [facility] and family support.   Anticipated DC Date:     Anticipated DC Plan:  HOME W HOME HEALTH SERVICES         Choice offered to / List presented to:          [facility] arranged  HH-1 RN  HH-2 PT  HH-3 OT      [facility] agency  [facility]   Status of service:  In process, will continue to follow Medicare Important Message given?   (If response is "NO", the following Medicare IM given date fields will be blank) Date Medicare IM given:   Medicare IM given by:   Date Additional Medicare IM given:   Additional Medicare IM given by:    Discharge Disposition:    Per UR Regulation:    If discussed at Long Length of Stay Meetings, dates discussed:    Comments:   11/29/2013 Noted OT recommendation for return to SNF, will need to discuss with family to determine if they are able to assist 24/7 or provide caregivers, also noted that husband has just been d/c from SNF. Will continue to follow. Johny Shock RN MPH, case manager, [phone]

## 2013-11-29 NOTE — Progress Notes (Signed)
CSW spoke with pt re: disposition.  Pt plans on returning home with continued [facility] services through [facility].  No other SW needs identified, CSW signing off.

## 2013-11-29 NOTE — Progress Notes (Signed)
OT Cancellation Note  Patient Details Name: Margaret [name] MRN: [id number] DOB: November 28, 1940   Cancelled Treatment:    Reason Eval/Treat Not Completed: Pt eating lunch.  Will try back later.  Angelene Giovanni Burden, OTR/L [phone]  11/29/2013, 1:03 PM

## 2013-11-29 NOTE — Progress Notes (Signed)
ANTICOAGULATION CONSULT NOTE   Pharmacy Consult for Warfarin Indication: atrial fibrillation  No Known Allergies  Patient Measurements: Height: 5\' 5"  (165.1 cm) Weight: 202 lb 9.6 oz (91.899 kg) IBW/kg (Calculated) : 57 Heparin Dosing Weight:   Vital Signs: Temp: 98.1 F (36.7 C) (08/07 0803) Temp src: Oral (08/07 0803) BP: 147/54 mmHg (08/07 0803) Pulse Rate: 79 (08/07 0803)  Labs:  Recent Labs  11/27/13 1108 11/27/13 2016 11/28/13 0356 11/29/13 0740  HGB 11.1*  --  9.4* 8.5*  HCT 37.1  --  31.2* 28.2*  PLT 260  --  175 158  LABPROT  --  36.7* 38.6* 30.6*  INR  --  3.70* 3.95* 2.93*  CREATININE 3.63*  --  3.16* 2.27*  TROPONINI <0.30  --   --   --     Estimated Creatinine Clearance: 24.7 ml/min (by C-G formula based on Cr of 2.27).   Medical History: Past Medical History  Diagnosis Date  . NICM (nonischemic cardiomyopathy)     EF 35%; cath 2/12 no CAD  . Systolic CHF, chronic   . Severe mitral regurgitation     s/p complex mitral valve repair with cox maze + LAA clipping, removal of RV lead, and implantation of CRT-D in abdomen  . Cardiac arrest - ventricular fibrillation     in 1990s  . Atrial fibrillation     amiodarone  . HTN (hypertension)   . HLD (hyperlipidemia)   . Achalasia     s/p dilation  . Recurrent UTI   . Obesity   . Anoxic brain injury 1996    s/p cardiac arrest   . Automatic implantable cardioverter-defibrillator in situ   . Hypothyroidism   . TIA (transient ischemic attack)   . Chronic kidney disease (CKD), stage IV (severe)     [name] 10/03/2013    Assessment: Pt admitted for R calf wound and multiple days of diarrhea.  On warfarin PTA for AFib.   Admit INR is supratherapeutic and continued to trend up yesterday but now down to 2.9 after holding dose last night.  Historically INRs have been within appropriate range. Will give low dose tonight.  Patient continues on levaquin which can influence INR, will continue to follow  closely. If INR stabilizes and levaquin continues may need to adjust home regimen.  PTA dose: warfarin 2 mg po daily.  Goal of Therapy:  INR 2-3 Monitor platelets by anticoagulation protocol: Yes   Plan:  Warfarin 1mg  tonight Daily INR Monitor closely for s/sx bleeding  Sheppard Coil PharmD., BCPS Clinical Pharmacist Pager [phone] 11/29/2013 11:35 AM

## 2013-11-29 NOTE — Progress Notes (Signed)
Margaret [name] - Stepdown / ICU Progress Note  Vaughan SineCarolyn S Linse ZOX:[id number] DOB: 03/06/1941 DOA: 11/27/2013 PCP: Allean FoundSMITH,CANDACE THIELE, MD   Brief narrative: 73 y.o.F w/ Hx chronic systolic heart failure, hypothyroidism, chronic Foley catheter, chronic left lower extremity wound and stage IV chronic kidney disease. For 2-3 days she had continuous diarrhea. Patient reported extreme weakness and inability to get out of bed. In an attempt to mobilize out of bed on the date of presentation she fell on the floor and could not get up. When her son helped her sit upright, she became lightheaded. Her next recollection was of awakening in the ER. She had been told that she passed out.   In emergency room, patient was noted to have mild leukocytosis of 12.3 with an 83% shift. C. difficile PCR had been obtained and was pending. Patient's urinary catheter was noted to not be draining.  A new Foley catheter was placed after arrival to the ER and after that the patient started having more free-flowing urine. Her creatinine was elevated at 3.63 with a BUN of 51-baseline was 2.54. Patient had an anion gap of 20 lactic acid level of only [name].59. X-rays of her lower extremity wound were unremarkable. Surgery evaluated her leg no evidence of concern for abscess or necrotizing fasciitis.   HPI/Subjective: Endorsing generalized malaise- states at baseline requires assistance with ADLs  Assessment/Plan:    [facility]    Dehydration -from acute illness in setting of preadmission use of Lasix and Zaroxolyn -cont IVFs but since BP improved will decrease rate    Sepsis -source appears to be UTI with associated bacteremia -cont empiric anbxs but narrow (see below) -MRSA PCR negative -cont supportive care    Gm Neg Rod Bacteremia -[name]/2 blood cx's with gram negative rods -h/o klebsiella,citrobacter and e coli UTI in past -cont anbx's    Gm Neg Rod UTI -h/o of above bacterial UTI in past -all  sensitive to Levaquin so narrowed from Vanco and Zosyn to Levaquin on 8/6 -abn UA could also be reflective of chronic foley but given bacteremia and septic presentation suspect has UTI -catheter changed in ER    Acute renal failure on Chronic kidney disease (CKD), stage IV (severe) -baseline 44/2.43 -resolved -peak 51/3.63 -cont to hold offending meds, promote normotensive state and rehydrate gently    Chronic systolic heart failure/DCM/mild pulmonary HTN -compensated -ECHO with moderate reduced systolic function with mod diffuse hypokinesis in 2013    Diarrhea -has not recurred since admisison     Atrial fibrillation -rate controlled -alt between AF and NSR    Hyperlipidemia    Hypothyroidism -TSH was 26.26 September 2013, TSH 41.3 this admit with normal T4 so continue home dose Synthroid -? Question accuracy of TSH given T4 normal - f/u as outpt when not acutely ill in 4-6 weeks     Transaminitis -noted at last admit but has resolved    Wound of left leg -has been evaluated by surgery and [facility] with no new recs- no signs of infection   DVT prophylaxis: Warfarin Code Status: DO NOT RESUSCITATE Family Communication: No family at bedside Disposition Plan/Expected LOS: Transfer to floor  Consultants: None  Procedures: None  Antibiotics: Zosyn 8/5 >>> 8/6 Vancomycin 8/5 >>> 8/6 Levaquin 8/6 >>>  Objective: Blood pressure 124/43, pulse 79, temperature 97.4 F (36.3 C), temperature source Oral, resp. rate 12, height 5\' 5"  ([name].651 m), weight 202 lb 9.6 oz (91.899 kg), SpO2 94.00%.  Intake/Output Summary (Last  24 hours) at 11/29/13 1420 Last data filed at 11/29/13 1200  Gross per 24 hour  Intake   3000 ml  Output   1095 ml  Net   1905 ml   Exam: Gen: No acute respiratory distress Chest: Clear to auscultation bilaterally without wheezes, rhonchi or crackles, RA Cardiac: Irregular rate and rhythm, systolic murmur, no rubs or gallops, no peripheral edema, no  JVD Abdomen: Soft nontender nondistended without obvious hepatosplenomegaly, no ascites Genitourinary; Foley in place Extremities: Symmetrical in appearance without cyanosis, clubbing or effusion  Scheduled Meds:  Scheduled Meds: . fluticasone  [name] spray Each Nare QHS  . levofloxacin (LEVAQUIN) IV  250 mg Intravenous Q48H  . levothyroxine  125 mcg Oral QAC breakfast  . sodium chloride  3 mL Intravenous Q12H  . warfarin  [name] mg Oral ONCE-1800  . Warfarin - Pharmacist Dosing Inpatient   Does not apply q1800   Data Reviewed: Basic Metabolic Panel:  Recent Labs Lab 11/27/13 1108 11/28/13 0356 11/29/13 0740  NA 142 143 145  K 5.3 3.8 3.7  CL 101 106 111  CO2 21 20 19   GLUCOSE 132* 95 88  BUN 51* 53* 38*  CREATININE 3.63* 3.16* 2.27*  CALCIUM 9.5 8.[name]* 7.6*  MG  --   --  2.[name]  PHOS  --   --  3.[name]   Liver Function Tests:  Recent Labs Lab 11/27/13 1108 11/29/13 0740  AST 21 16  ALT 11 7  ALKPHOS 103 67  BILITOT [name].5* 0.5  PROT 8.7* 6.4  ALBUMIN 3.4* 2.4*   CBC:  Recent Labs Lab 11/27/13 1108 11/28/13 0356 11/29/13 0740  WBC 12.3* 6.4 3.3*  NEUTROABS 10.9*  --   --   HGB 11.[name]* 9.4* 8.5*  HCT 37.[name] 31.2* 28.2*  MCV 98.9 99.7 96.6  PLT 260 175 158   Cardiac Enzymes:  Recent Labs Lab 11/27/13 1108  TROPONINI <0.30   BNP (last 3 results)  Recent Labs  03/02/13 2245 03/30/13 1815  PROBNP 1436.0* 1353.0*    Recent Results (from the past 240 hour(s))  CULTURE, BLOOD (ROUTINE X 2)     Status: None   Collection Time    11/27/13  3:30 PM      Result Value Ref Range Status   Specimen Description BLOOD RIGHT ARM   Final   Special Requests BOTTLES DRAWN AEROBIC ONLY [facility]   Final   Culture  Setup Time     Final   Value: 11/27/2013 19:00     Performed at [facility]   Culture     Final   Value: GRAM NEGATIVE RODS     Note: Gram Stain Report Called to,Read Back By and Verified With: THERESA O'LEARY@0923  ON [id number] BY [facility]     Performed at [facility]   Report Status PENDING   Incomplete  CULTURE, BLOOD (ROUTINE X 2)     Status: None   Collection Time    11/27/13  3:35 PM      Result Value Ref Range Status   Specimen Description BLOOD RIGHT HAND   Final   Special Requests BOTTLES DRAWN AEROBIC ONLY 5CC   Final   Culture  Setup Time     Final   Value: 11/27/2013 19:01     Performed at [facility]   Culture     Final   Value:        BLOOD CULTURE RECEIVED NO GROWTH TO DATE CULTURE WILL BE HELD FOR 5 DAYS  BEFORE ISSUING A FINAL NEGATIVE REPORT     Performed at [facility]   Report Status PENDING   Incomplete  MRSA PCR SCREENING     Status: None   Collection Time    11/27/13  6:37 PM      Result Value Ref Range Status   MRSA by PCR NEGATIVE  NEGATIVE Final   Comment:            The GeneXpert MRSA Assay (FDA     approved for NASAL specimens     only), is one component of a     comprehensive MRSA colonization     surveillance program. It is not     intended to diagnose MRSA     infection nor to guide or     monitor treatment for     MRSA infections.     Studies:  Recent x-ray studies have been reviewed in detail by the Attending Physician  Time spent : 35 mins  Junious Silk, ANP [facility] Office  [phone] Pager [phone]   **If unable to reach the above provider after paging please contact the Flow Manager @ [phone]  On-Call/Text Page:      [email or web address]      password [email or web address]  If 7PM-7AM, please contact night-coverage [email or web address] Password [email or web address] 11/29/2013, 2:20 PM   LOS: 2 days   I have personally examined this patient and reviewed the entire database. I have reviewed the above note, made any necessary editorial changes, and agree with its content.  Lonia Blood, MD [facility]

## 2013-11-29 NOTE — Progress Notes (Signed)
Transfer note:  Arrival Method: bed from ICU Mental Orientation: A &O x 4 Telemetry: none ordered Assessment: See Doc Flowsheets Skin: dressing on right leg, clean, dry, and intact IV: left arm IV, NS at 75/hr infusing Pain: denies Tubes: foley catheter- chronic used at home Fall Prevention Safety Plan: educated patient on fall prevention safety plan, use of call bell.  Bed alarm on.  Non-slip socks on patient Admission Screening: previously completed  6700 Orientation: Patient has been oriented to the unit, staff and to the room.

## 2013-11-29 NOTE — Progress Notes (Signed)
[facility]  Patient Status: Active (receiving services up to time of hospitalization)  [facility] is providing the following services: PT and OT  If patient discharges after hours, please call [phone].   Margaret [name] 11/29/2013, 12:26 PM

## 2013-11-30 LAB — COMPREHENSIVE METABOLIC PANEL
ALK PHOS: 67 U/L (ref 39–117)
ALT: 8 U/L (ref 0–35)
AST: 15 U/L (ref 0–37)
Albumin: 2.2 g/dL — ABNORMAL LOW (ref 3.5–5.2)
Anion gap: 14 (ref 5–15)
BUN: 28 mg/dL — AB (ref 6–23)
CO2: 19 mEq/L (ref 19–32)
Calcium: 7.8 mg/dL — ABNORMAL LOW (ref 8.4–10.5)
Chloride: 109 mEq/L (ref 96–112)
Creatinine, Ser: 1.75 mg/dL — ABNORMAL HIGH (ref 0.50–1.10)
GFR calc non Af Amer: 28 mL/min — ABNORMAL LOW (ref >90)
GFR, EST AFRICAN AMERICAN: 32 mL/min — AB (ref >90)
GLUCOSE: 88 mg/dL (ref 70–99)
POTASSIUM: 3.4 meq/L — AB (ref 3.7–5.3)
Sodium: 142 mEq/L (ref 137–147)
Total Bilirubin: 0.4 mg/dL (ref 0.3–1.2)
Total Protein: 6.1 g/dL (ref 6.0–8.3)

## 2013-11-30 LAB — CBC
HEMATOCRIT: 26.3 % — AB (ref 36.0–46.0)
HEMOGLOBIN: 8.1 g/dL — AB (ref 12.0–15.0)
MCH: 29.6 pg (ref 26.0–34.0)
MCHC: 30.8 g/dL (ref 30.0–36.0)
MCV: 96 fL (ref 78.0–100.0)
Platelets: 176 10*3/uL (ref 150–400)
RBC: 2.74 MIL/uL — ABNORMAL LOW (ref 3.87–5.11)
RDW: 16.1 % — AB (ref 11.5–15.5)
WBC: 3.8 10*3/uL — AB (ref 4.0–10.5)

## 2013-11-30 LAB — URINE CULTURE

## 2013-11-30 LAB — CULTURE, BLOOD (ROUTINE X 2)

## 2013-11-30 LAB — PROTIME-INR
INR: 2.33 — ABNORMAL HIGH (ref 0.00–1.49)
PROTHROMBIN TIME: 25.6 s — AB (ref 11.6–15.2)

## 2013-11-30 MED ORDER — LEVOFLOXACIN IN D5W 750 MG/150ML IV SOLN
750.0000 mg | INTRAVENOUS | Status: DC
  Filled 2013-11-30: qty 150

## 2013-11-30 MED ORDER — POTASSIUM CHLORIDE CRYS ER 20 MEQ PO TBCR
40.0000 meq | EXTENDED_RELEASE_TABLET | Freq: Once | ORAL | Status: AC
  Administered 2013-11-30: 40 meq via ORAL
  Filled 2013-11-30: qty 2

## 2013-11-30 MED ORDER — WARFARIN SODIUM 2 MG PO TABS
2.0000 mg | ORAL_TABLET | Freq: Once | ORAL | Status: AC
  Administered 2013-11-30: 2 mg via ORAL
  Filled 2013-11-30: qty 1

## 2013-11-30 NOTE — Progress Notes (Signed)
Patient able to maintained 94-98% room air saturation since this morning,pulse oximeter will be discontinue.

## 2013-11-30 NOTE — Progress Notes (Signed)
ANTICOAGULATION CONSULT NOTE   Pharmacy Consult for Warfarin Indication: atrial fibrillation  No Known Allergies  Patient Measurements: Height: 5\' 5"  (165.1 cm) Weight: 214 lb 3.2 oz (97.16 kg) IBW/kg (Calculated) : 57 Heparin Dosing Weight:   Vital Signs: Temp: 99 F (37.2 C) (08/08 0956) Temp src: Oral (08/08 0956) BP: 142/78 mmHg (08/08 0956) Pulse Rate: 84 (08/08 0956)  Labs:  Recent Labs  11/28/13 0356 11/29/13 0740 11/30/13 0420  HGB 9.4* 8.5* 8.1*  HCT 31.2* 28.2* 26.3*  PLT 175 158 176  LABPROT 38.6* 30.6* 25.6*  INR 3.95* 2.93* 2.33*  CREATININE 3.16* 2.27* 1.75*    Estimated Creatinine Clearance: 33 ml/min (by C-G formula based on Cr of 1.75).   Medical History: Past Medical History  Diagnosis Date  . NICM (nonischemic cardiomyopathy)     EF 35%; cath 2/12 no CAD  . Systolic CHF, chronic   . Severe mitral regurgitation     s/p complex mitral valve repair with cox maze + LAA clipping, removal of RV lead, and implantation of CRT-D in abdomen  . Cardiac arrest - ventricular fibrillation     in 1990s  . Atrial fibrillation     amiodarone  . HTN (hypertension)   . HLD (hyperlipidemia)   . Achalasia     s/p dilation  . Recurrent UTI   . Obesity   . Anoxic brain injury 1996    s/p cardiac arrest   . Automatic implantable cardioverter-defibrillator in situ   . Hypothyroidism   . TIA (transient ischemic attack)   . Chronic kidney disease (CKD), stage IV (severe)     [name] 10/03/2013    Assessment: Pt admitted for R calf wound and multiple days of diarrhea.  On warfarin PTA for AFib.   Admit INR was supratherapeutic but has trended down to to 2.33 after holding dose on 8/6.  Historically INRs have been within appropriate range.   Patient continues on levaquin which can influence INR, will continue to follow closely. If INR stabilizes and levaquin continues may need to adjust home regimen.  PTA dose: warfarin 2 mg po daily.  Goal of Therapy:   INR 2-3 Monitor platelets by anticoagulation protocol: Yes   Plan:  Warfarin 2 mg tonight Daily INR Monitor closely for s/sx bleeding  Vinnie Level, PharmD.  Clinical Pharmacist Pager [phone]

## 2013-11-30 NOTE — Progress Notes (Addendum)
Progress Note  Margaret [name]:[id number] DOB: 07/06/1940 DOA: 11/27/2013 PCP: Allean Found, MD Primary Cardiologist: Dr. Donnie Aho. EPS Cardiology: Dr. Graciela Husbands. Urology: Dr. Vernie Ammons.   Brief narrative: 73 y.o.F w/ Hx chronic systolic heart failure, hypothyroidism, chronic Foley catheter, chronic left lower extremity wound and stage IV chronic kidney disease. For 2-3 days she had continuous diarrhea. Patient reported extreme weakness and inability to get out of bed. In an attempt to mobilize out of bed on the date of presentation she fell on the floor and could not get up. When her son helped her sit upright, she became lightheaded. Her next recollection was of awakening in the ER. She had been told that she passed out.   In emergency room, patient was noted to have mild leukocytosis of 12.3 with an 83% shift. C. difficile PCR had been obtained and was pending. Patient's urinary catheter was noted to not be draining.  A new Foley catheter was placed after arrival to the ER and after that the patient started having more free-flowing urine. Her creatinine was elevated at 3.63 with a BUN of 51-baseline was 2.54. Patient had an anion gap of 20 lactic acid level of only 1.59. X-rays of her lower extremity wound were unremarkable. Surgery evaluated her leg no evidence of concern for abscess or necrotizing fasciitis.   HPI/Subjective: States that she feels much better than on admission. Denies pain. Denies dyspnea. Does not use home oxygen.  States that she has had a Foley for 6 months-follows with Dr. Vernie Ammons and gets weekly catheter change. History of frequent UTIs. Lives with spouse and mobile but wheelchair and walker.  Assessment/Plan:    Syncope -likely from [facility]    Dehydration -from acute illness in setting of preadmission use of Lasix and Zaroxolyn -cont IVFs but since BP improved and getting volume overloaded, DC IV fluids. Tolerating by mouth.    Sepsis secondary to Klebsiella  pneumoniae bacteremia from UTI - Initially treated empirically with vancomycin and Zosyn. Switched to levofloxacin on 8/6. Sensitive to quinolones. Continue levofloxacin and complete total 2 weeks treatment. -catheter changed in ER    Acute renal failure on Chronic kidney disease (CKD), stage IV (severe) -baseline 44/2.43. -peak 51/3.63 -cont to hold offending meds, promote normotensive state and rehydrate gently - Creatinine continues to improve. Monitor    Chronic systolic heart failure/DCM/mild pulmonary HTN -compensated -ECHO with moderate reduced systolic function with mod diffuse hypokinesis in 2013 - Seems to be getting volume overloaded/pedal edema. DC IV fluids.    Diarrhea -has not recurred since admisison     Atrial fibrillation -rate controlled -alt between AF and NSR    Hyperlipidemia    Hypothyroidism -TSH was 26.26 September 2013, TSH 41.3 this admit with normal T4 so continue home dose Synthroid -? Question accuracy of TSH given T4 normal - f/u as outpt when not acutely ill in 4-6 weeks     Transaminitis -noted at last admit but has resolved    Wound of right leg -has been evaluated by surgery and WOCRN with no new recs- no signs of infection  Hypokalemia - Replace and follow  Anemia - Secondary to acute illness complicating chronic disease. Follow CBCs and transfuse if hemoglobin less than 7.  DVT prophylaxis: Warfarin Code Status: DO NOT RESUSCITATE Family Communication: No family at bedside Disposition Plan/Expected LOS: DC home in next 1-2 days.  Consultants: None  Procedures: None  Antibiotics: Zosyn 8/5 >>> 8/6 Vancomycin 8/5 >>> 8/6 Levaquin 8/6 >>>  Objective:  Blood pressure 142/78, pulse 84, temperature 99 F (37.2 C), temperature source Oral, resp. rate 20, height 5\' 5"  (1.651 m), weight 97.16 kg (214 lb 3.2 oz), SpO2 96.00%.  Intake/Output Summary (Last 24 hours) at 11/30/13 1457 Last data filed at 11/30/13 0955  Gross per 24 hour    Intake 1659.58 ml  Output    600 ml  Net 1059.58 ml   Exam: Gen: No acute respiratory distress Chest: Clear to auscultation bilaterally without wheezes, rhonchi or crackles, RA Cardiac: Irregular rate and rhythm, systolic murmur, no rubs or gallops, no JVD. Trace bilateral ankle edema. Abdomen: Soft nontender nondistended without obvious hepatosplenomegaly, no ascites. Foley with clear straw-colored urine. Extremities: Symmetrical in appearance without cyanosis, clubbing or effusion. Symmetric 5 x 5 power. CNS: Alert and oriented. No focal deficits  Scheduled Meds:  Scheduled Meds: . fluticasone  1 spray Each Nare QHS  . folic acid  1 mg Oral Daily  . [START ON 12/02/2013] levofloxacin (LEVAQUIN) IV  750 mg Intravenous Q48H  . levothyroxine  50 mcg Oral QAC breakfast  . sodium chloride  3 mL Intravenous Q12H  . warfarin  2 mg Oral ONCE-1800  . Warfarin - Pharmacist Dosing Inpatient   Does not apply q1800   Data Reviewed: Basic Metabolic Panel:  Recent Labs Lab 11/27/13 1108 11/28/13 0356 11/29/13 0740 11/30/13 0420  NA 142 143 145 142  K 5.3 3.8 3.7 3.4*  CL 101 106 111 109  CO2 21 20 19 19   GLUCOSE 132* 95 88 88  BUN 51* 53* 38* 28*  CREATININE 3.63* 3.16* 2.27* 1.75*  CALCIUM 9.5 8.1* 7.6* 7.8*  MG  --   --  2.1  --   PHOS  --   --  3.1  --    Liver Function Tests:  Recent Labs Lab 11/27/13 1108 11/29/13 0740 11/30/13 0420  AST 21 16 15   ALT 11 7 8   ALKPHOS 103 67 67  BILITOT 1.5* 0.5 0.4  PROT 8.7* 6.4 6.1  ALBUMIN 3.4* 2.4* 2.2*   CBC:  Recent Labs Lab 11/27/13 1108 11/28/13 0356 11/29/13 0740 11/30/13 0420  WBC 12.3* 6.4 3.3* 3.8*  NEUTROABS 10.9*  --   --   --   HGB 11.1* 9.4* 8.5* 8.1*  HCT 37.1 31.2* 28.2* 26.3*  MCV 98.9 99.7 96.6 96.0  PLT 260 175 158 176   Cardiac Enzymes:  Recent Labs Lab 11/27/13 1108  TROPONINI <0.30   BNP (last 3 results)  Recent Labs  03/02/13 2245 03/30/13 1815  PROBNP 1436.0* 1353.0*    Recent  Results (from the past 240 hour(s))  URINE CULTURE     Status: None   Collection Time    11/27/13 11:40 AM      Result Value Ref Range Status   Specimen Description URINE, CATHETERIZED   Final   Special Requests NONE   Final   Culture  Setup Time     Final   Value: 11/28/2013 15:02     Performed at [facility] Count     Final   Value: >=100,000 COLONIES/ML     Performed at [facility]   Culture     Final   Value: KLEBSIELLA PNEUMONIAE     Performed at [facility]   Report Status 11/30/2013 FINAL   Final   Organism ID, Bacteria KLEBSIELLA PNEUMONIAE   Final  CULTURE, BLOOD (ROUTINE X 2)     Status: None   Collection Time  11/27/13  3:30 PM      Result Value Ref Range Status   Specimen Description BLOOD RIGHT ARM   Final   Special Requests BOTTLES DRAWN AEROBIC ONLY [facility]CC   Final   Culture  Setup Time     Final   Value: 11/27/2013 19:00     [facility]formed at [facility]   Culture     Final   Value: KLEBSIELLA PNEUMONIAE     Note: Gram Stain Report Called to,Read Back By and Verified With: THERESA O'LEARY@0923  ON [id number] BY [facility]     Performed at [facility]   Report Status 11/30/2013 FINAL   Final   Organism ID, Bacteria KLEBSIELLA PNEUMONIAE   Final  CULTURE, BLOOD (ROUTINE X 2)     Status: None   Collection Time    11/27/13  3:35 PM      Result Value Ref Range Status   Specimen Description BLOOD RIGHT HAND   Final   Special Requests BOTTLES DRAWN AEROBIC ONLY 5CC   Final   Culture  Setup Time     Final   Value: 11/27/2013 19:01     Performed at [facility]   Culture     Final   Value:        BLOOD CULTURE RECEIVED NO GROWTH TO DATE CULTURE WILL BE HELD FOR 5 DAYS BEFORE ISSUING A FINAL NEGATIVE REPORT     Performed at [facility]   Report Status PENDING   Incomplete  MRSA PCR SCREENING     Status: None   Collection Time    11/27/13  6:37 PM      Result Value Ref Range Status   MRSA by PCR NEGATIVE   NEGATIVE Final   Comment:            The GeneXpert MRSA Assay (FDA     approved for NASAL specimens     only), is one component of a     comprehensive MRSA colonization     surveillance program. It is not     intended to diagnose MRSA     infection nor to guide or     monitor treatment for     MRSA infections.     Studies:  Recent x-ray studies have been reviewed in detail by the Attending Physician  Time spent : 35 mins    LOS: 3 days   Yasuko Lapage, MD, FACP, FHM. [facility] Pager [phone]  If 7PM-7AM, please contact night-coverage [email or web address] Password [email or web address] 11/30/2013, 3:07 PM

## 2013-12-01 LAB — BASIC METABOLIC PANEL
Anion gap: 12 (ref 5–15)
BUN: 23 mg/dL (ref 6–23)
CHLORIDE: 109 meq/L (ref 96–112)
CO2: 20 mEq/L (ref 19–32)
Calcium: 8.2 mg/dL — ABNORMAL LOW (ref 8.4–10.5)
Creatinine, Ser: 1.47 mg/dL — ABNORMAL HIGH (ref 0.50–1.10)
GFR, EST AFRICAN AMERICAN: 40 mL/min — AB (ref >90)
GFR, EST NON AFRICAN AMERICAN: 34 mL/min — AB (ref >90)
Glucose, Bld: 98 mg/dL (ref 70–99)
POTASSIUM: 3.9 meq/L (ref 3.7–5.3)
Sodium: 141 mEq/L (ref 137–147)

## 2013-12-01 LAB — CBC
HCT: 28.1 % — ABNORMAL LOW (ref 36.0–46.0)
Hemoglobin: 8.5 g/dL — ABNORMAL LOW (ref 12.0–15.0)
MCH: 29.6 pg (ref 26.0–34.0)
MCHC: 30.2 g/dL (ref 30.0–36.0)
MCV: 97.9 fL (ref 78.0–100.0)
Platelets: 180 10*3/uL (ref 150–400)
RBC: 2.87 MIL/uL — ABNORMAL LOW (ref 3.87–5.11)
RDW: 15.9 % — AB (ref 11.5–15.5)
WBC: 3.9 10*3/uL — AB (ref 4.0–10.5)

## 2013-12-01 LAB — PROTIME-INR
INR: 1.91 — AB (ref 0.00–1.49)
PROTHROMBIN TIME: 21.9 s — AB (ref 11.6–15.2)

## 2013-12-01 MED ORDER — WARFARIN SODIUM 2 MG PO TABS
2.0000 mg | ORAL_TABLET | Freq: Once | ORAL | Status: AC
  Administered 2013-12-01: 2 mg via ORAL
  Filled 2013-12-01: qty 1

## 2013-12-01 NOTE — Progress Notes (Addendum)
Progress Note  Margaret [name] XNT:[id number] DOB: Mar 21, 1941 DOA: 11/27/2013 PCP: Allean Found, MD Primary Cardiologist: Dr. Donnie Aho. EPS Cardiology: Dr. Graciela Husbands. Urology: Dr. Vernie Ammons.   Brief narrative: 73 y.o.F w/ Hx chronic systolic heart failure, hypothyroidism, chronic Foley catheter, chronic left lower extremity wound and stage IV chronic kidney disease. For 2-3 days she had continuous diarrhea. Patient reported extreme weakness and inability to get out of bed. In an attempt to mobilize out of bed on the date of presentation she fell on the floor and could not get up. When her son helped her sit upright, she became lightheaded. Her next recollection was of awakening in the ER. She had been told that she passed out.   In emergency room, patient was noted to have mild leukocytosis of 12.3 with an 83% shift. C. difficile PCR had been obtained and was pending. Patient's urinary catheter was noted to not be draining.  A new Foley catheter was placed after arrival to the ER and after that the patient started having more free-flowing urine. Her creatinine was elevated at 3.63 with a BUN of 51-baseline was 2.54. Patient had an anion gap of 20 lactic acid level of only 1.59. X-rays of her lower extremity wound were unremarkable. Surgery evaluated her leg no evidence of concern for abscess or necrotizing fasciitis.   HPI/Subjective: Denies complaints.  States that she has had a Foley for 6 months-follows with Dr. Vernie Ammons and gets weekly catheter change. History of frequent UTIs. Lives with spouse and mobile but wheelchair and walker.  Assessment/Plan:    Syncope -likely from [facility]    Dehydration -from acute illness in setting of preadmission use of Lasix and Zaroxolyn -cont IVFs but since BP improved and getting volume overloaded, DC'ed IV fluids. Tolerating by mouth.    Sepsis secondary to Klebsiella pneumoniae bacteremia from UTI - Initially treated empirically with vancomycin and  Zosyn. Switched to levofloxacin on 8/6. Sensitive to quinolones. Continue levofloxacin and complete total 2 weeks treatment. -catheter changed in ER    Acute renal failure on Chronic kidney disease (CKD), stage IV (severe) -baseline 44/2.43. -peak 51/3.63 -cont to hold offending meds, promote normotensive state and rehydrate gently - Creatinine continues to improve. Monitor    Chronic systolic heart failure/DCM/mild pulmonary HTN -compensated -ECHO with moderate reduced systolic function with mod diffuse hypokinesis in 2013 - Seems to be getting volume overloaded/pedal edema. DC'ed IV fluids.    Diarrhea -has not recurred since admisison     Atrial fibrillation -rate controlled -alt between AF and NSR    Hyperlipidemia    Hypothyroidism -TSH was 26.26 September 2013, TSH 41.3 this admit with normal T4 so continue home dose Synthroid -? Question accuracy of TSH given T4 normal - f/u as outpt when not acutely ill in 4-6 weeks     Transaminitis -noted at last admit but has resolved    Wound of right leg -has been evaluated by surgery and WOCRN with no new recs- no signs of infection  Hypokalemia - Replace and follow  Anemia - Secondary to acute illness complicating chronic disease. Follow CBCs and transfuse if hemoglobin less than 7.  DVT prophylaxis: Warfarin Code Status: DO NOT RESUSCITATE Family Communication: No family at bedside Disposition Plan/Expected LOS: DC home possibly 8/9  Consultants: None  Procedures: None  Antibiotics: Zosyn 8/5 >>> 8/6 Vancomycin 8/5 >>> 8/6 Levaquin 8/6 >>>  Objective: Blood pressure 145/75, pulse 77, temperature 97.5 F (36.4 C), temperature source Oral, resp. rate 18, height 5'  5" (1.651 m), weight 98.567 kg (217 lb 4.8 oz), SpO2 96.00%.  Intake/Output Summary (Last 24 hours) at 12/01/13 1412 Last data filed at 12/01/13 1330  Gross per 24 hour  Intake    480 ml  Output   1650 ml  Net  -1170 ml   Exam: Gen: No acute  respiratory distress Chest: Clear to auscultation bilaterally without wheezes, rhonchi or crackles, RA Cardiac: Irregular rate and rhythm, systolic murmur, no rubs or gallops, no JVD. Trace bilateral ankle edema. Abdomen: Soft nontender nondistended without obvious hepatosplenomegaly, no ascites. Foley with clear straw-colored urine. Extremities: Symmetrical in appearance without cyanosis, clubbing or effusion. Symmetric 5 x 5 power. CNS: Alert and oriented. No focal deficits  Scheduled Meds:  Scheduled Meds: . fluticasone  1 spray Each Nare QHS  . folic acid  1 mg Oral Daily  . [START ON 12/02/2013] levofloxacin (LEVAQUIN) IV  750 mg Intravenous Q48H  . levothyroxine  50 mcg Oral QAC breakfast  . sodium chloride  3 mL Intravenous Q12H  . warfarin  2 mg Oral ONCE-1800  . Warfarin - Pharmacist Dosing Inpatient   Does not apply q1800   Data Reviewed: Basic Metabolic Panel:  Recent Labs Lab 11/27/13 1108 11/28/13 0356 11/29/13 0740 11/30/13 0420 12/01/13 0344  NA 142 143 145 142 141  K 5.3 3.8 3.7 3.4* 3.9  CL 101 106 111 109 109  CO2 21 20 19 19 20   GLUCOSE 132* 95 88 88 98  BUN 51* 53* 38* 28* 23  CREATININE 3.63* 3.16* 2.27* 1.75* 1.47*  CALCIUM 9.5 8.1* 7.6* 7.8* 8.2*  MG  --   --  2.1  --   --   PHOS  --   --  3.1  --   --    Liver Function Tests:  Recent Labs Lab 11/27/13 1108 11/29/13 0740 11/30/13 0420  AST 21 16 15   ALT 11 7 8   ALKPHOS 103 67 67  BILITOT 1.5* 0.5 0.4  PROT 8.7* 6.4 6.1  ALBUMIN 3.4* 2.4* 2.2*   CBC:  Recent Labs Lab 11/27/13 1108 11/28/13 0356 11/29/13 0740 11/30/13 0420 12/01/13 0344  WBC 12.3* 6.4 3.3* 3.8* 3.9*  NEUTROABS 10.9*  --   --   --   --   HGB 11.1* 9.4* 8.5* 8.1* 8.5*  HCT 37.1 31.2* 28.2* 26.3* 28.1*  MCV 98.9 99.7 96.6 96.0 97.9  PLT 260 175 158 176 180   Cardiac Enzymes:  Recent Labs Lab 11/27/13 1108  TROPONINI <0.30   BNP (last 3 results)  Recent Labs  03/02/13 2245 03/30/13 1815  PROBNP 1436.0*  1353.0*    Recent Results (from the past 240 hour(s))  URINE CULTURE     Status: None   Collection Time    11/27/13 11:40 AM      Result Value Ref Range Status   Specimen Description URINE, CATHETERIZED   Final   Special Requests NONE   Final   Culture  Setup Time     Final   Value: 11/28/2013 15:02     Performed at [facility]   [place] Count     Final   Value: >=100,000 COLONIES/ML     [facility]formed at [facility]   Culture     Final   Value: KLEBSIELLA PNEUMONIAE     Performed at [facility]   Report Status 11/30/2013 FINAL   Final   Organism ID, Bacteria KLEBSIELLA PNEUMONIAE   Final  CULTURE, BLOOD (ROUTINE X 2)  Status: None   Collection Time    11/27/13  3:30 PM      Result Value Ref Range Status   Specimen Description BLOOD RIGHT ARM   Final   Special Requests BOTTLES DRAWN AEROBIC ONLY [facility]   Final   Culture  Setup Time     Final   Value: 11/27/2013 19:00     Performed at [facility]   Culture     Final   Value: KLEBSIELLA PNEUMONIAE     Note: Gram Stain Report Called to,Read Back By and Verified With: THERESA O'LEARY@0923  ON [id number] BY [facility]     Performed at [facility]   Report Status 11/30/2013 FINAL   Final   Organism ID, Bacteria KLEBSIELLA PNEUMONIAE   Final  CULTURE, BLOOD (ROUTINE X 2)     Status: None   Collection Time    11/27/13  3:35 PM      Result Value Ref Range Status   Specimen Description BLOOD RIGHT HAND   Final   Special Requests BOTTLES DRAWN AEROBIC ONLY 5CC   Final   Culture  Setup Time     Final   Value: 11/27/2013 19:01     Performed at [facility]   Culture     Final   Value:        BLOOD CULTURE RECEIVED NO GROWTH TO DATE CULTURE WILL BE HELD FOR 5 DAYS BEFORE ISSUING A FINAL NEGATIVE REPORT     Performed at [facility]   Report Status PENDING   Incomplete  MRSA PCR SCREENING     Status: None   Collection Time    11/27/13  6:37 PM      Result Value Ref Range Status   MRSA  by PCR NEGATIVE  NEGATIVE Final   Comment:            The GeneXpert MRSA Assay (FDA     approved for NASAL specimens     only), is one component of a     comprehensive MRSA colonization     surveillance program. It is not     intended to diagnose MRSA     infection nor to guide or     monitor treatment for     MRSA infections.     Studies:  Recent x-ray studies have been reviewed in detail by the Attending Physician  Time spent : 35 mins    LOS: 4 days   Sonam Wandel, MD, FACP, FHM. [facility] Pager [phone]  If 7PM-7AM, please contact night-coverage [email or web address] Password [email or web address] 12/01/2013, 2:12 PM

## 2013-12-01 NOTE — Progress Notes (Signed)
Skin checked under the sacral prophylactic foam dressing,noticed the redness areas on sacral area extending on both gluteals folds of her buttocks,skin is intact ,red and blanchable.On the rt lower lateral buttocks,there are three small stage areas,measuring  2 x 2 circular shape,1,5 x 3 irregular shape and 1 X 1 circular shape,all were red ,non blanchable with slight skin excoriation.Foam dressing applied.Nurse made aware.

## 2013-12-01 NOTE — Progress Notes (Signed)
ANTICOAGULATION CONSULT NOTE   Pharmacy Consult for Warfarin Indication: atrial fibrillation  No Known Allergies  Patient Measurements: Height: 5\' 5"  (165.1 cm) Weight: 217 lb 4.8 oz (98.567 kg) IBW/kg (Calculated) : 57 Heparin Dosing Weight:   Vital Signs: Temp: 97.5 F (36.4 C) (08/09 0946) Temp src: Oral (08/09 0946) BP: 145/75 mmHg (08/09 0946) Pulse Rate: 77 (08/09 0946)  Labs:  Recent Labs  11/29/13 0740 11/30/13 0420 12/01/13 0344  HGB 8.5* 8.1* 8.5*  HCT 28.2* 26.3* 28.1*  PLT 158 176 180  LABPROT 30.6* 25.6* 21.9*  INR 2.93* 2.33* 1.91*  CREATININE 2.27* 1.75* 1.47*    Estimated Creatinine Clearance: 39.6 ml/min (by C-G formula based on Cr of 1.47).   Medical History: Past Medical History  Diagnosis Date  . NICM (nonischemic cardiomyopathy)     EF 35%; cath 2/12 no CAD  . Systolic CHF, chronic   . Severe mitral regurgitation     s/p complex mitral valve repair with cox maze + LAA clipping, removal of RV lead, and implantation of CRT-D in abdomen  . Cardiac arrest - ventricular fibrillation     in 1990s  . Atrial fibrillation     amiodarone  . HTN (hypertension)   . HLD (hyperlipidemia)   . Achalasia     s/p dilation  . Recurrent UTI   . Obesity   . Anoxic brain injury 1996    s/p cardiac arrest   . Automatic implantable cardioverter-defibrillator in situ   . Hypothyroidism   . TIA (transient ischemic attack)   . Chronic kidney disease (CKD), stage IV (severe)     [name] 10/03/2013    Assessment: Pt admitted for R calf wound and multiple days of diarrhea.  On warfarin PTA for AFib.   Admit INR was supratherapeutic but has trended down to to 1.91 after holding dose on 8/6.  Historically INRs have been within appropriate range. H/H low but stable. Plt stable   Patient continues on levaquin which can influence INR, will continue to follow closely. If INR stabilizes and levaquin continues may need to adjust home regimen.  PTA dose: warfarin 2  mg po daily.  Goal of Therapy:  INR 2-3 Monitor platelets by anticoagulation protocol: Yes   Plan:  Repeat Warfarin 2 mg tonight Daily INR Monitor closely for s/sx bleeding  Vinnie Level, PharmD.  Clinical Pharmacist Pager [phone]

## 2013-12-02 ENCOUNTER — Encounter: Payer: Self-pay | Admitting: Cardiology

## 2013-12-02 LAB — BASIC METABOLIC PANEL
ANION GAP: 13 (ref 5–15)
BUN: 19 mg/dL (ref 6–23)
CALCIUM: 8.5 mg/dL (ref 8.4–10.5)
CO2: 21 mEq/L (ref 19–32)
Chloride: 108 mEq/L (ref 96–112)
Creatinine, Ser: 1.35 mg/dL — ABNORMAL HIGH (ref 0.50–1.10)
GFR calc non Af Amer: 38 mL/min — ABNORMAL LOW (ref >90)
GFR, EST AFRICAN AMERICAN: 44 mL/min — AB (ref >90)
Glucose, Bld: 99 mg/dL (ref 70–99)
Potassium: 4.2 mEq/L (ref 3.7–5.3)
SODIUM: 142 meq/L (ref 137–147)

## 2013-12-02 LAB — PROTIME-INR
INR: 1.79 — AB (ref 0.00–1.49)
Prothrombin Time: 20.8 seconds — ABNORMAL HIGH (ref 11.6–15.2)

## 2013-12-02 MED ORDER — LEVOFLOXACIN 750 MG PO TABS: 750.0000 mg | ORAL_TABLET | ORAL | Status: AC

## 2013-12-02 MED ORDER — LEVOFLOXACIN 750 MG PO TABS
750.0000 mg | ORAL_TABLET | ORAL | Status: DC
  Administered 2013-12-02: 750 mg via ORAL
  Filled 2013-12-02: qty 1

## 2013-12-02 MED ORDER — WARFARIN SODIUM 2 MG PO TABS
2.0000 mg | ORAL_TABLET | Freq: Once | ORAL | Status: DC
  Filled 2013-12-02: qty 1

## 2013-12-02 NOTE — Progress Notes (Signed)
Occupational Therapy Treatment Patient Details Name: Margaret [name] MRN: [id number] DOB: 01/14/1941 Today's Date: 12/02/2013    History of present illness 73 y.o. female admitted with 2-3 days of continuous diarrhea and reports of extreme weakness and inability to get OOB which resulted in a fall at home.  Dx Sepsis likely from UTI; ARF on CKD stage IV.  PMH includes:  chronic systolic heart failure, hypothyroidism, chronic Foley catheter, chronic left lower extremity wound and stage IV chronic kidney disease   OT comments  Pt requiring encouragement to participate in OT.  Prefers to rely on her daughter for bathing and dressing.  Making slow gains in ADL transfers and bed mobility.  Daughter arrived at end of session and voices no concerns about taking pt home.  Plan is for home today per RN.  Follow Up Recommendations  Home health OT;Supervision/Assistance - 24 hour    Equipment Recommendations  None recommended by OT    Recommendations for Other Services      Precautions / Restrictions Precautions Precautions: Fall Restrictions Weight Bearing Restrictions: No       Mobility Bed Mobility   Bed Mobility: Rolling;Sidelying to Sit;Sit to Sidelying Rolling: Mod assist Sidelying to sit: HOB elevated;Max assist     Sit to sidelying: Max assist;HOB elevated General bed mobility comments: used gravity assistance for scooting up in bed, pt able to do with min guard assist.  Transfers     Transfers: Stand Pivot Transfers Sit to Stand: Mod assist Stand pivot transfers: Mod assist            Balance                                   ADL Overall ADL's : Needs assistance/impaired     Grooming: Wash/dry hands;Wash/dry face;Brushing hair;Sitting;Set up                   Toilet Transfer: Moderate assistance;Stand-pivot;BSC   Toileting- Clothing Manipulation and Hygiene: Total assistance;Sit to/from stand         General ADL Comments: Pt declined  bathing and dressing stating she is going home this afternoon and expected that her daughter would help her.      Vision                     Perception     Praxis      Cognition   Behavior During Therapy: WFL for tasks assessed/performed Overall Cognitive Status: History of cognitive impairments - at baseline       Memory: Decreased short-term memory               Extremity/Trunk Assessment               Exercises     Shoulder Instructions       General Comments      Pertinent Vitals/ Pain          Home Living                                          Prior Functioning/Environment              Frequency Min 2X/week     Progress Toward Goals  OT Goals(current goals can now be found in the care plan section)  Progress  towards OT goals: Progressing toward goals  Acute Rehab OT Goals Patient Stated Goal: to go home  Time For Goal Achievement: 12/13/13 Potential to Achieve Goals: Good  Plan Discharge plan needs to be updated    Co-evaluation                 End of Session     Activity Tolerance Patient limited by fatigue   Patient Left in bed;in CPM;with family/visitor present;with bed alarm set   Nurse Communication  (now on contact precautions)        Time: [id number] OT Time Calculation (min): 21 min  Charges: OT General Charges $OT Visit: 1 Procedure OT Treatments $Self Care/Home Management : 8-22 mins  Evern [employer], Maily Debarge Lynn 12/02/2013, 1:11 PM [phone]

## 2013-12-02 NOTE — Progress Notes (Signed)
Discharge documentation reviewed with pt and daughter; allowing time for questions. Pt and daughter verbalized understanding. IV removed without issues. Pt to leave unit via wheelchair.

## 2013-12-02 NOTE — Progress Notes (Signed)
ANTICOAGULATION CONSULT NOTE - Follow Up Consult  Pharmacy Consult for Coumadin Indication: atrial fibrillation  No Known Allergies  Patient Measurements: Height: 5\' 5"  (165.1 cm) Weight: 220 lb 4.8 oz (99.927 kg) IBW/kg (Calculated) : 57  Vital Signs: Temp: 97.9 F (36.6 C) (08/10 0425) Temp src: Oral (08/10 0425) BP: 139/72 mmHg (08/10 0425) Pulse Rate: 78 (08/10 0425)  Labs:  Recent Labs  11/30/13 0420 12/01/13 0344 12/02/13 0440 12/02/13 0844  HGB 8.1* 8.5*  --   --   HCT 26.3* 28.1*  --   --   PLT 176 180  --   --   LABPROT 25.6* 21.9* 20.8*  --   INR 2.33* 1.91* 1.79*  --   CREATININE 1.75* 1.47*  --  1.35*    Estimated Creatinine Clearance: 43.5 ml/min (by C-G formula based on Cr of 1.35).  Assessment: Pt is 73yo female on coumadin PTA for afib. INR trending down to 1.79, but dose was initially reduced and held 8/5-8/7. Now that patient is on levaquin and home dose of coumadin, will expect INR to trend up.   Goal of Therapy:  INR 2-3 Monitor platelets by anticoagulation protocol: Yes   Plan:  1. Continue coumadin 2mg  po today 2. Will monitor INR closely for DDIs w/ levaquin.  Delle Reining M 12/02/2013,10:20 AM

## 2013-12-02 NOTE — Progress Notes (Signed)
PT Cancellation Note  Patient Details Name: Margaret [name] MRN: [id number] DOB: Mar 05, 1941   Cancelled Treatment:    Reason Eval/Treat Not Completed: Patient declined, no reason specified.  Patient reports she is going home today and will have HHPT f/u.   Vena Austria 12/02/2013, 12:08 PM Durenda Hurt Renaldo Fiddler, [facility] Acute Rehab Services Pager [phone]

## 2013-12-02 NOTE — Progress Notes (Signed)
While pt was getting dressed she called front desk because she became SOB and stated she could not catch her breath. Went in to assess pt and pt was lying across bed and did not appear to be in distress. O2 saturation at 89%; came up to 93-94% within 1 min. Pt states she felt wheezy; Clinical research associate and another nurse heard diminished lung sounds during auscultation, no wheezing heard. Writer paged Dr. Waymon Amato to make him aware; no further orders/instructions given. Pt left unit via wheelchair accompanied by writer and CN.

## 2013-12-02 NOTE — Discharge Summary (Addendum)
Physician Discharge Summary  Margaret [name] ZOX:[id number] DOB: November 25, 1940 DOA: 11/27/2013  PCP: Allean Found, MD  Admit date: 11/27/2013 Discharge date: 12/02/2013  Time spent: Greater than 30 minutes  Recommendations for Outpatient Follow-up:  1. Dr. Merri Brunette, PCP in 1 week. 2. Dr. Ellwood Handler, Cardiology in 2 days with repeat labs (CBC, BMP, PT & INR). Will need Coumadin dose adjustment and resumption of diuretics at some point. 3. Dr. Ihor Gully, Urology: Followup regarding recurrent urinary tract infections 4. Wound care center: In 1 week for followup and management of right leg wound. 5. Home health PT, OT and RN. 6. Recommend TSH followup in 4-6 weeks.  Discharge Diagnoses:  Active Problems:   Atrial fibrillation   Chronic kidney disease (CKD), stage IV (severe)   Hyperlipidemia   Chronic systolic heart failure   Hypothyroidism   Syncope   Wound of left leg   Diarrhea   Dehydration   Sepsis(995.91)   Bacteremia   Acute renal failure   Abnormal urinalysis   Discharge Condition: Improved & Stable  Diet recommendation: Heart healthy diet.  Filed Weights   11/29/13 2111 11/30/13 2039 12/01/13 2024  Weight: 97.16 kg (214 lb 3.2 oz) 98.567 kg (217 lb 4.8 oz) 99.927 kg (220 lb 4.8 oz)    History of present illness:  73 y.o.F w/ Hx chronic systolic heart failure, hypothyroidism, chronic Foley catheter, chronic left lower extremity wound and stage IV chronic kidney disease. For 2-3 days she had continuous diarrhea. Patient reported extreme weakness and inability to get out of bed. In an attempt to mobilize out of bed on the date of presentation she fell on the floor and could not get up. When her son helped her sit upright, she became lightheaded. Her next recollection was of awakening in the ER. She had been told that she passed out.  In emergency room, patient was noted to have mild leukocytosis of 12.3 with an 83% shift. C. difficile PCR had been obtained and  was pending. Patient's urinary catheter was noted to not be draining. A new Foley catheter was placed after arrival to the ER and after that the patient started having more free-flowing urine. Her creatinine was elevated at 3.63 with a BUN of 51-baseline was 2.54. Patient had an anion gap of 20 lactic acid level of only 1.59. X-rays of her lower extremity wound were unremarkable. Surgery evaluated her leg no evidence of concern for abscess or necrotizing fasciitis.  Hospital Course:   Syncope  -likely from [facility]  Dehydration  -from acute illness in setting of preadmission use of Lasix and Zaroxolyn  -Treated with IVFs but since BP improved and getting volume overloaded, DC'ed IV fluids. Tolerating by mouth.  Sepsis secondary to Klebsiella pneumoniae bacteremia from UTI  - Initially treated empirically with vancomycin and Zosyn. Switched to levofloxacin on 8/6. Sensitive to quinolones. Continue levofloxacin and complete total 2 weeks treatment on 12/11/13.  -catheter changed in ER  Acute renal failure on Chronic kidney disease (CKD), stage IV (severe)  -baseline 44/2.43. -peak 51/3.63  -cont to hold offending meds, promote normotensive state and rehydrate gently  - Creatinine continues to improve. Monitor  - Continue to hold diuretics until outpatient followup. Chronic systolic heart failure/DCM/mild pulmonary HTN  -compensated  -ECHO with moderate reduced systolic function with mod diffuse hypokinesis in 2013  - Seems to be getting volume overloaded/pedal edema. DC'ed IV fluids.  Diarrhea  -has not recurred since admisison  Atrial fibrillation  -rate controlled  -alt between  AF and NSR  - Beta blockers which had been held will be resumed at discharge. - Continue home dose of Coumadin especially in the context of continued levofloxacin and closely follow outpatient for dose adjustment. Hyperlipidemia  Hypothyroidism  -TSH was 26.26 September 2013, TSH 41.3 this admit with normal T4 so continue  home dose Synthroid  -? Question accuracy of TSH given T4 normal - f/u as outpt when not acutely ill in 4-6 weeks  Transaminitis  -noted at last admit but has resolved  Wound of right leg  -has been evaluated by surgery and WOCRN with no new recs- no signs of infection. Outpatient followup with wound care Center.  Hypokalemia  - Replaced Anemia  - Secondary to acute illness complicating chronic disease. Stable.   Consultations:  Wound care team  General surgery  Procedures:  None    Discharge Exam:  Complaints:  Patient denies complaints and anxious to go home. No dyspnea.  Filed Vitals:   12/01/13 0946 12/01/13 1653 12/01/13 2024 12/02/13 0425  BP: 145/75 137/68 137/83 139/72  Pulse: 77 71 69 78  Temp: 97.5 F (36.4 C) 97.7 F (36.5 C) 99.2 F (37.3 C) 97.9 F (36.6 C)  TempSrc: Oral Oral Oral Oral  Resp: 18 16 17 19   Height:      Weight:   99.927 kg (220 lb 4.8 oz)   SpO2: 96% 94% 98% 95%    Gen: No acute respiratory distress  Chest: Clear to auscultation bilaterally without wheezes, rhonchi or crackles, RA  Cardiac: Irregular rate and rhythm, systolic murmur, no rubs or gallops, no JVD. Trace bilateral ankle edema.  Abdomen: Soft nontender nondistended without obvious hepatosplenomegaly, no ascites. Foley with clear straw-colored urine.  Extremities: Symmetrical in appearance without cyanosis, clubbing or effusion. Symmetric 5 x 5 power. Dressing over right leg-clean, dry and intact. CNS: Alert and oriented. No focal deficits   Discharge Instructions      Discharge Instructions   (HEART FAILURE PATIENTS) Call MD:  Anytime you have any of the following symptoms: 1) 3 pound weight gain in 24 hours or 5 pounds in 1 week 2) shortness of breath, with or without a dry hacking cough 3) swelling in the hands, feet or stomach 4) if you have to sleep on extra pillows at night in order to breathe.    Complete by:  As directed      Call MD for:  extreme fatigue     Complete by:  As directed      Call MD for:  persistant dizziness or light-headedness    Complete by:  As directed      Call MD for:  temperature >100.4    Complete by:  As directed      Diet - low sodium heart healthy    Complete by:  As directed      Increase activity slowly    Complete by:  As directed             Medication List    STOP taking these medications       furosemide 80 MG tablet  Commonly known as:  LASIX     metolazone 2.5 MG tablet  Commonly known as:  ZAROXOLYN     potassium chloride SA 20 MEQ tablet  Commonly known as:  K-DUR,KLOR-CON      TAKE these medications       desloratadine 5 MG tablet  Commonly known as:  CLARINEX  Take 5 mg by mouth every morning.  fluticasone 50 MCG/ACT nasal spray  Commonly known as:  FLONASE  Place 1 spray into both nostrils at bedtime.     folic acid 1 MG tablet  Commonly known as:  FOLVITE  Take 1 tablet by mouth daily.     levofloxacin 750 MG tablet  Commonly known as:  LEVAQUIN  Take 1 tablet (750 mg total) by mouth every other day.  Start taking on:  12/04/2013     levothyroxine 50 MCG tablet  Commonly known as:  SYNTHROID, LEVOTHROID  Take 50 mcg by mouth daily before breakfast.     metoprolol tartrate 25 MG tablet  Commonly known as:  LOPRESSOR  Take 12.5 mg by mouth 2 (two) times daily.     warfarin 1 MG tablet  Commonly known as:  COUMADIN  Take 2 mg by mouth at bedtime.       Follow-up Information   Follow up with Allean Found, MD. Schedule an appointment as soon as possible for a visit in 1 week.   Specialty:  Family Medicine   Contact information:   [address], [address] [place] [address] [phone]       Schedule an appointment as soon as possible for a visit with Garnett Farm, MD. (Recurrent UTI follow up)    Specialty:  Urology   Contact information:   [address] [place] [address] [phone]       Follow up with TILLEY JR,W SPENCER, MD.  Schedule an appointment as soon as possible for a visit in 2 days. (To be seen with repeat labs (CBC, BMP, PT & INR). Follow up Coumadin dose adjustment & resuming diuretics at some point.)    Specialty:  Cardiology   Contact information:   [address] [place] [address] [phone]        The results of significant diagnostics from this hospitalization (including imaging, microbiology, ancillary and laboratory) are listed below for reference.    Significant Diagnostic Studies: Dg Tibia/fibula Right  11/27/2013   CLINICAL DATA:  Pain.  EXAM: RIGHT TIBIA AND FIBULA - 2 VIEW  COMPARISON:  None.  FINDINGS: a soft tissue wound noted over the right medial posterior calf. No radiopaque foreign bodies. Vascular calcification noted consistent peripheral vascular disease. No acute bony abnormality identified. No evidence of fracture or dislocation. No lytic lesion.  IMPRESSION: 1. Soft tissue wound. 2. Peripheral vascular disease. 3. No acute bony abnormality.   Electronically Signed   By: Maisie Fus  Register   On: 11/27/2013 16:28    Microbiology: Recent Results (from the past 240 hour(s))  URINE CULTURE     Status: None   Collection Time    11/27/13 11:40 AM      Result Value Ref Range Status   Specimen Description URINE, CATHETERIZED   Final   Special Requests NONE   Final   Culture  Setup Time     Final   Value: 11/28/2013 15:02     Performed at [facility] Count     Final   Value: >=100,000 COLONIES/ML     Performed at [facility]   Culture     Final   Value: KLEBSIELLA PNEUMONIAE     Performed at [facility]   Report Status 11/30/2013 FINAL   Final   Organism ID, Bacteria KLEBSIELLA PNEUMONIAE   Final  CULTURE, BLOOD (ROUTINE X 2)     Status: None   Collection Time  11/27/13  3:30 PM      Result Value Ref Range Status   Specimen Description BLOOD RIGHT ARM   Final   Special Requests BOTTLES DRAWN AEROBIC ONLY [facility]   Final    Culture  Setup Time     Final   Value: 11/27/2013 19:00     Performed at [facility]   Culture     Final   Value: KLEBSIELLA PNEUMONIAE     Note: Gram Stain Report Called to,Read Back By and Verified With: THERESA O'LEARY@0923  ON [id number] BY [facility]     Performed at [facility]   Report Status 11/30/2013 FINAL   Final   Organism ID, Bacteria KLEBSIELLA PNEUMONIAE   Final  CULTURE, BLOOD (ROUTINE X 2)     Status: None   Collection Time    11/27/13  3:35 PM      Result Value Ref Range Status   Specimen Description BLOOD RIGHT HAND   Final   Special Requests BOTTLES DRAWN AEROBIC ONLY 5CC   Final   Culture  Setup Time     Final   Value: 11/27/2013 19:01     Performed at [facility]   Culture     Final   Value:        BLOOD CULTURE RECEIVED NO GROWTH TO DATE CULTURE WILL BE HELD FOR 5 DAYS BEFORE ISSUING A FINAL NEGATIVE REPORT     Performed at [facility]   Report Status PENDING   Incomplete  MRSA PCR SCREENING     Status: None   Collection Time    11/27/13  6:37 PM      Result Value Ref Range Status   MRSA by PCR NEGATIVE  NEGATIVE Final   Comment:            The GeneXpert MRSA Assay (FDA     approved for NASAL specimens     only), is one component of a     comprehensive MRSA colonization     surveillance program. It is not     intended to diagnose MRSA     infection nor to guide or     monitor treatment for     MRSA infections.     Labs: Basic Metabolic Panel:  Recent Labs Lab 11/28/13 0356 11/29/13 0740 11/30/13 0420 12/01/13 0344 12/02/13 0844  NA 143 145 142 141 142  K 3.8 3.7 3.4* 3.9 4.2  CL 106 111 109 109 108  CO2 20 19 19 20 21   GLUCOSE 95 88 88 98 99  BUN 53* 38* 28* 23 19  CREATININE 3.16* 2.27* 1.75* 1.47* 1.35*  CALCIUM 8.1* 7.6* 7.8* 8.2* 8.5  MG  --  2.1  --   --   --   PHOS  --  3.1  --   --   --    Liver Function Tests:  Recent Labs Lab 11/27/13 1108 11/29/13 0740 11/30/13 0420  AST 21 16 15   ALT 11  7 8   ALKPHOS 103 67 67  BILITOT 1.5* 0.5 0.4  PROT 8.7* 6.4 6.1  ALBUMIN 3.4* 2.4* 2.2*   No results found for this basename: LIPASE, AMYLASE,  in the last 168 hours No results found for this basename: AMMONIA,  in the last 168 hours CBC:  Recent Labs Lab 11/27/13 1108 11/28/13 0356 11/29/13 0740 11/30/13 0420 12/01/13 0344  WBC 12.3* 6.4 3.3* 3.8* 3.9*  NEUTROABS 10.9*  --   --   --   --  HGB 11.1* 9.4* 8.5* 8.1* 8.5*  HCT 37.1 31.2* 28.2* 26.3* 28.1*  MCV 98.9 99.7 96.6 96.0 97.9  PLT 260 175 158 176 180   Cardiac Enzymes:  Recent Labs Lab 11/27/13 1108  TROPONINI <0.30   BNP: BNP (last 3 results)  Recent Labs  03/02/13 2245 03/30/13 1815  PROBNP 1436.0* 1353.0*   CBG: No results found for this basename: GLUCAP,  in the last 168 hours   Signed:  Marcellus Scott, MD, FACP, FHM. [facility] Pager [phone]  If 7PM-7AM, please contact night-coverage [email or web address] Password [email or web address] 12/02/2013, 3:26 PM

## 2013-12-02 NOTE — Care Management Note (Signed)
CARE MANAGEMENT NOTE 12/02/2013  Patient:  Margaret [name], Margaret [name]   Account Number:  [id number]  Date Initiated:  11/29/2013  Documentation initiated by:  Jonovan Boedecker  Subjective/Objective Assessment:   CM following for progression and d/c planning.     Action/Plan:   Noted pt active with [facility] for HHPT and HHOT, they will resume services at time of d/c, per CSW this pt wishes to return to home with [facility] and family support.   Anticipated DC Date:  12/02/2013   Anticipated DC Plan:  [place]         Choice offered to / List presented to:          [facility] arranged  HH-1 RN  [place].   Status of service:  Completed, signed off Medicare Important Message given?  YES (If response is "NO", the following Medicare IM given date fields will be blank) Date Medicare IM given:  12/02/2013 Medicare IM given by:  [facility] Date Additional Medicare IM given:   Additional Medicare IM given by:    Discharge Disposition:    Per UR Regulation:    If discussed at Long Length of Stay Meetings, dates discussed:    Comments:  12/02/2013 Met with pt and spoke to daughter re [facility] needs, pt wishes to use [facility] for [facility] services, which will begin within 48 hr of d/c. Pt has a walker and wheelchair at home, no other DME needs per pt. Jasmine Pang RN MPH, case manager, [phone]

## 2013-12-02 NOTE — Progress Notes (Signed)
Medicare Important Message given?  YES (If response is "NO", the following Medicare IM given date fields will be blank) Date Medicare IM given:  12/02/2013 Medicare IM given by:  Malon Branton 

## 2013-12-03 LAB — CULTURE, BLOOD (ROUTINE X 2): Culture: NO GROWTH

## 2013-12-06 DIAGNOSIS — L97809 Non-pressure chronic ulcer of other part of unspecified lower leg with unspecified severity: Secondary | ICD-10-CM | POA: Diagnosis not present

## 2013-12-06 DIAGNOSIS — I87339 Chronic venous hypertension (idiopathic) with ulcer and inflammation of unspecified lower extremity: Secondary | ICD-10-CM | POA: Diagnosis not present

## 2013-12-06 DIAGNOSIS — L97909 Non-pressure chronic ulcer of unspecified part of unspecified lower leg with unspecified severity: Secondary | ICD-10-CM | POA: Diagnosis not present

## 2013-12-17 ENCOUNTER — Emergency Department (HOSPITAL_COMMUNITY)
Admission: EM | Admit: 2013-12-17 | Discharge: 2013-12-17 | Disposition: A | Discharge status: Home / Self Care | Payer: Medicare Other | Attending: Emergency Medicine | Admitting: Emergency Medicine

## 2013-12-17 ENCOUNTER — Emergency Department (HOSPITAL_COMMUNITY): Payer: Medicare Other

## 2013-12-17 ENCOUNTER — Encounter (HOSPITAL_COMMUNITY): Payer: Self-pay | Admitting: Emergency Medicine

## 2013-12-17 DIAGNOSIS — E669 Obesity, unspecified: Secondary | ICD-10-CM | POA: Diagnosis not present

## 2013-12-17 DIAGNOSIS — Z8673 Personal history of transient ischemic attack (TIA), and cerebral infarction without residual deficits: Secondary | ICD-10-CM | POA: Insufficient documentation

## 2013-12-17 DIAGNOSIS — Z79899 Other long term (current) drug therapy: Secondary | ICD-10-CM | POA: Insufficient documentation

## 2013-12-17 DIAGNOSIS — I4891 Unspecified atrial fibrillation: Secondary | ICD-10-CM | POA: Insufficient documentation

## 2013-12-17 DIAGNOSIS — E039 Hypothyroidism, unspecified: Secondary | ICD-10-CM | POA: Diagnosis not present

## 2013-12-17 DIAGNOSIS — Z95810 Presence of automatic (implantable) cardiac defibrillator: Secondary | ICD-10-CM | POA: Insufficient documentation

## 2013-12-17 DIAGNOSIS — Z7901 Long term (current) use of anticoagulants: Secondary | ICD-10-CM | POA: Insufficient documentation

## 2013-12-17 DIAGNOSIS — IMO0002 Reserved for concepts with insufficient information to code with codable children: Secondary | ICD-10-CM | POA: Insufficient documentation

## 2013-12-17 DIAGNOSIS — N184 Chronic kidney disease, stage 4 (severe): Secondary | ICD-10-CM | POA: Insufficient documentation

## 2013-12-17 DIAGNOSIS — N289 Disorder of kidney and ureter, unspecified: Secondary | ICD-10-CM

## 2013-12-17 DIAGNOSIS — I5022 Chronic systolic (congestive) heart failure: Secondary | ICD-10-CM | POA: Insufficient documentation

## 2013-12-17 DIAGNOSIS — Z87440 Personal history of urinary (tract) infections: Secondary | ICD-10-CM | POA: Diagnosis not present

## 2013-12-17 DIAGNOSIS — I498 Other specified cardiac arrhythmias: Secondary | ICD-10-CM | POA: Insufficient documentation

## 2013-12-17 DIAGNOSIS — I129 Hypertensive chronic kidney disease with stage 1 through stage 4 chronic kidney disease, or unspecified chronic kidney disease: Secondary | ICD-10-CM | POA: Insufficient documentation

## 2013-12-17 LAB — COMPREHENSIVE METABOLIC PANEL
ALBUMIN: 3 g/dL — AB (ref 3.5–5.2)
ALT: 8 U/L (ref 0–35)
AST: 18 U/L (ref 0–37)
Alkaline Phosphatase: 92 U/L (ref 39–117)
Anion gap: 16 — ABNORMAL HIGH (ref 5–15)
BUN: 35 mg/dL — ABNORMAL HIGH (ref 6–23)
CALCIUM: 8.7 mg/dL (ref 8.4–10.5)
CO2: 24 meq/L (ref 19–32)
Chloride: 107 mEq/L (ref 96–112)
Creatinine, Ser: 2.1 mg/dL — ABNORMAL HIGH (ref 0.50–1.10)
GFR calc Af Amer: 26 mL/min — ABNORMAL LOW (ref >90)
GFR calc non Af Amer: 22 mL/min — ABNORMAL LOW (ref >90)
Glucose, Bld: 87 mg/dL (ref 70–99)
Potassium: 3.4 mEq/L — ABNORMAL LOW (ref 3.7–5.3)
SODIUM: 147 meq/L (ref 137–147)
TOTAL PROTEIN: 7.8 g/dL (ref 6.0–8.3)
Total Bilirubin: 0.8 mg/dL (ref 0.3–1.2)

## 2013-12-17 LAB — PROTIME-INR
INR: 2.42 — AB (ref 0.00–1.49)
PROTHROMBIN TIME: 26.3 s — AB (ref 11.6–15.2)

## 2013-12-17 LAB — I-STAT TROPONIN, ED: TROPONIN I, POC: 0 ng/mL (ref 0.00–0.08)

## 2013-12-17 LAB — CBC
HCT: 31.4 % — ABNORMAL LOW (ref 36.0–46.0)
Hemoglobin: 9.3 g/dL — ABNORMAL LOW (ref 12.0–15.0)
MCH: 28.4 pg (ref 26.0–34.0)
MCHC: 29.6 g/dL — AB (ref 30.0–36.0)
MCV: 96 fL (ref 78.0–100.0)
Platelets: 231 10*3/uL (ref 150–400)
RBC: 3.27 MIL/uL — AB (ref 3.87–5.11)
RDW: 15.8 % — ABNORMAL HIGH (ref 11.5–15.5)
WBC: 5.3 10*3/uL (ref 4.0–10.5)

## 2013-12-17 MED ORDER — SODIUM CHLORIDE 0.9 % IV SOLN
1000.0000 mL | INTRAVENOUS | Status: DC
  Administered 2013-12-17: 1000 mL via INTRAVENOUS

## 2013-12-17 NOTE — ED Provider Notes (Signed)
CSN: [id number]     Arrival date & time 12/17/13  1418 History   First MD Initiated Contact with Patient 12/17/13 1424     Chief Complaint  Patient presents with  . Bradycardia   HPI Patient was sent to the emergency room for evaluation of bradycardia. Patient has a complex medical history including nonischemic cardiomyopathy, atrial fibrillation and V. fib cardiac arrest. Patient has dual chamber pacemaker in defibrillator. This was recently replaced in March of 2012. Patient has routine nursing care performed at her home. A home health nurse was checking the patient and admitted that she was bradycardic on her portable monitor. Patient denied any issues with chest pain, shortness of breath, abdominal pain, lightheadedness or dizziness. The nurse was concerned about the patient's bradycardia and suggested they called EMS to have them evaluate. EMS also the patient to be bradycardic in the 30s and 40s according to their bedside monitor. They were concerned about the possibility of pacemaker malfunction so she was instructed to come to the emergency room. The patient continues to deny any complaints. Past Medical History  Diagnosis Date  . NICM (nonischemic cardiomyopathy)     EF 35%; cath 2/12 no CAD  . Systolic CHF, chronic   . Severe mitral regurgitation     s/p complex mitral valve repair with cox maze + LAA clipping, removal of RV lead, and implantation of CRT-D in abdomen  . Cardiac arrest - ventricular fibrillation     in 1990s  . Atrial fibrillation     amiodarone  . HTN (hypertension)   . HLD (hyperlipidemia)   . Achalasia     s/p dilation  . Recurrent UTI   . Obesity   . Anoxic brain injury 1996    s/p cardiac arrest   . Automatic implantable cardioverter-defibrillator in situ   . Hypothyroidism   . TIA (transient ischemic attack)   . Chronic kidney disease (CKD), stage IV (severe)     [name] 10/03/2013   Past Surgical History  Procedure Laterality Date  . Defibrillator  generator explantation and reimplantation; and  08/02/2001  . Device migration with anticipated pocket revision  10/29/2003  . 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 Attempted implantation of an implantable cardioverter-  12/30/2004  . Cardioversion  04/14/2010  . Median sternotomy  07/16/2010  . Mitral valve repair  07/16/2010  . Cox maze procedure (complete biatrial lesion set with clipping of  07/16/2010  . Removal of old endocardial right ventricular defibrillator lead.  07/16/2010  . Placement of dual chamber pacemaker and implantable cardiac  07/16/2010  . Placement of swan ganz pulmonary artery catheter via left femoral access.  07/16/2010  . Laparoscopic cholecystectomy    . Cataract extraction, bilateral    . Cystoscopy w/ stone manipulation    . Cataract extraction, bilateral Bilateral    Family History  Problem Relation Age of Onset  . Stroke Mother   . Lung cancer Father   . Diabetes Mellitus II Brother    History  Substance Use Topics  . Smoking status: Never Smoker   . Smokeless tobacco: Never Used  . Alcohol Use: No   OB History   Grav Para Term Preterm Abortions TAB SAB Ect Mult Living                 Review of Systems  All other systems reviewed and are negative.     Allergies  Review of patient's allergies indicates no known  allergies.  Home Medications   Prior to Admission medications   Medication Sig Start Date End Date Taking? Authorizing Provider  desloratadine (CLARINEX) 5 MG tablet Take 5 mg by mouth every morning.     Historical Provider, MD  fluticasone (FLONASE) 50 MCG/ACT nasal spray Place 1 spray into both nostrils at bedtime.  06/26/10   Historical Provider, MD  folic acid (FOLVITE) 1 MG tablet Take 1 tablet by mouth daily. 08/31/10   Historical Provider, MD  levothyroxine (SYNTHROID, LEVOTHROID) 50 MCG tablet Take 50 mcg by mouth daily before breakfast.    Historical Provider, MD  metoprolol tartrate  (LOPRESSOR) 25 MG tablet Take 12.5 mg by mouth 2 (two) times daily.  08/31/10   Historical Provider, MD  warfarin (COUMADIN) 1 MG tablet Take 2 mg by mouth at bedtime.    Historical Provider, MD   BP 104/52  Pulse 68  Temp(Src) 98.1 F (36.7 C) (Oral)  Resp 14  Ht  (1.626 m)  SpO2 98% Physical Exam  Nursing note and vitals reviewed. Constitutional: No distress.  HENT:  Head: Normocephalic and atraumatic.  Right Ear: External ear normal.  Left Ear: External ear normal.  Eyes: Conjunctivae are normal. Right eye exhibits no discharge. Left eye exhibits no discharge. No scleral icterus.  Neck: Neck supple. No tracheal deviation present.  Cardiovascular: Normal rate and intact distal pulses.  An irregular rhythm present.  Extrasystoles are present.  Pulmonary/Chest: Effort normal and breath sounds normal. No stridor. No respiratory distress. She has no wheezes. She has no rales.  Abdominal: Soft. Bowel sounds are normal. She exhibits no distension. There is no tenderness. There is no rebound and no guarding.  Musculoskeletal: She exhibits edema. She exhibits no tenderness.  Neurological: She is alert. She has normal strength. No cranial nerve deficit (no facial droop, extraocular movements intact, no slurred speech) or sensory deficit. She exhibits normal muscle tone. She displays no seizure activity. Coordination normal.  Skin: Skin is warm and dry. No rash noted.  Psychiatric: She has a normal mood and affect.    ED Course  Procedures (including critical care time) Labs Review Labs Reviewed  CBC - Abnormal; Notable for the following:    RBC 3.27 (*)    Hemoglobin 9.3 (*)    HCT 31.4 (*)    MCHC 29.6 (*)    RDW 15.8 (*)    All other components within normal limits  COMPREHENSIVE METABOLIC PANEL - Abnormal; Notable for the following:    Potassium 3.4 (*)    BUN 35 (*)    Creatinine, Ser 2.10 (*)    Albumin 3.0 (*)    GFR calc non Af Amer 22 (*)    GFR calc Af Amer 26 (*)     Anion gap 16 (*)    All other components within normal limits  PROTIME-INR - Abnormal; Notable for the following:    Prothrombin Time 26.3 (*)    INR 2.42 (*)    All other components within normal limits  I-STAT TROPOININ, ED    Imaging Review Dg Chest Portable 1 View  12/17/2013   CLINICAL DATA:  Bradycardia.  Hypertension.  EXAM: PORTABLE CHEST - 1 VIEW  COMPARISON:  10/03/2013.  FINDINGS: Mediastinum and hilar structures normal. Prior median sternotomy. Cardiomegaly. Leads and electrodes again noted over the chest. No focal pulmonary infiltrate no pleural effusion. No pneumothorax. No acute osseous abnormality.  IMPRESSION: 1. Stable cardiomegaly.  No CHF. 2. No acute pulmonary disease.  Chest is stable  from prior exam.   Electronically Signed   By: Maisie Fus  Register   On: 12/17/2013 15:21     EKG Interpretation   Date/Time:  Tuesday December 17 2013 14:30:45 EDT Ventricular Rate:  69 PR Interval:  210 QRS Duration: 118 QT Interval:  447 QTC Calculation: 479 R Axis:   96 Text Interpretation:  Sinus rhythm Multiple ventricular premature  complexes Incomplete right bundle branch block Low voltage, precordial  leads Nonspecific T abnormalities, lateral leads No significant change  since last tracing Confirmed by Tramel Westbrook  MD-J, Kayler Rise ([id number]) on 12/17/2013  3:02:25 PM      MDM   Final diagnoses:  Renal insufficiency    The patient is not bradycardic in the emergency department. Her heart rate is 70. Her blood pressure was also normal at 113/50.  Plan on checking for her laboratory tests.  Also interrogate her pacemaker to see if it is functioning properly.  1630 the patient's pacemaker was interrogated. No device or leak performance issues were observed. The patient did not have any arrhythmias noted  EMS and nursing staff were using a pulse oximetry-type device to check her pulse rate. The patient was having PVCs. I suspect this may have been interfering with her ability to get  an accurate pulse. She has not had any bradycardia in the emergency department and her pacemaker is functioning properly.  Patient does have worsening renal insufficiency. This was an issue when she was in the hospital. She is no longer on any diuretics for ACE inhibitor's.  This will need to be followed closely and I discussed this with the family but the patient does not require hospitalization.  Daughter did aspirate Foley catheter to be placed. They indicated that the home health nurse was supposed to do this today but did not perform the procedure for sending her to the emergency department.    Linwood Dibbles, MD 12/17/13 [phone]

## 2013-12-17 NOTE — ED Notes (Signed)
MD KNapp at the bedside  

## 2013-12-17 NOTE — ED Notes (Signed)
Per EMS, Patient is coming from home. Patient has a nurse who stops by to check on the patient. Nurse noted patient's HR was 38 when checking vitals. Patient is asymptomatic. Patient is alert and oriented x4. Patient has a Defibrillator/Pacemake placed in abdomen. Vitals per EMS: 124/56, 62 HR, 100 % on 2L.

## 2013-12-20 DIAGNOSIS — L97809 Non-pressure chronic ulcer of other part of unspecified lower leg with unspecified severity: Secondary | ICD-10-CM | POA: Diagnosis not present

## 2013-12-20 DIAGNOSIS — I87339 Chronic venous hypertension (idiopathic) with ulcer and inflammation of unspecified lower extremity: Secondary | ICD-10-CM | POA: Diagnosis not present

## 2013-12-20 DIAGNOSIS — L97909 Non-pressure chronic ulcer of unspecified part of unspecified lower leg with unspecified severity: Secondary | ICD-10-CM | POA: Diagnosis not present

## 2013-12-26 ENCOUNTER — Encounter (HOSPITAL_BASED_OUTPATIENT_CLINIC_OR_DEPARTMENT_OTHER): Payer: Medicare Other | Attending: Internal Medicine

## 2013-12-26 DIAGNOSIS — L97809 Non-pressure chronic ulcer of other part of unspecified lower leg with unspecified severity: Secondary | ICD-10-CM | POA: Insufficient documentation

## 2013-12-26 DIAGNOSIS — L97909 Non-pressure chronic ulcer of unspecified part of unspecified lower leg with unspecified severity: Principal | ICD-10-CM

## 2013-12-26 DIAGNOSIS — I87339 Chronic venous hypertension (idiopathic) with ulcer and inflammation of unspecified lower extremity: Secondary | ICD-10-CM | POA: Insufficient documentation

## 2014-01-02 DIAGNOSIS — I87339 Chronic venous hypertension (idiopathic) with ulcer and inflammation of unspecified lower extremity: Secondary | ICD-10-CM | POA: Diagnosis not present

## 2014-01-02 DIAGNOSIS — L97809 Non-pressure chronic ulcer of other part of unspecified lower leg with unspecified severity: Secondary | ICD-10-CM | POA: Diagnosis not present

## 2014-01-09 DIAGNOSIS — I87339 Chronic venous hypertension (idiopathic) with ulcer and inflammation of unspecified lower extremity: Secondary | ICD-10-CM | POA: Diagnosis not present

## 2014-01-16 DIAGNOSIS — I87339 Chronic venous hypertension (idiopathic) with ulcer and inflammation of unspecified lower extremity: Secondary | ICD-10-CM | POA: Diagnosis not present

## 2014-01-16 DIAGNOSIS — L97809 Non-pressure chronic ulcer of other part of unspecified lower leg with unspecified severity: Secondary | ICD-10-CM | POA: Diagnosis not present

## 2014-01-23 ENCOUNTER — Encounter (HOSPITAL_BASED_OUTPATIENT_CLINIC_OR_DEPARTMENT_OTHER): Payer: Medicare Other | Attending: Internal Medicine

## 2014-01-23 DIAGNOSIS — L97919 Non-pressure chronic ulcer of unspecified part of right lower leg with unspecified severity: Secondary | ICD-10-CM | POA: Insufficient documentation

## 2014-02-06 ENCOUNTER — Other Ambulatory Visit: Payer: Self-pay | Admitting: Internal Medicine

## 2014-02-06 DIAGNOSIS — L97919 Non-pressure chronic ulcer of unspecified part of right lower leg with unspecified severity: Secondary | ICD-10-CM | POA: Diagnosis present

## 2014-02-13 DIAGNOSIS — L97919 Non-pressure chronic ulcer of unspecified part of right lower leg with unspecified severity: Secondary | ICD-10-CM | POA: Diagnosis not present

## 2014-02-27 ENCOUNTER — Encounter (HOSPITAL_BASED_OUTPATIENT_CLINIC_OR_DEPARTMENT_OTHER): Payer: Medicare Other | Attending: Internal Medicine

## 2014-02-27 DIAGNOSIS — I87331 Chronic venous hypertension (idiopathic) with ulcer and inflammation of right lower extremity: Secondary | ICD-10-CM | POA: Insufficient documentation

## 2014-02-27 DIAGNOSIS — L97919 Non-pressure chronic ulcer of unspecified part of right lower leg with unspecified severity: Secondary | ICD-10-CM | POA: Insufficient documentation

## 2014-03-13 ENCOUNTER — Other Ambulatory Visit: Payer: Self-pay | Admitting: Internal Medicine

## 2014-03-13 DIAGNOSIS — L97919 Non-pressure chronic ulcer of unspecified part of right lower leg with unspecified severity: Secondary | ICD-10-CM | POA: Diagnosis not present

## 2014-03-13 DIAGNOSIS — I87331 Chronic venous hypertension (idiopathic) with ulcer and inflammation of right lower extremity: Secondary | ICD-10-CM | POA: Diagnosis not present

## 2014-05-06 ENCOUNTER — Other Ambulatory Visit: Payer: Self-pay | Admitting: Internal Medicine

## 2014-06-06 ENCOUNTER — Other Ambulatory Visit: Payer: Self-pay | Admitting: Internal Medicine

## 2014-06-14 ENCOUNTER — Other Ambulatory Visit: Payer: Self-pay | Admitting: Internal Medicine

## 2014-06-18 ENCOUNTER — Encounter: Payer: Self-pay | Admitting: *Deleted

## 2014-06-27 ENCOUNTER — Other Ambulatory Visit: Payer: Self-pay | Admitting: Internal Medicine

## 2014-07-17 ENCOUNTER — Encounter (HOSPITAL_BASED_OUTPATIENT_CLINIC_OR_DEPARTMENT_OTHER): Payer: Medicare Other | Attending: Internal Medicine

## 2014-07-17 ENCOUNTER — Encounter (HOSPITAL_COMMUNITY): Payer: Self-pay | Admitting: Emergency Medicine

## 2014-07-17 ENCOUNTER — Inpatient Hospital Stay (HOSPITAL_COMMUNITY)
Admission: EM | Admit: 2014-07-17 | Discharge: 2014-07-19 | DRG: 948 | Disposition: A | Discharge status: Home / Self Care | Payer: Medicare Other | Attending: Internal Medicine | Admitting: Internal Medicine

## 2014-07-17 DIAGNOSIS — I509 Heart failure, unspecified: Secondary | ICD-10-CM | POA: Insufficient documentation

## 2014-07-17 DIAGNOSIS — Z993 Dependence on wheelchair: Secondary | ICD-10-CM

## 2014-07-17 DIAGNOSIS — I4581 Long QT syndrome: Secondary | ICD-10-CM | POA: Diagnosis present

## 2014-07-17 DIAGNOSIS — D631 Anemia in chronic kidney disease: Secondary | ICD-10-CM | POA: Diagnosis present

## 2014-07-17 DIAGNOSIS — Z8679 Personal history of other diseases of the circulatory system: Secondary | ICD-10-CM

## 2014-07-17 DIAGNOSIS — Z8673 Personal history of transient ischemic attack (TIA), and cerebral infarction without residual deficits: Secondary | ICD-10-CM

## 2014-07-17 DIAGNOSIS — L309 Dermatitis, unspecified: Secondary | ICD-10-CM | POA: Diagnosis present

## 2014-07-17 DIAGNOSIS — N390 Urinary tract infection, site not specified: Secondary | ICD-10-CM | POA: Diagnosis present

## 2014-07-17 DIAGNOSIS — I340 Nonrheumatic mitral (valve) insufficiency: Secondary | ICD-10-CM | POA: Diagnosis present

## 2014-07-17 DIAGNOSIS — R531 Weakness: Secondary | ICD-10-CM | POA: Diagnosis present

## 2014-07-17 DIAGNOSIS — Z7901 Long term (current) use of anticoagulants: Secondary | ICD-10-CM

## 2014-07-17 DIAGNOSIS — I739 Peripheral vascular disease, unspecified: Secondary | ICD-10-CM | POA: Diagnosis not present

## 2014-07-17 DIAGNOSIS — Z9049 Acquired absence of other specified parts of digestive tract: Secondary | ICD-10-CM | POA: Diagnosis present

## 2014-07-17 DIAGNOSIS — I4891 Unspecified atrial fibrillation: Secondary | ICD-10-CM | POA: Diagnosis present

## 2014-07-17 DIAGNOSIS — E785 Hyperlipidemia, unspecified: Secondary | ICD-10-CM | POA: Diagnosis present

## 2014-07-17 DIAGNOSIS — F329 Major depressive disorder, single episode, unspecified: Secondary | ICD-10-CM | POA: Diagnosis present

## 2014-07-17 DIAGNOSIS — Z9842 Cataract extraction status, left eye: Secondary | ICD-10-CM

## 2014-07-17 DIAGNOSIS — Z6833 Body mass index (BMI) 33.0-33.9, adult: Secondary | ICD-10-CM | POA: Diagnosis not present

## 2014-07-17 DIAGNOSIS — Z950 Presence of cardiac pacemaker: Secondary | ICD-10-CM | POA: Diagnosis not present

## 2014-07-17 DIAGNOSIS — Z8674 Personal history of sudden cardiac arrest: Secondary | ICD-10-CM

## 2014-07-17 DIAGNOSIS — Z951 Presence of aortocoronary bypass graft: Secondary | ICD-10-CM | POA: Diagnosis not present

## 2014-07-17 DIAGNOSIS — Z95810 Presence of automatic (implantable) cardiac defibrillator: Secondary | ICD-10-CM

## 2014-07-17 DIAGNOSIS — E669 Obesity, unspecified: Secondary | ICD-10-CM | POA: Diagnosis present

## 2014-07-17 DIAGNOSIS — Z66 Do not resuscitate: Secondary | ICD-10-CM | POA: Diagnosis present

## 2014-07-17 DIAGNOSIS — N183 Chronic kidney disease, stage 3 unspecified: Secondary | ICD-10-CM | POA: Diagnosis present

## 2014-07-17 DIAGNOSIS — I481 Persistent atrial fibrillation: Secondary | ICD-10-CM | POA: Diagnosis not present

## 2014-07-17 DIAGNOSIS — I10 Essential (primary) hypertension: Secondary | ICD-10-CM | POA: Insufficient documentation

## 2014-07-17 DIAGNOSIS — N184 Chronic kidney disease, stage 4 (severe): Secondary | ICD-10-CM | POA: Diagnosis present

## 2014-07-17 DIAGNOSIS — I5022 Chronic systolic (congestive) heart failure: Secondary | ICD-10-CM | POA: Diagnosis present

## 2014-07-17 DIAGNOSIS — I482 Chronic atrial fibrillation: Secondary | ICD-10-CM | POA: Diagnosis present

## 2014-07-17 DIAGNOSIS — E039 Hypothyroidism, unspecified: Secondary | ICD-10-CM | POA: Diagnosis present

## 2014-07-17 DIAGNOSIS — Z87440 Personal history of urinary (tract) infections: Secondary | ICD-10-CM | POA: Diagnosis not present

## 2014-07-17 DIAGNOSIS — I252 Old myocardial infarction: Secondary | ICD-10-CM | POA: Diagnosis not present

## 2014-07-17 DIAGNOSIS — I129 Hypertensive chronic kidney disease with stage 1 through stage 4 chronic kidney disease, or unspecified chronic kidney disease: Secondary | ICD-10-CM | POA: Diagnosis present

## 2014-07-17 DIAGNOSIS — Z9889 Other specified postprocedural states: Secondary | ICD-10-CM

## 2014-07-17 DIAGNOSIS — L299 Pruritus, unspecified: Secondary | ICD-10-CM | POA: Insufficient documentation

## 2014-07-17 DIAGNOSIS — R9431 Abnormal electrocardiogram [ECG] [EKG]: Secondary | ICD-10-CM | POA: Diagnosis present

## 2014-07-17 DIAGNOSIS — Z9841 Cataract extraction status, right eye: Secondary | ICD-10-CM | POA: Diagnosis not present

## 2014-07-17 DIAGNOSIS — L209 Atopic dermatitis, unspecified: Secondary | ICD-10-CM | POA: Diagnosis present

## 2014-07-17 DIAGNOSIS — I2510 Atherosclerotic heart disease of native coronary artery without angina pectoris: Secondary | ICD-10-CM | POA: Diagnosis not present

## 2014-07-17 LAB — CBC WITH DIFFERENTIAL/PLATELET
BASOS ABS: 0 10*3/uL (ref 0.0–0.1)
Basophils Relative: 1 % (ref 0–1)
Eosinophils Absolute: 0.2 10*3/uL (ref 0.0–0.7)
Eosinophils Relative: 2 % (ref 0–5)
HCT: 36.8 % (ref 36.0–46.0)
Hemoglobin: 11.5 g/dL — ABNORMAL LOW (ref 12.0–15.0)
Lymphocytes Relative: 12 % (ref 12–46)
Lymphs Abs: 0.9 10*3/uL (ref 0.7–4.0)
MCH: 29.6 pg (ref 26.0–34.0)
MCHC: 31.3 g/dL (ref 30.0–36.0)
MCV: 94.8 fL (ref 78.0–100.0)
Monocytes Absolute: 0.6 10*3/uL (ref 0.1–1.0)
Monocytes Relative: 8 % (ref 3–12)
NEUTROS ABS: 5.5 10*3/uL (ref 1.7–7.7)
NEUTROS PCT: 77 % (ref 43–77)
PLATELETS: 336 10*3/uL (ref 150–400)
RBC: 3.88 MIL/uL (ref 3.87–5.11)
RDW: 14.9 % (ref 11.5–15.5)
WBC: 7.1 10*3/uL (ref 4.0–10.5)

## 2014-07-17 LAB — BASIC METABOLIC PANEL
Anion gap: 10 (ref 5–15)
BUN: 24 mg/dL — ABNORMAL HIGH (ref 6–23)
CO2: 30 mmol/L (ref 19–32)
Calcium: 8.8 mg/dL (ref 8.4–10.5)
Chloride: 100 mmol/L (ref 96–112)
Creatinine, Ser: 1.98 mg/dL — ABNORMAL HIGH (ref 0.50–1.10)
GFR calc Af Amer: 27 mL/min — ABNORMAL LOW (ref >90)
GFR calc non Af Amer: 24 mL/min — ABNORMAL LOW (ref >90)
Glucose, Bld: 137 mg/dL — ABNORMAL HIGH (ref 70–99)
POTASSIUM: 3.7 mmol/L (ref 3.5–5.1)
Sodium: 140 mmol/L (ref 135–145)

## 2014-07-17 LAB — PROTIME-INR
INR: 1.9 — AB (ref 0.00–1.49)
Prothrombin Time: 22 seconds — ABNORMAL HIGH (ref 11.6–15.2)

## 2014-07-17 LAB — MAGNESIUM: MAGNESIUM: 2.4 mg/dL (ref 1.5–2.5)

## 2014-07-17 NOTE — ED Provider Notes (Signed)
CSN: [id number]     Arrival date & time 07/17/14  1928 History   First MD Initiated Contact with Patient 07/17/14 2000     Chief Complaint  Patient presents with  . Near Syncope     (Consider location/radiation/quality/duration/timing/severity/associated sxs/prior Treatment) HPI  74 year old female presents after her legs came week while she is getting out of car about 2 hours prior to arrival. Patient states that this happened before, usually when she has an infection. She's had many UTIs before and has had problems with her legs. Patient states she never felt dizzy or lightheaded. She did not hit the ground but sat down. Never had chest pain or shortness of breath. Patient has been having right leg problems for the last several months, was seen at her wound clinic and prescribed doxycycline for drainage noted on her leg. No shortness of breath. Patient states that she usually has to have a wheelchair but she is typically able to stand up on both feet for a few moments to give the wheelchair enough time to get under her. She cannot walk normally. Patient states that the weakness was abnormal for her today. No upper extremity weakness. No headaches or back pain. Chronically has a Foley catheter but no burning, abdominal pain, or cloudy urine.  Past Medical History  Diagnosis Date  . NICM (nonischemic cardiomyopathy)     EF 35%; cath 2/12 no CAD  . Systolic CHF, chronic   . Severe mitral regurgitation     s/p complex mitral valve repair with cox maze + LAA clipping, removal of RV lead, and implantation of CRT-D in abdomen  . Cardiac arrest - ventricular fibrillation     in 1990s  . Atrial fibrillation     amiodarone  . HTN (hypertension)   . HLD (hyperlipidemia)   . Achalasia     s/p dilation  . Recurrent UTI   . Obesity   . Anoxic brain injury 1996    s/p cardiac arrest   . Automatic implantable cardioverter-defibrillator in situ   . Hypothyroidism   . TIA (transient ischemic  attack)   . Chronic kidney disease (CKD), stage IV (severe)     [name] 10/03/2013   Past Surgical History  Procedure Laterality Date  . Defibrillator generator explantation and reimplantation; and  08/02/2001  . Device migration with anticipated pocket revision  10/29/2003  . 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 Attempted implantation of an implantable cardioverter-  12/30/2004  . Cardioversion  04/14/2010  . Median sternotomy  07/16/2010  . Mitral valve repair  07/16/2010  . Cox maze procedure (complete biatrial lesion set with clipping of  07/16/2010  . Removal of old endocardial right ventricular defibrillator lead.  07/16/2010  . Placement of dual chamber pacemaker and implantable cardiac  07/16/2010  . Placement of swan ganz pulmonary artery catheter via left femoral access.  07/16/2010  . Laparoscopic cholecystectomy    . Cataract extraction, bilateral    . Cystoscopy w/ stone manipulation    . Cataract extraction, bilateral Bilateral    Family History  Problem Relation Age of Onset  . Stroke Mother   . Lung cancer Father   . Diabetes Mellitus II Brother    History  Substance Use Topics  . Smoking status: Never Smoker   . Smokeless tobacco: Never Used  . Alcohol Use: No   OB History    No data available     Review of Systems  Constitutional:  Negative for fever and chills.  Respiratory: Negative for cough and shortness of breath.   Cardiovascular: Positive for leg swelling.  Gastrointestinal: Negative for nausea, vomiting, abdominal pain and diarrhea.  Genitourinary: Negative for dysuria and decreased urine volume.  Musculoskeletal: Negative for back pain.  Skin: Positive for color change and wound.  Neurological: Positive for weakness. Negative for dizziness, light-headedness, numbness and headaches.  All other systems reviewed and are negative.     Allergies  Review of patient's allergies indicates no known  allergies.  Home Medications   Prior to Admission medications   Medication Sig Start Date End Date Taking? Authorizing Provider  desloratadine (CLARINEX) 5 MG tablet Take 5 mg by mouth every morning.     Historical Provider, MD  fluticasone (FLONASE) 50 MCG/ACT nasal spray Place 1 spray into both nostrils at bedtime.  06/26/10   Historical Provider, MD  folic acid (FOLVITE) 1 MG tablet Take 1 tablet by mouth daily. 08/31/10   Historical Provider, MD  KLOR-CON M10 10 MEQ tablet TAKE 4 TABLETS BY MOUTH TWICE DAILY. 06/17/14   Duke Salvia, MD  levothyroxine (SYNTHROID, LEVOTHROID) 50 MCG tablet Take 50 mcg by mouth daily before breakfast.    Historical Provider, MD  metoprolol tartrate (LOPRESSOR) 25 MG tablet Take 12.5 mg by mouth 2 (two) times daily.  08/31/10   Historical Provider, MD  warfarin (COUMADIN) 1 MG tablet Take 2 mg by mouth at bedtime.    Historical Provider, MD   BP 106/43 mmHg  Pulse 74  Temp(Src) 98.7 F (37.1 C) (Oral)  Resp 17  SpO2 98% Physical Exam  Constitutional: She is oriented to person, place, and time. She appears well-developed and well-nourished.  HENT:  Head: Normocephalic and atraumatic.  Right Ear: External ear normal.  Left Ear: External ear normal.  Nose: Nose normal.  Eyes: EOM are normal. Pupils are equal, round, and reactive to light. Right eye exhibits no discharge. Left eye exhibits no discharge.  Cardiovascular: Normal rate, regular rhythm and normal heart sounds.   Pulmonary/Chest: Effort normal and breath sounds normal.  Abdominal: Soft. There is no tenderness.  Musculoskeletal: She exhibits edema.  RLE with diffuse swelling and erythema. Wheeping noted  Neurological: She is alert and oriented to person, place, and time.  CN 2-12 grossly intact. Equal strength in upper extremities. Lower extremities decreased strength bilaterally, barely able to get them off bed (remarks this is typical for her).  Skin: Skin is warm and dry.  Vitals  reviewed.   ED Course  Procedures (including critical care time) Labs Review Labs Reviewed  BASIC METABOLIC PANEL - Abnormal; Notable for the following:    Glucose, Bld 137 (*)    BUN 24 (*)    Creatinine, Ser 1.98 (*)    GFR calc non Af Amer 24 (*)    GFR calc Af Amer 27 (*)    All other components within normal limits  CBC WITH DIFFERENTIAL/PLATELET - Abnormal; Notable for the following:    Hemoglobin 11.5 (*)    All other components within normal limits  PROTIME-INR - Abnormal; Notable for the following:    Prothrombin Time 22.0 (*)    INR 1.90 (*)    All other components within normal limits  MAGNESIUM    Imaging Review No results found.   EKG Interpretation   Date/Time:  Thursday July 17 2014 19:46:12 EDT Ventricular Rate:  76 PR Interval:  235 QRS Duration: 127 QT Interval:  509 QTC Calculation: 572 R Axis:  124 Text Interpretation:  Right and left arm electrode reversal,  interpretation assumes no reversal Accelerated junctional rhythm  Nonspecific intraventricular conduction delay Nonspecific T abnormalities,  lateral leads artifact limits interpretation Confirmed by Janson Lamar  MD,  Atilano Covelli ([id number]) on 07/17/2014 8:05:54 PM      MDM   Final diagnoses:  Prolonged Q-T interval on ECG  Weakness    Patient appears well here, has diffuse lower extremity weakness that is typical for her. She does not she got near syncopal but she does have a prolonged QTc at 572. This appears new for her compared to her most recent EKG. The patient's telemetry monitoring is showing no abnormalities otherwise. Electrolytes are benign. She's not have urinary symptoms, fever, vomiting, or abdominal pain and thus I have held off on checking urine. Due to high QTC will need admission with telemetry.    Pricilla Loveless, MD 07/17/14 [phone]

## 2014-07-17 NOTE — ED Notes (Addendum)
Pt presents to ED via EMS for near syncope. Per family, felt weak and was eased to ground. Pt baseline needs assistant to stand but more weaker than normal. Pt being treated for right leg cellulitis. Per EMS, CBG-110, BP-118/palpated. Pt alerts and oriented x4 at this time, airway intact.

## 2014-07-18 DIAGNOSIS — R531 Weakness: Principal | ICD-10-CM

## 2014-07-18 DIAGNOSIS — E039 Hypothyroidism, unspecified: Secondary | ICD-10-CM

## 2014-07-18 DIAGNOSIS — Z7901 Long term (current) use of anticoagulants: Secondary | ICD-10-CM

## 2014-07-18 DIAGNOSIS — I10 Essential (primary) hypertension: Secondary | ICD-10-CM

## 2014-07-18 DIAGNOSIS — Z8673 Personal history of transient ischemic attack (TIA), and cerebral infarction without residual deficits: Secondary | ICD-10-CM

## 2014-07-18 DIAGNOSIS — I481 Persistent atrial fibrillation: Secondary | ICD-10-CM

## 2014-07-18 DIAGNOSIS — Z95810 Presence of automatic (implantable) cardiac defibrillator: Secondary | ICD-10-CM

## 2014-07-18 DIAGNOSIS — E785 Hyperlipidemia, unspecified: Secondary | ICD-10-CM

## 2014-07-18 DIAGNOSIS — N184 Chronic kidney disease, stage 4 (severe): Secondary | ICD-10-CM

## 2014-07-18 DIAGNOSIS — I4581 Long QT syndrome: Secondary | ICD-10-CM

## 2014-07-18 LAB — CBC
HCT: 32.2 % — ABNORMAL LOW (ref 36.0–46.0)
HEMOGLOBIN: 10.1 g/dL — AB (ref 12.0–15.0)
MCH: 29.4 pg (ref 26.0–34.0)
MCHC: 31.4 g/dL (ref 30.0–36.0)
MCV: 93.9 fL (ref 78.0–100.0)
PLATELETS: 274 10*3/uL (ref 150–400)
RBC: 3.43 MIL/uL — ABNORMAL LOW (ref 3.87–5.11)
RDW: 14.9 % (ref 11.5–15.5)
WBC: 5.4 10*3/uL (ref 4.0–10.5)

## 2014-07-18 LAB — COMPREHENSIVE METABOLIC PANEL
ALK PHOS: 80 U/L (ref 39–117)
ALT: 10 U/L (ref 0–35)
ANION GAP: 13 (ref 5–15)
AST: 17 U/L (ref 0–37)
Albumin: 2.7 g/dL — ABNORMAL LOW (ref 3.5–5.2)
BILIRUBIN TOTAL: 0.7 mg/dL (ref 0.3–1.2)
BUN: 24 mg/dL — ABNORMAL HIGH (ref 6–23)
CALCIUM: 8.6 mg/dL (ref 8.4–10.5)
CO2: 24 mmol/L (ref 19–32)
Chloride: 104 mmol/L (ref 96–112)
Creatinine, Ser: 1.89 mg/dL — ABNORMAL HIGH (ref 0.50–1.10)
GFR calc Af Amer: 29 mL/min — ABNORMAL LOW (ref >90)
GFR calc non Af Amer: 25 mL/min — ABNORMAL LOW (ref >90)
GLUCOSE: 98 mg/dL (ref 70–99)
Potassium: 3.6 mmol/L (ref 3.5–5.1)
SODIUM: 141 mmol/L (ref 135–145)
Total Protein: 6.8 g/dL (ref 6.0–8.3)

## 2014-07-18 LAB — TSH: TSH: 11.631 u[IU]/mL — ABNORMAL HIGH (ref 0.350–4.500)

## 2014-07-18 LAB — VITAMIN B12: Vitamin B-12: 561 pg/mL (ref 211–911)

## 2014-07-18 LAB — TROPONIN I
Troponin I: <0.03 ng/mL (ref <0.031)
Troponin I: <0.03 ng/mL (ref <0.031)
Troponin I: <0.03 ng/mL (ref <0.031)

## 2014-07-18 LAB — PROTIME-INR
INR: 2.04 — ABNORMAL HIGH (ref 0.00–1.49)
PROTHROMBIN TIME: 23.3 s — AB (ref 11.6–15.2)

## 2014-07-18 LAB — BRAIN NATRIURETIC PEPTIDE: B NATRIURETIC PEPTIDE 5: 141.4 pg/mL — AB (ref 0.0–100.0)

## 2014-07-18 MED ORDER — FUROSEMIDE 80 MG PO TABS
80.0000 mg | ORAL_TABLET | Freq: Four times a day (QID) | ORAL | Status: DC
  Administered 2014-07-18 – 2014-07-19 (×6): 80 mg via ORAL
  Filled 2014-07-18 (×9): qty 1

## 2014-07-18 MED ORDER — ACETAMINOPHEN 650 MG RE SUPP: 650.0000 mg | Freq: Four times a day (QID) | RECTAL | Status: DC | PRN

## 2014-07-18 MED ORDER — SODIUM CHLORIDE 0.9 % IJ SOLN
3.0000 mL | Freq: Two times a day (BID) | INTRAMUSCULAR | Status: DC
  Administered 2014-07-18 (×3): 3 mL via INTRAVENOUS

## 2014-07-18 MED ORDER — METOPROLOL TARTRATE 12.5 MG HALF TABLET
12.5000 mg | ORAL_TABLET | Freq: Two times a day (BID) | ORAL | Status: DC
  Administered 2014-07-18 – 2014-07-19 (×3): 12.5 mg via ORAL
  Filled 2014-07-18 (×5): qty 1

## 2014-07-18 MED ORDER — LORATADINE 10 MG PO TABS
10.0000 mg | ORAL_TABLET | Freq: Every day | ORAL | Status: DC
  Administered 2014-07-18 – 2014-07-19 (×2): 10 mg via ORAL
  Filled 2014-07-18 (×2): qty 1

## 2014-07-18 MED ORDER — DIPHENHYDRAMINE HCL 25 MG PO CAPS
50.0000 mg | ORAL_CAPSULE | Freq: Once | ORAL | Status: AC
  Administered 2014-07-18: 50 mg via ORAL
  Filled 2014-07-18: qty 2

## 2014-07-18 MED ORDER — FLUTICASONE PROPIONATE 50 MCG/ACT NA SUSP
1.0000 | Freq: Every day | NASAL | Status: DC
  Administered 2014-07-18: 1 via NASAL
  Filled 2014-07-18: qty 16

## 2014-07-18 MED ORDER — HYDROCERIN EX CREA
TOPICAL_CREAM | Freq: Two times a day (BID) | CUTANEOUS | Status: DC
  Administered 2014-07-18 – 2014-07-19 (×3): via TOPICAL
  Filled 2014-07-18: qty 113

## 2014-07-18 MED ORDER — FOLIC ACID 1 MG PO TABS
1.0000 mg | ORAL_TABLET | Freq: Every day | ORAL | Status: DC
  Administered 2014-07-18 – 2014-07-19 (×2): 1 mg via ORAL
  Filled 2014-07-18 (×2): qty 1

## 2014-07-18 MED ORDER — ACETAMINOPHEN 325 MG PO TABS: 650.0000 mg | ORAL_TABLET | Freq: Four times a day (QID) | ORAL | Status: DC | PRN

## 2014-07-18 MED ORDER — WARFARIN SODIUM 3 MG PO TABS
3.0000 mg | ORAL_TABLET | ORAL | Status: AC
  Administered 2014-07-18: 3 mg via ORAL
  Filled 2014-07-18: qty 1

## 2014-07-18 MED ORDER — WARFARIN - PHARMACIST DOSING INPATIENT: Freq: Every day | Status: DC

## 2014-07-18 MED ORDER — FLUOXETINE HCL 20 MG PO CAPS
20.0000 mg | ORAL_CAPSULE | Freq: Every day | ORAL | Status: DC
  Administered 2014-07-18: 20 mg via ORAL
  Filled 2014-07-18 (×2): qty 1

## 2014-07-18 MED ORDER — LEVOTHYROXINE SODIUM 50 MCG PO TABS
50.0000 ug | ORAL_TABLET | Freq: Every day | ORAL | Status: DC
  Administered 2014-07-18: 50 ug via ORAL
  Filled 2014-07-18 (×2): qty 1

## 2014-07-18 MED ORDER — WARFARIN SODIUM 3 MG PO TABS
3.0000 mg | ORAL_TABLET | ORAL | Status: AC
Start: 2014-07-18 — End: 2014-07-19
  Filled 2014-07-18: qty 1

## 2014-07-18 MED ORDER — KETOCONAZOLE 2 % EX CREA
TOPICAL_CREAM | Freq: Every day | CUTANEOUS | Status: DC
  Administered 2014-07-18 – 2014-07-19 (×2): via TOPICAL
  Filled 2014-07-18: qty 15

## 2014-07-18 MED ORDER — WARFARIN SODIUM 2 MG PO TABS
2.0000 mg | ORAL_TABLET | Freq: Every day | ORAL | Status: DC
Start: 2014-07-18 — End: 2014-07-19
  Administered 2014-07-18: 2 mg via ORAL
  Filled 2014-07-18 (×2): qty 1

## 2014-07-18 MED ORDER — LEVOTHYROXINE SODIUM 75 MCG PO TABS
75.0000 ug | ORAL_TABLET | Freq: Every day | ORAL | Status: DC
  Administered 2014-07-19: 75 ug via ORAL
  Filled 2014-07-18 (×2): qty 1

## 2014-07-18 NOTE — Progress Notes (Signed)
ANTICOAGULATION CONSULT NOTE - Initial Consult  Pharmacy Consult for Coumadin Indication: atrial fibrillation  No Known Allergies  Patient Measurements: Height:  (162.6 cm) Weight: 195 lb (88.451 kg) IBW/kg (Calculated) : 54.7  Vital Signs: Temp: 98.1 F (36.7 C) (03/25 0051) Temp Source: Oral (03/25 0051) BP: 133/36 mmHg (03/25 0051) Pulse Rate: 82 (03/25 0051)  Labs:  Recent Labs  07/17/14 2128 07/17/14 2129  HGB 11.5*  --   HCT 36.8  --   PLT 336  --   LABPROT  --  22.0*  INR  --  1.90*  CREATININE 1.98*  --     Estimated Creatinine Clearance: 26.8 mL/min (by C-G formula based on Cr of 1.98).   Medical History: Past Medical History  Diagnosis Date  . NICM (nonischemic cardiomyopathy)     EF 35%; cath 2/12 no CAD  . Systolic CHF, chronic   . Severe mitral regurgitation     s/p complex mitral valve repair with cox maze + LAA clipping, removal of RV lead, and implantation of CRT-D in abdomen  . Cardiac arrest - ventricular fibrillation     in 1990s  . Atrial fibrillation     amiodarone  . HTN (hypertension)   . HLD (hyperlipidemia)   . Achalasia     s/p dilation  . Recurrent UTI   . Obesity   . Anoxic brain injury 1996    s/p cardiac arrest   . Automatic implantable cardioverter-defibrillator in situ   . Hypothyroidism   . TIA (transient ischemic attack)   . Chronic kidney disease (CKD), stage IV (severe)     [name] 10/03/2013    Medications:  Prescriptions prior to admission  Medication Sig Dispense Refill Last Dose  . desloratadine (CLARINEX) 5 MG tablet Take 5 mg by mouth every morning.    07/17/2014 at Unknown time  . fluticasone (FLONASE) 50 MCG/ACT nasal spray Place 1 spray into both nostrils at bedtime.    07/16/2014 at Unknown time  . folic acid (FOLVITE) 1 MG tablet Take 1 tablet by mouth daily.   07/17/2014 at Unknown time  . furosemide (LASIX) 80 MG tablet Take 80 mg by mouth 4 (four) times daily.   07/17/2014 at Unknown time  .  levothyroxine (SYNTHROID, LEVOTHROID) 50 MCG tablet Take 50 mcg by mouth daily before breakfast.   07/17/2014 at Unknown time  . metoprolol tartrate (LOPRESSOR) 25 MG tablet Take 12.5 mg by mouth 2 (two) times daily.    07/17/2014 at 0800  . ondansetron (ZOFRAN) 4 MG tablet Take 4 mg by mouth every 8 (eight) hours as needed for nausea or vomiting.   unknown at unknown  . potassium chloride (K-DUR,KLOR-CON) 10 MEQ tablet Take 10 mEq by mouth See admin instructions. Take 8 tablets a day per family and med list   07/17/2014 at Unknown time  . warfarin (COUMADIN) 1 MG tablet Take 2 mg by mouth at bedtime.   07/16/2014 at Unknown time  . KLOR-CON M10 10 MEQ tablet TAKE 4 TABLETS BY MOUTH TWICE DAILY. (Patient not taking: Reported on 07/17/2014) 240 tablet 0 Not Taking at Unknown time   Scheduled:  . fluticasone  1 spray Each Nare QHS  . folic acid  1 mg Oral Daily  . furosemide  80 mg Oral Q6H  . levothyroxine  50 mcg Oral QAC breakfast  . loratadine  10 mg Oral Daily  . metoprolol tartrate  12.5 mg Oral BID  . sodium chloride  3 mL Intravenous Q12H  Assessment: 74yo female c/o generalized weakness, has similar sx w/ recurrent UTI 2/2 dwelling catheter though she reports she does not feel as though she is having UTI currently, found w/ new QTc prolongation, to continue Coumadin during admission for further w/u; current INR slightly below goal w/ last dose of Coumadin taken 3/23.  Goal of Therapy:  INR 2-3   Plan:  Will give boosted Coumadin dose of 3mg  po x1 now and then continue home dose of 2mg  daily and monitor INR for dose adjustments.  Vernard Gambles, PharmD, BCPS  07/18/2014,1:18 AM

## 2014-07-18 NOTE — H&P (Signed)
[facility] History and Physical  MC HOLLEN ZOX:[id number] DOB: 05-21-1940 DOA: 07/17/2014  Referring physician: ED physician PCP: Allean Found, MD  Specialists:   Chief Complaint: Generalized weakness.  HPI: Margaret [name] is a 74 y.o. female with past medical history mitral valve regurgitation (s/p of repair), systolic congestive heart failure (EF35-40%), A. fib on Coumadin, AICD, history of TIA, chronic kidney disease-stage IV, who presents with generalized weakness.  Patient report that when she was getting out of car in afternoon, she felt weaker in both legs. She did not hit the ground, but sat down.She could not stand up on drive way. She denies unilateral weakness, numbness or tingling sensations in her extremities. She did not pass out. Patient states she did not feel dizzy or lightheaded. Patient states that this happened before, usually when she had an infection. She has dwelling catheter and has had many UTIs before, but does not think having UTI this time. Her catheter was changed yesterday, and looked okay. Patient has been having right leg problems for the last several months, was seen at her wound clinic and prescribed doxycycline for drainage, but she has not taken it. Patient denies fever, chills, headaches, cough, chest pain, SOB, abdominal pain, diarrhea. No vision change or hearing loss.  In ED, patient was found to have QTC prolongation 572 which is new. Temperature 98.7, WBC 7.1, stable renal function. Patient is admitted to inpatient for further evaluation and treatment.  Review of Systems: As presented in the history of presenting illness, rest negative.  Where does patient live?  At home Can patient participate in ADLs? barely  Allergy: No Known Allergies  Past Medical History  Diagnosis Date  . NICM (nonischemic cardiomyopathy)     EF 35%; cath 2/12 no CAD  . Systolic CHF, chronic   . Severe mitral regurgitation     s/p complex mitral valve  repair with cox maze + LAA clipping, removal of RV lead, and implantation of CRT-D in abdomen  . Cardiac arrest - ventricular fibrillation     in 1990s  . Atrial fibrillation     amiodarone  . HTN (hypertension)   . HLD (hyperlipidemia)   . Achalasia     s/p dilation  . Recurrent UTI   . Obesity   . Anoxic brain injury 1996    s/p cardiac arrest   . Automatic implantable cardioverter-defibrillator in situ   . Hypothyroidism   . TIA (transient ischemic attack)   . Chronic kidney disease (CKD), stage IV (severe)     [name] 10/03/2013    Past Surgical History  Procedure Laterality Date  . Defibrillator generator explantation and reimplantation; and  08/02/2001  . Device migration with anticipated pocket revision  10/29/2003  . 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 Attempted implantation of an implantable cardioverter-  12/30/2004  . Cardioversion  04/14/2010  . Median sternotomy  07/16/2010  . Mitral valve repair  07/16/2010  . Cox maze procedure (complete biatrial lesion set with clipping of  07/16/2010  . Removal of old endocardial right ventricular defibrillator lead.  07/16/2010  . Placement of dual chamber pacemaker and implantable cardiac  07/16/2010  . Placement of swan ganz pulmonary artery catheter via left femoral access.  07/16/2010  . Laparoscopic cholecystectomy    . Cataract extraction, bilateral    . Cystoscopy w/ stone manipulation    . Cataract extraction, bilateral Bilateral     Social History:  reports that she  has never smoked. She has never used smokeless tobacco. She reports that she does not drink alcohol or use illicit drugs.  Family History:  Family History  Problem Relation Age of Onset  . Stroke Mother   . Lung cancer Father   . Diabetes Mellitus II Brother      Prior to Admission medications   Medication Sig Start Date End Date Taking? Authorizing Provider  desloratadine (CLARINEX) 5 MG tablet Take 5 mg  by mouth every morning.    Yes Historical Provider, MD  fluticasone (FLONASE) 50 MCG/ACT nasal spray Place 1 spray into both nostrils at bedtime.  06/26/10  Yes Historical Provider, MD  folic acid (FOLVITE) 1 MG tablet Take 1 tablet by mouth daily. 08/31/10  Yes Historical Provider, MD  furosemide (LASIX) 80 MG tablet Take 80 mg by mouth 4 (four) times daily.   Yes Historical Provider, MD  levothyroxine (SYNTHROID, LEVOTHROID) 50 MCG tablet Take 50 mcg by mouth daily before breakfast.   Yes Historical Provider, MD  metoprolol tartrate (LOPRESSOR) 25 MG tablet Take 12.5 mg by mouth 2 (two) times daily.  08/31/10  Yes Historical Provider, MD  ondansetron (ZOFRAN) 4 MG tablet Take 4 mg by mouth every 8 (eight) hours as needed for nausea or vomiting.   Yes Historical Provider, MD  potassium chloride (K-DUR,KLOR-CON) 10 MEQ tablet Take 10 mEq by mouth See admin instructions. Take 8 tablets a day per family and med list   Yes Historical Provider, MD  warfarin (COUMADIN) 1 MG tablet Take 2 mg by mouth at bedtime.   Yes Historical Provider, MD  KLOR-CON M10 10 MEQ tablet TAKE 4 TABLETS BY MOUTH TWICE DAILY. Patient not taking: Reported on 07/17/2014 06/17/14   Duke Salvia, MD    Physical Exam: Filed Vitals:   07/17/14 2145 07/17/14 2215 07/17/14 2245 07/17/14 2300  BP: 115/44 128/65 128/49 122/51  Pulse: 73 77 80 78  Temp:      TempSrc:      Resp: 15 17 17 15   SpO2: 98% 99% 100% 99%   General: Not in acute distress HEENT:       Eyes: PERRL, EOMI, no scleral icterus       ENT: No discharge from the ears and nose, no pharynx injection, no tonsillar enlargement.        Neck: No JVD, no bruit, no mass felt. Cardiac: S1/S2, RRR, No murmurs, No gallops or rubs Pulm: Good air movement bilaterally. Clear to auscultation bilaterally. No rales, wheezing, rhonchi or rubs. Abd: Soft, nondistended, nontender, no rebound pain, no organomegaly, BS present Ext: has trace leg edema bilaterally. RLE with chronic  skin tear, diffuse erythema, wheeping, not obviously infected to me. 2+DP/PT pulse bilaterally Musculoskeletal: No joint deformities, erythema, or stiffness, ROM full Skin: No rashes.  Neuro: Alert and oriented X3, cranial nerves II-XII grossly intact, muscle strength 4/5 in all extremeties, sensation to light touch intact. Brachial reflex 1+ bilaterally. Knee reflex 1+ bilaterally. Negative Babinski's sign. Marland Kitchen Psych: Patient is not psychotic, no suicidal or hemocidal ideation.  Labs on Admission:  Basic Metabolic Panel:  Recent Labs Lab 07/17/14 2128  NA 140  K 3.7  CL 100  CO2 30  GLUCOSE 137*  BUN 24*  CREATININE 1.98*  CALCIUM 8.8  MG 2.4   Liver Function Tests: No results for input(s): AST, ALT, ALKPHOS, BILITOT, PROT, ALBUMIN in the last 168 hours. No results for input(s): LIPASE, AMYLASE in the last 168 hours. No results for input(s): AMMONIA in  the last 168 hours. CBC:  Recent Labs Lab 07/17/14 2128  WBC 7.1  NEUTROABS 5.5  HGB 11.5*  HCT 36.8  MCV 94.8  PLT 336   Cardiac Enzymes: No results for input(s): CKTOTAL, CKMB, CKMBINDEX, TROPONINI in the last 168 hours.  BNP (last 3 results) No results for input(s): BNP in the last 8760 hours.  ProBNP (last 3 results) No results for input(s): PROBNP in the last 8760 hours.  CBG: No results for input(s): GLUCAP in the last 168 hours.  Radiological Exams on Admission: No results found.  EKG: Independently reviewed. RAD and QTC=572  Assessment/Plan Principal Problem:   Weakness generalized Active Problems:   Automatic implantable cardioverter-defibrillator in situ   Personal history of sudden cardiac arrest   Atrial fibrillation   Obesity   S/P Maze operation for atrial fibrillation   S/P mitral valve repair   Chronic kidney disease (CKD), stage IV (severe)   Hypertension   Hyperlipidemia   Long-term (current) use of anticoagulants   History of TIA (transient ischemic attack) withour residua    Hypothyroidism   Prolonged Q-T interval on ECG  Generalized weakness: Etiology is not clear. It is likely due to multifactorial etiologies, including deconditioning due to multiple comorbidities, chronic right leg problems, possible arrhythmia given the prolongation of QTC. No focal neurological findings for stroke.  -will admit to tele bed -trop x 3 and repeat EKG in AM -get UA, Ux and blood culture x2 -treat chronic medical problems as below  Systolic congestive heart failure: 2-D echo on 04/14/10 showed yes 35-40%. Patient is on high-dose Lasix 80 mg 4 times a day. Her volume status seems to be okay on admission. -will continue home Lasix -Check BNP -continue Metoprolol  Atrial Fibrillation: CHA2DS2-VASc Score is 7, need oral anticoagulation. Patient is on coumadin at home. INR is 1.9 on admission. Heart rate is well controlled. -will continue Coumadin per pharmacy -Continue metoprolol  QTC prolongation: Likely due to Zofran use at home -Hold Zofran -Repeat EKG morning  CKD-IV: Baseline creatinine 1.32 to 2.0. Her creatinine is 1.98, which is close to baseline -Follow-up renal function but BMP  Hypertension:  -on metoprolol and Lasix  Hypothyroidism: TSH was a 41.360 on 11/28/13. Patient on Synthroid at home and -Continue Synthroid -check TSH  Right leg skin lesions: Doesn't look like infected.  -Consult to wound care team    DVT ppx: on Coumadin with INR=1.9 Code Status: Full code (husband strongly wants patient to be DNR, but daughter wants pt to be full code, patient seems to be not ready to make decision)  Family Communication: Yes, patient's  family      at bed side Disposition Plan: Admit to inpatient   Date of Service 07/18/2014    Lorretta Harp [facility] Pager [phone]  If 7PM-7AM, please contact night-coverage [email or web address] Password [email or web address] 07/18/2014, 12:02 AM

## 2014-07-18 NOTE — Evaluation (Signed)
Physical Therapy Evaluation Patient Details Name: Margaret [name] MRN: [id number] DOB: [birth year] Today's Date: 07/18/2014   History of Present Illness  Margaret [name] is a 74 y.o. female with past medical history mitral valve regurgitation (s/p of repair), systolic congestive heart failure (EF35-40%), A. fib on Coumadin, AICD, history of TIA, chronic kidney disease-stage IV, who presents with generalized weakness.  Clinical Impression  Pt moving well with limited activity tolerance and self-limiting behavior. Pt agitated with the mention of HHPT as is disrupts her routine, dgtr favorable but understands pt lack of willingness. Pt with decline over the last year and comfortable with performing HEP and transfers with assist. Pt with decreased strength, balance and activity tolerance who will benefit from acute therapy to maximize mobility, function and transfers to decrease burden of care.      Follow Up Recommendations No PT follow up (pt would benefit from HHPT and additional therapy but she strongly declines agreeable to acute only)    Equipment Recommendations  None recommended by PT    Recommendations for Other Services       Precautions / Restrictions Precautions Precautions: Fall Precaution Comments: catheter Restrictions Weight Bearing Restrictions: No      Mobility  Bed Mobility Overal bed mobility: Needs Assistance Bed Mobility: Supine to Sit     Supine to sit: Supervision;HOB elevated     General bed mobility comments: cues for sequence with increased time, use of rail and pivoting bil LE to EOB with cues for scooting to edge  Transfers Overall transfer level: Needs assistance Equipment used: 1 person hand held assist Transfers: Sit to/from [employer] Sit to Stand: Min assist Stand pivot transfers: Min assist       General transfer comment: pt grasping PT forearm with therapist holding belt with cues for anterior translation, hand placement  for transition and safety. Pt requires 2 attempts to stand each trial secondary to decreased anterior translation. Pt fearful of falling with all mobility and self limiting activity  Ambulation/Gait Ambulation/Gait assistance:  (pt denied attempting)              Stairs            Wheelchair Mobility    Modified Rankin (Stroke Patients Only)       Balance Overall balance assessment: Needs assistance;History of Falls   Sitting balance-Leahy Scale: Good       Standing balance-Leahy Scale: Poor                               Pertinent Vitals/Pain Pain Assessment: No/denies pain    Home Living Family/patient expects to be discharged to:: Private residence Living Arrangements: Spouse/significant other Available Help at Discharge: Family;Available 24 hours/day Type of Home: House Home Access: Ramped entrance     Home Layout: One level Home Equipment: Bedside commode;Cane - single point;Hospital bed;Wheelchair - manual;Other (comment) (transport chair) Additional Comments: Pt reports that her daughter stays with her during the day and spouse is there at night. Aide 2hrs/day on the weekend    Prior Function Level of Independence: Needs assistance   Gait / Transfers Assistance Needed: pt with stand pivot transfers for all mobility, has not walked for a year and states no desire to attempt because it is too hard. Dgtr assists with all transfers as well as OOb in the AM  ADL's / Homemaking Assistance Needed: dgtr gives her a sponge bath, performs pericare and does the  housework        Hand Dominance        Extremity/Trunk Assessment   Upper Extremity Assessment: Generalized weakness           Lower Extremity Assessment: Generalized weakness      Cervical / Trunk Assessment: Kyphotic  Communication   Communication: No difficulties  Cognition Arousal/Alertness: Awake/alert Behavior During Therapy: Flat affect Overall Cognitive Status:  Within Functional Limits for tasks assessed                      General Comments      Exercises General Exercises - Lower Extremity Long Arc Quad: AROM;Both;10 reps;Seated Hip Flexion/Marching: AROM;Both;10 reps;Seated      Assessment/Plan    PT Assessment Patient needs continued PT services  PT Diagnosis Difficulty walking;Generalized weakness   PT Problem List Decreased strength;Decreased activity tolerance;Decreased mobility;Decreased cognition;Decreased safety awareness;Obesity;Decreased balance  PT Treatment Interventions Functional mobility training;Therapeutic activities;Therapeutic exercise;Balance training;Patient/family education   PT Goals (Current goals can be found in the Care Plan section) Acute Rehab PT Goals Patient Stated Goal: go home without falling PT Goal Formulation: With patient/family Time For Goal Achievement: 07/25/14 Potential to Achieve Goals: Fair    Frequency Min 3X/week   Barriers to discharge        Co-evaluation               End of Session Equipment Utilized During Treatment: Gait belt Activity Tolerance: Patient tolerated treatment well Patient left: in chair;with call bell/phone within reach;with family/visitor present Nurse Communication: Mobility status         Time: 5462-7035 PT Time Calculation (min) (ACUTE ONLY): 23 min   Charges:   PT Evaluation $Initial PT Evaluation Tier I: 1 Procedure PT Treatments $Therapeutic Activity: 8-22 mins   PT G CodesDelorse Lek 07/18/2014, 12:35 PM Delaney Meigs, PT [phone]

## 2014-07-18 NOTE — Progress Notes (Signed)
Utilization review completed.  

## 2014-07-18 NOTE — Progress Notes (Signed)
[facility]                                                                              Patient Demographics  Margaret [name], is a 74 y.o. female, DOB - [birth year], ZOX:[id number]  Admit date - 07/17/2014   Admitting Physician Lorretta Harp, MD  Outpatient Primary MD for the patient is Allean Found, MD  LOS - 1   Chief Complaint  Patient presents with  . Near Syncope      HPI on 07/18/2014 by Dr. Lorretta Harp Margaret [name] is a 74 y.o. female with past medical history mitral valve regurgitation (s/p of repair), systolic congestive heart failure (EF35-40%), A. fib on Coumadin, AICD, history of TIA, chronic kidney disease-stage IV, who presents with generalized weakness. Patient report that when she was getting out of car in afternoon, she felt weaker in both legs. She did not hit the ground, but sat down.She could not stand up on drive way. She denies unilateral weakness, numbness or tingling sensations in her extremities. She did not pass out. Patient states she did not feel dizzy or lightheaded. Patient states that this happened before, usually when she had an infection. She has dwelling catheter and has had many UTIs before, but does not think having UTI this time. Her catheter was changed yesterday, and looked okay. Patient has been having right leg problems for the last several months, was seen at her wound clinic and prescribed doxycycline for drainage, but she has not taken it. Patient denies fever, chills, headaches, cough, chest pain, SOB, abdominal pain, diarrhea. No vision change or hearing loss.  In ED, patient was found to have QTC prolongation 572 which is new. Temperature 98.7, WBC 7.1, stable renal function. Patient is admitted to inpatient for further evaluation and treatment.  Assessment & Plan   Generalized weakness, chronic -Patient has deconditioning due to multiple comorbidities including chronic right leg problems -Patient states she has been wheelchair-bound  however is unable to explain why or how long -Troponins cycled, currently negative 3 -Patient did have prolonged QTC EKG -Blood cultures pending, UA pending -Patient currently afebrile, no leukocytosis or source of infection at this time -PT consulted and appreciated-recommended home health however patient refuses -TSH 11.631, will increase patient's Synthroid -Will check vitamin B-12 and folate levels  Chronic systolic congestive heart failure -Patient currently appears euvolemic -Echocardiogram in 2011 showed EF 35-40% -Continue home Lasix regimen, 80 mg 4 times daily -Continue metoprolol -Possibly no ACEi due to CKD?  Chronic Atrial fibrillation on anticoagulation -ChadsVASC 7 -Continue Coumadin per pharmacy -Currently rate controlled, continue metoprolol -INR was subtherapeutic on admission, 1.9, currently therapeutic  QTC prolongation -Posterior secondary Zofran, pending repeat EKG  CKD stage IV -Continue to monitor BMP -Patient is on high-dose Lasix  Hypertension -Continue metoprolol and Lasix  Hypothyroidism -TSH In August 2015 was 41.3, currently 11.631 -Increased Synthroid to 75 g daily -Patient will need to follow-up with primary care physician within 4 weeks for recheck  Right leg skin lesions/breast dermatitis -Wound care consulted -Will order ketoconazole cream for breast dermatitis  Code Status: Full  Family Communication: None at bedside  Disposition Plan: Admitted, likely discharge to home 3/26  Time  Spent in minutes   30 minutes  Procedures  None  Consults   None  DVT Prophylaxis  Coumadin  Lab Results  Component Value Date   PLT 274 07/18/2014    Medications  Scheduled Meds: . fluticasone  1 spray Each Nare QHS  . folic acid  1 mg Oral Daily  . furosemide  80 mg Oral Q6H  . hydrocerin   Topical BID  . ketoconazole   Topical Daily  . [START ON 07/19/2014] levothyroxine  75 mcg Oral QAC breakfast  . loratadine  10 mg Oral Daily    . metoprolol tartrate  12.5 mg Oral BID  . sodium chloride  3 mL Intravenous Q12H  . warfarin  2 mg Oral q1800  . warfarin  3 mg Oral NOW  . Warfarin - Pharmacist Dosing Inpatient   Does not apply q1800   Continuous Infusions:  PRN Meds:.acetaminophen **OR** acetaminophen  Antibiotics     Anti-infectives    None        Subjective:   Margaret [name] seen and examined today.  Patient states she had this chronic weakness and has been wheelchair bound for quite some time. Cannot answer specific questions regarding her weakness or inability to walk. Cannot answer questions for her home medications and what she takes. Patient simply refuses physical therapy and nursing home placement. States her family can take care of her. Patient denies shortness of breath, chest pain, abdominal pain.   Objective:   Filed Vitals:   07/17/14 2300 07/17/14 2345 07/18/14 0051 07/18/14 0449  BP: 122/51 123/45 133/36 125/60  Pulse: 78 85 82 83  Temp:   98.1 F (36.7 C) 98.6 F (37 C)  TempSrc:   Oral Oral  Resp: Height:    (1.626 m)   Weight:   88.451 kg (195 lb) 88.451 kg (195 lb)  SpO2: 99% 97% 100% 99%    Wt Readings from Last 3 Encounters:  07/18/14 88.451 kg (195 lb)  12/01/13 99.927 kg (220 lb 4.8 oz)  10/23/13 97.614 kg (215 lb 3.2 oz)     Intake/Output Summary (Last 24 hours) at 07/18/14 1254 Last data filed at 07/17/14 2341  Gross per 24 hour  Intake      0 ml  Output    475 ml  Net   -475 ml    Exam  General: Well developed, well nourished, NAD  HEENT: NCAT, mucous membranes moist.   Cardiovascular: S1 S2 auscultated,RRR  Respiratory: Clear to auscultation  Abdomen: Soft, obese, nontender, nondistended, + bowel sounds  Extremities: warm dry without cyanosis clubbing. Trace edema in LE B/L, RLE- chronic skin tear  Data Review   Micro Results No results found for this or any previous visit (from the past 240 hour(s)).  Radiology Reports No  results found.  CBC  Recent Labs Lab 07/17/14 2128 07/18/14 0706  WBC 7.1 5.4  HGB 11.5* 10.1*  HCT 36.8 32.2*  PLT 336 274  MCV 94.8 93.9  MCH 29.6 29.4  MCHC 31.3 31.4  RDW 14.9 14.9  LYMPHSABS 0.9  --   MONOABS 0.6  --   EOSABS 0.2  --   BASOSABS 0.0  --     Chemistries   Recent Labs Lab 07/17/14 2128 07/18/14 0706  NA 140 141  K 3.7 3.6  CL 100 104  CO2 30 24  GLUCOSE 137* 98  BUN 24* 24*  CREATININE 1.98* 1.89*  CALCIUM 8.8 8.6  MG 2.4  --  AST  --  17  ALT  --  10  ALKPHOS  --  80  BILITOT  --  0.7   ------------------------------------------------------------------------------------------------------------------ estimated creatinine clearance is 28.1 mL/min (by C-G formula based on Cr of 1.89). ------------------------------------------------------------------------------------------------------------------ No results for input(s): HGBA1C in the last 72 hours. ------------------------------------------------------------------------------------------------------------------ No results for input(s): CHOL, HDL, LDLCALC, TRIG, CHOLHDL, LDLDIRECT in the last 72 hours. ------------------------------------------------------------------------------------------------------------------  Recent Labs  07/18/14 0245  TSH 11.631*   ------------------------------------------------------------------------------------------------------------------ No results for input(s): VITAMINB12, FOLATE, FERRITIN, TIBC, IRON, RETICCTPCT in the last 72 hours.  Coagulation profile  Recent Labs Lab 07/17/14 2129 07/18/14 0706  INR 1.90* 2.04*    No results for input(s): DDIMER in the last 72 hours.  Cardiac Enzymes  Recent Labs Lab 07/18/14 0245 07/18/14 0706  TROPONINI <0.03 <0.03   ------------------------------------------------------------------------------------------------------------------ Invalid input(s): POCBNP    Latron Ribas D.O. on 07/18/2014  at 12:54 PM  Between 7am to 7pm - Pager - [phone]  After 7pm go to [email or web address] - password [email or web address]  And look for the night coverage person covering for me after hours  [facility] Office  [phone]

## 2014-07-19 DIAGNOSIS — E669 Obesity, unspecified: Secondary | ICD-10-CM

## 2014-07-19 LAB — BASIC METABOLIC PANEL
Anion gap: 9 (ref 5–15)
BUN: 26 mg/dL — AB (ref 6–23)
CALCIUM: 8.2 mg/dL — AB (ref 8.4–10.5)
CO2: 29 mmol/L (ref 19–32)
CREATININE: 1.72 mg/dL — AB (ref 0.50–1.10)
Chloride: 99 mmol/L (ref 96–112)
GFR calc non Af Amer: 28 mL/min — ABNORMAL LOW (ref >90)
GFR, EST AFRICAN AMERICAN: 33 mL/min — AB (ref >90)
GLUCOSE: 93 mg/dL (ref 70–99)
Potassium: 2.8 mmol/L — ABNORMAL LOW (ref 3.5–5.1)
Sodium: 137 mmol/L (ref 135–145)

## 2014-07-19 LAB — CBC
HCT: 29.6 % — ABNORMAL LOW (ref 36.0–46.0)
HEMOGLOBIN: 9.2 g/dL — AB (ref 12.0–15.0)
MCH: 29.3 pg (ref 26.0–34.0)
MCHC: 31.1 g/dL (ref 30.0–36.0)
MCV: 94.3 fL (ref 78.0–100.0)
Platelets: 293 10*3/uL (ref 150–400)
RBC: 3.14 MIL/uL — AB (ref 3.87–5.11)
RDW: 15.1 % (ref 11.5–15.5)
WBC: 4.5 10*3/uL (ref 4.0–10.5)

## 2014-07-19 LAB — URINALYSIS, ROUTINE W REFLEX MICROSCOPIC
Bilirubin Urine: NEGATIVE
GLUCOSE, UA: NEGATIVE mg/dL
Ketones, ur: NEGATIVE mg/dL
NITRITE: POSITIVE — AB
PH: 6.5 (ref 5.0–8.0)
Protein, ur: NEGATIVE mg/dL
SPECIFIC GRAVITY, URINE: 1.009 (ref 1.005–1.030)
Urobilinogen, UA: 1 mg/dL (ref 0.0–1.0)

## 2014-07-19 LAB — PROTIME-INR
INR: 1.9 — AB (ref 0.00–1.49)
Prothrombin Time: 22 seconds — ABNORMAL HIGH (ref 11.6–15.2)

## 2014-07-19 LAB — URINE MICROSCOPIC-ADD ON

## 2014-07-19 LAB — POTASSIUM: POTASSIUM: 3.3 mmol/L — AB (ref 3.5–5.1)

## 2014-07-19 MED ORDER — POTASSIUM CHLORIDE CRYS ER 20 MEQ PO TBCR
60.0000 meq | EXTENDED_RELEASE_TABLET | Freq: Once | ORAL | Status: AC
  Administered 2014-07-19: 60 meq via ORAL
  Filled 2014-07-19: qty 3

## 2014-07-19 MED ORDER — KETOCONAZOLE 2 % EX CREA: TOPICAL_CREAM | Freq: Every day | CUTANEOUS | Status: DC

## 2014-07-19 MED ORDER — LEVOTHYROXINE SODIUM 75 MCG PO TABS: 75.0000 ug | ORAL_TABLET | Freq: Every day | ORAL | Status: DC

## 2014-07-19 MED ORDER — FLUOXETINE HCL 20 MG PO CAPS: 20.0000 mg | ORAL_CAPSULE | Freq: Every day | ORAL | Status: DC

## 2014-07-19 MED ORDER — POTASSIUM CHLORIDE CRYS ER 20 MEQ PO TBCR
20.0000 meq | EXTENDED_RELEASE_TABLET | Freq: Once | ORAL | Status: AC
  Administered 2014-07-19: 20 meq via ORAL
  Filled 2014-07-19: qty 1

## 2014-07-19 NOTE — Progress Notes (Signed)
ANTICOAGULATION CONSULT NOTE  Pharmacy Consult for Coumadin Indication: atrial fibrillation  No Known Allergies  Patient Measurements: Height:  (162.6 cm) Weight: 196 lb (88.905 kg) IBW/kg (Calculated) : 54.7  Vital Signs: Temp: 97.7 F (36.5 C) (03/26 0544) Temp Source: Oral (03/26 0544) BP: 110/55 mmHg (03/26 0544) Pulse Rate: 74 (03/26 0544)  Labs:  Recent Labs  07/17/14 2128 07/17/14 2129 07/18/14 0245 07/18/14 0706 07/18/14 1306 07/19/14 0349  HGB 11.5*  --   --  10.1*  --  9.2*  HCT 36.8  --   --  32.2*  --  29.6*  PLT 336  --   --  274  --  293  LABPROT  --  22.0*  --  23.3*  --  22.0*  INR  --  1.90*  --  2.04*  --  1.90*  CREATININE 1.98*  --   --  1.89*  --  1.72*  TROPONINI  --   --  <0.03 <0.03 <0.03  --     Estimated Creatinine Clearance: 31 mL/min (by C-G formula based on Cr of 1.72).   Medical History: Past Medical History  Diagnosis Date  . NICM (nonischemic cardiomyopathy)     EF 35%; cath 2/12 no CAD  . Systolic CHF, chronic   . Severe mitral regurgitation     s/p complex mitral valve repair with cox maze + LAA clipping, removal of RV lead, and implantation of CRT-D in abdomen  . Cardiac arrest - ventricular fibrillation     in 1990s  . Atrial fibrillation     amiodarone  . HTN (hypertension)   . HLD (hyperlipidemia)   . Achalasia     s/p dilation  . Recurrent UTI   . Obesity   . Anoxic brain injury 1996    s/p cardiac arrest   . Automatic implantable cardioverter-defibrillator in situ   . Hypothyroidism   . TIA (transient ischemic attack)   . Chronic kidney disease (CKD), stage IV (severe)     [name] 10/03/2013    Medications:  Prescriptions prior to admission  Medication Sig Dispense Refill Last Dose  . desloratadine (CLARINEX) 5 MG tablet Take 5 mg by mouth every morning.    07/17/2014 at Unknown time  . fluticasone (FLONASE) 50 MCG/ACT nasal spray Place 1 spray into both nostrils at bedtime.    07/16/2014 at Unknown time   . folic acid (FOLVITE) 1 MG tablet Take 1 tablet by mouth daily.   07/17/2014 at Unknown time  . furosemide (LASIX) 80 MG tablet Take 80 mg by mouth 4 (four) times daily.   07/17/2014 at Unknown time  . levothyroxine (SYNTHROID, LEVOTHROID) 50 MCG tablet Take 50 mcg by mouth daily before breakfast.   07/17/2014 at Unknown time  . metoprolol tartrate (LOPRESSOR) 25 MG tablet Take 12.5 mg by mouth 2 (two) times daily.    07/17/2014 at 0800  . ondansetron (ZOFRAN) 4 MG tablet Take 4 mg by mouth every 8 (eight) hours as needed for nausea or vomiting.   unknown at unknown  . potassium chloride (K-DUR,KLOR-CON) 10 MEQ tablet Take 10 mEq by mouth See admin instructions. Take 8 tablets a day per family and med list   07/17/2014 at Unknown time  . warfarin (COUMADIN) 1 MG tablet Take 2 mg by mouth at bedtime.   07/16/2014 at Unknown time  . KLOR-CON M10 10 MEQ tablet TAKE 4 TABLETS BY MOUTH TWICE DAILY. (Patient not taking: Reported on 07/17/2014) 240 tablet 0 Not Taking at  Unknown time   Scheduled:  . FLUoxetine  20 mg Oral QHS  . fluticasone  1 spray Each Nare QHS  . folic acid  1 mg Oral Daily  . furosemide  80 mg Oral Q6H  . hydrocerin   Topical BID  . ketoconazole   Topical Daily  . levothyroxine  75 mcg Oral QAC breakfast  . loratadine  10 mg Oral Daily  . metoprolol tartrate  12.5 mg Oral BID  . sodium chloride  3 mL Intravenous Q12H  . warfarin  2 mg Oral q1800  . Warfarin - Pharmacist Dosing Inpatient   Does not apply q1800    Assessment: 74yo female c/o generalized weakness, has similar sx w/ recurrent UTI 2/2 dwelling catheter though she reports she does not feel as though she is having UTI currently, found w/ new QTc prolongation, to continue Coumadin during admission for further w/u; current INR slightly below goal.   Goal of Therapy:  INR 2-3   Plan:  -Warfarin 2 mg po daily -Daily INR    Agapito Games, PharmD, BCPS Clinical Pharmacist Pager: [phone] 07/19/2014 11:48 AM

## 2014-07-19 NOTE — Discharge Instructions (Signed)

## 2014-07-19 NOTE — Discharge Summary (Signed)
Physician Discharge Summary  [name] RUE:[id number] DOB: 1940-11-01 DOA: 07/17/2014  PCP: Allean Found, MD  Admit date: 07/17/2014 Discharge date: 07/19/2014  Time spent: 45 minutes  Recommendations for Outpatient Follow-up:  Patient will be discharged to home.  She will need to follow up with her primary care physician and cardiologist within 1-2 weeks of discharge.  Patient should have her INR and BMP checked at that time.  Continue medications as prescribed and follow heart healthy diet/1200 mL fluid restriction per day.    Discharge Diagnoses:  Generalized weakness, chronic Chronic systolic congestive heart failure Chronic atrial fibrillation on anticoagulation QTC prolongation CKD, stage IV Hypertension next and hypothyroidism Right leg skin lesion/breast dermatitis Asymptomatic bacteriuria Chronic anemia Depression  Discharge Condition: Stable  Diet recommendation: Heart healthy, 1200 mL fluid restriction per day  [facility] Weights   07/18/14 0051 07/18/14 0449 07/19/14 0544  Weight: 88.451 kg (195 lb) 88.451 kg (195 lb) 88.905 kg (196 lb)    History of present illness:  on 07/18/2014 by Dr. Lorretta Harp Margaret [name] is a 74 y.o. female with past medical history mitral valve regurgitation (s/p of repair), systolic congestive heart failure (EF35-40%), A. fib on Coumadin, AICD, history of TIA, chronic kidney disease-stage IV, who presents with generalized weakness. Patient report that when she was getting out of car in afternoon, she felt weaker in both legs. She did not hit the ground, but sat down.She could not stand up on drive way. She denies unilateral weakness, numbness or tingling sensations in her extremities. She did not pass out. Patient states she did not feel dizzy or lightheaded. Patient states that this happened before, usually when she had an infection. She has dwelling catheter and has had many UTIs before, but does not think having UTI this time. Her  catheter was changed yesterday, and looked okay. Patient has been having right leg problems for the last several months, was seen at her wound clinic and prescribed doxycycline for drainage, but she has not taken it. Patient denies fever, chills, headaches, cough, chest pain, SOB, abdominal pain, diarrhea. No vision change or hearing loss.  In ED, patient was found to have QTC prolongation 572 which is new. Temperature 98.7, WBC 7.1, stable renal function. Patient is admitted to inpatient for further evaluation and treatment.  Hospital Course:  Generalized weakness, chronic -Patient has deconditioning due to multiple comorbidities including chronic right leg problems -Patient states she has been wheelchair-bound however and has had weakness since 76 -Spoke with Daughter, patient decided to stop walking on her own, she did not have a medical problem leading to her immobility  -Patient does not wish to have PT or rehab -Blood culture negative to date -Troponins cycled, currently negative 3 -Patient currently afebrile, no leukocytosis  -PT consulted and appreciated-recommended home health however patient refuses -TSH 11.631, increased Synthroid -B12: 561, folate and Vit D pending (can be followed by PCP)  Chronic systolic congestive heart failure -Patient currently appears euvolemic -Echocardiogram in 2011 showed EF 35-40% -Continue home Lasix regimen, 80 mg 4 times daily -Continue metoprolol -Possibly no ACEi due to CKD?  Chronic Atrial fibrillation on anticoagulation -ChadsVASC 7 -Continue Coumadin per pharmacy -Currently rate controlled, continue metoprolol -INR was subtherapeutic on admission, 1.9, currently therapeutic  QTC prolongation -Looked at previous EKGs, patient has had QT prolongation since 2013 -Patient has AICD in place -Follow up with cardiology as an outpatient -Potassium correct, Mg 2.4  CKD stage IV -Continue to monitor BMP -Patient is on high-dose  Lasix  Hypertension -Continue metoprolol and Lasix  Hypothyroidism -TSH In August 2015 was 41.3, currently 11.631 -Increased Synthroid to 75 g daily -Patient will need to follow-up with primary care physician within 4-6 weeks for recheck  Right leg skin lesions/breast dermatitis -Wound care consulted -Continue ketoconazole cream for breast dermatitis  Asymptomatic bacteriuria  -patient has an indwelling foley catheter -No complaints of dysuria or abdominal pain -UA: many bacteria, 7-10WBC, positive nitrites, mod leukocytes - possible colonization -Patient has history of MDRO  Chronic anemia -Likely secondary to CKD -Baseline Hb 9-10, currently 9.2  Depression -Spoke with patient and daughter (via phone) -Urged patient to see counseling -Spoke with pharmacy regarding antidepressive agent, will start patient on low dose prozac (less chance of QTc prolongation)  Procedures: None  Consultations: Cardiology regarding EKG  Discharge Exam: Filed Vitals:   07/19/14 0544  BP: 110/55  Pulse: 74  Temp: 97.7 F (36.5 C)  Resp: 18   Exam  General: Well developed, well nourished, NAD  HEENT: NCAT, mucous membranes moist.   Cardiovascular: S1 S2 auscultated,RRR  Respiratory: Clear to auscultation  Abdomen: Soft, obese, nontender, nondistended, + bowel sounds  Extremities: warm dry without cyanosis clubbing. Trace edema in LE B/L, RLE- chronic skin tear  Discharge Instructions      Discharge Instructions    Discharge instructions    Complete by:  As directed   Patient will be discharged to home.  She will need to follow up with her primary care physician and cardiologist within 1-2 weeks of discharge.  Patient should have her INR and BMP checked at that time.  Continue medications as prescribed and follow heart healthy diet/1200 mL fluid restriction per day.            Medication List    STOP taking these medications        ondansetron 4 MG tablet   Commonly known as:  ZOFRAN      TAKE these medications        desloratadine 5 MG tablet  Commonly known as:  CLARINEX  Take 5 mg by mouth every morning.     FLUoxetine 20 MG capsule  Commonly known as:  PROZAC  Take 1 capsule (20 mg total) by mouth at bedtime.     fluticasone 50 MCG/ACT nasal spray  Commonly known as:  FLONASE  Place 1 spray into both nostrils at bedtime.     folic acid 1 MG tablet  Commonly known as:  FOLVITE  Take 1 tablet by mouth daily.     furosemide 80 MG tablet  Commonly known as:  LASIX  Take 80 mg by mouth 4 (four) times daily.     ketoconazole 2 % cream  Commonly known as:  NIZORAL  Apply topically daily.     levothyroxine 75 MCG tablet  Commonly known as:  SYNTHROID, LEVOTHROID  Take 1 tablet (75 mcg total) by mouth daily before breakfast.     metoprolol tartrate 25 MG tablet  Commonly known as:  LOPRESSOR  Take 12.5 mg by mouth 2 (two) times daily.     potassium chloride 10 MEQ tablet  Commonly known as:  K-DUR,KLOR-CON  Take 10 mEq by mouth See admin instructions. Take 8 tablets a day per family and med list     warfarin 1 MG tablet  Commonly known as:  COUMADIN  Take 2 mg by mouth at bedtime.       No Known Allergies Follow-up Information    Follow up with [facility]  THIELE, MD. Schedule an appointment as soon as possible for a visit in 1 week.   Specialty:  Family Medicine   Why:  Hospital follow up, repeat BMP, INR   Contact information:   [address] [place] [address] [phone]        The results of significant diagnostics from this hospitalization (including imaging, microbiology, ancillary and laboratory) are listed below for reference.    Significant Diagnostic Studies: No results found.  Microbiology: Recent Results (from the past 240 hour(s))  Culture, blood (routine x 2)     Status: None (Preliminary result)   Collection Time: 07/18/14  2:45 AM  Result Value Ref Range Status    Specimen Description BLOOD RIGHT HAND  Final   Special Requests BOTTLES DRAWN AEROBIC AND ANAEROBIC 5CC  Final   Culture   Final           BLOOD CULTURE RECEIVED NO GROWTH TO DATE CULTURE WILL BE HELD FOR 5 DAYS BEFORE ISSUING A FINAL NEGATIVE REPORT Performed at [facility]    Report Status PENDING  Incomplete  Culture, blood (routine x 2)     Status: None (Preliminary result)   Collection Time: 07/18/14  2:50 AM  Result Value Ref Range Status   Specimen Description BLOOD RIGHT ANTECUBITAL  Final   Special Requests BOTTLES DRAWN AEROBIC AND ANAEROBIC 5CC  Final   Culture   Final           BLOOD CULTURE RECEIVED NO GROWTH TO DATE CULTURE WILL BE HELD FOR 5 DAYS BEFORE ISSUING A FINAL NEGATIVE REPORT Performed at [facility]    Report Status PENDING  Incomplete     Labs: Basic Metabolic Panel:  Recent Labs Lab 07/17/14 2128 07/18/14 0706 07/19/14 0349 07/19/14 1243  NA 140 141 137  --   K 3.7 3.6 2.8* 3.3*  CL 100 104 99  --   CO2 30 24 29   --   GLUCOSE 137* 98 93  --   BUN 24* 24* 26*  --   CREATININE 1.98* 1.89* 1.72*  --   CALCIUM 8.8 8.6 8.2*  --   MG 2.4  --   --   --    Liver Function Tests:  Recent Labs Lab 07/18/14 0706  AST 17  ALT 10  ALKPHOS 80  BILITOT 0.7  PROT 6.8  ALBUMIN 2.7*   No results for input(s): LIPASE, AMYLASE in the last 168 hours. No results for input(s): AMMONIA in the last 168 hours. CBC:  Recent Labs Lab 07/17/14 2128 07/18/14 0706 07/19/14 0349  WBC 7.1 5.4 4.5  NEUTROABS 5.5  --   --   HGB 11.5* 10.1* 9.2*  HCT 36.8 32.2* 29.6*  MCV 94.8 93.9 94.3  PLT 336 274 293   Cardiac Enzymes:  Recent Labs Lab 07/18/14 0245 07/18/14 0706 07/18/14 1306  TROPONINI <0.03 <0.03 <0.03   BNP: BNP (last 3 results)  Recent Labs  07/18/14 0248  BNP 141.4*    ProBNP (last 3 results) No results for input(s): PROBNP in the last 8760 hours.  CBG: No results for input(s): GLUCAP in the last 168  hours.     SignedEdsel Petrin  [facility] 07/19/2014, 2:06 PM

## 2014-07-20 LAB — URINE CULTURE: Colony Count: >=100000

## 2014-07-21 LAB — VITAMIN D 25 HYDROXY (VIT D DEFICIENCY, FRACTURES): Vit D, 25-Hydroxy: 13.3 ng/mL — ABNORMAL LOW (ref 30.0–100.0)

## 2014-07-21 LAB — FOLATE RBC
Folate, RBC: >1851 ng/mL (ref >498)
Hematocrit: 33.5 % — ABNORMAL LOW (ref 34.0–46.6)

## 2014-07-24 ENCOUNTER — Encounter: Payer: Self-pay | Admitting: *Deleted

## 2014-07-24 LAB — CULTURE, BLOOD (ROUTINE X 2)
CULTURE: NO GROWTH
Culture: NO GROWTH

## 2014-07-25 ENCOUNTER — Encounter (HOSPITAL_BASED_OUTPATIENT_CLINIC_OR_DEPARTMENT_OTHER): Payer: Medicare Other | Attending: Internal Medicine

## 2014-07-25 DIAGNOSIS — L209 Atopic dermatitis, unspecified: Secondary | ICD-10-CM | POA: Insufficient documentation

## 2014-07-25 DIAGNOSIS — B958 Unspecified staphylococcus as the cause of diseases classified elsewhere: Secondary | ICD-10-CM | POA: Insufficient documentation

## 2014-07-25 LAB — VITAMIN D 1,25 DIHYDROXY
VITAMIN D3 1, 25 (OH): 40 pg/mL
Vitamin D 1, 25 (OH)2 Total: 40 pg/mL
Vitamin D2 1, 25 (OH)2: <10 pg/mL

## 2014-10-16 ENCOUNTER — Other Ambulatory Visit: Payer: Self-pay | Admitting: Internal Medicine

## 2014-10-17 ENCOUNTER — Other Ambulatory Visit: Payer: Self-pay

## 2014-10-17 MED ORDER — POTASSIUM CHLORIDE CRYS ER 10 MEQ PO TBCR: 10.0000 meq | EXTENDED_RELEASE_TABLET | ORAL | Status: DC

## 2014-11-06 ENCOUNTER — Encounter: Payer: Self-pay | Admitting: Adult Health

## 2014-11-06 ENCOUNTER — Encounter: Payer: Self-pay | Admitting: *Deleted

## 2014-11-06 ENCOUNTER — Non-Acute Institutional Stay (SKILLED_NURSING_FACILITY): Payer: Medicare Other | Admitting: Adult Health

## 2014-11-06 DIAGNOSIS — I10 Essential (primary) hypertension: Secondary | ICD-10-CM

## 2014-11-06 DIAGNOSIS — I482 Chronic atrial fibrillation, unspecified: Secondary | ICD-10-CM

## 2014-11-06 DIAGNOSIS — J309 Allergic rhinitis, unspecified: Secondary | ICD-10-CM | POA: Diagnosis not present

## 2014-11-06 DIAGNOSIS — I5022 Chronic systolic (congestive) heart failure: Secondary | ICD-10-CM

## 2014-11-06 DIAGNOSIS — F329 Major depressive disorder, single episode, unspecified: Secondary | ICD-10-CM

## 2014-11-06 DIAGNOSIS — E039 Hypothyroidism, unspecified: Secondary | ICD-10-CM | POA: Diagnosis not present

## 2014-11-06 DIAGNOSIS — E876 Hypokalemia: Secondary | ICD-10-CM | POA: Diagnosis not present

## 2014-11-06 DIAGNOSIS — N3281 Overactive bladder: Secondary | ICD-10-CM | POA: Insufficient documentation

## 2014-11-06 DIAGNOSIS — F32A Depression, unspecified: Secondary | ICD-10-CM

## 2014-11-06 DIAGNOSIS — Z7901 Long term (current) use of anticoagulants: Secondary | ICD-10-CM | POA: Diagnosis not present

## 2014-11-06 DIAGNOSIS — B372 Candidiasis of skin and nail: Secondary | ICD-10-CM | POA: Diagnosis not present

## 2014-11-06 DIAGNOSIS — N184 Chronic kidney disease, stage 4 (severe): Secondary | ICD-10-CM | POA: Diagnosis not present

## 2014-11-06 NOTE — Progress Notes (Signed)
Patient ID: Margaret [name], female   DOB: [birth year], 74 y.o.   MRN: [id number]   11/06/2014  Facility:  Nursing Home Location:  [name] Room Number: 805-2 LEVEL OF CARE:  SNF (31)   Chief Complaint  Patient presents with  . Acute Visit    Hypertension, chronic kidney disease stage IV, atrial fibrillation, long-term use of anticoagulation, hypothyroidism, chronic systolic CHF, allergic rhinitis, hypokalemia, overactive bladder and Candida    HISTORY OF PRESENT ILLNESS:  This is a 74 year old female who has been admitted to [facility] on 11/05/14 from home. Patient and family wanting higher level of care. She has past medical history of MVR (S/P repair), systolic CHF (EF 40-98%), atrial fibrillation on Coumadin, AICD, history of TIA and CK D stage IV.  She has been admitted for long-term care.  PAST MEDICAL HISTORY:  Past Medical History  Diagnosis Date  . NICM (nonischemic cardiomyopathy)     EF 35%; cath 2/12 no CAD  . Systolic CHF, chronic   . Severe mitral regurgitation     s/p complex mitral valve repair with cox maze + LAA clipping, removal of RV lead, and implantation of CRT-D in abdomen  . Cardiac arrest - ventricular fibrillation     in 1990s  . Atrial fibrillation     amiodarone  . HTN (hypertension)   . HLD (hyperlipidemia)   . Achalasia     s/p dilation  . Recurrent UTI   . Obesity   . Anoxic brain injury 1996    s/p cardiac arrest   . Automatic implantable cardioverter-defibrillator in situ   . Hypothyroidism   . TIA (transient ischemic attack)   . Chronic kidney disease (CKD), stage IV (severe)     [name] 10/03/2013    CURRENT MEDICATIONS: Reviewed per MAR/see medication list    Medication List       This list is accurate as of: 11/06/14  7:22 PM.  Always use your most recent med list.               clobetasol ointment 0.05 %  Commonly known as:  TEMOVATE  Apply 1 application topically every morning. For rt leg,  abdomen, and under breast.     desloratadine 5 MG tablet  Commonly known as:  CLARINEX  Take 5 mg by mouth every morning. For allergies     DULoxetine 20 MG capsule  Commonly known as:  CYMBALTA  Take 20 mg by mouth daily. For depression     fluticasone 50 MCG/ACT nasal spray  Commonly known as:  FLONASE  Place 1 spray into both nostrils at bedtime. For allergies     folic acid 1 MG tablet  Commonly known as:  FOLVITE  Take 1 tablet by mouth daily.     furosemide 80 MG tablet  Commonly known as:  LASIX  Take 80 mg by mouth 2 (two) times daily. Take 2 tabs = 160 mg PO BID     levothyroxine 50 MCG tablet  Commonly known as:  SYNTHROID, LEVOTHROID  Take 50 mcg by mouth daily before breakfast.     metoprolol tartrate 25 MG tablet  Commonly known as:  LOPRESSOR  Take 12.5 mg by mouth 2 (two) times daily.     NIGHT-TIME SLEEP AID PO  Take 1 tablet by mouth at bedtime.     ondansetron 4 MG tablet  Commonly known as:  ZOFRAN  Take 4 mg by mouth every 6 (six) hours as  needed for nausea or vomiting.     potassium chloride 10 MEQ tablet  Commonly known as:  K-DUR,KLOR-CON  Take 10 mEq by mouth 2 (two) times daily. Take 4 tabs = 40 MEQ  PO BID     VESICARE 10 MG tablet  Generic drug:  solifenacin  Take 1 tablet by mouth daily. For bladder     warfarin 2.5 MG tablet  Commonly known as:  COUMADIN  Take 2.5 mg by mouth daily.         No Known Allergies   REVIEW OF SYSTEMS:  GENERAL: no change in appetite, no fatigue, no weight changes, no fever, chills or weakness RESPIRATORY: no cough, SOB, DOE, wheezing, hemoptysis CARDIAC: no chest pain, or palpitations GI: no abdominal pain, diarrhea, constipation, heart burn, nausea or vomiting  PHYSICAL EXAMINATION  GENERAL: no acute distress, obese SKIN:  Erythematous rashes under bilateral breast and groin EYES: conjunctivae normal, sclerae normal, normal eye lids NECK: supple, trachea midline, no neck masses, no thyroid  tenderness, no thyromegaly LYMPHATICS: no LAN in the neck, no supraclavicular LAN RESPIRATORY: breathing is even & unlabored, BS CTAB CARDIAC: Irregularly irregular, no murmur,no extra heart sounds, BLE edema 1+ GI: abdomen soft, normal BS, no masses, no tenderness, no hepatomegaly, no splenomegaly EXTREMITIES: Able to move 4 extremities; generalized weakness on BLE PSYCHIATRIC: the patient is alert & oriented to person, affect & behavior appropriate  LABS/RADIOLOGY: Labs reviewed: Basic Metabolic Panel:  Recent Labs  81/27/51 0740  07/17/14 2128 07/18/14 0706 07/19/14 0349 07/19/14 1243  NA 145  < > 140 141 137  --   K 3.7  < > 3.7 3.6 2.8* 3.3*  CL 111  < > 100 104 99  --   CO2 19  < > 30 24 29   --   GLUCOSE 88  < > 137* 98 93  --   BUN 38*  < > 24* 24* 26*  --   CREATININE 2.27*  < > 1.98* 1.89* 1.72*  --   CALCIUM 7.6*  < > 8.8 8.6 8.2*  --   MG 2.1  --  2.4  --   --   --   PHOS 3.1  --   --   --   --   --   < > = values in this interval not displayed. Liver Function Tests:  Recent Labs  11/30/13 0420 12/17/13 1529 07/18/14 0706  AST 15 18 17   ALT 8 8 10   ALKPHOS 67 92 80  BILITOT 0.4 0.8 0.7  PROT 6.1 7.8 6.8  ALBUMIN 2.2* 3.0* 2.7*   CBC:  Recent Labs  11/27/13 1108  07/17/14 2128 07/18/14 0706 07/18/14 1306 07/19/14 0349  WBC 12.3*  < > 7.1 5.4  --  4.5  NEUTROABS 10.9*  --  5.5  --   --   --   HGB 11.1*  < > 11.5* 10.1*  --  9.2*  HCT 37.1  < > 36.8 32.2* 33.5* 29.6*  MCV 98.9  < > 94.8 93.9  --  94.3  PLT 260  < > 336 274  --  293  < > = values in this interval not displayed.  Cardiac Enzymes:  Recent Labs  07/18/14 0245 07/18/14 0706 07/18/14 1306  TROPONINI <0.03 <0.03 <0.03     ASSESSMENT/PLAN:  Hypertension - continue metoprolol 25 mg 1/2 tab = 12.5 mg by mouth twice a day  Chronic kidney disease stage IV  - currently on Lasix 160 mg by  mouth twice a day; check CMP and CBC  Chronic atrial fibrillation - rate controlled;  continue Coumadin and metoprolol 12.5 mg by mouth twice a day  Long-term use of anticoagulation - INR 1.5; subtherapeutic; increase Coumadin to 2.5 mg 1 tab by mouth daily; check INR on 11/07/14  Hypothyroidism - continue Synthroid 50 g 1 tab by mouth daily; check TSH  Chronic systolic CHF - continue Lasix 80 mg 2 tabs = 160 mg by mouth twice a day; weigh every Mondays and  Wednesdays  Allergic rhinitis - continue best loratadine 5 mg 1 tab by mouth daily and Flonase 50 g/ACT 1 spray to each nostril daily at bedtime  Hypokalemia - continue Klor-Con 10 MEQ take 4 tabs = 40 meq by mouth twice a day  Overactive bladder - continue Vesicare 10 mg 1 tab by mouth daily  Candida, skin - apply nystatin powder under bilateral breasts and groin rashes twice a day 3 weeks  Depression - mood this is stable; continue Cymbalta 20 mg 1 by mouth daily    Goals of care:  Short-term rehabilitation     [facility], NP [facility] [phone]

## 2014-11-07 ENCOUNTER — Non-Acute Institutional Stay (SKILLED_NURSING_FACILITY): Payer: Medicare Other | Admitting: Internal Medicine

## 2014-11-07 DIAGNOSIS — I5022 Chronic systolic (congestive) heart failure: Secondary | ICD-10-CM | POA: Diagnosis not present

## 2014-11-07 DIAGNOSIS — R531 Weakness: Secondary | ICD-10-CM | POA: Diagnosis not present

## 2014-11-07 DIAGNOSIS — R238 Other skin changes: Secondary | ICD-10-CM

## 2014-11-07 DIAGNOSIS — F322 Major depressive disorder, single episode, severe without psychotic features: Secondary | ICD-10-CM | POA: Diagnosis not present

## 2014-11-07 DIAGNOSIS — I10 Essential (primary) hypertension: Secondary | ICD-10-CM

## 2014-11-07 DIAGNOSIS — F329 Major depressive disorder, single episode, unspecified: Secondary | ICD-10-CM

## 2014-11-07 DIAGNOSIS — R239 Unspecified skin changes: Secondary | ICD-10-CM

## 2014-11-07 DIAGNOSIS — N184 Chronic kidney disease, stage 4 (severe): Secondary | ICD-10-CM

## 2014-11-07 DIAGNOSIS — Z7901 Long term (current) use of anticoagulants: Secondary | ICD-10-CM | POA: Diagnosis not present

## 2014-11-07 DIAGNOSIS — I482 Chronic atrial fibrillation, unspecified: Secondary | ICD-10-CM

## 2014-11-07 DIAGNOSIS — E876 Hypokalemia: Secondary | ICD-10-CM | POA: Diagnosis not present

## 2014-11-07 DIAGNOSIS — R32 Unspecified urinary incontinence: Secondary | ICD-10-CM | POA: Diagnosis not present

## 2014-11-07 DIAGNOSIS — J309 Allergic rhinitis, unspecified: Secondary | ICD-10-CM

## 2014-11-07 DIAGNOSIS — E039 Hypothyroidism, unspecified: Secondary | ICD-10-CM | POA: Diagnosis not present

## 2014-11-07 DIAGNOSIS — T502X5A Adverse effect of carbonic-anhydrase inhibitors, benzothiadiazides and other diuretics, initial encounter: Secondary | ICD-10-CM

## 2014-11-07 DIAGNOSIS — E669 Obesity, unspecified: Secondary | ICD-10-CM | POA: Diagnosis not present

## 2014-11-07 LAB — BASIC METABOLIC PANEL
BUN: 34 mg/dL — AB (ref 4–21)
Creatinine: 1.9 mg/dL — AB (ref 0.5–1.1)
Glucose: 96 mg/dL
Potassium: 3.9 mmol/L (ref 3.4–5.3)
SODIUM: 143 mmol/L (ref 137–147)

## 2014-11-07 LAB — HEPATIC FUNCTION PANEL
ALK PHOS: 75 U/L (ref 25–125)
ALT: 10 U/L (ref 7–35)
AST: 15 U/L (ref 13–35)
BILIRUBIN, TOTAL: 0.7 mg/dL

## 2014-11-07 LAB — CBC AND DIFFERENTIAL
HEMATOCRIT: 32 % — AB (ref 36–46)
HEMOGLOBIN: 10.1 g/dL — AB (ref 12.0–16.0)
PLATELETS: 239 10*3/uL (ref 150–399)
WBC: 4.3 10^3/mL

## 2014-11-07 LAB — TSH: TSH: 3.95 u[IU]/mL (ref 0.41–5.90)

## 2014-11-07 NOTE — Progress Notes (Signed)
Patient ID: Margaret [name], female   DOB: 11/02/1940, 74 y.o.   MRN: [id number]      [name]   PCP: Allean Found, MD  Code Status: full code  No Known Allergies  Chief Complaint  Patient presents with  . New Admit To SNF     HPI:  74 y.o. patient is here for long term care from home. She has PMH of systolic CHF with EF 35-40%, afib on coumadin, MVP, AICD, TIA, ckd stage 4. She is seen in her room today with her husband at bedside. She is wheelchair bound with lower extremity weakness and has to be wheeled around. She has urinary incontinence. She can communicate her needs, feed her self. She denies any concerns today. She refused to work with therapy team this am for unclear reason, she mentions she did not feel like.  Review of Systems:  Constitutional: Negative for fever, chills, diaphoresis.  HENT: Negative for headache, congestion, nasal discharge Eyes: Negative for eye pain, blurred vision, double vision and discharge.  Respiratory: Negative for cough, shortness of breath and wheezing.   Cardiovascular: Negative for chest pain, palpitations, leg swelling.  Gastrointestinal: Negative for heartburn, nausea, vomiting, abdominal pain. Appetite is good Genitourinary: Negative for dysuria. Has urinary incontinence  Musculoskeletal: Negative for back pain, falls Skin: Negative for itching. Has rash underneath her breasts.  Neurological: Negative for dizziness, tingling Psychiatric/Behavioral: Negative for depression   Past Medical History  Diagnosis Date  . NICM (nonischemic cardiomyopathy)     EF 35%; cath 2/12 no CAD  . Systolic CHF, chronic   . Severe mitral regurgitation     s/p complex mitral valve repair with cox maze + LAA clipping, removal of RV lead, and implantation of CRT-D in abdomen  . Cardiac arrest - ventricular fibrillation     in 1990s  . Atrial fibrillation     amiodarone  . HTN (hypertension)   . HLD  (hyperlipidemia)   . Achalasia     s/p dilation  . Recurrent UTI   . Obesity   . Anoxic brain injury 1996    s/p cardiac arrest   . Automatic implantable cardioverter-defibrillator in situ   . Hypothyroidism   . TIA (transient ischemic attack)   . Chronic kidney disease (CKD), stage IV (severe)     [name] 10/03/2013   Past Surgical History  Procedure Laterality Date  . Defibrillator generator explantation and reimplantation; and  08/02/2001  . Device migration with anticipated pocket revision  10/29/2003  . 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 Attempted implantation of an implantable cardioverter-  12/30/2004  . Cardioversion  04/14/2010  . Median sternotomy  07/16/2010  . Mitral valve repair  07/16/2010  . Cox maze procedure (complete biatrial lesion set with clipping of  07/16/2010  . Removal of old endocardial right ventricular defibrillator lead.  07/16/2010  . Placement of dual chamber pacemaker and implantable cardiac  07/16/2010  . Placement of swan ganz pulmonary artery catheter via left femoral access.  07/16/2010  . Laparoscopic cholecystectomy    . Cataract extraction, bilateral    . Cystoscopy w/ stone manipulation    . Cataract extraction, bilateral Bilateral    Social History:   reports that she has never smoked. She has never used smokeless tobacco. She reports that she does not drink alcohol or use illicit drugs.  Family History  Problem Relation Age of Onset  . Stroke Mother   .  Lung cancer Father   . Diabetes Mellitus II Brother     Medications:   Medication List       This list is accurate as of: 11/07/14 10:45 AM.  Always use your most recent med list.               clobetasol ointment 0.05 %  Commonly known as:  TEMOVATE  Apply 1 application topically every morning. For rt leg, abdomen, and under breast.     desloratadine 5 MG tablet  Commonly known as:  CLARINEX  Take 5 mg by mouth every morning.  For allergies     DULoxetine 20 MG capsule  Commonly known as:  CYMBALTA  Take 20 mg by mouth daily. For depression     fluticasone 50 MCG/ACT nasal spray  Commonly known as:  FLONASE  Place 1 spray into both nostrils at bedtime. For allergies     folic acid 1 MG tablet  Commonly known as:  FOLVITE  Take 1 tablet by mouth daily.     furosemide 80 MG tablet  Commonly known as:  LASIX  Take 80 mg by mouth 2 (two) times daily. Take 2 tabs = 160 mg PO BID     levothyroxine 50 MCG tablet  Commonly known as:  SYNTHROID, LEVOTHROID  Take 50 mcg by mouth daily before breakfast.     metoprolol tartrate 25 MG tablet  Commonly known as:  LOPRESSOR  Take 12.5 mg by mouth 2 (two) times daily.     NIGHT-TIME SLEEP AID PO  Take 1 tablet by mouth at bedtime.     ondansetron 4 MG tablet  Commonly known as:  ZOFRAN  Take 4 mg by mouth every 6 (six) hours as needed for nausea or vomiting.     potassium chloride 10 MEQ tablet  Commonly known as:  K-DUR,KLOR-CON  Take 10 mEq by mouth 2 (two) times daily. Take 4 tabs = 40 MEQ  PO BID     VESICARE 10 MG tablet  Generic drug:  solifenacin  Take 1 tablet by mouth daily. For bladder     warfarin 2.5 MG tablet  Commonly known as:  COUMADIN  Take 2.5 mg by mouth daily.         Physical Exam: Filed Vitals:   11/07/14 1044  BP: 131/61  Pulse: 77  Temp: 98.9 F (37.2 C)  Resp: 18  Weight: 196 lb 12.8 oz (89.268 kg)  SpO2: 95%    General- elderly female, obese, in no acute distress Head- normocephalic, atraumatic Nose- normal nasal mucosa, no maxillary or frontal sinus tenderness, no nasal discharge Throat- moist mucus membrane, poor dentition  Eyes- PERRLA, EOMI, no pallor, no icterus, no discharge, normal conjunctiva, normal sclera Neck- no cervical lymphadenopathy, no thyromegaly, no jugular vein distension Chest- no chest wall deformities, no chest wall tenderness Cardiovascular- normal s1,s2, no murmurs, palpable dorsalis  pedis and radial pulses, trace leg edema Respiratory- bilateral clear to auscultation, no wheeze, no rhonchi, no crackles, no use of accessory muscles Abdomen- bowel sounds present, soft, non tender Musculoskeletal- able to move all 4 extremities, generalized weakness lower extremity > upper extremity Neurological- no focal deficit, alert and oriented to person, place and time Skin- warm and dry, redness underneath both breast fold Psychiatry- normal mood and affect    Labs reviewed: Basic Metabolic Panel:  Recent Labs  38/17/71 0740  07/17/14 2128 07/18/14 0706 07/19/14 0349 07/19/14 1243  NA 145  < > 140 141 137  --  K 3.7  < > 3.7 3.6 2.8* 3.3*  CL 111  < > 100 104 99  --   CO2 19  < > --   GLUCOSE 88  < > 137* 98 93  --   BUN 38*  < > 24* 24* 26*  --   CREATININE 2.27*  < > 1.98* 1.89* 1.72*  --   CALCIUM 7.6*  < > 8.8 8.6 8.2*  --   MG 2.1  --  2.4  --   --   --   PHOS 3.1  --   --   --   --   --   < > = values in this interval not displayed. Liver Function Tests:  Recent Labs  11/30/13 0420 12/17/13 1529 07/18/14 0706  AST ALT ALKPHOS 67 92 80  BILITOT 0.4 0.8 0.7  PROT 6.1 7.8 6.8  ALBUMIN 2.2* 3.0* 2.7*   No results for input(s): LIPASE, AMYLASE in the last 8760 hours. No results for input(s): AMMONIA in the last 8760 hours. CBC:  Recent Labs  11/27/13 1108  07/17/14 2128 07/18/14 0706 07/18/14 1306 07/19/14 0349  WBC 12.3*  < > 7.1 5.4  --  4.5  NEUTROABS 10.9*  --  5.5  --   --   --   HGB 11.1*  < > 11.5* 10.1*  --  9.2*  HCT 37.1  < > 36.8 32.2* 33.5* 29.6*  MCV 98.9  < > 94.8 93.9  --  94.3  PLT 260  < > 336 274  --  293  < > = values in this interval not displayed. Cardiac Enzymes:  Recent Labs  07/18/14 0245 07/18/14 0706 07/18/14 1306  TROPONINI <0.03 <0.03 <0.03    Assessment/Plan  Generalized weakness From physical deconditioning. Will have her work with physical therapy and occupational therapy  team to help with gait training and muscle strengthening exercises.fall precautions. Skin care. Encourage to be out of bed.   Hypertension Stable bp, continue metoprolol 12.5 mg bid, monitor bp  afib Rate controlled. Continue metoprolol 12.5 mg bid with coumadin with goal inr 2-3  Chronic systolic chf Appears euvolemic on exam. Continue metoprolol 12.5 mg bid, lasix 160 mg bid, monitor weight, continue kcl supplement. S/p AICD and pacemaker. Continue follow up with cardiology Dr Donnie Aho.  Chronic kidney disease stage IV On lasix 160 mg bid, monitor bmp  Obesity Diet control with calorie counting required, encouraged to be OOB and to participate in group activities. Check a1c and lipid panel with vitamin d. Consider calcium vitamin d supplement  Hypothyroidism Continue levothyroxine 50 mcg daily, check tsh  Alteration in skin integrity with moisture On breast fold with moisture. Continue clobetasol ointment and d/c nystatin powder for now. Continue skin care.  Urinary incontinence Continue vesicare 10 mg daily and perineal hygiene. Had foley catheter will family having trouble with frequent diaper change. Voiding good. Defer foley for now.   Hypokalemia Iatrogenic with her on lasix, continue kcl supplement 40 meq bid and monitor bmp  Chronic depression Stable on cymbalta 20 mg daily, monitor mood  Long term anticoagulation With coumadin, goal inr 2-3 with afib, monitor inr  Allergic rhinitis continue loratadine 5 mg daily and Flonase nasal spray and monitor   Goals of care: short term rehabilitation   Labs/tests ordered: cbc, cmp, a1c, tsh, lipid panel, vit d  Family/ staff Communication: reviewed care plan with patient and nursing supervisor  Blanchie Serve, MD  [facility] [phone] (Monday-Friday 8 am - 5 pm) [phone] (afterhours)

## 2014-12-04 ENCOUNTER — Non-Acute Institutional Stay (SKILLED_NURSING_FACILITY): Payer: Medicare Other | Admitting: Adult Health

## 2014-12-04 ENCOUNTER — Encounter: Payer: Self-pay | Admitting: Adult Health

## 2014-12-04 DIAGNOSIS — I482 Chronic atrial fibrillation, unspecified: Secondary | ICD-10-CM

## 2014-12-04 DIAGNOSIS — Z7901 Long term (current) use of anticoagulants: Secondary | ICD-10-CM | POA: Diagnosis not present

## 2014-12-07 NOTE — Progress Notes (Signed)
Patient ID: YUMEKA VALENTINO, female   DOB: [birth year], 74 y.o.   MRN: [id number] Subjective:     Indication: atrial fibrillation Bleeding signs/symptoms: None Thromboembolic signs/symptoms: None  Missed Coumadin doses: 2 due to supratherapeutic INR Medication changes: no Dietary changes: no Bacterial/viral infection: no Other concerns: no  Review of Systems A comprehensive review of systems was negative.   Objective:    INR Today: 2.3 Current dose:   held X 2 days and last dose taken 3.5 mg  Assessment:    Therapeutic INR for goal of 2-3   Plan:    1. New dose: decrease Coumadin to 2.5 mg daily   2. Next INR:   12/09/14

## 2014-12-09 ENCOUNTER — Encounter: Payer: Self-pay | Admitting: Adult Health

## 2014-12-09 ENCOUNTER — Non-Acute Institutional Stay (SKILLED_NURSING_FACILITY): Payer: Medicare Other | Admitting: Adult Health

## 2014-12-09 DIAGNOSIS — I482 Chronic atrial fibrillation, unspecified: Secondary | ICD-10-CM

## 2014-12-09 DIAGNOSIS — Z7901 Long term (current) use of anticoagulants: Secondary | ICD-10-CM

## 2014-12-09 NOTE — Progress Notes (Signed)
Patient ID: Margaret [name], female   DOB: 07-Mar-1941, 74 y.o.   MRN: [id number] Subjective:     Indication: atrial fibrillation Bleeding signs/symptoms: None Thromboembolic signs/symptoms: None  Missed Coumadin doses: None Medication changes: no Dietary changes: no Bacterial/viral infection: no Other concerns: no     Review of Systems A comprehensive review of systems was negative.   Objective:    INR Today: 1.8 Current dose:  Coumadin 2.5 mg  Assessment:    Subtherapeutic INR for goal of 2-3   Plan:    1. New dose: increase Coumadin to  3mg  PO Q D   2. Next INR:   12/12/14

## 2014-12-12 ENCOUNTER — Encounter: Payer: Self-pay | Admitting: Adult Health

## 2014-12-12 ENCOUNTER — Non-Acute Institutional Stay (SKILLED_NURSING_FACILITY): Payer: Medicare Other | Admitting: Adult Health

## 2014-12-12 DIAGNOSIS — R32 Unspecified urinary incontinence: Secondary | ICD-10-CM | POA: Diagnosis not present

## 2014-12-12 DIAGNOSIS — I482 Chronic atrial fibrillation, unspecified: Secondary | ICD-10-CM

## 2014-12-12 DIAGNOSIS — E876 Hypokalemia: Secondary | ICD-10-CM | POA: Diagnosis not present

## 2014-12-12 DIAGNOSIS — I10 Essential (primary) hypertension: Secondary | ICD-10-CM

## 2014-12-12 DIAGNOSIS — N3281 Overactive bladder: Secondary | ICD-10-CM | POA: Diagnosis not present

## 2014-12-12 DIAGNOSIS — F32A Depression, unspecified: Secondary | ICD-10-CM

## 2014-12-12 DIAGNOSIS — N184 Chronic kidney disease, stage 4 (severe): Secondary | ICD-10-CM

## 2014-12-12 DIAGNOSIS — F329 Major depressive disorder, single episode, unspecified: Secondary | ICD-10-CM

## 2014-12-12 DIAGNOSIS — I5022 Chronic systolic (congestive) heart failure: Secondary | ICD-10-CM

## 2014-12-12 DIAGNOSIS — L8990 Pressure ulcer of unspecified site, unspecified stage: Secondary | ICD-10-CM

## 2014-12-12 DIAGNOSIS — E039 Hypothyroidism, unspecified: Secondary | ICD-10-CM

## 2014-12-12 DIAGNOSIS — J309 Allergic rhinitis, unspecified: Secondary | ICD-10-CM

## 2014-12-14 ENCOUNTER — Encounter (HOSPITAL_COMMUNITY): Payer: Self-pay | Admitting: Emergency Medicine

## 2014-12-14 ENCOUNTER — Inpatient Hospital Stay (HOSPITAL_COMMUNITY)
Admission: EM | Admit: 2014-12-14 | Discharge: 2014-12-18 | DRG: 872 | Disposition: A | Discharge status: Skilled Nursing Facility | Payer: Medicare Other | Attending: Internal Medicine | Admitting: Internal Medicine

## 2014-12-14 ENCOUNTER — Emergency Department (HOSPITAL_COMMUNITY): Payer: Medicare Other

## 2014-12-14 DIAGNOSIS — I5022 Chronic systolic (congestive) heart failure: Secondary | ICD-10-CM | POA: Diagnosis present

## 2014-12-14 DIAGNOSIS — F329 Major depressive disorder, single episode, unspecified: Secondary | ICD-10-CM

## 2014-12-14 DIAGNOSIS — E038 Other specified hypothyroidism: Secondary | ICD-10-CM | POA: Diagnosis present

## 2014-12-14 DIAGNOSIS — I4891 Unspecified atrial fibrillation: Secondary | ICD-10-CM | POA: Diagnosis present

## 2014-12-14 DIAGNOSIS — M1990 Unspecified osteoarthritis, unspecified site: Secondary | ICD-10-CM | POA: Diagnosis present

## 2014-12-14 DIAGNOSIS — L8990 Pressure ulcer of unspecified site, unspecified stage: Secondary | ICD-10-CM | POA: Insufficient documentation

## 2014-12-14 DIAGNOSIS — E669 Obesity, unspecified: Secondary | ICD-10-CM | POA: Diagnosis present

## 2014-12-14 DIAGNOSIS — A419 Sepsis, unspecified organism: Secondary | ICD-10-CM | POA: Diagnosis present

## 2014-12-14 DIAGNOSIS — Z95810 Presence of automatic (implantable) cardiac defibrillator: Secondary | ICD-10-CM | POA: Diagnosis not present

## 2014-12-14 DIAGNOSIS — F328 Other depressive episodes: Secondary | ICD-10-CM | POA: Diagnosis present

## 2014-12-14 DIAGNOSIS — B9620 Unspecified Escherichia coli [E. coli] as the cause of diseases classified elsewhere: Secondary | ICD-10-CM | POA: Diagnosis present

## 2014-12-14 DIAGNOSIS — R110 Nausea: Secondary | ICD-10-CM | POA: Diagnosis present

## 2014-12-14 DIAGNOSIS — A4151 Sepsis due to Escherichia coli [E. coli]: Secondary | ICD-10-CM | POA: Diagnosis present

## 2014-12-14 DIAGNOSIS — R32 Unspecified urinary incontinence: Secondary | ICD-10-CM | POA: Diagnosis present

## 2014-12-14 DIAGNOSIS — I130 Hypertensive heart and chronic kidney disease with heart failure and stage 1 through stage 4 chronic kidney disease, or unspecified chronic kidney disease: Secondary | ICD-10-CM

## 2014-12-14 DIAGNOSIS — I482 Chronic atrial fibrillation, unspecified: Secondary | ICD-10-CM | POA: Diagnosis present

## 2014-12-14 DIAGNOSIS — N185 Chronic kidney disease, stage 5: Secondary | ICD-10-CM | POA: Diagnosis present

## 2014-12-14 DIAGNOSIS — I429 Cardiomyopathy, unspecified: Secondary | ICD-10-CM | POA: Diagnosis present

## 2014-12-14 DIAGNOSIS — Z7901 Long term (current) use of anticoagulants: Secondary | ICD-10-CM | POA: Diagnosis not present

## 2014-12-14 DIAGNOSIS — N390 Urinary tract infection, site not specified: Secondary | ICD-10-CM | POA: Diagnosis present

## 2014-12-14 DIAGNOSIS — I248 Other forms of acute ischemic heart disease: Secondary | ICD-10-CM | POA: Diagnosis present

## 2014-12-14 DIAGNOSIS — Z8673 Personal history of transient ischemic attack (TIA), and cerebral infarction without residual deficits: Secondary | ICD-10-CM

## 2014-12-14 DIAGNOSIS — I878 Other specified disorders of veins: Secondary | ICD-10-CM | POA: Diagnosis present

## 2014-12-14 DIAGNOSIS — I428 Other cardiomyopathies: Secondary | ICD-10-CM | POA: Diagnosis present

## 2014-12-14 DIAGNOSIS — R8290 Unspecified abnormal findings in urine: Secondary | ICD-10-CM

## 2014-12-14 DIAGNOSIS — E785 Hyperlipidemia, unspecified: Secondary | ICD-10-CM | POA: Diagnosis present

## 2014-12-14 DIAGNOSIS — N3281 Overactive bladder: Secondary | ICD-10-CM | POA: Diagnosis present

## 2014-12-14 DIAGNOSIS — Z8674 Personal history of sudden cardiac arrest: Secondary | ICD-10-CM

## 2014-12-14 DIAGNOSIS — I120 Hypertensive chronic kidney disease with stage 5 chronic kidney disease or end stage renal disease: Secondary | ICD-10-CM | POA: Diagnosis present

## 2014-12-14 DIAGNOSIS — R0989 Other specified symptoms and signs involving the circulatory and respiratory systems: Secondary | ICD-10-CM

## 2014-12-14 DIAGNOSIS — Z0189 Encounter for other specified special examinations: Secondary | ICD-10-CM

## 2014-12-14 DIAGNOSIS — R7881 Bacteremia: Secondary | ICD-10-CM | POA: Diagnosis not present

## 2014-12-14 DIAGNOSIS — R9431 Abnormal electrocardiogram [ECG] [EKG]: Secondary | ICD-10-CM

## 2014-12-14 DIAGNOSIS — Z6834 Body mass index (BMI) 34.0-34.9, adult: Secondary | ICD-10-CM

## 2014-12-14 DIAGNOSIS — E876 Hypokalemia: Secondary | ICD-10-CM | POA: Diagnosis not present

## 2014-12-14 DIAGNOSIS — E039 Hypothyroidism, unspecified: Secondary | ICD-10-CM | POA: Diagnosis present

## 2014-12-14 DIAGNOSIS — N179 Acute kidney failure, unspecified: Secondary | ICD-10-CM | POA: Diagnosis present

## 2014-12-14 DIAGNOSIS — N183 Chronic kidney disease, stage 3 unspecified: Secondary | ICD-10-CM | POA: Diagnosis present

## 2014-12-14 DIAGNOSIS — I509 Heart failure, unspecified: Secondary | ICD-10-CM

## 2014-12-14 DIAGNOSIS — I10 Essential (primary) hypertension: Secondary | ICD-10-CM | POA: Diagnosis present

## 2014-12-14 DIAGNOSIS — F3289 Other specified depressive episodes: Secondary | ICD-10-CM | POA: Diagnosis present

## 2014-12-14 LAB — MRSA PCR SCREENING: MRSA by PCR: POSITIVE — AB

## 2014-12-14 LAB — CBC WITH DIFFERENTIAL/PLATELET
Basophils Absolute: 0 K/uL (ref 0.0–0.1)
Basophils Relative: 0 % (ref 0–1)
Eosinophils Absolute: 0 K/uL (ref 0.0–0.7)
Eosinophils Relative: 0 % (ref 0–5)
HCT: 31.6 % — ABNORMAL LOW (ref 36.0–46.0)
Hemoglobin: 9.8 g/dL — ABNORMAL LOW (ref 12.0–15.0)
Lymphocytes Relative: 4 % — ABNORMAL LOW (ref 12–46)
Lymphs Abs: 0.3 K/uL — ABNORMAL LOW (ref 0.7–4.0)
MCH: 28.5 pg (ref 26.0–34.0)
MCHC: 31 g/dL (ref 30.0–36.0)
MCV: 91.9 fL (ref 78.0–100.0)
Monocytes Absolute: 0.4 K/uL (ref 0.1–1.0)
Monocytes Relative: 5 % (ref 3–12)
Neutro Abs: 7.2 K/uL (ref 1.7–7.7)
Neutrophils Relative %: 91 % — ABNORMAL HIGH (ref 43–77)
Platelets: 155 K/uL (ref 150–400)
RBC: 3.44 MIL/uL — ABNORMAL LOW (ref 3.87–5.11)
RDW: 15.9 % — ABNORMAL HIGH (ref 11.5–15.5)
WBC: 7.9 K/uL (ref 4.0–10.5)

## 2014-12-14 LAB — URINALYSIS, ROUTINE W REFLEX MICROSCOPIC
Bilirubin Urine: NEGATIVE
Glucose, UA: NEGATIVE mg/dL
Ketones, ur: NEGATIVE mg/dL
Nitrite: POSITIVE — AB
Protein, ur: 100 mg/dL — AB
Specific Gravity, Urine: 1.01 (ref 1.005–1.030)
Urobilinogen, UA: 4 mg/dL — ABNORMAL HIGH (ref 0.0–1.0)
pH: 6.5 (ref 5.0–8.0)

## 2014-12-14 LAB — I-STAT CG4 LACTIC ACID, ED
LACTIC ACID, VENOUS: 0.96 mmol/L (ref 0.5–2.0)
Lactic Acid, Venous: 2.33 mmol/L (ref 0.5–2.0)

## 2014-12-14 LAB — HEPATIC FUNCTION PANEL
ALBUMIN: 2.3 g/dL — AB (ref 3.5–5.0)
ALT: 18 U/L (ref 14–54)
AST: 25 U/L (ref 15–41)
Alkaline Phosphatase: 76 U/L (ref 38–126)
BILIRUBIN DIRECT: 0.4 mg/dL (ref 0.1–0.5)
Indirect Bilirubin: 1.5 mg/dL — ABNORMAL HIGH (ref 0.3–0.9)
TOTAL PROTEIN: 6.1 g/dL — AB (ref 6.5–8.1)
Total Bilirubin: 1.9 mg/dL — ABNORMAL HIGH (ref 0.3–1.2)

## 2014-12-14 LAB — BASIC METABOLIC PANEL
Anion gap: 12 (ref 5–15)
BUN: 41 mg/dL — AB (ref 6–20)
CHLORIDE: 99 mmol/L — AB (ref 101–111)
CO2: 26 mmol/L (ref 22–32)
CREATININE: 2.6 mg/dL — AB (ref 0.44–1.00)
Calcium: 8.1 mg/dL — ABNORMAL LOW (ref 8.9–10.3)
GFR calc Af Amer: 20 mL/min — ABNORMAL LOW (ref >60)
GFR calc non Af Amer: 17 mL/min — ABNORMAL LOW (ref >60)
GLUCOSE: 144 mg/dL — AB (ref 65–99)
POTASSIUM: 3.8 mmol/L (ref 3.5–5.1)
Sodium: 137 mmol/L (ref 135–145)

## 2014-12-14 LAB — PROTIME-INR
INR: 1.85 — ABNORMAL HIGH (ref 0.00–1.49)
Prothrombin Time: 21.3 seconds — ABNORMAL HIGH (ref 11.6–15.2)

## 2014-12-14 LAB — URINE MICROSCOPIC-ADD ON

## 2014-12-14 LAB — TROPONIN I: Troponin I: 0.06 ng/mL — ABNORMAL HIGH (ref <0.031)

## 2014-12-14 LAB — APTT: APTT: 38 s — AB (ref 24–37)

## 2014-12-14 LAB — LACTIC ACID, PLASMA: LACTIC ACID, VENOUS: 1.2 mmol/L (ref 0.5–2.0)

## 2014-12-14 LAB — PROCALCITONIN: PROCALCITONIN: 32.25 ng/mL

## 2014-12-14 MED ORDER — FOLIC ACID 1 MG PO TABS
1.0000 mg | ORAL_TABLET | Freq: Every day | ORAL | Status: DC
  Administered 2014-12-15 – 2014-12-18 (×4): 1 mg via ORAL
  Filled 2014-12-14 (×6): qty 1

## 2014-12-14 MED ORDER — VANCOMYCIN HCL IN DEXTROSE 1-5 GM/200ML-% IV SOLN
1000.0000 mg | INTRAVENOUS | Status: DC
  Administered 2014-12-15: 1000 mg via INTRAVENOUS
  Filled 2014-12-14 (×2): qty 200

## 2014-12-14 MED ORDER — MUPIROCIN 2 % EX OINT
1.0000 "application " | TOPICAL_OINTMENT | Freq: Two times a day (BID) | CUTANEOUS | Status: DC
  Administered 2014-12-14 – 2014-12-18 (×8): 1 via NASAL
  Filled 2014-12-14 (×2): qty 22

## 2014-12-14 MED ORDER — LORATADINE 10 MG PO TABS
10.0000 mg | ORAL_TABLET | Freq: Every day | ORAL | Status: DC
  Administered 2014-12-15 – 2014-12-18 (×4): 10 mg via ORAL
  Filled 2014-12-14 (×6): qty 1

## 2014-12-14 MED ORDER — SODIUM CHLORIDE 0.9 % IV SOLN
INTRAVENOUS | Status: DC
  Administered 2014-12-14 – 2014-12-16 (×5): via INTRAVENOUS

## 2014-12-14 MED ORDER — PIPERACILLIN-TAZOBACTAM 3.375 G IVPB 30 MIN
3.3750 g | Freq: Once | INTRAVENOUS | Status: AC
  Administered 2014-12-14: 3.375 g via INTRAVENOUS
  Filled 2014-12-14: qty 50

## 2014-12-14 MED ORDER — SENNOSIDES-DOCUSATE SODIUM 8.6-50 MG PO TABS
1.0000 | ORAL_TABLET | Freq: Every evening | ORAL | Status: DC | PRN
Start: 2014-12-14 — End: 2014-12-18

## 2014-12-14 MED ORDER — ALUM & MAG HYDROXIDE-SIMETH 200-200-20 MG/5ML PO SUSP: 30.0000 mL | Freq: Four times a day (QID) | ORAL | Status: DC | PRN

## 2014-12-14 MED ORDER — PIPERACILLIN-TAZOBACTAM IN DEX 2-0.25 GM/50ML IV SOLN
2.2500 g | Freq: Three times a day (TID) | INTRAVENOUS | Status: DC
  Administered 2014-12-14 – 2014-12-16 (×5): 2.25 g via INTRAVENOUS
  Filled 2014-12-14 (×9): qty 50

## 2014-12-14 MED ORDER — SODIUM CHLORIDE 0.9 % IV BOLUS (SEPSIS)
2800.0000 mL | Freq: Once | INTRAVENOUS | Status: AC
  Administered 2014-12-14: 2800 mL via INTRAVENOUS

## 2014-12-14 MED ORDER — VANCOMYCIN HCL IN DEXTROSE 1-5 GM/200ML-% IV SOLN
1000.0000 mg | Freq: Once | INTRAVENOUS | Status: AC
  Administered 2014-12-14: 1000 mg via INTRAVENOUS
  Filled 2014-12-14: qty 200

## 2014-12-14 MED ORDER — SODIUM CHLORIDE 0.9 % IV SOLN
INTRAVENOUS | Status: DC
Start: 2014-12-14 — End: 2014-12-14

## 2014-12-14 MED ORDER — LEVOTHYROXINE SODIUM 50 MCG PO TABS
50.0000 ug | ORAL_TABLET | Freq: Every day | ORAL | Status: DC
Start: 2014-12-15 — End: 2014-12-18
  Administered 2014-12-15 – 2014-12-18 (×4): 50 ug via ORAL
  Filled 2014-12-14 (×6): qty 1

## 2014-12-14 MED ORDER — VANCOMYCIN HCL IN DEXTROSE 750-5 MG/150ML-% IV SOLN
750.0000 mg | Freq: Two times a day (BID) | INTRAVENOUS | Status: DC
  Filled 2014-12-14: qty 150

## 2014-12-14 MED ORDER — PIPERACILLIN-TAZOBACTAM 3.375 G IVPB: 3.3750 g | Freq: Three times a day (TID) | INTRAVENOUS | Status: DC

## 2014-12-14 MED ORDER — ACETAMINOPHEN 325 MG PO TABS
650.0000 mg | ORAL_TABLET | Freq: Once | ORAL | Status: AC
  Administered 2014-12-14: 650 mg via ORAL
  Filled 2014-12-14: qty 2

## 2014-12-14 MED ORDER — WARFARIN SODIUM 3 MG PO TABS
3.0000 mg | ORAL_TABLET | Freq: Every day | ORAL | Status: DC
  Administered 2014-12-14 – 2014-12-17 (×4): 3 mg via ORAL
  Filled 2014-12-14 (×5): qty 1

## 2014-12-14 MED ORDER — FLUTICASONE PROPIONATE 50 MCG/ACT NA SUSP
2.0000 | Freq: Every day | NASAL | Status: DC
  Administered 2014-12-14 – 2014-12-17 (×4): 2 via NASAL
  Filled 2014-12-14: qty 16

## 2014-12-14 MED ORDER — WARFARIN - PHYSICIAN DOSING INPATIENT
Freq: Every day | Status: DC
  Administered 2014-12-15 – 2014-12-17 (×3)

## 2014-12-14 MED ORDER — ONDANSETRON HCL 4 MG PO TABS: 4.0000 mg | ORAL_TABLET | Freq: Four times a day (QID) | ORAL | Status: DC | PRN

## 2014-12-14 MED ORDER — SODIUM CHLORIDE 0.9 % IJ SOLN
3.0000 mL | Freq: Two times a day (BID) | INTRAMUSCULAR | Status: DC
  Administered 2014-12-14 – 2014-12-15 (×2): 3 mL via INTRAVENOUS

## 2014-12-14 MED ORDER — ONDANSETRON HCL 4 MG/2ML IJ SOLN: 4.0000 mg | Freq: Three times a day (TID) | INTRAMUSCULAR | Status: DC | PRN

## 2014-12-14 MED ORDER — MIRABEGRON ER 50 MG PO TB24
50.0000 mg | ORAL_TABLET | Freq: Every day | ORAL | Status: DC
  Administered 2014-12-15 – 2014-12-18 (×4): 50 mg via ORAL
  Filled 2014-12-14 (×5): qty 1

## 2014-12-14 MED ORDER — ACETAMINOPHEN 325 MG PO TABS: 650.0000 mg | ORAL_TABLET | Freq: Four times a day (QID) | ORAL | Status: DC | PRN

## 2014-12-14 MED ORDER — DULOXETINE HCL 20 MG PO CPEP
20.0000 mg | ORAL_CAPSULE | Freq: Every day | ORAL | Status: DC
  Administered 2014-12-15 – 2014-12-16 (×2): 20 mg via ORAL
  Filled 2014-12-14 (×3): qty 1

## 2014-12-14 MED ORDER — CHLORHEXIDINE GLUCONATE CLOTH 2 % EX PADS
6.0000 | MEDICATED_PAD | Freq: Every day | CUTANEOUS | Status: DC
  Administered 2014-12-15 – 2014-12-18 (×4): 6 via TOPICAL

## 2014-12-14 MED ORDER — OXYCODONE HCL 5 MG PO TABS: 5.0000 mg | ORAL_TABLET | ORAL | Status: DC | PRN

## 2014-12-14 NOTE — ED Notes (Signed)
Pt from [facility]. Complaining of n/v since Friday. Pt has developed some tremors over the past 3 days. EKG show ST 126 , BP 116/82, temp 102.2  EMS gave zofran IM from EMS. Pt has been receiving zofran at the nursing facility over the past few days and states it has not helped. Pt complaining of some generalized weakness. Denies diarrhea.

## 2014-12-14 NOTE — ED Provider Notes (Signed)
The patient is a 74 year old female, she comes from a nursing facility with complaint of a fever and altered mental status. According to the daughter she has been repeating the same phrases over and over, she does have a history of congestive heart failure, she has had recent cellulitis of the right lower extremity. On exam the patient is confused, has repetitive statements, is tachycardic, tachypneic, febrile to 104.6 and has some erythema on the right lower extremity below the knee on the lateral surface. She has no abdominal tenderness, she does have rales at the bases, she is mildly tachypneic. The patient does appear to have criteria for sepsis by multiple evaluation criteria including Sirs as well as qSOFA, she is critically ill with infection, antibody 7 ordered with an unknown source though suspect pneumonia or cellulitis, she also has frequent UTIs. We'll cover with vancomycin, Zosyn, 30 mL/kg fluid bolus, she will be admitted to the hospital.  CRITICAL CARE Performed by: Vida Roller Total critical care time: 35 Critical care time was exclusive of separately billable procedures and treating other patients. Critical care was necessary to treat or prevent imminent or life-threatening deterioration. Critical care was time spent personally by me on the following activities: development of treatment plan with patient and/or surrogate as well as nursing, discussions with consultants, evaluation of patient's response to treatment, examination of patient, obtaining history from patient or surrogate, ordering and performing treatments and interventions, ordering and review of laboratory studies, ordering and review of radiographic studies, pulse oximetry and re-evaluation of patient's condition.  Medical screening examination/treatment/procedure(s) were conducted as a shared visit with non-physician practitioner(s) and myself.  I personally evaluated the patient during the encounter.  Clinical  Impression:   Final diagnoses:  Sepsis, due to unspecified organism  UTI (lower urinary tract infection)         Eber Hong, MD 12/15/14 [phone]

## 2014-12-14 NOTE — H&P (Signed)
[facility] History and Physical  Margaret [name] KPQ:[id number] DOB: 1941-04-05 DOA: 12/14/2014  Referring physician: ED physician PCP: Allean Found, MD   Chief Complaint: Confusion and vomiting  HPI:  Margaret [name] is a 74yo woman with PMH of NICM with EF of 35%, ICD in place, personal history of cardiac arrest, Afib on coumadin, HTN, HLD, hypothyroidism, CKD and h/o TIA who presents for 3 days of vomiting and confusion where she was "talking out of her head."  Patient is awake and alert and able to give most of the history.  Husband and daughter also in room and provided some history.  3 days ago, Margaret [name] began feeling ill.  She started having fevers (up to 104), nausea and vomiting and difficulty keeping food down.  Her MS was altered as well.  Other symptoms included SOB with some hypoxia (lips turning blue), chronic incontinence and worsened swelling in her legs.  She further has skin changes due to chronic venous stasis in the legs and she has been treated with topical steroids.  She denies cough, chest pain, diarrhea, abdominal pain, ICD firing, dysuria, change in urine.  She does report that she might be going to the bathroom more often.  She has been taking her meds at the [facility].   In the ED, she was found to be febrile and hypotensive with some tachycardia.  WBC is normal, but with a left shift.  LA was 2.33 initially.  She received 2.8L of fluids and BP was still soft, so maintenance fluids were initiated. Her urine is positive for nitrite, 21-50 WBC, cloudy and moderate LE.  She also has some worsening of her renal function.  CXR showed only vascular congestion.   Assessment and Plan:  Sepsis from a urinary source - Admit to SDU, follow on telemetry - She is from NH, started on broad spectrum Abx with vancomycin and Zosyn - IVF with NS Overnight given persistent low blood pressures.  She is mentating well at this time and not hypoxic.  NS at 125cc/hr - Low threshold to get  PCCM involved if she does not improve - Trend CBC - O2 therapy as needed - LA elevated > 2, will trend - Sent PCT - UC, BC X 2 pending, follow up tomorrow - Tylenol for fever - She has been on clobetasol cream for her LE erythema.  If BP does not improve as expected, can consider a trial of hydrocortisone; however, I do not expect her steroid exposure to cause adrenal insufficiency at this time.   NICM with systolic CHF and ICD, abnormal EKG - Patient with severe CHF at baseline - Holding BP medications and her lasix at this time given low BP and sepsis - She is requiring IVF to maintain her BP in the acute setting, decrease fluids as BP improves - IV lasix if needed for worsening respiratory status - If BP continues to worsen and she becomes hypoxic, may need pressors in the short term - AM CXR - Check TnI and trend given severe disease.  EKG shows possibly new TWI.  This is likely related to acute illness as she is not complaining of any chest pain at this time  Acute on Chronic kidney disease - Likely related to acute sepsis from urinary source - Fluids as noted above - BMET in the AM  Atrial fibrillation on coumadin - Coumadin per pharmacy consult - INR 1.9 on admission - Holding beta blocker in the setting of acute sepsis as noted above,  can restart low dose if tachycardia develops - On telemetry    Osteoarthritis - Oxycodone and tylenol for pain    Hypertension - Low BP currently - Hold metoprolol    Hyperlipidemia - Based on home meds, not currently on a statin - Address once acute illness improves  Hypothyroidism - Continue home synthroid  Overactive bladder with incontinence - Wears depends and request these be started here - No foley in setting of acute sepsis from urinary source - Continue myrbetriq    Major depression, chronic - Continue cymbalta    Radiological Exams on Admission: Dg Chest Port 1 View  12/14/2014   CLINICAL DATA:  Nausea and  vomiting for 2 days  EXAM: PORTABLE CHEST - 1 VIEW  COMPARISON:  12/17/2013  FINDINGS: Cardiac shadow is mildly enlarged but stable. Multiple leads are again identified over the cardiac shadow. The lungs are well aerated. Mild vascular congestion is noted. No focal confluent infiltrate is seen.  IMPRESSION: Mild vascular congestion   Electronically Signed   By: Alcide Clever M.D.   On: 12/14/2014 17:15    Code Status: Full, discussed at length with patient and family.  They would only want respiratory support if she were to need it for a short time.  Family Communication: Pt, daughter and husband at bedside Disposition Plan: Admit for further evaluation    Debe Coder, MD [phone]   Review of Systems:  Constitutional: + for fever, chills, fatigue, diaphoresis, confusion.  HENT: Negative for hearing loss, ear pain Eyes: Negative for blurred vision, double vision Respiratory: + for SOB.  Negative for cough, hemoptysis, sputum production, wheezing and stridor.   Cardiovascular: + for leg swelling Negative for chest pain, palpitations, orthopnea  Gastrointestinal: + for nausea and vomiting Negative for abdominal pain. Negative for heartburn, constipation, blood in stool and melena.  Genitourinary: + for frequency Negative for dysuria, urgency, hematuria and flank pain.  Musculoskeletal: + for chronic arthritis pain Negative for myalgias, joint pain and falls.  Skin: + for skin change on LE, redness Negative for itching and rash.  Neurological: + for chronic tremor Negative for dizziness and weakness. Negative for tingling, sensory change, speech change, focal weakness, loss of consciousness and headaches.  Endo/Heme/Allergies: Does bruise/bleed easily.     Past Medical History  Diagnosis Date  . NICM (nonischemic cardiomyopathy)     EF 35%; cath 2/12 no CAD  . Systolic CHF, chronic   . Severe mitral regurgitation     s/p complex mitral valve repair with cox maze + LAA clipping, removal of  RV lead, and implantation of CRT-D in abdomen  . Cardiac arrest - ventricular fibrillation     in 1990s  . Atrial fibrillation     amiodarone  . HTN (hypertension)   . HLD (hyperlipidemia)   . Achalasia     s/p dilation  . Recurrent UTI   . Obesity   . Anoxic brain injury 1996    s/p cardiac arrest   . Automatic implantable cardioverter-defibrillator in situ   . Hypothyroidism   . TIA (transient ischemic attack)   . Chronic kidney disease (CKD), stage IV (severe)     [name] 10/03/2013    Past Surgical History  Procedure Laterality Date  . Defibrillator generator explantation and reimplantation; and  08/02/2001  . Device migration with anticipated pocket revision  10/29/2003  . 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 Attempted implantation of an implantable cardioverter-  12/30/2004  .  Cardioversion  04/14/2010  . Median sternotomy  07/16/2010  . Mitral valve repair  07/16/2010  . Cox maze procedure (complete biatrial lesion set with clipping of  07/16/2010  . Removal of old endocardial right ventricular defibrillator lead.  07/16/2010  . Placement of dual chamber pacemaker and implantable cardiac  07/16/2010  . Placement of swan ganz pulmonary artery catheter via left femoral access.  07/16/2010  . Laparoscopic cholecystectomy    . Cataract extraction, bilateral    . Cystoscopy w/ stone manipulation    . Cataract extraction, bilateral Bilateral     Social History:  reports that she has never smoked. She has never used smokeless tobacco. She reports that she does not drink alcohol or use illicit drugs. Confirmed with patient and family  No Known Allergies  Family History  Problem Relation Age of Onset  . Stroke Mother   . Lung cancer Father   . Diabetes Mellitus II Brother     Prior to Admission medications   Medication Sig Start Date End Date Taking? Authorizing Provider  acetaminophen (TYLENOL) 325 MG tablet Take 650 mg by  mouth every 6 (six) hours as needed for moderate pain.   Yes Historical Provider, MD  acetaminophen (TYLENOL) 500 MG tablet Take 500 mg by mouth at bedtime.   Yes Historical Provider, MD  DULoxetine (CYMBALTA) 20 MG capsule Take 20 mg by mouth daily. For depression 09/19/14  Yes Historical Provider, MD  fluticasone (FLONASE) 50 MCG/ACT nasal spray Place 2 sprays into both nostrils at bedtime. For allergies 06/26/10  Yes Historical Provider, MD  folic acid (FOLVITE) 1 MG tablet Take 1 tablet by mouth daily. 08/31/10  Yes Historical Provider, MD  furosemide (LASIX) 80 MG tablet Take 160 mg by mouth 2 (two) times daily. Take 2 tabs = 160 mg PO BID   Yes Historical Provider, MD  levothyroxine (SYNTHROID, LEVOTHROID) 50 MCG tablet Take 50 mcg by mouth daily before breakfast.   Yes Historical Provider, MD  loratadine (CLARITIN) 10 MG tablet Take 10 mg by mouth daily.   Yes Historical Provider, MD  metoprolol tartrate (LOPRESSOR) 25 MG tablet Take 12.5 mg by mouth 2 (two) times daily.  08/31/10  Yes Historical Provider, MD  mirabegron ER (MYRBETRIQ) 50 MG TB24 tablet Take 50 mg by mouth daily.   Yes Historical Provider, MD  ondansetron (ZOFRAN) 4 MG tablet Take 4 mg by mouth every 6 (six) hours as needed for nausea or vomiting.   Yes Historical Provider, MD  potassium chloride SA (K-DUR,KLOR-CON) 20 MEQ tablet Take 40 mEq by mouth 2 (two) times daily.   Yes Historical Provider, MD  warfarin (COUMADIN) 3 MG tablet Take 3 mg by mouth daily at 6 PM.   Yes Historical Provider, MD  clobetasol ointment (TEMOVATE) 0.05 % Apply 1 application topically every morning. For rt leg, abdomen, and under breast.    Historical Provider, MD  desloratadine (CLARINEX) 5 MG tablet Take 5 mg by mouth every morning. For allergies    Historical Provider, MD  DiphenhydrAMINE HCl, Sleep, (NIGHT-TIME SLEEP AID PO) Take 1 tablet by mouth at bedtime.    Historical Provider, MD    Physical Exam: Filed Vitals:   12/14/14 1758 12/14/14 1815  12/14/14 1845 12/14/14 1900  BP:   Pulse:  99 97 94  Temp: 102.3 F (39.1 C)     TempSrc: Rectal     Resp:  Height:      SpO2:  97% 97%  97%    Physical Exam  Constitutional: Elderly, appears fatigued HENT: Normocephalic. Oropharynx is clear and moist.  Eyes: Conjunctivae and EOM are normal. PERRLA, no scleral icterus.  Neck: Normal ROM. Neck supple CVS: RR, NR, S1/S2 +, no murmurs, Pulmonary: Effort and breath sounds normal, crackles at bases Abdominal: Soft. BS +,  no distension, tenderness Musculoskeletal: + trace edema to mid calf and no tenderness.  Neuro: Alert. No cranial nerve deficit. Skin: Skin is warm and dry. + erythema to right lateral LE.  + skin tear with bandage to Lateral left LE.  + bruises to arms (on coumadin) Psychiatric: Normal mood and affect. Behavior, judgment, thought content normal.   Labs on Admission:  Basic Metabolic Panel:  Recent Labs Lab 12/14/14 1614  NA 137  K 3.8  CL 99*  CO2 26  GLUCOSE 144*  BUN 41*  CREATININE 2.60*  CALCIUM 8.1*   CBC:  Recent Labs Lab 12/14/14 1614  WBC 7.9  NEUTROABS 7.2  HGB 9.8*  HCT 31.6*  MCV 91.9  PLT 155    EKG: Normal sinus rhythm, TWI in precordial leads which appear new.    If 7PM-7AM, please contact night-coverage [email or web address] Password [facility] 12/14/2014, 7:38 PM

## 2014-12-14 NOTE — Progress Notes (Signed)
ANTIBIOTIC CONSULT NOTE - INITIAL  Pharmacy Consult for Vanco/Zosyn Indication: rule out sepsis  No Known Allergies  Patient Measurements: Height: 5\' 5"  (165.1 cm) IBW/kg (Calculated) : 57 Adjusted Body Weight:    Vital Signs: Temp: 104.6 F (40.3 C) (08/21 1600) Temp Source: Rectal (08/21 1600) BP: 102/57 mmHg (08/21 1630) Pulse Rate: 113 (08/21 1630) Intake/Output from previous day:   Intake/Output from this shift:    Labs: No results for input(s): WBC, HGB, PLT, LABCREA, CREATININE in the last 72 hours. CrCl cannot be calculated (Patient has no serum creatinine result on file.). No results for input(s): VANCOTROUGH, VANCOPEAK, VANCORANDOM, GENTTROUGH, GENTPEAK, GENTRANDOM, TOBRATROUGH, TOBRAPEAK, TOBRARND, AMIKACINPEAK, AMIKACINTROU, AMIKACIN in the last 72 hours.   Microbiology: No results found for this or any previous visit (from the past 720 hour(s)).  Medical History: Past Medical History  Diagnosis Date  . NICM (nonischemic cardiomyopathy)     EF 35%; cath 2/12 no CAD  . Systolic CHF, chronic   . Severe mitral regurgitation     s/p complex mitral valve repair with cox maze + LAA clipping, removal of RV lead, and implantation of CRT-D in abdomen  . Cardiac arrest - ventricular fibrillation     in 1990s  . Atrial fibrillation     amiodarone  . HTN (hypertension)   . HLD (hyperlipidemia)   . Achalasia     s/p dilation  . Recurrent UTI   . Obesity   . Anoxic brain injury 1996    s/p cardiac arrest   . Automatic implantable cardioverter-defibrillator in situ   . Hypothyroidism   . TIA (transient ischemic attack)   . Chronic kidney disease (CKD), stage IV (severe)     Margaret [name] 10/03/2013    Medications: see med rec  Assessment: 74 y/o resident of [name] presents with N/V, tremors, Temp 102.2>>104.6. HR 113, BP low 102/57. LA 2.33. Start abx for sepsis. Scr 2.6 (CrCl 21)  Patient is also noted to be on Coumadin PTA for afib. Last INR 1.8 on 8/16.  Dose changed to 3mg  daily.  Goal of Therapy:  Vancomycin trough level 15-20 mcg/ml  Plan:  Plan: Vancomycin 1000mg  given in ED then 1g/24h Zosyn 3.375g IV in ED then 2.25g IV q8h. Check INR now  Margaret [name] S. Margaret [name], PharmD, BCPS Clinical Staff Pharmacist Pager [phone]  Margaret [name] 12/14/2014,4:42 PM

## 2014-12-14 NOTE — Progress Notes (Signed)
Patient has a positive MRSA PCR, orders for treatment placed per protocol

## 2014-12-14 NOTE — ED Provider Notes (Signed)
CSN: [id number]     Arrival date & time 12/14/14  1544 History   First MD Initiated Contact with Patient 12/14/14 1553     Chief Complaint  Patient presents with  . Nausea   History from patient and daughter  HPI  Ms. Gruwell is a 74 year old female with complicated medical history including CHF, anoxic brain injury s/p v fib cardiac arrest, a fib on coumadin and CKD5 presenting with fever, nausea and AMS. Daughter reports she had multiple episodes of fever, AMS and nausea over the weekend that would resolve in 15 minutes. Her daughter states that earlier today she had shaking chills, nausea and vomited again. She is more confused than baseline currently and is repeating phrases. Endorses increased leg swelling. Pt has had a rash over right lower leg for past year that she sees dermatology for but daughter repotrs it has recently spread and gotten redder. Denies headaches, chest pain, SOB, cough, abdominal pain or dysuria.    Past Medical History  Diagnosis Date  . NICM (nonischemic cardiomyopathy)     EF 35%; cath 2/12 no CAD  . Systolic CHF, chronic   . Severe mitral regurgitation     s/p complex mitral valve repair with cox maze + LAA clipping, removal of RV lead, and implantation of CRT-D in abdomen  . Cardiac arrest - ventricular fibrillation     in 1990s  . Atrial fibrillation     amiodarone  . HTN (hypertension)   . HLD (hyperlipidemia)   . Achalasia     s/p dilation  . Recurrent UTI   . Obesity   . Anoxic brain injury 1996    s/p cardiac arrest   . Automatic implantable cardioverter-defibrillator in situ   . Hypothyroidism   . TIA (transient ischemic attack)   . Chronic kidney disease (CKD), stage IV (severe)     [name] 10/03/2013   Past Surgical History  Procedure Laterality Date  . Defibrillator generator explantation and reimplantation; and  08/02/2001  . Device migration with anticipated pocket revision  10/29/2003  . 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 Attempted implantation of an implantable cardioverter-  12/30/2004  . Cardioversion  04/14/2010  . Median sternotomy  07/16/2010  . Mitral valve repair  07/16/2010  . Cox maze procedure (complete biatrial lesion set with clipping of  07/16/2010  . Removal of old endocardial right ventricular defibrillator lead.  07/16/2010  . Placement of dual chamber pacemaker and implantable cardiac  07/16/2010  . Placement of swan ganz pulmonary artery catheter via left femoral access.  07/16/2010  . Laparoscopic cholecystectomy    . Cataract extraction, bilateral    . Cystoscopy w/ stone manipulation    . Cataract extraction, bilateral Bilateral    Family History  Problem Relation Age of Onset  . Stroke Mother   . Lung cancer Father   . Diabetes Mellitus II Brother    Social History  Substance Use Topics  . Smoking status: Never Smoker   . Smokeless tobacco: Never Used  . Alcohol Use: No   OB History    No data available     Review of Systems  Constitutional: Positive for fever, chills and diaphoresis.  HENT: Negative for ear pain and sore throat.   Respiratory: Negative for cough, shortness of breath and wheezing.   Cardiovascular: Positive for leg swelling. Negative for chest pain and palpitations.  Gastrointestinal: Positive for nausea and vomiting. Negative for abdominal pain  and diarrhea.  Genitourinary: Positive for frequency. Negative for dysuria and hematuria.  Musculoskeletal: Negative for back pain and neck pain.  Skin: Positive for wound.  Neurological: Negative for dizziness, syncope, weakness, light-headedness and headaches.  Hematological: Bruises/bleeds easily.  Psychiatric/Behavioral: Positive for confusion.      Allergies  Review of patient's allergies indicates no known allergies.  Home Medications   Prior to Admission medications   Medication Sig Start Date End Date Taking? Authorizing Provider  acetaminophen (TYLENOL) 325 MG  tablet Take 650 mg by mouth every 6 (six) hours as needed for moderate pain.   Yes Historical Provider, MD  acetaminophen (TYLENOL) 500 MG tablet Take 500 mg by mouth at bedtime.   Yes Historical Provider, MD  DULoxetine (CYMBALTA) 20 MG capsule Take 20 mg by mouth daily. For depression 09/19/14  Yes Historical Provider, MD  fluticasone (FLONASE) 50 MCG/ACT nasal spray Place 2 sprays into both nostrils at bedtime. For allergies 06/26/10  Yes Historical Provider, MD  folic acid (FOLVITE) 1 MG tablet Take 1 tablet by mouth daily. 08/31/10  Yes Historical Provider, MD  furosemide (LASIX) 80 MG tablet Take 160 mg by mouth 2 (two) times daily. Take 2 tabs = 160 mg PO BID   Yes Historical Provider, MD  levothyroxine (SYNTHROID, LEVOTHROID) 50 MCG tablet Take 50 mcg by mouth daily before breakfast.   Yes Historical Provider, MD  loratadine (CLARITIN) 10 MG tablet Take 10 mg by mouth daily.   Yes Historical Provider, MD  metoprolol tartrate (LOPRESSOR) 25 MG tablet Take 12.5 mg by mouth 2 (two) times daily.  08/31/10  Yes Historical Provider, MD  mirabegron ER (MYRBETRIQ) 50 MG TB24 tablet Take 50 mg by mouth daily.   Yes Historical Provider, MD  ondansetron (ZOFRAN) 4 MG tablet Take 4 mg by mouth every 6 (six) hours as needed for nausea or vomiting.   Yes Historical Provider, MD  potassium chloride SA (K-DUR,KLOR-CON) 20 MEQ tablet Take 40 mEq by mouth 2 (two) times daily.   Yes Historical Provider, MD  warfarin (COUMADIN) 3 MG tablet Take 3 mg by mouth daily at 6 PM.   Yes Historical Provider, MD  clobetasol ointment (TEMOVATE) 0.05 % Apply 1 application topically every morning. For rt leg, abdomen, and under breast.    Historical Provider, MD   BP 97/53 mmHg  Pulse 90  Temp(Src) 100.3 F (37.9 C) (Rectal)  Resp 19  Ht 5\' 5"  (1.651 m)  SpO2 96% Physical Exam  Constitutional: She appears well-developed and well-nourished. No distress.  HENT:  Head: Normocephalic and atraumatic.  Mouth/Throat: Oropharynx  is clear and moist. No oropharyngeal exudate.  Eyes: Conjunctivae and EOM are normal. Pupils are equal, round, and reactive to light.  Neck: Normal range of motion. Neck supple.  Cardiovascular: Normal rate, regular rhythm and normal heart sounds.   Pulmonary/Chest: No respiratory distress. She has rales.  Abdominal: Soft. She exhibits no distension. There is no tenderness. There is no rigidity, no rebound and no guarding.  Neurological: She is alert.  Oriented to person and place. Believes it is May. PERRL. EOM intact. Uvula midline. No facial droop. 5/5 grip strength and plantar/dorsiflexion. Sensation intact.   Skin: Skin is warm and dry. She is not diaphoretic.  Erythema of right calf extending to ankle. Not warm to touch. Pressure ulcer on left heel.   Psychiatric:  Repetitive phrases.   Vitals reviewed.   ED Course  Procedures (including critical care time)  CRITICAL CARE Performed by: Simeon Craft  Total critical care time: 30 minutes  Critical care time was exclusive of separately billable procedures and treating other patients.  Critical care was necessary to treat or prevent imminent or life-threatening deterioration.  Critical care was time spent personally by me on the following activities: development of treatment plan with patient and/or surrogate as well as nursing, discussions with consultants, evaluation of patient's response to treatment, examination of patient, obtaining history from patient or surrogate, ordering and performing treatments and interventions, ordering and review of laboratory studies, ordering and review of radiographic studies, pulse oximetry and re-evaluation of patient's condition.  Labs Review Labs Reviewed  MRSA PCR SCREENING - Abnormal; Notable for the following:    MRSA by PCR POSITIVE (*)    All other components within normal limits  URINALYSIS, ROUTINE W REFLEX MICROSCOPIC (NOT AT [facility]) - Abnormal; Notable for the following:     APPearance CLOUDY (*)    Hgb urine dipstick LARGE (*)    Protein, ur 100 (*)    Urobilinogen, UA 4.0 (*)    Nitrite POSITIVE (*)    Leukocytes, UA MODERATE (*)    All other components within normal limits  CBC WITH DIFFERENTIAL/PLATELET - Abnormal; Notable for the following:    RBC 3.44 (*)    Hemoglobin 9.8 (*)    HCT 31.6 (*)    RDW 15.9 (*)    Neutrophils Relative % 91 (*)    Lymphocytes Relative 4 (*)    Lymphs Abs 0.3 (*)    All other components within normal limits  BASIC METABOLIC PANEL - Abnormal; Notable for the following:    Chloride 99 (*)    Glucose, Bld 144 (*)    BUN 41 (*)    Creatinine, Ser 2.60 (*)    Calcium 8.1 (*)    GFR calc non Af Amer 17 (*)    GFR calc Af Amer 20 (*)    All other components within normal limits  URINE MICROSCOPIC-ADD ON - Abnormal; Notable for the following:    Bacteria, UA MANY (*)    All other components within normal limits  PROTIME-INR - Abnormal; Notable for the following:    Prothrombin Time 21.3 (*)    INR 1.85 (*)    All other components within normal limits  APTT - Abnormal; Notable for the following:    aPTT 38 (*)    All other components within normal limits  HEPATIC FUNCTION PANEL - Abnormal; Notable for the following:    Total Protein 6.1 (*)    Albumin 2.3 (*)    Total Bilirubin 1.9 (*)    Indirect Bilirubin 1.5 (*)    All other components within normal limits  TROPONIN I - Abnormal; Notable for the following:    Troponin I 0.06 (*)    All other components within normal limits  I-STAT CG4 LACTIC ACID, ED - Abnormal; Notable for the following:    Lactic Acid, Venous 2.33 (*)    All other components within normal limits  CULTURE, BLOOD (ROUTINE X 2)  CULTURE, BLOOD (ROUTINE X 2)  URINE CULTURE  LACTIC ACID, PLASMA  PROCALCITONIN  LACTIC ACID, PLASMA  COMPREHENSIVE METABOLIC PANEL  CBC  PROTIME-INR  HEMOGLOBIN A1C  TROPONIN I  TROPONIN I  I-STAT CG4 LACTIC ACID, ED    Imaging Review Dg Chest Port 1  View  12/14/2014   CLINICAL DATA:  Nausea and vomiting for 2 days  EXAM: PORTABLE CHEST - 1 VIEW  COMPARISON:  12/17/2013  FINDINGS: Cardiac shadow  is mildly enlarged but stable. Multiple leads are again identified over the cardiac shadow. The lungs are well aerated. Mild vascular congestion is noted. No focal confluent infiltrate is seen.  IMPRESSION: Mild vascular congestion   Electronically Signed   By: Alcide Clever M.D.   On: 12/14/2014 17:15   I have personally reviewed and evaluated these images and lab results as part of my medical decision-making.   EKG Interpretation   Date/Time:  Sunday December 14 2014 16:48:00 EDT Ventricular Rate:  114 PR Interval:  52 QRS Duration: 106 QT Interval:  387 QTC Calculation: 533 R Axis:   116 Text Interpretation:  Sinus tachycardia Right axis deviation Abnormal T,  consider ischemia, diffuse leads Prolonged QT interval Since last tracing  rate faster Confirmed by MILLER  MD, BRIAN ([id number]) on 12/14/2014 4:59:29 PM      MDM   Final diagnoses:  Sepsis, due to unspecified organism  UTI (lower urinary tract infection)   1. Sepsis, UTI Pt is 74 year old presenting with fever and AMS. Pt has history of a fib on coumadin, CHF, anoxic brain injury, CKD and recurrent UTI. Febrile, tachycardiac, tachypneic with AMS on arrival to ED. Septic by SIRS and qSOFA; code sepsis enacted.  On exam, pt has lower lobe rales and erythema of right lower lateral leg. History of recurrent UTIs. Lactate 2.3. Troponin 0.06. CXR with vascular congestion and UA showing obvious infection. Started on zosyn and vanc and 30 mL/kg bolus in ED. Vitals improving in ED. Consulted hospitalist Dr. Criselda Peaches for admission to step down.    Alveta Heimlich, PA-C 12/15/14 0026  Kermit Arnette, PA-C 12/15/14 0057  Eber Hong, MD 12/15/14 1626

## 2014-12-15 DIAGNOSIS — N184 Chronic kidney disease, stage 4 (severe): Secondary | ICD-10-CM

## 2014-12-15 DIAGNOSIS — Z95810 Presence of automatic (implantable) cardiac defibrillator: Secondary | ICD-10-CM

## 2014-12-15 DIAGNOSIS — N3281 Overactive bladder: Secondary | ICD-10-CM

## 2014-12-15 DIAGNOSIS — E039 Hypothyroidism, unspecified: Secondary | ICD-10-CM

## 2014-12-15 LAB — COMPREHENSIVE METABOLIC PANEL
ALT: 17 U/L (ref 14–54)
AST: 23 U/L (ref 15–41)
Albumin: 2.3 g/dL — ABNORMAL LOW (ref 3.5–5.0)
Alkaline Phosphatase: 78 U/L (ref 38–126)
Anion gap: 9 (ref 5–15)
BUN: 40 mg/dL — ABNORMAL HIGH (ref 6–20)
CHLORIDE: 108 mmol/L (ref 101–111)
CO2: 24 mmol/L (ref 22–32)
CREATININE: 2.46 mg/dL — AB (ref 0.44–1.00)
Calcium: 7.5 mg/dL — ABNORMAL LOW (ref 8.9–10.3)
GFR, EST AFRICAN AMERICAN: 21 mL/min — AB (ref >60)
GFR, EST NON AFRICAN AMERICAN: 18 mL/min — AB (ref >60)
Glucose, Bld: 140 mg/dL — ABNORMAL HIGH (ref 65–99)
POTASSIUM: 4.2 mmol/L (ref 3.5–5.1)
SODIUM: 141 mmol/L (ref 135–145)
Total Bilirubin: 1.7 mg/dL — ABNORMAL HIGH (ref 0.3–1.2)
Total Protein: 5.9 g/dL — ABNORMAL LOW (ref 6.5–8.1)

## 2014-12-15 LAB — CBC
HEMATOCRIT: 27.1 % — AB (ref 36.0–46.0)
HEMOGLOBIN: 8.2 g/dL — AB (ref 12.0–15.0)
MCH: 28.3 pg (ref 26.0–34.0)
MCHC: 30.3 g/dL (ref 30.0–36.0)
MCV: 93.4 fL (ref 78.0–100.0)
Platelets: 128 10*3/uL — ABNORMAL LOW (ref 150–400)
RBC: 2.9 MIL/uL — AB (ref 3.87–5.11)
RDW: 16.2 % — ABNORMAL HIGH (ref 11.5–15.5)
WBC: 10.6 10*3/uL — AB (ref 4.0–10.5)

## 2014-12-15 LAB — PROTIME-INR
INR: 1.8 — ABNORMAL HIGH (ref 0.00–1.49)
Prothrombin Time: 20.8 seconds — ABNORMAL HIGH (ref 11.6–15.2)

## 2014-12-15 LAB — TROPONIN I
TROPONIN I: 0.04 ng/mL — AB (ref <0.031)
TROPONIN I: 0.06 ng/mL — AB (ref <0.031)

## 2014-12-15 LAB — LACTIC ACID, PLASMA: Lactic Acid, Venous: 0.9 mmol/L (ref 0.5–2.0)

## 2014-12-15 NOTE — Progress Notes (Signed)
Nutrition Brief Note  Patient identified on the Malnutrition Screening Tool (MST) Report.  Wt Readings from Last 15 Encounters:  12/15/14 212 lb (96.163 kg)  12/09/14 204 lb 6.4 oz (92.715 kg)  12/04/14 204 lb 6.4 oz (92.715 kg)  11/07/14 196 lb 12.8 oz (89.268 kg)  11/06/14 200 lb (90.719 kg)  07/19/14 196 lb (88.905 kg)  12/01/13 220 lb 4.8 oz (99.927 kg)  10/23/13 215 lb 3.2 oz (97.614 kg)  10/07/13 197 lb 14.4 oz (89.767 kg)  06/06/13 221 lb (100.245 kg)  04/02/13 229 lb 11.5 oz (104.2 kg)  05/31/12 207 lb (93.895 kg)  03/24/12 200 lb (90.719 kg)  05/26/11 196 lb (88.905 kg)  02/14/11 190 lb (86.183 kg)    Body mass index is 35.28 kg/(m^2). Patient meets criteria for Obesity Class II based on current BMI.   Current diet order is Heart Healthy.  Reports a good appetite.  Labs and medications reviewed.   No nutrition interventions warranted at this time. If nutrition issues arise, please consult RD.   Maureen Chatters, RD, LDN Pager #: [phone] After-Hours Pager #: [phone]

## 2014-12-15 NOTE — Progress Notes (Signed)
Utilization Review Completed.  

## 2014-12-15 NOTE — Progress Notes (Addendum)
CRITICAL VALUE ALERT  Critical value received:  Positive Blood Cultures, Gram negative rods in Aerobic and Anaerobic bottle.   Date of notification:  12/15/2014   Time of notification:  6:43 AM   Critical value read back:Yes.    Nurse who received alert:  Cresenciano Lick, RN  MD notified (1st page):  .[place], PA  Time of first page:  6:44 AM     Responding MD:  Georgia Dom,, PA  Time MD responded:  6:48 AM

## 2014-12-15 NOTE — Progress Notes (Signed)
[place] TEAM 1 - Stepdown/ICU TEAM Progress Note  SANDRIKA SCHWINN ZOX:[id number] DOB: Dec 07, 1940 DOA: 12/14/2014 PCP: Allean Found, MD  Admit HPI / Brief Narrative: 74yo F with Hx  of NICM (EF of 35%), ICD, personal history of cardiac arrest, Afib on coumadin, HTN, HLD, hypothyroidism, CKD, and TIA who presented c/o 3 days of vomiting and confusion where she was "talking out of her head." She started having fevers (up to 104), nausea and vomiting and difficulty keeping food down. Her MS was altered as well. Other symptoms included SOB with some hypoxia (lips turning blue), chronic incontinence, and worsened swelling in her legs.   In the ED she was found to be febrile and hypotensive with some tachycardia. WBC was normal, but with a left shift. LA was 2.33. She received 2.8L of fluids and BP was still soft, so maintenance fluids were initiated. Her urine was positive for nitrite, 21-50 WBC, cloudy and moderate LE. She also has some worsening of her renal function. CXR showed only vascular congestion.   HPI/Subjective: The patient is awake and alert.  She is quite circumferential in her speech but her family suggest this is her baseline.  She denies chest pain nausea vomiting shortness of breath or abdominal pain at the present time.  Assessment/Plan:  Sepsis from a urinary source - gram negative rod bacteremia  Continue empiric antibiotics - hemodynamically the patient appears to be stabilizing nicely - await speciation and results from blood/urine cultures  NICM with systolic CHF and ICD, abnormal EKG Holding BP medications and her lasix at this time given low BP and sepsis - EKG shows T wave inversions not noted in March - follow-up EKG in a.m.  Prolonged QTc  A chronic issue - has an ICD - avoid prolonging meds   Acute on Chronic kidney disease Renal function improving w/ volume expansion  Atrial fibrillation on coumadin Coumadin per pharmacy consult - INR 1.9 on  admission - rate controlled    Osteoarthritis Oxycodone and tylenol for pain   Hypertension Hold metoprolol in setting of hypotension/sepsis    Hyperlipidemia not currently on a statin - address once acute illness improves  Hypothyroidism Continue home synthroid  Overactive bladder with incontinence Wears depends and request these be started here - avoid foley in setting of acute sepsis from urinary source   Major depression, chronic Continue cymbalta  MRSA screen +  Code Status: FULL Family Communication: spoke w/ son/daugher at bedside  Disposition Plan: SDU  Consultants: none  Procedures: none  Antibiotics: Zosyn 8/21 > Vanc 8/21 >  DVT prophylaxis: warfarin  Objective: Blood pressure 99/53, pulse 83, temperature 97.8 F (36.6 C), temperature source Oral, resp. rate 16, height 5\' 5"  (1.651 m), weight 96.163 kg (212 lb), SpO2 97 %.  Intake/Output Summary (Last 24 hours) at 12/15/14 1113 Last data filed at 12/15/14 0913  Gross per 24 hour  Intake 4295.08 ml  Output    150 ml  Net 4145.08 ml   Exam: General: No acute respiratory distress Lungs: Clear to auscultation bilaterally without wheezes or crackles Cardiovascular: Regular rate without murmur gallop or rub normal S1 and S2 Abdomen: Nontender, nondistended, soft, bowel sounds positive, no rebound, no ascites, no appreciable mass Extremities: No significant cyanosis, clubbing, or edema bilateral lower extremities  Data Reviewed: Basic Metabolic Panel:  Recent Labs Lab 12/14/14 1614 12/15/14 0239  NA 137 141  K 3.8 4.2  CL 99* 108  CO2 26 24  GLUCOSE 144* 140*  BUN 41* 40*  CREATININE 2.60* 2.46*  CALCIUM 8.1* 7.5*   CBC:  Recent Labs Lab 12/14/14 1614 12/15/14 0239  WBC 7.9 10.6*  NEUTROABS 7.2  --   HGB 9.8* 8.2*  HCT 31.6* 27.1*  MCV 91.9 93.4  PLT 155 128*    Liver Function Tests:  Recent Labs Lab 12/14/14 2126 12/15/14 0239  AST 25 23  ALT 18 17  ALKPHOS 76  78  BILITOT 1.9* 1.7*  PROT 6.1* 5.9*  ALBUMIN 2.3* 2.3*    Coags:  Recent Labs Lab 12/14/14 2112 12/15/14 0239  INR 1.85* 1.80*    Recent Labs Lab 12/14/14 2126  APTT 38*    Cardiac Enzymes:  Recent Labs Lab 12/14/14 2126 12/15/14 0239 12/15/14 0814  TROPONINI 0.06* 0.04* 0.06*    Recent Results (from the past 240 hour(s))  Blood Culture (routine x 2)     Status: None (Preliminary result)   Collection Time: 12/14/14  4:14 PM  Result Value Ref Range Status   Specimen Description BLOOD RIGHT ARM  Final   Special Requests BOTTLES DRAWN AEROBIC AND ANAEROBIC  Final   Culture  Setup Time   Final    GRAM NEGATIVE RODS IN BOTH AEROBIC AND ANAEROBIC BOTTLES CRITICAL RESULT CALLED TO, READ BACK BY AND VERIFIED WITH: TO MINGRAM(RN) BY [facility] 12/15/2014 AT 6:41AM    Culture PENDING  Incomplete   Report Status PENDING  Incomplete  Blood Culture (routine x 2)     Status: None (Preliminary result)   Collection Time: 12/14/14  4:48 PM  Result Value Ref Range Status   Specimen Description BLOOD RIGHT HAND  Final   Special Requests BOTTLES DRAWN AEROBIC AND ANAEROBIC 10CC  Final   Culture  Setup Time   Final    GRAM NEGATIVE RODS IN BOTH AEROBIC AND ANAEROBIC BOTTLES CRITICAL RESULT CALLED TO, READ BACK BY AND VERIFIED WITH: TO MINGRAM(RN) BY [facility] 12/15/2014 AT 6:43    Culture PENDING  Incomplete   Report Status PENDING  Incomplete  MRSA PCR Screening     Status: Abnormal   Collection Time: 12/14/14  8:35 PM  Result Value Ref Range Status   MRSA by PCR POSITIVE (A) NEGATIVE Final    Comment:        The GeneXpert MRSA Assay (FDA approved for NASAL specimens only), is one component of a comprehensive MRSA colonization surveillance program. It is not intended to diagnose MRSA infection nor to guide or monitor treatment for MRSA infections. RESULT CALLED TO, READ BACK BY AND VERIFIED WITH: PETTIFORD,A RN 2244 12/14/14 MITCHELL,L      Studies:   Recent  x-ray studies have been reviewed in detail by the Attending Physician  Scheduled Meds:  Scheduled Meds: . Chlorhexidine Gluconate Cloth  6 each Topical Q0600  . DULoxetine  20 mg Oral Daily  . fluticasone  2 spray Each Nare QHS  . folic acid  1 mg Oral Daily  . levothyroxine  50 mcg Oral QAC breakfast  . loratadine  10 mg Oral Daily  . mirabegron ER  50 mg Oral Daily  . mupirocin ointment  1 application Nasal BID  . piperacillin-tazobactam (ZOSYN)  IV  2.25 g Intravenous 3 times per day  . sodium chloride  3 mL Intravenous Q12H  . vancomycin  1,000 mg Intravenous Q24H  . warfarin  3 mg Oral q1800  . Warfarin - Physician Dosing Inpatient   Does not apply q1800    Time spent on care of this patient: 35 mins  Lonia Blood , MD   [facility] Office  [phone] Pager - Text Page per Amion as per below:  On-Call/Text Page:      [email or web address]      password [email or web address]  If 7PM-7AM, please contact night-coverage [email or web address] Password [email or web address] 12/15/2014, 11:13 AM   LOS: 1 day

## 2014-12-16 ENCOUNTER — Inpatient Hospital Stay (HOSPITAL_COMMUNITY): Payer: Medicare Other

## 2014-12-16 DIAGNOSIS — B9620 Unspecified Escherichia coli [E. coli] as the cause of diseases classified elsewhere: Secondary | ICD-10-CM | POA: Diagnosis present

## 2014-12-16 DIAGNOSIS — E876 Hypokalemia: Secondary | ICD-10-CM

## 2014-12-16 DIAGNOSIS — F3289 Other specified depressive episodes: Secondary | ICD-10-CM | POA: Diagnosis present

## 2014-12-16 DIAGNOSIS — I5022 Chronic systolic (congestive) heart failure: Secondary | ICD-10-CM | POA: Diagnosis present

## 2014-12-16 DIAGNOSIS — I10 Essential (primary) hypertension: Secondary | ICD-10-CM | POA: Diagnosis present

## 2014-12-16 DIAGNOSIS — A419 Sepsis, unspecified organism: Secondary | ICD-10-CM | POA: Diagnosis present

## 2014-12-16 DIAGNOSIS — I428 Other cardiomyopathies: Secondary | ICD-10-CM | POA: Diagnosis present

## 2014-12-16 DIAGNOSIS — F328 Other depressive episodes: Secondary | ICD-10-CM

## 2014-12-16 DIAGNOSIS — I482 Chronic atrial fibrillation, unspecified: Secondary | ICD-10-CM | POA: Diagnosis present

## 2014-12-16 DIAGNOSIS — I4581 Long QT syndrome: Secondary | ICD-10-CM

## 2014-12-16 DIAGNOSIS — N390 Urinary tract infection, site not specified: Secondary | ICD-10-CM

## 2014-12-16 DIAGNOSIS — R7881 Bacteremia: Secondary | ICD-10-CM | POA: Diagnosis present

## 2014-12-16 DIAGNOSIS — E038 Other specified hypothyroidism: Secondary | ICD-10-CM

## 2014-12-16 DIAGNOSIS — I429 Cardiomyopathy, unspecified: Secondary | ICD-10-CM

## 2014-12-16 LAB — COMPREHENSIVE METABOLIC PANEL
ALBUMIN: 2.3 g/dL — AB (ref 3.5–5.0)
ALT: 16 U/L (ref 14–54)
AST: 20 U/L (ref 15–41)
Alkaline Phosphatase: 71 U/L (ref 38–126)
Anion gap: 11 (ref 5–15)
BILIRUBIN TOTAL: 1.1 mg/dL (ref 0.3–1.2)
BUN: 29 mg/dL — AB (ref 6–20)
CHLORIDE: 107 mmol/L (ref 101–111)
CO2: 22 mmol/L (ref 22–32)
CREATININE: 2.04 mg/dL — AB (ref 0.44–1.00)
Calcium: 7.8 mg/dL — ABNORMAL LOW (ref 8.9–10.3)
GFR calc Af Amer: 26 mL/min — ABNORMAL LOW (ref >60)
GFR, EST NON AFRICAN AMERICAN: 23 mL/min — AB (ref >60)
GLUCOSE: 119 mg/dL — AB (ref 65–99)
POTASSIUM: 3 mmol/L — AB (ref 3.5–5.1)
Sodium: 140 mmol/L (ref 135–145)
Total Protein: 6 g/dL — ABNORMAL LOW (ref 6.5–8.1)

## 2014-12-16 LAB — PROTIME-INR
INR: 2.12 — ABNORMAL HIGH (ref 0.00–1.49)
Prothrombin Time: 23.6 seconds — ABNORMAL HIGH (ref 11.6–15.2)

## 2014-12-16 LAB — CBC
HEMATOCRIT: 26.7 % — AB (ref 36.0–46.0)
Hemoglobin: 8.2 g/dL — ABNORMAL LOW (ref 12.0–15.0)
MCH: 28.5 pg (ref 26.0–34.0)
MCHC: 30.7 g/dL (ref 30.0–36.0)
MCV: 92.7 fL (ref 78.0–100.0)
PLATELETS: 121 10*3/uL — AB (ref 150–400)
RBC: 2.88 MIL/uL — ABNORMAL LOW (ref 3.87–5.11)
RDW: 16 % — AB (ref 11.5–15.5)
WBC: 7.3 10*3/uL (ref 4.0–10.5)

## 2014-12-16 LAB — HEMOGLOBIN A1C
HEMOGLOBIN A1C: 5.9 % — AB (ref 4.8–5.6)
MEAN PLASMA GLUCOSE: 123 mg/dL

## 2014-12-16 LAB — MAGNESIUM
MAGNESIUM: 2.1 mg/dL (ref 1.7–2.4)
Magnesium: 2.2 mg/dL (ref 1.7–2.4)

## 2014-12-16 LAB — POTASSIUM: Potassium: 3.3 mmol/L — ABNORMAL LOW (ref 3.5–5.1)

## 2014-12-16 MED ORDER — PIPERACILLIN-TAZOBACTAM 3.375 G IVPB
3.3750 g | Freq: Three times a day (TID) | INTRAVENOUS | Status: DC
  Administered 2014-12-16 – 2014-12-17 (×4): 3.375 g via INTRAVENOUS
  Filled 2014-12-16 (×7): qty 50

## 2014-12-16 MED ORDER — CETYLPYRIDINIUM CHLORIDE 0.05 % MT LIQD
7.0000 mL | Freq: Two times a day (BID) | OROMUCOSAL | Status: DC
  Administered 2014-12-16 – 2014-12-18 (×5): 7 mL via OROMUCOSAL

## 2014-12-16 MED ORDER — DULOXETINE HCL 30 MG PO CPEP
30.0000 mg | ORAL_CAPSULE | Freq: Every day | ORAL | Status: DC
  Administered 2014-12-17 – 2014-12-18 (×2): 30 mg via ORAL
  Filled 2014-12-16 (×2): qty 1

## 2014-12-16 MED ORDER — POTASSIUM CHLORIDE 10 MEQ/100ML IV SOLN
10.0000 meq | INTRAVENOUS | Status: AC
  Administered 2014-12-16 (×3): 10 meq via INTRAVENOUS
  Filled 2014-12-16 (×3): qty 100

## 2014-12-16 MED ORDER — POTASSIUM CHLORIDE 10 MEQ/100ML IV SOLN
10.0000 meq | INTRAVENOUS | Status: DC
  Administered 2014-12-16 – 2014-12-17 (×3): 10 meq via INTRAVENOUS
  Filled 2014-12-16 (×3): qty 100

## 2014-12-16 NOTE — Progress Notes (Signed)
TEAM 1 - Stepdown/ICU TEAM Progress Note  Margaret [name] ZOX:[id number] DOB: Oct 20, 1940 DOA: 12/14/2014 PCP: Allean Found, MD  Admit HPI / Brief Narrative: 74yo woman with PMHx NICM with EF of 35%, ICD in place, Cardiac arrest, A-fib on coumadin, HTN, HLD, hypothyroidism, CKD stage IV, TIA, Anoxic Brain Injury   who presents for 3 days of vomiting and confusion where she was "talking out of her head." Patient is awake and alert and able to give most of the history. Husband and daughter also in room and provided some history. 3 days ago, Margaret [name] began feeling ill. She started having fevers (up to 104), nausea and vomiting and difficulty keeping food down. Her MS was altered as well. Other symptoms included SOB with some hypoxia (lips turning blue), chronic incontinence and worsened swelling in her legs. She further has skin changes due to chronic venous stasis in the legs and she has been treated with topical steroids. She denies cough, chest pain, diarrhea, abdominal pain, ICD firing, dysuria, change in urine. She does report that she might be going to the bathroom more often. She has been taking her meds at the [facility].   In the ED, she was found to be febrile and hypotensive with some tachycardia. WBC is normal, but with a left shift. LA was 2.33 initially. She received 2.8L of fluids and BP was still soft, so maintenance fluids were initiated. Her urine is positive for nitrite, 21-50 WBC, cloudy and moderate LE. She also has some worsening of her renal function. CXR showed only vascular congestion.   HPI/Subjective: 8/23  A/O 4,  Assessment/Plan: Sepsis urine (Escherichia coli)/GNR Bacteremia  -Continue Zosyn until speciation/susceptibility study  -DC vancomycin -Continue normal saline 74ml/hr; watch for fluid overload   NICM with systolic CHF and ICD, abnormal EKG -Continue to hold BP medication  - EKG shows T wave inversions not noted in March -Troponin 3  positive (most likely demand ischemia) -Echocardiogram pending  Prolonged QTc  -A chronic issue - has an ICD - avoid prolonging meds   Acute on Chronic kidney disease -Renal function improving w/ volume expansion  Atrial fibrillation on coumadin -Coumadin per pharmacy consult  -8/23 INR= 2.12 -Rate controlled   Hypokalemia -Potassium goal> 4 -Potassium IV 10 mEq 3 runs -Recheck potassium and magnesium at 1500; additional potassium IV 10 mEq 4 runs   Osteoarthritis -Oxycodone and tylenol for pain   Hypertension -Hold metoprolol in setting of hypotension/sepsis    Hyperlipidemia -Lipid panel pending  -not currently on a statin - address once acute illness improves  Hypothyroidism -Continue Synthroid 50 g daily  -TSH pending  Overactive bladder with incontinence -Wears depends and request these be started here - avoid foley in setting of acute sepsis from urinary source   Major depression, chronic -Increase cymbalta 30 mg daily   Code Status: FULL Family Communication: Daughter present at time of exam Disposition Plan: Resolution sepsis    Consultants: None  Procedure/Significant Events: NA   Culture 8/21 urine positive Escherichia coli 8/21 blood right arm/hand positive GNR 8/21 MRSA by PCR positive   Antibiotics: Zosyn 8/21 > Vanc 8/21 > stop 8/23    DVT prophylaxis: Warfarin   Devices NA   LINES / TUBES:  NA    Continuous Infusions: . sodium chloride 75 mL/hr at 12/16/14 0932    Objective: VITAL SIGNS: Temp: 98.9 F (37.2 C) (08/23 2028) Temp Source: Oral (08/23 2028) BP: 127/57 mmHg (08/23 2028) Pulse Rate: 94 (08/23 2028) SPO2;  FIO2:   Intake/Output Summary (Last 24 hours) at 12/16/14 2120 Last data filed at 12/16/14 1358  Gross per 24 hour  Intake 826.67 ml  Output      0 ml  Net 826.67 ml     Exam: General: A/O 4, No acute respiratory distress Eyes: Negative headache, eye pain, double  vision,negative scleral hemorrhage ENT: Negative Runny nose,negative gingival bleeding, Neck:  Negative scars, masses, torticollis, lymphadenopathy, JVD Lungs: Clear to auscultation bilaterally without wheezes or crackles Cardiovascular: Regular rhythm and rate without murmur gallop or rub normal S1 and S2 Abdomen: Negative abdominal pain, negative dysphagia, nondistended, positive soft, bowel sounds, no rebound, no ascites, no appreciable mass Extremities: No significant cyanosis, clubbing, or edema bilateral lower extremities Psychiatric:  Negative depression, negative anxiety, negative fatigue, negative mania  Neurologic:  Cranial nerves II through XII intact, tongue/uvula midline, all extremities muscle strength 5/5, sensation intact throughout, negative dysarthria, negative expressive aphasia, negative receptive aphasia.     Data Reviewed: Basic Metabolic Panel:  Recent Labs Lab 12/14/14 1614 12/15/14 0239 12/16/14 0327 12/16/14 1542  NA 137 141 140  --   K 3.8 4.2 3.0* 3.3*  CL 99* 108 107  --   CO2 26 24 22   --   GLUCOSE 144* 140* 119*  --   BUN 41* 40* 29*  --   CREATININE 2.60* 2.46* 2.04*  --   CALCIUM 8.1* 7.5* 7.8*  --   MG  --   --  2.1 2.2   Liver Function Tests:  Recent Labs Lab 12/14/14 2126 12/15/14 0239 12/16/14 0327  AST 25 23 20   ALT 18 17 16   ALKPHOS 76 78 71  BILITOT 1.9* 1.7* 1.1  PROT 6.1* 5.9* 6.0*  ALBUMIN 2.3* 2.3* 2.3*   No results for input(s): LIPASE, AMYLASE in the last 168 hours. No results for input(s): AMMONIA in the last 168 hours. CBC:  Recent Labs Lab 12/14/14 1614 12/15/14 0239 12/16/14 0327  WBC 7.9 10.6* 7.3  NEUTROABS 7.2  --   --   HGB 9.8* 8.2* 8.2*  HCT 31.6* 27.1* 26.7*  MCV 91.9 93.4 92.7  PLT 155 128* 121*   Cardiac Enzymes:  Recent Labs Lab 12/14/14 2126 12/15/14 0239 12/15/14 0814  TROPONINI 0.06* 0.04* 0.06*   BNP (last 3 results)  Recent Labs  07/18/14 0248  BNP 141.4*    ProBNP (last 3  results) No results for input(s): PROBNP in the last 8760 hours.  CBG: No results for input(s): GLUCAP in the last 168 hours.  Recent Results (from the past 240 hour(s))  Urine culture     Status: None (Preliminary result)   Collection Time: 12/14/14  4:05 PM  Result Value Ref Range Status   Specimen Description URINE, CATHETERIZED  Final   Special Requests NONE  Final   Culture >=100,000 COLONIES/mL ESCHERICHIA COLI  Final   Report Status PENDING  Incomplete  Blood Culture (routine x 2)     Status: None (Preliminary result)   Collection Time: 12/14/14  4:14 PM  Result Value Ref Range Status   Specimen Description BLOOD RIGHT ARM  Final   Special Requests BOTTLES DRAWN AEROBIC AND ANAEROBIC  Final   Culture  Setup Time   Final    GRAM NEGATIVE RODS IN BOTH AEROBIC AND ANAEROBIC BOTTLES CRITICAL RESULT CALLED TO, READ BACK BY AND VERIFIED WITH: TO MINGRAM(RN) BY [facility] 12/15/2014 AT 6:41AM    Culture GRAM NEGATIVE RODS  Final   Report Status PENDING  Incomplete  Blood Culture (routine x 2)     Status: None (Preliminary result)   Collection Time: 12/14/14  4:48 PM  Result Value Ref Range Status   Specimen Description BLOOD RIGHT HAND  Final   Special Requests BOTTLES DRAWN AEROBIC AND ANAEROBIC 10CC  Final   Culture  Setup Time   Final    GRAM NEGATIVE RODS IN BOTH AEROBIC AND ANAEROBIC BOTTLES CRITICAL RESULT CALLED TO, READ BACK BY AND VERIFIED WITH: TO MINGRAM(RN) BY [facility] 12/15/2014 AT 6:43    Culture GRAM NEGATIVE RODS  Final   Report Status PENDING  Incomplete  MRSA PCR Screening     Status: Abnormal   Collection Time: 12/14/14  8:35 PM  Result Value Ref Range Status   MRSA by PCR POSITIVE (A) NEGATIVE Final    Comment:        The GeneXpert MRSA Assay (FDA approved for NASAL specimens only), is one component of a comprehensive MRSA colonization surveillance program. It is not intended to diagnose MRSA infection nor to guide or monitor treatment for MRSA  infections. RESULT CALLED TO, READ BACK BY AND VERIFIED WITH: PETTIFORD,A RN 2244 12/14/14 MITCHELL,L      Studies:  Recent x-ray studies have been reviewed in detail by the Attending Physician  Scheduled Meds:  Scheduled Meds: . antiseptic oral rinse  7 mL Mouth Rinse BID  . Chlorhexidine Gluconate Cloth  6 each Topical Q0600  . [START ON 12/17/2014] DULoxetine  30 mg Oral Daily  . fluticasone  2 spray Each Nare QHS  . folic acid  1 mg Oral Daily  . levothyroxine  50 mcg Oral QAC breakfast  . loratadine  10 mg Oral Daily  . mirabegron ER  50 mg Oral Daily  . mupirocin ointment  1 application Nasal BID  . piperacillin-tazobactam (ZOSYN)  IV  3.375 g Intravenous 3 times per day  . potassium chloride  10 mEq Intravenous Q1 Hr x 4  . warfarin  3 mg Oral q1800  . Warfarin - Physician Dosing Inpatient   Does not apply q1800    Time spent on care of this patient: 40 mins   WOODS, Roselind Messier , MD  [facility] Office  [phone] Pager - [phone]  On-Call/Text Page:      [email or web address]      password [email or web address]  If 7PM-7AM, please contact night-coverage [email or web address] Password [email or web address] 12/16/2014, 9:20 PM   LOS: 2 days   Care during the described time interval was provided by me .  I have reviewed this patient's available data, including medical history, events of note, physical examination, and all test results as part of my evaluation. I have personally reviewed and interpreted all radiology studies.   Carolyne Littles, MD [phone] Pager

## 2014-12-16 NOTE — Progress Notes (Signed)
  Echocardiogram 2D Echocardiogram has been performed.  Margaret [name] 12/16/2014, 3:25 PM

## 2014-12-16 NOTE — Progress Notes (Signed)
ANTIBIOTIC CONSULT NOTE - FOLLOW UP  Pharmacy Consult for Vancomycin and Zosyn Indication: Sespsis / Bacteremia / UTI  No Known Allergies  Patient Measurements: Height: 5\' 5"  (165.1 cm) Weight: 216 lb 0.8 oz (98 kg) IBW/kg (Calculated) : 57  Vital Signs: Temp: 97.5 F (36.4 C) (08/23 0700) Temp Source: Oral (08/23 0700) BP: 122/60 mmHg (08/23 0700) Pulse Rate: 89 (08/23 0700) Intake/Output from previous day: 08/22 0701 - 08/23 0700 In: 2689.7 [P.O.:600; I.V.:1839.7; IV Piggyback:250] Out: 0  Intake/Output from this shift:    Labs:  Recent Labs  12/14/14 1614 12/15/14 0239 12/16/14 0327  WBC 7.9 10.6* 7.3  HGB 9.8* 8.2* 8.2*  PLT 155 128* 121*  CREATININE 2.60* 2.46* 2.04*   Estimated Creatinine Clearance: 28 mL/min (by C-G formula based on Cr of 2.04). No results for input(s): VANCOTROUGH, VANCOPEAK, VANCORANDOM, GENTTROUGH, GENTPEAK, GENTRANDOM, TOBRATROUGH, TOBRAPEAK, TOBRARND, AMIKACINPEAK, AMIKACINTROU, AMIKACIN in the last 72 hours.   Microbiology: Recent Results (from the past 720 hour(s))  Urine culture     Status: None (Preliminary result)   Collection Time: 12/14/14  4:05 PM  Result Value Ref Range Status   Specimen Description URINE, CATHETERIZED  Final   Special Requests NONE  Final   Culture >=100,000 COLONIES/mL ESCHERICHIA COLI  Final   Report Status PENDING  Incomplete  Blood Culture (routine x 2)     Status: None (Preliminary result)   Collection Time: 12/14/14  4:14 PM  Result Value Ref Range Status   Specimen Description BLOOD RIGHT ARM  Final   Special Requests BOTTLES DRAWN AEROBIC AND ANAEROBIC  Final   Culture  Setup Time   Final    GRAM NEGATIVE RODS IN BOTH AEROBIC AND ANAEROBIC BOTTLES CRITICAL RESULT CALLED TO, READ BACK BY AND VERIFIED WITH: TO MINGRAM(RN) BY [facility] 12/15/2014 AT 6:41AM    Culture GRAM NEGATIVE RODS  Final   Report Status PENDING  Incomplete  Blood Culture (routine x 2)     Status: None (Preliminary result)    Collection Time: 12/14/14  4:48 PM  Result Value Ref Range Status   Specimen Description BLOOD RIGHT HAND  Final   Special Requests BOTTLES DRAWN AEROBIC AND ANAEROBIC 10CC  Final   Culture  Setup Time   Final    GRAM NEGATIVE RODS IN BOTH AEROBIC AND ANAEROBIC BOTTLES CRITICAL RESULT CALLED TO, READ BACK BY AND VERIFIED WITH: TO MINGRAM(RN) BY [facility] 12/15/2014 AT 6:43    Culture GRAM NEGATIVE RODS  Final   Report Status PENDING  Incomplete  MRSA PCR Screening     Status: Abnormal   Collection Time: 12/14/14  8:35 PM  Result Value Ref Range Status   MRSA by PCR POSITIVE (A) NEGATIVE Final    Comment:        The GeneXpert MRSA Assay (FDA approved for NASAL specimens only), is one component of a comprehensive MRSA colonization surveillance program. It is not intended to diagnose MRSA infection nor to guide or monitor treatment for MRSA infections. RESULT CALLED TO, READ BACK BY AND VERIFIED WITH: PETTIFORD,A RN 2244 12/14/14 MITCHELL,L     Anti-infectives    Start     Dose/Rate Route Frequency Ordered Stop   12/15/14 1800  vancomycin (VANCOCIN) IVPB 1000 mg/200 mL premix     1,000 mg 200 mL/hr over 60 Minutes Intravenous Every 24 hours 12/14/14 1729     12/15/14 0500  vancomycin (VANCOCIN) IVPB 750 mg/150 ml premix  Status:  Discontinued     750 mg 150 mL/hr  over 60 Minutes Intravenous Every 12 hours 12/14/14 1644 12/14/14 1728   12/14/14 2200  piperacillin-tazobactam (ZOSYN) IVPB 3.375 g  Status:  Discontinued     3.375 g 12.5 mL/hr over 240 Minutes Intravenous 3 times per day 12/14/14 1644 12/14/14 1728   12/14/14 2200  piperacillin-tazobactam (ZOSYN) IVPB 2.25 g     2.25 g 100 mL/hr over 30 Minutes Intravenous 3 times per day 12/14/14 1729     12/14/14 1645  piperacillin-tazobactam (ZOSYN) IVPB 3.375 g     3.375 g 100 mL/hr over 30 Minutes Intravenous  Once 12/14/14 1630 12/14/14 1746   12/14/14 1645  vancomycin (VANCOCIN) IVPB 1000 mg/200 mL premix     1,000  mg 200 mL/hr over 60 Minutes Intravenous  Once 12/14/14 1630 12/14/14 1849      Assessment: 74 y/o resident of [name] presents with N/V, tremors, fever, tachycardia, and low BP. Abx started for sepsis. Now day #3 of abx for GNR bacteremia. Afebrile, WBC 10.6. Blood cx's are 2/2 with GNRs and urine cx has E. Coli. Awaiting sensitivities. SCr improved to 2.04, CrCl ~75ml/min.   Goal of Therapy:  Vancomycin trough level 15-20 mcg/ml  Resolution of infection  Plan:  Continue vancomycin 1g IV Q24  Change Zosyn to 3.375 gm IV q8h (4 hour infusion) Monitor clinical picture, renal function, VT prn F/U C&S, abx deescalation / LOT  Consider need for vancomycin as there are GNRs in blood and E. Coli in urine  Terel Bann J 12/16/2014,9:51 AM

## 2014-12-16 NOTE — Progress Notes (Signed)
  Courtesy visit from cardiology.  Family called to let me know she is in the hospital.  She had a prior cardiac arrest with anoxic encephalopathy and has baseline short term memory issues for years.  SHe was able to manage at home until recently when frequent falls necessitated movement to [facility] to help with care.   She used to have an indwelling foley prior to [facility] but this was removed. At one time she was on amiodarone but this was stopped because of tremor and falls.   She recognized me today.  She has chronic edema and also has some mild chronic dyspnea.  She was admitted with gram-negative sepsis and has positive blood cultures the organism yet to be identified.  Please let me know if I can be of assistance.  [name] MD [facility]

## 2014-12-17 ENCOUNTER — Inpatient Hospital Stay (HOSPITAL_COMMUNITY): Payer: Medicare Other

## 2014-12-17 DIAGNOSIS — A419 Sepsis, unspecified organism: Secondary | ICD-10-CM

## 2014-12-17 DIAGNOSIS — B9620 Unspecified Escherichia coli [E. coli] as the cause of diseases classified elsewhere: Secondary | ICD-10-CM

## 2014-12-17 DIAGNOSIS — R7881 Bacteremia: Secondary | ICD-10-CM

## 2014-12-17 DIAGNOSIS — N390 Urinary tract infection, site not specified: Secondary | ICD-10-CM

## 2014-12-17 DIAGNOSIS — I4891 Unspecified atrial fibrillation: Secondary | ICD-10-CM

## 2014-12-17 DIAGNOSIS — N179 Acute kidney failure, unspecified: Secondary | ICD-10-CM

## 2014-12-17 DIAGNOSIS — N189 Chronic kidney disease, unspecified: Secondary | ICD-10-CM

## 2014-12-17 LAB — CBC WITH DIFFERENTIAL/PLATELET
BASOS ABS: 0 10*3/uL (ref 0.0–0.1)
BASOS PCT: 1 % (ref 0–1)
EOS PCT: 5 % (ref 0–5)
Eosinophils Absolute: 0.3 10*3/uL (ref 0.0–0.7)
HEMATOCRIT: 27.7 % — AB (ref 36.0–46.0)
Hemoglobin: 8.4 g/dL — ABNORMAL LOW (ref 12.0–15.0)
Lymphocytes Relative: 17 % (ref 12–46)
Lymphs Abs: 1 10*3/uL (ref 0.7–4.0)
MCH: 28.1 pg (ref 26.0–34.0)
MCHC: 30.3 g/dL (ref 30.0–36.0)
MCV: 92.6 fL (ref 78.0–100.0)
MONO ABS: 0.8 10*3/uL (ref 0.1–1.0)
MONOS PCT: 13 % — AB (ref 3–12)
NEUTROS ABS: 3.9 10*3/uL (ref 1.7–7.7)
Neutrophils Relative %: 64 % (ref 43–77)
PLATELETS: 149 10*3/uL — AB (ref 150–400)
RBC: 2.99 MIL/uL — ABNORMAL LOW (ref 3.87–5.11)
RDW: 15.8 % — AB (ref 11.5–15.5)
WBC: 6.1 10*3/uL (ref 4.0–10.5)

## 2014-12-17 LAB — COMPREHENSIVE METABOLIC PANEL
ALBUMIN: 2.3 g/dL — AB (ref 3.5–5.0)
ALK PHOS: 74 U/L (ref 38–126)
ALT: 16 U/L (ref 14–54)
ANION GAP: 10 (ref 5–15)
AST: 22 U/L (ref 15–41)
BILIRUBIN TOTAL: 0.8 mg/dL (ref 0.3–1.2)
BUN: 20 mg/dL (ref 6–20)
CALCIUM: 8.3 mg/dL — AB (ref 8.9–10.3)
CO2: 23 mmol/L (ref 22–32)
CREATININE: 1.71 mg/dL — AB (ref 0.44–1.00)
Chloride: 106 mmol/L (ref 101–111)
GFR calc Af Amer: 33 mL/min — ABNORMAL LOW (ref >60)
GFR calc non Af Amer: 28 mL/min — ABNORMAL LOW (ref >60)
GLUCOSE: 100 mg/dL — AB (ref 65–99)
Potassium: 3.7 mmol/L (ref 3.5–5.1)
SODIUM: 139 mmol/L (ref 135–145)
TOTAL PROTEIN: 6.5 g/dL (ref 6.5–8.1)

## 2014-12-17 LAB — LIPID PANEL
CHOLESTEROL: 143 mg/dL (ref 0–200)
HDL: 22 mg/dL — ABNORMAL LOW (ref >40)
LDL Cholesterol: 95 mg/dL (ref 0–99)
TRIGLYCERIDES: 131 mg/dL (ref <150)
Total CHOL/HDL Ratio: 6.5 RATIO
VLDL: 26 mg/dL (ref 0–40)

## 2014-12-17 LAB — URINE CULTURE

## 2014-12-17 LAB — AMMONIA: AMMONIA: 23 umol/L (ref 9–35)

## 2014-12-17 LAB — TSH: TSH: 7.336 u[IU]/mL — ABNORMAL HIGH (ref 0.350–4.500)

## 2014-12-17 LAB — MAGNESIUM: Magnesium: 2.1 mg/dL (ref 1.7–2.4)

## 2014-12-17 MED ORDER — FUROSEMIDE 80 MG PO TABS
80.0000 mg | ORAL_TABLET | Freq: Two times a day (BID) | ORAL | Status: DC
  Administered 2014-12-17 – 2014-12-18 (×2): 80 mg via ORAL
  Filled 2014-12-17 (×2): qty 1

## 2014-12-17 MED ORDER — POTASSIUM CHLORIDE CRYS ER 20 MEQ PO TBCR
40.0000 meq | EXTENDED_RELEASE_TABLET | Freq: Two times a day (BID) | ORAL | Status: DC
  Administered 2014-12-17 – 2014-12-18 (×2): 40 meq via ORAL
  Filled 2014-12-17 (×2): qty 2

## 2014-12-17 MED ORDER — WARFARIN - PHARMACIST DOSING INPATIENT: Freq: Every day | Status: DC

## 2014-12-17 MED ORDER — POTASSIUM CHLORIDE 10 MEQ/100ML IV SOLN
INTRAVENOUS | Status: AC
  Administered 2014-12-17: 10 meq
  Filled 2014-12-17: qty 100

## 2014-12-17 MED ORDER — SODIUM CHLORIDE 0.9 % IV SOLN
250.0000 mg | Freq: Four times a day (QID) | INTRAVENOUS | Status: DC
  Administered 2014-12-17 – 2014-12-18 (×3): 250 mg via INTRAVENOUS
  Filled 2014-12-17 (×5): qty 250

## 2014-12-17 NOTE — Care Management Important Message (Signed)
Important Message  Patient Details  Name: Margaret [name] MRN: [id number] Date of Birth: 1940/10/21   Medicare Important Message Given:  Yes-second notification given    Leone Haven, RN 12/17/2014, 10:36 AMImportant Message  Patient Details  Name: Margaret [name] MRN: [id number] Date of Birth: 20-Nov-1940   Medicare Important Message Given:  Yes-second notification given    Leone Haven, RN 12/17/2014, 10:36 AM

## 2014-12-17 NOTE — Progress Notes (Addendum)
ANTIBIOTIC CONSULT NOTE - INITIAL  Pharmacy Consult:  Primaxin Indication:  Bacteremia  No Known Allergies  Patient Measurements: Height: 5\' 5"  (165.1 cm) Weight: 228 lb 11.2 oz (103.738 kg) IBW/kg (Calculated) : 57  Vital Signs: Temp: 99 F (37.2 C) (08/24 1423) Temp Source: Oral (08/24 1423) BP: 106/62 mmHg (08/24 1423) Pulse Rate: 72 (08/24 1423) Intake/Output from previous day: 08/23 0701 - 08/24 0700 In: 1507.1 [P.O.:360; I.V.:897.1; IV Piggyback:250] Out: -  Intake/Output from this shift: Total I/O In: 360 [P.O.:360] Out: -   Labs:  Recent Labs  12/15/14 0239 12/16/14 0327 12/17/14 0514  WBC 10.6* 7.3 6.1  HGB 8.2* 8.2* 8.4*  PLT 128* 121* 149*  CREATININE 2.46* 2.04* 1.71*   Estimated Creatinine Clearance: 34.5 mL/min (by C-G formula based on Cr of 1.71). No results for input(s): VANCOTROUGH, VANCOPEAK, VANCORANDOM, GENTTROUGH, GENTPEAK, GENTRANDOM, TOBRATROUGH, TOBRAPEAK, TOBRARND, AMIKACINPEAK, AMIKACINTROU, AMIKACIN in the last 72 hours.   Microbiology: Recent Results (from the past 720 hour(s))  Urine culture     Status: None   Collection Time: 12/14/14  4:05 PM  Result Value Ref Range Status   Specimen Description URINE, CATHETERIZED  Final   Special Requests NONE  Final   Culture   Final    >=100,000 COLONIES/mL ESCHERICHIA COLI Confirmed Extended Spectrum Beta-Lactamase Producer (ESBL)    Report Status 12/17/2014 FINAL  Final   Organism ID, Bacteria ESCHERICHIA COLI  Final      Susceptibility   Escherichia coli - MIC*    AMPICILLIN >=32 RESISTANT Resistant     CEFAZOLIN >=64 RESISTANT Resistant     CEFTRIAXONE >=64 RESISTANT Resistant     CIPROFLOXACIN >=4 RESISTANT Resistant     GENTAMICIN >=16 RESISTANT Resistant     IMIPENEM <=0.25 SENSITIVE Sensitive     NITROFURANTOIN 32 SENSITIVE Sensitive     TRIMETH/SULFA <=20 SENSITIVE Sensitive     AMPICILLIN/SULBACTAM >=32 RESISTANT Resistant     PIP/TAZO 64 INTERMEDIATE Intermediate     *  >=100,000 COLONIES/mL ESCHERICHIA COLI  Blood Culture (routine x 2)     Status: None (Preliminary result)   Collection Time: 12/14/14  4:14 PM  Result Value Ref Range Status   Specimen Description BLOOD RIGHT ARM  Final   Special Requests BOTTLES DRAWN AEROBIC AND ANAEROBIC  Final   Culture  Setup Time   Final    GRAM NEGATIVE RODS IN BOTH AEROBIC AND ANAEROBIC BOTTLES CRITICAL RESULT CALLED TO, READ BACK BY AND VERIFIED WITH: TO MINGRAM(RN) BY [facility] 12/15/2014 AT 6:41AM    Culture   Final    ESCHERICHIA COLI Confirmed Extended Spectrum Beta-Lactamase Producer (ESBL)    Report Status PENDING  Incomplete   Organism ID, Bacteria ESCHERICHIA COLI  Final      Susceptibility   Escherichia coli - MIC*    AMPICILLIN >=32 RESISTANT Resistant     CEFAZOLIN >=64 RESISTANT Resistant     CEFEPIME >=64 RESISTANT Resistant     CEFTAZIDIME >=64 RESISTANT Resistant     CEFTRIAXONE >=64 RESISTANT Resistant     CIPROFLOXACIN >=4 RESISTANT Resistant     GENTAMICIN >=16 RESISTANT Resistant     IMIPENEM <=0.25 SENSITIVE Sensitive     TRIMETH/SULFA <=20 SENSITIVE Sensitive     AMPICILLIN/SULBACTAM >=32 RESISTANT Resistant     PIP/TAZO 64 INTERMEDIATE Intermediate     * ESCHERICHIA COLI  Blood Culture (routine x 2)     Status: None (Preliminary result)   Collection Time: 12/14/14  4:48 PM  Result Value Ref Range  Status   Specimen Description BLOOD RIGHT HAND  Final   Special Requests BOTTLES DRAWN AEROBIC AND ANAEROBIC 10CC  Final   Culture  Setup Time   Final    GRAM NEGATIVE RODS IN BOTH AEROBIC AND ANAEROBIC BOTTLES CRITICAL RESULT CALLED TO, READ BACK BY AND VERIFIED WITH: TO MINGRAM(RN) BY [facility] 12/15/2014 AT 6:43    Culture   Final    ESCHERICHIA COLI SUSCEPTIBILITIES PERFORMED ON PREVIOUS CULTURE WITHIN THE LAST 5 DAYS.    Report Status PENDING  Incomplete  MRSA PCR Screening     Status: Abnormal   Collection Time: 12/14/14  8:35 PM  Result Value Ref Range Status   MRSA by  PCR POSITIVE (A) NEGATIVE Final    Comment:        The GeneXpert MRSA Assay (FDA approved for NASAL specimens only), is one component of a comprehensive MRSA colonization surveillance program. It is not intended to diagnose MRSA infection nor to guide or monitor treatment for MRSA infections. RESULT CALLED TO, READ BACK BY AND VERIFIED WITH: PETTIFORD,A RN 2244 12/14/14 MITCHELL,L     Medical History: Past Medical History  Diagnosis Date  . NICM (nonischemic cardiomyopathy)     EF 35%; cath 2/12 no CAD  . Systolic CHF, chronic   . Severe mitral regurgitation     s/p complex mitral valve repair with cox maze + LAA clipping, removal of RV lead, and implantation of CRT-D in abdomen  . Cardiac arrest - ventricular fibrillation     in 1990s  . Atrial fibrillation     amiodarone  . HTN (hypertension)   . HLD (hyperlipidemia)   . Achalasia     s/p dilation  . Recurrent UTI   . Obesity   . Anoxic brain injury 1996    s/p cardiac arrest   . Automatic implantable cardioverter-defibrillator in situ   . Hypothyroidism   . TIA (transient ischemic attack)   . Chronic kidney disease (CKD), stage IV (severe)     [name] 10/03/2013      Assessment: 79 YOF with ESBL E.coli bacteremia and UTI to switch from Zosyn to Primaxin.  Patient's renal function is improving.  Vanc 8/21 >> 8/24 Zosyn 8/21 >> 8/24 Primaxin 8/24 >>  8/21 MRSA PCR - positive 8/21 Blood cx - E.coli (S: Imipenem, Septra) 8/21 Urine cx - > 100k of E.coli (S: Imipenem, NTF, Septra)   Goal of Therapy:  Resolution of infection   Plan:  - Primaxin  IV Q6H - Monitor renal fxn, clinical progress   Corean Yoshimura D. Laney Potash, PharmD, BCPS Pager:  [phone] 12/17/2014, 5:22 PM    ====================   Addendum: - switch from Coumadin per MD to per Pharmacy - INR therapeutic yesterday, no INR today   Plan: - No change for tonight as Coumadin dose is already given, continue Coumadin  PO daily for now -  F/U INR in AM for future dosing    Toby Ayad D. Laney Potash, PharmD, BCPS Pager:  [phone] 12/17/2014, 9:17 PM

## 2014-12-17 NOTE — Clinical Social Work Note (Signed)
Clinical Social Work Assessment  Patient Details  Name: Margaret [name] MRN: [id number] Date of Birth: 10/01/1940  Date of referral:  12/17/14               Reason for consult:  Facility Placement                Permission sought to share information with:  Margaret [name] granted to share information::  Yes, Verbal Permission Granted  Name::        Agency::  [name]  Relationship::     Contact Information:     Housing/Transportation Living arrangements for the past 2 months:  Skilled Building surveyor of Information:  Patient Patient Interpreter Needed:  None Criminal Activity/Legal Involvement Pertinent to Current Situation/Hospitalization:  No - Comment as needed Significant Relationships:  Adult Children Lives with:  Facility Resident Do you feel safe going back to the [name] where you live?  Yes Need for family participation in patient care:  No (Coment)  Care giving concerns:  CSW notified pt admitted from facility   Social Worker assessment / plan:  CSW spoke with pt at bedside and confirmed pt was admitted from [name]. Pt reports that she has been a resident at [name] for a little over a month now and is a long term resident at the facility. Pt reports no concerns with the facility and is agreeable to returning. Pt informed CSW her family has been at the hospital and are also aware of plan.  Employment status:    Insurance information:  Medicare, Managed Care PT Recommendations:  Skilled Nursing Facility Information / Referral to community resources:   (None needed at this time)  Patient/Family's Response to care:  Pt agreeable to returning to facility at dc.  Patient/Family's Understanding of and Emotional Response to Diagnosis, Current Treatment, and Prognosis:  Pt has a fair understanding of her diagnosis. Pt with appropriate emotional response to hospitalization and expressed she is ready to return to  facility.  Emotional Assessment Appearance:  Well-Groomed Attitude/Demeanor/Rapport:  Other (Cooperative) Affect (typically observed):  Calm, Pleasant Orientation:  Oriented to Self, Oriented to [name], Oriented to  Time, Oriented to Situation Alcohol / Substance use:  Not Applicable Psych involvement (Current and /or in the community):  No (Comment)  Discharge Needs  Concerns to be addressed:  Discharge Planning Concerns Readmission within the last 30 days:  No Current discharge risk:  None Barriers to Discharge:  Continued Medical Work up   Margaret [name], Margaret [name] [phone]

## 2014-12-17 NOTE — Care Management Note (Addendum)
Case Management Note  Patient Details  Name: Margaret [name] MRN: [id number] Date of Birth: 08-Sep-1940  Subjective/Objective:    Patient is from a snf and transfer from [facility], plan is to go back to a snf, pt eval ordered, CSW following.                 Action/Plan:   Expected Discharge Date:                  Expected Discharge Plan:  Skilled Nursing Facility  In-House Referral:  Clinical Social Work  Discharge planning Services  CM Consult  Post Acute Care Choice:    Choice offered to:     DME Arranged:    DME Agency:     HH Arranged:    HH Agency:     Status of Service:  In process, will continue to follow  Medicare Important Message Given:    Date Medicare IM Given:    Medicare IM give by:    Date Additional Medicare IM Given:    Additional Medicare Important Message give by:     If discussed at Long Length of Stay Meetings, dates discussed:    Additional Comments:  Leone Haven, RN 12/17/2014, 10:34 AM

## 2014-12-17 NOTE — Progress Notes (Addendum)
PATIENT DETAILS Name: Margaret [name] Age: 74 y.o. Sex: female Date of Birth: 1940-09-02 Admit Date: 12/14/2014 Admitting Physician Inez Catalina, MD [id number] Venetia Constable, MD  Subjective: Feels better-has generalized weakness.  Assessment/Plan: Principal Problem: Sepsis: Secondary to Escherichia coli bacteremia and UTI. Sepsis pathophysiology has resolved. Continue antibiotics.  Active Problems: Escherichia coli UTI and bacteremia: Urine/blood cultures positive for Escherichia coli-resistant to multiple drugs-Will Change to Primaxin-because of CKD stage 3/4-likely will not be able to switch to Bactrim-will discuss with ID tomorrow.  Acute on chronic kidney disease stage III: Acute renal failure likely secondary to prerenal azotemia, much improved, now close to usual baseline.  Chronic systolic CHF: Compensated, resume Lasix at half dose. Weight has gone up 20 pounds. Resume beta blocker when able, not a candidate for ACE inhibitor given CKD.  Chronic atrial fibrillation: Rate controlled, metoprolol on hold for now-Coumadin per pharmacy-INR therapeutic.  Atomatic implantable cardioverter-defibrillator in situ  Hypothyroidism: Continue Synthroid  Overactive bladder with incontinence:Continue myrbetriq. Recently had a chronic indwelling Foley catheter that was removed a few weeks ago.  Major depression, chronic: Continue cymbalta  Disposition: Remain inpatient-suspect SNF on discharge in next 1-2 days  Antimicrobial agents  See below  Anti-infectives    Start     Dose/Rate Route Frequency Ordered Stop   12/16/14 1400  piperacillin-tazobactam (ZOSYN) IVPB 3.375 g     3.375 g 12.5 mL/hr over 240 Minutes Intravenous 3 times per day 12/16/14 0955     12/15/14 1800  vancomycin (VANCOCIN) IVPB 1000 mg/200 mL premix  Status:  Discontinued     1,000 mg 200 mL/hr over 60 Minutes Intravenous Every 24 hours 12/14/14 1729 12/16/14 1017   12/15/14 0500  vancomycin  (VANCOCIN) IVPB 750 mg/150 ml premix  Status:  Discontinued     750 mg 150 mL/hr over 60 Minutes Intravenous Every 12 hours 12/14/14 1644 12/14/14 1728   12/14/14 2200  piperacillin-tazobactam (ZOSYN) IVPB 3.375 g  Status:  Discontinued     3.375 g 12.5 mL/hr over 240 Minutes Intravenous 3 times per day 12/14/14 1644 12/14/14 1728   12/14/14 2200  piperacillin-tazobactam (ZOSYN) IVPB 2.25 g  Status:  Discontinued     2.25 g 100 mL/hr over 30 Minutes Intravenous 3 times per day 12/14/14 1729 12/16/14 0955   12/14/14 1645  piperacillin-tazobactam (ZOSYN) IVPB 3.375 g     3.375 g 100 mL/hr over 30 Minutes Intravenous  Once 12/14/14 1630 12/14/14 1746   12/14/14 1645  vancomycin (VANCOCIN) IVPB 1000 mg/200 mL premix     1,000 mg 200 mL/hr over 60 Minutes Intravenous  Once 12/14/14 1630 12/14/14 1849      DVT Prophylaxis: Coumadin  Code Status: Full code   Family Communication Daughter at bedside  Procedures: None  CONSULTS:  None  Time spent 30 minutes-Greater than 50% of this time was spent in counseling, explanation of diagnosis, planning of further management, and coordination of care.  MEDICATIONS: Scheduled Meds: . antiseptic oral rinse  7 mL Mouth Rinse BID  . Chlorhexidine Gluconate Cloth  6 each Topical Q0600  . DULoxetine  30 mg Oral Daily  . fluticasone  2 spray Each Nare QHS  . folic acid  1 mg Oral Daily  . levothyroxine  50 mcg Oral QAC breakfast  . loratadine  10 mg Oral Daily  . mirabegron ER  50 mg Oral Daily  . mupirocin ointment  1 application Nasal BID  .  piperacillin-tazobactam (ZOSYN)  IV  3.375 g Intravenous 3 times per day  . warfarin  3 mg Oral q1800  . Warfarin - Physician Dosing Inpatient   Does not apply q1800   Continuous Infusions: . sodium chloride 50 mL/hr (12/17/14 0353)   PRN Meds:.acetaminophen, alum & mag hydroxide-simeth, ondansetron, oxyCODONE, senna-docusate    PHYSICAL EXAM: Vital signs in last 24 hours: Filed Vitals:    12/16/14 2333 12/17/14 0005 12/17/14 0532 12/17/14 1423  BP: 117/57 123/61 145/73 106/62  Pulse: 110 95 93 72  Temp: 98.2 F (36.8 C) 98.5 F (36.9 C) 98.4 F (36.9 C) 99 F (37.2 C)  TempSrc: Oral Oral Oral Oral  Resp: Height:   (1.651 m)    Weight:  103.738 kg (228 lb 11.2 oz)    SpO2: 98% 97% 100% 98%    Weight change: 5.738 kg (12 lb 10.4 oz) Filed Weights   12/15/14 0500 12/16/14 0500 12/17/14 0005  Weight: 96.163 kg (212 lb) 98 kg (216 lb 0.8 oz) 103.738 kg (228 lb 11.2 oz)   Body mass index is 38.06 kg/(m^2).   Gen Exam: Awake and alert with clear speech.   Neck: Supple, No JVD.   Chest: B/L Clear.   CVS: S1 S2 Regular, no murmurs.  Abdomen: soft, BS +, non tender, non distended. Extremities: ++ edema, lower extremities warm to touch. Neurologic: Non Focal.   Skin: No Rash.   Wounds: N/A.    Intake/Output from previous day:  Intake/Output Summary (Last 24 hours) at 12/17/14 1700 Last data filed at 12/17/14 1041  Gross per 24 hour  Intake 1577.08 ml  Output      0 ml  Net 1577.08 ml     LAB RESULTS: CBC  Recent Labs Lab 12/14/14 1614 12/15/14 0239 12/16/14 0327 12/17/14 0514  WBC 7.9 10.6* 7.3 6.1  HGB 9.8* 8.2* 8.2* 8.4*  HCT 31.6* 27.1* 26.7* 27.7*  PLT 155 128* 121* 149*  MCV 91.9 93.4 92.7 92.6  MCH 28.5 28.3 28.5 28.1  MCHC 31.0 30.3 30.7 30.3  RDW 15.9* 16.2* 16.0* 15.8*  LYMPHSABS 0.3*  --   --  1.0  MONOABS 0.4  --   --  0.8  EOSABS 0.0  --   --  0.3  BASOSABS 0.0  --   --  0.0    Chemistries   Recent Labs Lab 12/14/14 1614 12/15/14 0239 12/16/14 0327 12/16/14 1542 12/17/14 0514  NA 137 141 140  --  139  K 3.8 4.2 3.0* 3.3* 3.7  CL 99* 108 107  --  106  CO2 --  23  GLUCOSE 144* 140* 119*  --  100*  BUN 41* 40* 29*  --  20  CREATININE 2.60* 2.46* 2.04*  --  1.71*  CALCIUM 8.1* 7.5* 7.8*  --  8.3*  MG  --   --  2.1 2.2 2.1    CBG: No results for input(s): GLUCAP in the last 168  hours.  GFR Estimated Creatinine Clearance: 34.5 mL/min (by C-G formula based on Cr of 1.71).  Coagulation profile  Recent Labs Lab 12/14/14 2112 12/15/14 0239 12/16/14 0327  INR 1.85* 1.80* 2.12*    Cardiac Enzymes  Recent Labs Lab 12/14/14 2126 12/15/14 0239 12/15/14 0814  TROPONINI 0.06* 0.04* 0.06*    Invalid input(s): POCBNP No results for input(s): DDIMER in the last 72 hours.  Recent Labs  12/15/14 0239  HGBA1C 5.9*    Recent  Labs  12/17/14 0514  CHOL 143  HDL 22*  LDLCALC 95  TRIG 782  CHOLHDL 6.5    Recent Labs  12/17/14 0514  TSH 7.336*   No results for input(s): VITAMINB12, FOLATE, FERRITIN, TIBC, IRON, RETICCTPCT in the last 72 hours. No results for input(s): LIPASE, AMYLASE in the last 72 hours.  Urine Studies No results for input(s): UHGB, CRYS in the last 72 hours.  Invalid input(s): UACOL, UAPR, USPG, UPH, UTP, UGL, UKET, UBIL, UNIT, UROB, ULEU, UEPI, UWBC, URBC, UBAC, CAST, UCOM, BILUA  MICROBIOLOGY: Recent Results (from the past 240 hour(s))  Urine culture     Status: None   Collection Time: 12/14/14  4:05 PM  Result Value Ref Range Status   Specimen Description URINE, CATHETERIZED  Final   Special Requests NONE  Final   Culture   Final    >=100,000 COLONIES/mL ESCHERICHIA COLI Confirmed Extended Spectrum Beta-Lactamase Producer (ESBL)    Report Status 12/17/2014 FINAL  Final   Organism ID, Bacteria ESCHERICHIA COLI  Final      Susceptibility   Escherichia coli - MIC*    AMPICILLIN >=32 RESISTANT Resistant     CEFAZOLIN >=64 RESISTANT Resistant     CEFTRIAXONE >=64 RESISTANT Resistant     CIPROFLOXACIN >=4 RESISTANT Resistant     GENTAMICIN >=16 RESISTANT Resistant     IMIPENEM <=0.25 SENSITIVE Sensitive     NITROFURANTOIN 32 SENSITIVE Sensitive     TRIMETH/SULFA <=20 SENSITIVE Sensitive     AMPICILLIN/SULBACTAM >=32 RESISTANT Resistant     PIP/TAZO 64 INTERMEDIATE Intermediate     * >=100,000 COLONIES/mL ESCHERICHIA  COLI  Blood Culture (routine x 2)     Status: None (Preliminary result)   Collection Time: 12/14/14  4:14 PM  Result Value Ref Range Status   Specimen Description BLOOD RIGHT ARM  Final   Special Requests BOTTLES DRAWN AEROBIC AND ANAEROBIC  Final   Culture  Setup Time   Final    GRAM NEGATIVE RODS IN BOTH AEROBIC AND ANAEROBIC BOTTLES CRITICAL RESULT CALLED TO, READ BACK BY AND VERIFIED WITH: TO MINGRAM(RN) BY TCLEVELAND 12/15/2014 AT 6:41AM    Culture   Final    ESCHERICHIA COLI Confirmed Extended Spectrum Beta-Lactamase Producer (ESBL)    Report Status PENDING  Incomplete   Organism ID, Bacteria ESCHERICHIA COLI  Final      Susceptibility   Escherichia coli - MIC*    AMPICILLIN >=32 RESISTANT Resistant     CEFAZOLIN >=64 RESISTANT Resistant     CEFEPIME >=64 RESISTANT Resistant     CEFTAZIDIME >=64 RESISTANT Resistant     CEFTRIAXONE >=64 RESISTANT Resistant     CIPROFLOXACIN >=4 RESISTANT Resistant     GENTAMICIN >=16 RESISTANT Resistant     IMIPENEM <=0.25 SENSITIVE Sensitive     TRIMETH/SULFA <=20 SENSITIVE Sensitive     AMPICILLIN/SULBACTAM >=32 RESISTANT Resistant     PIP/TAZO 64 INTERMEDIATE Intermediate     * ESCHERICHIA COLI  Blood Culture (routine x 2)     Status: None (Preliminary result)   Collection Time: 12/14/14  4:48 PM  Result Value Ref Range Status   Specimen Description BLOOD RIGHT HAND  Final   Special Requests BOTTLES DRAWN AEROBIC AND ANAEROBIC 10CC  Final   Culture  Setup Time   Final    GRAM NEGATIVE RODS IN BOTH AEROBIC AND ANAEROBIC BOTTLES CRITICAL RESULT CALLED TO, READ BACK BY AND VERIFIED WITH: TO MINGRAM(RN) BY [facility] 12/15/2014 AT 6:43    Culture   Final  ESCHERICHIA COLI SUSCEPTIBILITIES PERFORMED ON PREVIOUS CULTURE WITHIN THE LAST 5 DAYS.    Report Status PENDING  Incomplete  MRSA PCR Screening     Status: Abnormal   Collection Time: 12/14/14  8:35 PM  Result Value Ref Range Status   MRSA by PCR POSITIVE (A) NEGATIVE Final     Comment:        The GeneXpert MRSA Assay (FDA approved for NASAL specimens only), is one component of a comprehensive MRSA colonization surveillance program. It is not intended to diagnose MRSA infection nor to guide or monitor treatment for MRSA infections. RESULT CALLED TO, READ BACK BY AND VERIFIED WITH: PETTIFORD,A RN 2244 12/14/14 MITCHELL,L     RADIOLOGY STUDIES/RESULTS: Dg Chest Port 1 View  12/17/2014   CLINICAL DATA:  Shortness of breath.  EXAM: PORTABLE CHEST - 1 VIEW  COMPARISON:  December 14, 2014.  FINDINGS: Stable cardiomediastinal silhouette. Stable sternotomy wires and leads are noted. No pneumothorax or pleural effusion is noted. No acute pulmonary disease is noted. Bony thorax is intact.  IMPRESSION: No acute cardiopulmonary abnormality seen.   Electronically Signed   By: Lupita Raider, M.D.   On: 12/17/2014 07:19   Dg Chest Port 1 View  12/14/2014   CLINICAL DATA:  Nausea and vomiting for 2 days  EXAM: PORTABLE CHEST - 1 VIEW  COMPARISON:  12/17/2013  FINDINGS: Cardiac shadow is mildly enlarged but stable. Multiple leads are again identified over the cardiac shadow. The lungs are well aerated. Mild vascular congestion is noted. No focal confluent infiltrate is seen.  IMPRESSION: Mild vascular congestion   Electronically Signed   By: Alcide Clever M.D.   On: 12/14/2014 17:15    Jeoffrey Massed, MD  [facility] Pager:[phone]  If 7PM-7AM, please contact night-coverage [email or web address] Password [email or web address] 12/17/2014, 5:00 PM   LOS: 3 days

## 2014-12-17 NOTE — Progress Notes (Signed)
[facility] notified that pt has crackles in lower bases. Ilean Skill LPN

## 2014-12-18 DIAGNOSIS — A4151 Sepsis due to Escherichia coli [E. coli]: Principal | ICD-10-CM

## 2014-12-18 DIAGNOSIS — I482 Chronic atrial fibrillation: Secondary | ICD-10-CM

## 2014-12-18 DIAGNOSIS — L8990 Pressure ulcer of unspecified site, unspecified stage: Secondary | ICD-10-CM | POA: Insufficient documentation

## 2014-12-18 LAB — COMPREHENSIVE METABOLIC PANEL
ALT: 16 U/L (ref 14–54)
AST: 22 U/L (ref 15–41)
Albumin: 2.3 g/dL — ABNORMAL LOW (ref 3.5–5.0)
Alkaline Phosphatase: 71 U/L (ref 38–126)
Anion gap: 9 (ref 5–15)
BILIRUBIN TOTAL: 1 mg/dL (ref 0.3–1.2)
BUN: 16 mg/dL (ref 6–20)
CALCIUM: 8.5 mg/dL — AB (ref 8.9–10.3)
CHLORIDE: 105 mmol/L (ref 101–111)
CO2: 27 mmol/L (ref 22–32)
CREATININE: 1.59 mg/dL — AB (ref 0.44–1.00)
GFR, EST AFRICAN AMERICAN: 36 mL/min — AB (ref >60)
GFR, EST NON AFRICAN AMERICAN: 31 mL/min — AB (ref >60)
Glucose, Bld: 110 mg/dL — ABNORMAL HIGH (ref 65–99)
Potassium: 3.3 mmol/L — ABNORMAL LOW (ref 3.5–5.1)
Sodium: 141 mmol/L (ref 135–145)
TOTAL PROTEIN: 6.7 g/dL (ref 6.5–8.1)

## 2014-12-18 LAB — CBC WITH DIFFERENTIAL/PLATELET
BASOS ABS: 0.1 10*3/uL (ref 0.0–0.1)
Basophils Relative: 1 % (ref 0–1)
EOS ABS: 0.2 10*3/uL (ref 0.0–0.7)
EOS PCT: 4 % (ref 0–5)
HEMATOCRIT: 27.2 % — AB (ref 36.0–46.0)
HEMOGLOBIN: 8.5 g/dL — AB (ref 12.0–15.0)
LYMPHS ABS: 1.2 10*3/uL (ref 0.7–4.0)
Lymphocytes Relative: 19 % (ref 12–46)
MCH: 29.1 pg (ref 26.0–34.0)
MCHC: 31.3 g/dL (ref 30.0–36.0)
MCV: 93.2 fL (ref 78.0–100.0)
MONO ABS: 0.7 10*3/uL (ref 0.1–1.0)
Monocytes Relative: 12 % (ref 3–12)
NEUTROS PCT: 64 % (ref 43–77)
Neutro Abs: 3.9 10*3/uL (ref 1.7–7.7)
Platelets: 160 10*3/uL (ref 150–400)
RBC: 2.92 MIL/uL — AB (ref 3.87–5.11)
RDW: 15.7 % — ABNORMAL HIGH (ref 11.5–15.5)
WBC: 6.1 10*3/uL (ref 4.0–10.5)

## 2014-12-18 LAB — PROTIME-INR
INR: 2.55 — ABNORMAL HIGH (ref 0.00–1.49)
Prothrombin Time: 27.1 seconds — ABNORMAL HIGH (ref 11.6–15.2)

## 2014-12-18 LAB — CULTURE, BLOOD (ROUTINE X 2)

## 2014-12-18 MED ORDER — METOPROLOL TARTRATE 12.5 MG HALF TABLET
12.5000 mg | ORAL_TABLET | Freq: Two times a day (BID) | ORAL | Status: DC
  Administered 2014-12-18: 12.5 mg via ORAL
  Filled 2014-12-18: qty 1

## 2014-12-18 MED ORDER — SULFAMETHOXAZOLE-TRIMETHOPRIM 400-80 MG PO TABS: 1.0000 | ORAL_TABLET | Freq: Two times a day (BID) | ORAL | Status: DC

## 2014-12-18 MED ORDER — SULFAMETHOXAZOLE-TRIMETHOPRIM 400-80 MG PO TABS
1.0000 | ORAL_TABLET | Freq: Two times a day (BID) | ORAL | Status: DC
  Administered 2014-12-18: 1 via ORAL
  Filled 2014-12-18 (×2): qty 1

## 2014-12-18 MED ORDER — POLYETHYLENE GLYCOL 3350 17 G PO PACK
17.0000 g | PACK | Freq: Two times a day (BID) | ORAL | Status: DC
Start: 2014-12-18 — End: 2014-12-18
  Administered 2014-12-18: 17 g via ORAL
  Filled 2014-12-18: qty 1

## 2014-12-18 MED ORDER — SENNOSIDES-DOCUSATE SODIUM 8.6-50 MG PO TABS: 1.0000 | ORAL_TABLET | Freq: Every evening | ORAL | Status: DC | PRN

## 2014-12-18 MED ORDER — POLYETHYLENE GLYCOL 3350 17 G PO PACK: 17.0000 g | PACK | Freq: Two times a day (BID) | ORAL | Status: DC

## 2014-12-18 MED ORDER — WARFARIN SODIUM 2 MG PO TABS: 2.0000 mg | ORAL_TABLET | Freq: Every day | ORAL | Status: DC

## 2014-12-18 NOTE — Progress Notes (Signed)
Patient is being transferred to [facility]. IV has been removed. Skin is intact and vital signs are stable.

## 2014-12-18 NOTE — Progress Notes (Signed)
PT Cancellation Note  Patient Details Name: Margaret [name] MRN: [id number] DOB: 1941/04/14   Cancelled Treatment:    Reason Eval/Treat Not Completed: Other (comment) (Patient awaiting transport to d/c to SNF).  Patient declined PT services, stating she is waiting for ambulance to transport her back to [name].  Patient will have f/u PT services at SNF. PT evaluation deferred - To d/c today.   Vena Austria 12/18/2014, 2:09 PM Durenda Hurt. Renaldo Fiddler, [facility] Acute Rehab Services Pager [phone]

## 2014-12-18 NOTE — Clinical Social Work Note (Signed)
Per MD patient ready to DC back to [facility]. RN, patient/family (Patient's husband at bedside), and facility notified of patient's DC. RN given number for report. DC packet on patient's chart. Ambulance transport requested for patient for 2:00PM. CSW signing off at this time.   Roddie Mc MSW, [place], [facility], [phone]

## 2014-12-18 NOTE — Discharge Summary (Signed)
PATIENT DETAILS Name: Margaret [name] Age: 74 y.o. Sex: female Date of Birth: Aug 31, 1940 MRN: [id number]. Admitting Physician: Inez Catalina, MD WUJ:[id number] Venetia Constable, MD  Admit Date: 12/14/2014 Discharge date: 12/18/2014  Recommendations for Outpatient Follow-up:  1. 2 weeks of oral Bactrim from 8/24  2. Patient on Coumadin-now will be on Bactrim for 2 weeks-please check INR at least 2-3 times a week and adjust dosing of Coumadin accordingly. There is interaction between Coumadin and Bactrim.  3. Please check electrolytes weekly while on Bactrim-has chronic kidney disease.   PRIMARY DISCHARGE DIAGNOSIS:  Principal Problem:   Sepsis Active Problems:   Automatic implantable cardioverter-defibrillator in situ   Atrial fibrillation   Osteoarthritis   Chronic kidney disease (CKD), stage IV (severe)   Hypertension   Hyperlipidemia   Cardiomyopathy, nonischemic   Hypothyroidism   Overactive bladder   Acute renal failure   Chronic systolic heart failure   Escherichia coli urinary tract infection   Sepsis due to urinary tract infection   Bacteremia   Nonischemic cardiomyopathy   Acute on chronic renal failure   Chronic atrial fibrillation   Essential hypertension   Other depressive episodes   Other specified hypothyroidism   Pressure ulcer      PAST MEDICAL HISTORY: Past Medical History  Diagnosis Date  . NICM (nonischemic cardiomyopathy)     EF 35%; cath 2/12 no CAD  . Systolic CHF, chronic   . Severe mitral regurgitation     s/p complex mitral valve repair with cox maze + LAA clipping, removal of RV lead, and implantation of CRT-D in abdomen  . Cardiac arrest - ventricular fibrillation     in 1990s  . Atrial fibrillation     amiodarone  . HTN (hypertension)   . HLD (hyperlipidemia)   . Achalasia     s/p dilation  . Recurrent UTI   . Obesity   . Anoxic brain injury 1996    s/p cardiac arrest   . Automatic implantable cardioverter-defibrillator in situ   .  Hypothyroidism   . TIA (transient ischemic attack)   . Chronic kidney disease (CKD), stage IV (severe)     Margaret [name] 10/03/2013    DISCHARGE MEDICATIONS: Current Discharge Medication List    START taking these medications   Details  polyethylene glycol (MIRALAX / GLYCOLAX) packet Take 17 g by mouth 2 (two) times daily.    senna-docusate (SENOKOT-S) 8.6-50 MG per tablet Take 1 tablet by mouth at bedtime as needed for mild constipation.    sulfamethoxazole-trimethoprim (BACTRIM,SEPTRA) 400-80 MG per tablet Take 1 tablet by mouth every 12 (twelve) hours. For 13 more days from 12/17/14.Please check BMET weekly while on Bactrim      CONTINUE these medications which have CHANGED   Details  warfarin (COUMADIN) 2 MG tablet Take 1 tablet (2 mg total) by mouth daily at 6 PM.      CONTINUE these medications which have NOT CHANGED   Details  !! acetaminophen (TYLENOL) 325 MG tablet Take 650 mg by mouth every 6 (six) hours as needed for moderate pain.    !! acetaminophen (TYLENOL) 500 MG tablet Take 500 mg by mouth at bedtime.    DULoxetine (CYMBALTA) 20 MG capsule Take 20 mg by mouth daily. For depression Refills: 5    fluticasone (FLONASE) 50 MCG/ACT nasal spray Place 2 sprays into both nostrils at bedtime. For allergies    folic acid (FOLVITE) 1 MG tablet Take 1 tablet by mouth daily.    furosemide (LASIX) 80  MG tablet Take 160 mg by mouth 2 (two) times daily. Take 2 tabs = 160 mg PO BID    levothyroxine (SYNTHROID, LEVOTHROID) 50 MCG tablet Take 50 mcg by mouth daily before breakfast.    loratadine (CLARITIN) 10 MG tablet Take 10 mg by mouth daily.    metoprolol tartrate (LOPRESSOR) 25 MG tablet Take 12.5 mg by mouth 2 (two) times daily.     mirabegron ER (MYRBETRIQ) 50 MG TB24 tablet Take 50 mg by mouth daily.    ondansetron (ZOFRAN) 4 MG tablet Take 4 mg by mouth every 6 (six) hours as needed for nausea or vomiting.    potassium chloride SA (K-DUR,KLOR-CON) 20 MEQ tablet Take 40  mEq by mouth 2 (two) times daily.    clobetasol ointment (TEMOVATE) 0.05 % Apply 1 application topically every morning. For rt leg, abdomen, and under breast.     !! - Potential duplicate medications found. Please discuss with provider.      ALLERGIES:  No Known Allergies  BRIEF HPI:  See H&P, Labs, Consult and Test reports for all details in brief, patient is a 74yo woman with PMH of NICM with EF of 35%, ICD in place, personal history of cardiac arrest, Afib on coumadin, HTN, HLD, hypothyroidism, CKD and h/o TIA who presented with 3 days of vomiting and confusion where she was "talking out of her head." In the ED, she was found to be febrile and hypotensive with some tachycardia.Her urine is positive for nitrite, 21-50 WBC, cloudy and moderate LE. She was subsequently admitted for further evaluation and treatment   CONSULTATIONS:   None  PERTINENT RADIOLOGIC STUDIES: Dg Chest Port 1 View  12/17/2014   CLINICAL DATA:  Shortness of breath.  EXAM: PORTABLE CHEST - 1 VIEW  COMPARISON:  December 14, 2014.  FINDINGS: Stable cardiomediastinal silhouette. Stable sternotomy wires and leads are noted. No pneumothorax or pleural effusion is noted. No acute pulmonary disease is noted. Bony thorax is intact.  IMPRESSION: No acute cardiopulmonary abnormality seen.   Electronically Signed   By: Lupita Raider, M.D.   On: 12/17/2014 07:19   Dg Chest Port 1 View  12/14/2014   CLINICAL DATA:  Nausea and vomiting for 2 days  EXAM: PORTABLE CHEST - 1 VIEW  COMPARISON:  12/17/2013  FINDINGS: Cardiac shadow is mildly enlarged but stable. Multiple leads are again identified over the cardiac shadow. The lungs are well aerated. Mild vascular congestion is noted. No focal confluent infiltrate is seen.  IMPRESSION: Mild vascular congestion   Electronically Signed   By: Alcide Clever M.D.   On: 12/14/2014 17:15     PERTINENT LAB RESULTS: CBC:  Recent Labs  12/17/14 0514 12/18/14 0615  WBC 6.1 6.1  HGB 8.4*  8.5*  HCT 27.7* 27.2*  PLT 149* 160   CMET CMP     Component Value Date/Time   NA 141 12/18/2014 0615   K 3.3* 12/18/2014 0615   CL 105 12/18/2014 0615   CO2 27 12/18/2014 0615   GLUCOSE 110* 12/18/2014 0615   BUN 16 12/18/2014 0615   CREATININE 1.59* 12/18/2014 0615   CALCIUM 8.5* 12/18/2014 0615   PROT 6.7 12/18/2014 0615   ALBUMIN 2.3* 12/18/2014 0615   AST 22 12/18/2014 0615   ALT 16 12/18/2014 0615   ALKPHOS 71 12/18/2014 0615   BILITOT 1.0 12/18/2014 0615   GFRNONAA 31* 12/18/2014 0615   GFRAA 36* 12/18/2014 0615    GFR Estimated Creatinine Clearance: 36.1 mL/min (by C-G formula  based on Cr of 1.59). No results for input(s): LIPASE, AMYLASE in the last 72 hours. No results for input(s): CKTOTAL, CKMB, CKMBINDEX, TROPONINI in the last 72 hours. Invalid input(s): POCBNP No results for input(s): DDIMER in the last 72 hours. No results for input(s): HGBA1C in the last 72 hours.  Recent Labs  12/17/14 0514  CHOL 143  HDL 22*  LDLCALC 95  TRIG 161  CHOLHDL 6.5    Recent Labs  12/17/14 0514  TSH 7.336*   No results for input(s): VITAMINB12, FOLATE, FERRITIN, TIBC, IRON, RETICCTPCT in the last 72 hours. Coags:  Recent Labs  12/16/14 0327 12/18/14 0615  INR 2.12* 2.55*   Microbiology: Recent Results (from the past 240 hour(s))  Urine culture     Status: None   Collection Time: 12/14/14  4:05 PM  Result Value Ref Range Status   Specimen Description URINE, CATHETERIZED  Final   Special Requests NONE  Final   Culture   Final    >=100,000 COLONIES/mL ESCHERICHIA COLI Confirmed Extended Spectrum Beta-Lactamase Producer (ESBL)    Report Status 12/17/2014 FINAL  Final   Organism ID, Bacteria ESCHERICHIA COLI  Final      Susceptibility   Escherichia coli - MIC*    AMPICILLIN >=32 RESISTANT Resistant     CEFAZOLIN >=64 RESISTANT Resistant     CEFTRIAXONE >=64 RESISTANT Resistant     CIPROFLOXACIN >=4 RESISTANT Resistant     GENTAMICIN >=16 RESISTANT  Resistant     IMIPENEM <=0.25 SENSITIVE Sensitive     NITROFURANTOIN 32 SENSITIVE Sensitive     TRIMETH/SULFA <=20 SENSITIVE Sensitive     AMPICILLIN/SULBACTAM >=32 RESISTANT Resistant     PIP/TAZO 64 INTERMEDIATE Intermediate     * >=100,000 COLONIES/mL ESCHERICHIA COLI  Blood Culture (routine x 2)     Status: None (Preliminary result)   Collection Time: 12/14/14  4:14 PM  Result Value Ref Range Status   Specimen Description BLOOD RIGHT ARM  Final   Special Requests BOTTLES DRAWN AEROBIC AND ANAEROBIC  Final   Culture  Setup Time   Final    GRAM NEGATIVE RODS IN BOTH AEROBIC AND ANAEROBIC BOTTLES CRITICAL RESULT CALLED TO, READ BACK BY AND VERIFIED WITH: TO MINGRAM(RN) BY TCLEVELAND 12/15/2014 AT 6:41AM    Culture   Final    ESCHERICHIA COLI Confirmed Extended Spectrum Beta-Lactamase Producer (ESBL)    Report Status PENDING  Incomplete   Organism ID, Bacteria ESCHERICHIA COLI  Final      Susceptibility   Escherichia coli - MIC*    AMPICILLIN >=32 RESISTANT Resistant     CEFAZOLIN >=64 RESISTANT Resistant     CEFEPIME >=64 RESISTANT Resistant     CEFTAZIDIME >=64 RESISTANT Resistant     CEFTRIAXONE >=64 RESISTANT Resistant     CIPROFLOXACIN >=4 RESISTANT Resistant     GENTAMICIN >=16 RESISTANT Resistant     IMIPENEM <=0.25 SENSITIVE Sensitive     TRIMETH/SULFA <=20 SENSITIVE Sensitive     AMPICILLIN/SULBACTAM >=32 RESISTANT Resistant     PIP/TAZO 64 INTERMEDIATE Intermediate     * ESCHERICHIA COLI  Blood Culture (routine x 2)     Status: None   Collection Time: 12/14/14  4:48 PM  Result Value Ref Range Status   Specimen Description BLOOD RIGHT HAND  Final   Special Requests BOTTLES DRAWN AEROBIC AND ANAEROBIC 10CC  Final   Culture  Setup Time   Final    GRAM NEGATIVE RODS IN BOTH AEROBIC AND ANAEROBIC BOTTLES CRITICAL RESULT CALLED TO,  READ BACK BY AND VERIFIED WITH: TO MINGRAM(RN) BY [facility] 12/15/2014 AT 6:43    Culture   Final    ESCHERICHIA  COLI SUSCEPTIBILITIES PERFORMED ON PREVIOUS CULTURE WITHIN THE LAST 5 DAYS.    Report Status 12/18/2014 FINAL  Final  MRSA PCR Screening     Status: Abnormal   Collection Time: 12/14/14  8:35 PM  Result Value Ref Range Status   MRSA by PCR POSITIVE (A) NEGATIVE Final    Comment:        The GeneXpert MRSA Assay (FDA approved for NASAL specimens only), is one component of a comprehensive MRSA colonization surveillance program. It is not intended to diagnose MRSA infection nor to guide or monitor treatment for MRSA infections. RESULT CALLED TO, READ BACK BY AND VERIFIED WITH: PETTIFORD,A RN 2244 12/14/14 MITCHELL,L      BRIEF HOSPITAL COURSE:  Sepsis: Secondary to Escherichia coli bacteremia and UTI. Sepsis pathophysiology has resolved. Continue Bactrim for a total of 2 weeks.  Active Problems: Escherichia coli UTI and bacteremia: Urine/blood cultures positive for Escherichia coli-resistant to multiple drugs (only sensitive to Bactrim and Imipenem)-Initially on Zosyn, changed  to Primaxin on 8/24-this MD spoke with infectious disease M.D. Dr. Domingo Pulse discussed, Dr. Orvan Falconer recommended that should be okay to use renally dosed Bactrim with close monitoring. Subsequently this M.D. spoke with patient and daughter-gave options for placing a PICC line and starting Primaxin or Bactrim with close monitoring. Patient and family would like to try Bactrim, they are aware that potential for worsening renal function. Please make sure we check electrolytes at least once a week while on Bactrim. Also, will need close monitoring of INR-given interaction of Bactrim with coumadin.  Acute on chronic kidney disease stage III: Acute renal failure likely secondary to prerenal azotemia, much improved, now close to usual baseline. please continue to check Lites closely while on Bactrim-see above   Chronic systolic CHF: Compensated, resume Lasix at usual doses, resume low-dose metoprolol on discharge.  Blood pressure stable. Not a candidate for ACE inhibitor given CKD.  Chronic atrial fibrillation: Rate controlled with metoprolol, continue Coumadin-as a potential interaction with Bactrim-we will decrease Coumadin to 2 mg. Please check INR at least 2-3 times a week while on Coumadin.  Atomatic implantable cardioverter-defibrillator in situ  Hypothyroidism: Continue Synthroid  Overactive bladder with incontinence:Continue myrbetriq. Recently had a chronic indwelling Foley catheter that was removed a few weeks ago.  Major depression, chronic: Continue cymbalta  Hypothyroidism: Continue with levothyroxine, TSH mildly elevated at 7.3, unsure when last dosing of levothyroxine was adjusted-we will defer to PCP. Suggest rechecking TSH in 3 months.  TODAY-DAY OF DISCHARGE:  Subjective:   Joshlynn Alfonzo today has no headache,no chest abdominal pain,no new weakness tingling or numbness, feels much better.   Objective:   Blood pressure 124/47, pulse 100, temperature 98.2 F (36.8 C), temperature source Oral, resp. rate 18, height 5\' 5"  (1.651 m), weight 98.385 kg (216 lb 14.4 oz), SpO2 100 %.  Intake/Output Summary (Last 24 hours) at 12/18/14 1200 Last data filed at 12/18/14 0907  Gross per 24 hour  Intake    420 ml  Output      0 ml  Net    420 ml   Filed Weights   12/16/14 0500 12/17/14 0005 12/18/14 0500  Weight: 98 kg (216 lb 0.8 oz) 103.738 kg (228 lb 11.2 oz) 98.385 kg (216 lb 14.4 oz)    Exam Awake Alert, Oriented *3, No new F.N deficits, Normal affect [place].AT,PERRAL Supple Neck,No  JVD, No cervical lymphadenopathy appriciated.  Symmetrical Chest wall movement, Good air movement bilaterally, CTAB RRR,No Gallops,Rubs or new Murmurs, No Parasternal Heave +ve B.Sounds, Abd Soft, Non tender, No organomegaly appriciated, No rebound -guarding or rigidity. No Cyanosis, Clubbing or edema, No new Rash or bruise  DISCHARGE CONDITION: Stable  DISPOSITION: SNF  DISCHARGE  INSTRUCTIONS:    Activity:  As tolerated with Full fall precautions use walker/cane & assistance as needed  Diet recommendation: Heart Healthy diet Fluid restriction 1.5lit/day  Discharge Instructions    (HEART FAILURE PATIENTS) Call MD:  Anytime you have any of the following symptoms: 1) 3 pound weight gain in 24 hours or 5 pounds in 1 week 2) shortness of breath, with or without a dry hacking cough 3) swelling in the hands, feet or stomach 4) if you have to sleep on extra pillows at night in order to breathe.    Complete by:  As directed      Diet - low sodium heart healthy    Complete by:  As directed      Increase activity slowly    Complete by:  As directed            Follow-up Information    Follow up with Allean Found, MD. Schedule an appointment as soon as possible for a visit in 1 week.   Specialty:  Family Medicine   Contact information:   [phone] [address] [place] [address] [phone]       Follow up with Georga Hacking, MD. Schedule an appointment as soon as possible for a visit in 2 weeks.   Specialty:  Cardiology   Contact information:   [address] [place] [address] [place] [address] [phone]      Total Time spent on discharge equals 45 minutes.  SignedJeoffrey Massed 12/18/2014 12:00 PM

## 2014-12-18 NOTE — Care Management Note (Signed)
Case Management Note  Patient Details  Name: TSION REALMUTO MRN: [id number] Date of Birth: 19-Dec-1940  Subjective/Objective:   Patient is from a snf, ? If will need picc line.  Plan is to go to snf at dc.  Pt eval also ordered.                 Action/Plan:   Expected Discharge Date:                  Expected Discharge Plan:  Skilled Nursing Facility  In-House Referral:  Clinical Social Work  Discharge planning Services  CM Consult  Post Acute Care Choice:    Choice offered to:     DME Arranged:    DME Agency:     HH Arranged:    HH Agency:     Status of Service:  Completed, signed off  Medicare Important Message Given:  Yes-second notification given Date Medicare IM Given:    Medicare IM give by:    Date Additional Medicare IM Given:    Additional Medicare Important Message give by:     If discussed at Long Length of Stay Meetings, dates discussed:    Additional Comments:  Leone Haven, RN 12/18/2014, 11:02 AM

## 2014-12-19 ENCOUNTER — Non-Acute Institutional Stay (SKILLED_NURSING_FACILITY): Payer: Medicare Other | Admitting: Adult Health

## 2014-12-19 ENCOUNTER — Encounter: Payer: Self-pay | Admitting: Adult Health

## 2014-12-19 DIAGNOSIS — I5022 Chronic systolic (congestive) heart failure: Secondary | ICD-10-CM | POA: Diagnosis not present

## 2014-12-19 DIAGNOSIS — F329 Major depressive disorder, single episode, unspecified: Secondary | ICD-10-CM

## 2014-12-19 DIAGNOSIS — I482 Chronic atrial fibrillation, unspecified: Secondary | ICD-10-CM

## 2014-12-19 DIAGNOSIS — N390 Urinary tract infection, site not specified: Secondary | ICD-10-CM

## 2014-12-19 DIAGNOSIS — E039 Hypothyroidism, unspecified: Secondary | ICD-10-CM | POA: Diagnosis not present

## 2014-12-19 DIAGNOSIS — N189 Chronic kidney disease, unspecified: Secondary | ICD-10-CM | POA: Diagnosis not present

## 2014-12-19 DIAGNOSIS — R5381 Other malaise: Secondary | ICD-10-CM

## 2014-12-19 DIAGNOSIS — N179 Acute kidney failure, unspecified: Secondary | ICD-10-CM | POA: Diagnosis not present

## 2014-12-19 DIAGNOSIS — A419 Sepsis, unspecified organism: Secondary | ICD-10-CM | POA: Diagnosis not present

## 2014-12-19 DIAGNOSIS — E43 Unspecified severe protein-calorie malnutrition: Secondary | ICD-10-CM

## 2014-12-19 DIAGNOSIS — F322 Major depressive disorder, single episode, severe without psychotic features: Secondary | ICD-10-CM

## 2014-12-19 DIAGNOSIS — N3281 Overactive bladder: Secondary | ICD-10-CM | POA: Diagnosis not present

## 2014-12-22 LAB — CULTURE, BLOOD (ROUTINE X 2)

## 2014-12-25 ENCOUNTER — Encounter: Payer: Self-pay | Admitting: Adult Health

## 2014-12-25 ENCOUNTER — Encounter: Payer: Self-pay | Admitting: Internal Medicine

## 2014-12-25 ENCOUNTER — Non-Acute Institutional Stay (SKILLED_NURSING_FACILITY): Payer: Medicare Other | Admitting: Internal Medicine

## 2014-12-25 DIAGNOSIS — E038 Other specified hypothyroidism: Secondary | ICD-10-CM | POA: Diagnosis not present

## 2014-12-25 DIAGNOSIS — R32 Unspecified urinary incontinence: Secondary | ICD-10-CM

## 2014-12-25 DIAGNOSIS — N179 Acute kidney failure, unspecified: Secondary | ICD-10-CM

## 2014-12-25 DIAGNOSIS — B9620 Unspecified Escherichia coli [E. coli] as the cause of diseases classified elsewhere: Secondary | ICD-10-CM | POA: Diagnosis not present

## 2014-12-25 DIAGNOSIS — N390 Urinary tract infection, site not specified: Secondary | ICD-10-CM

## 2014-12-25 DIAGNOSIS — E46 Unspecified protein-calorie malnutrition: Secondary | ICD-10-CM

## 2014-12-25 DIAGNOSIS — N189 Chronic kidney disease, unspecified: Secondary | ICD-10-CM

## 2014-12-25 DIAGNOSIS — R5381 Other malaise: Secondary | ICD-10-CM

## 2014-12-25 DIAGNOSIS — G4700 Insomnia, unspecified: Secondary | ICD-10-CM

## 2014-12-25 DIAGNOSIS — I482 Chronic atrial fibrillation, unspecified: Secondary | ICD-10-CM

## 2014-12-25 DIAGNOSIS — I5022 Chronic systolic (congestive) heart failure: Secondary | ICD-10-CM | POA: Diagnosis not present

## 2014-12-25 NOTE — Progress Notes (Signed)
Patient ID: Margaret [name], female   DOB: 1940/09/20, 74 y.o.   MRN: [id number]    DATE:  12/19/14 MRN:  [id number]  BIRTHDAY: 1940-05-01  Facility:  Nursing Home Location:  [facility]  Nursing Home Room Number: 805-2  LEVEL OF CARE:  SNF [phone])  Contact Information    Name Relation Home Work Mobile   Hawks,Vicky Daughter   [phone]   Analicia, Bartolucci) Spouse [phone]  [phone]   Breanda, [place]   [phone]      Chief Complaint  Patient presents with  . Hospitalization Follow-up    Physical deconditioning, sepsis secondary to UTI, acute on chronic kidney disease is stage III, chronic systolic CHF, chronic atrial fibrillation, hypothyroidism, overactive bladder, major depression and protein calorie malnutrition.    HISTORY OF PRESENT ILLNESS:  This is a 74 year old female who has been readmitted to [facility] on 12/18/14 from [facility]. She has PMH of NICM with EF 35%, ICD in place, personal history of cardiac arrest, atrial fibrillation on Coumadin, hypertension, hyperlipidemia and history of TIA. She was treated in the hospital for sepsis secondary to Escherichia coli bacteremia and UTI.  She has been admitted for long-term care.  PAST MEDICAL HISTORY:  Past Medical History  Diagnosis Date  . NICM (nonischemic cardiomyopathy)     EF 35%; cath 2/12 no CAD  . Systolic CHF, chronic   . Severe mitral regurgitation     s/p complex mitral valve repair with cox maze + LAA clipping, removal of RV lead, and implantation of CRT-D in abdomen  . Cardiac arrest - ventricular fibrillation     in 1990s  . Atrial fibrillation     amiodarone  . HTN (hypertension)   . HLD (hyperlipidemia)   . Achalasia     s/p dilation  . Recurrent UTI   . Obesity   . Anoxic brain injury 1996    s/p cardiac arrest   . Automatic implantable cardioverter-defibrillator in situ   . Hypothyroidism   . TIA (transient ischemic attack)   . Chronic kidney disease  (CKD), stage IV (severe)     [name] 10/03/2013     CURRENT MEDICATIONS: Reviewed  Patient's Medications  New Prescriptions   No medications on file  Previous Medications   ACETAMINOPHEN (TYLENOL) 325 MG TABLET    Take 650 mg by mouth every 6 (six) hours as needed for moderate pain.   ACETAMINOPHEN (TYLENOL) 500 MG TABLET    Take 500 mg by mouth at bedtime.   DULOXETINE (CYMBALTA) 20 MG CAPSULE    Take 20 mg by mouth daily. For depression   FLUTICASONE (FLONASE) 50 MCG/ACT NASAL SPRAY    Place 2 sprays into both nostrils at bedtime. For allergies   FOLIC ACID (FOLVITE) 1 MG TABLET    Take 1 tablet by mouth daily.   FUROSEMIDE (LASIX) 80 MG TABLET    Take 160 mg by mouth 2 (two) times daily. Take 2 tabs = 160 mg PO BID   LEVOTHYROXINE (SYNTHROID, LEVOTHROID) 50 MCG TABLET    Take 50 mcg by mouth daily before breakfast.   LORATADINE (CLARITIN) 10 MG TABLET    Take 10 mg by mouth daily.   MELATONIN 5 MG TABS    Take by mouth at bedtime.   METOPROLOL TARTRATE (LOPRESSOR) 25 MG TABLET    Take 12.5 mg by mouth 2 (two) times daily.    MIRABEGRON ER (MYRBETRIQ) 50 MG TB24 TABLET    Take 50 mg by  mouth daily.   MULTIPLE VITAMINS-MINERALS (DECUBI-VITE PO)    Take by mouth daily. For wound healing   ONDANSETRON (ZOFRAN) 4 MG TABLET    Take 4 mg by mouth every 6 (six) hours as needed for nausea or vomiting.   POLYETHYLENE GLYCOL (MIRALAX / GLYCOLAX) PACKET    Take 17 g by mouth 2 (two) times daily.   POTASSIUM CHLORIDE SA (K-DUR,KLOR-CON) 20 MEQ TABLET    Take 40 mEq by mouth 2 (two) times daily.   SENNA-DOCUSATE (SENOKOT-S) 8.6-50 MG PER TABLET    Take 1 tablet by mouth at bedtime as needed for mild constipation.   SULFAMETHOXAZOLE-TRIMETHOPRIM (BACTRIM,SEPTRA) 400-80 MG PER TABLET    Take 1 tablet by mouth every 12 (twelve) hours. For 13 more days from 12/17/14.Please check BMET weekly while on Bactrim   WARFARIN (COUMADIN) 2 MG TABLET    Take 1 tablet (2 mg total) by mouth daily at 6 PM.  Modified  Medications   No medications on file  Discontinued Medications   CLOBETASOL OINTMENT (TEMOVATE) 0.05 %    Apply 1 application topically every morning. For rt leg, abdomen, and under breast.     No Known Allergies   REVIEW OF SYSTEMS:  GENERAL: no change in appetite, no fatigue, no weight change EYES: Denies change in vision, dry eyes, eye pain, itching or discharge EARS: Denies change in hearing, ringing in ears, or earache NOSE: Denies nasal congestion or epistaxis MOUTH and THROAT: Denies oral discomfort, gingival pain or bleeding, pain from teeth or hoarseness   RESPIRATORY: no cough, SOB, DOE, wheezing, hemoptysis CARDIAC: no chest pain, edema or palpitations PSYCHIATRIC: Denies feeling of depression or anxiety. No report of hallucinations, insomnia, paranoia, or agitation   PHYSICAL EXAMINATION  GENERAL APPEARANCE: Well nourished. In no acute distress. Obese HEAD: Normal in size and contour. No evidence of trauma EYES: Lids open and close normally. No blepharitis, entropion or ectropion. PERRL. Conjunctivae are clear and sclerae are white. Lenses are without opacity EARS: Pinnae are normal. Patient hears normal voice tunes of the examiner MOUTH and THROAT: Lips are without lesions. Oral mucosa is moist and without lesions. Tongue is normal in shape, size, and color and without lesions NECK: supple, trachea midline, no neck masses, no thyroid tenderness, no thyromegaly LYMPHATICS: no LAN in the neck, no supraclavicular LAN RESPIRATORY: breathing is even & unlabored, BS CTAB CARDIAC: RRR, no murmur,no extra heart sounds, +ICD GI: abdomen soft, normal BS, no masses, no tenderness, no hepatomegaly, no splenomegaly EXTREMITIES:  Able to move 4 extremities; generalized weakness of BLE PSYCHIATRIC: Alert and oriented X 3. Affect and behavior are appropriate  LABS/RADIOLOGY: Labs reviewed: Basic Metabolic Panel:  Recent Labs  91/47/82 0327 12/16/14 1542 12/17/14 0514  12/18/14 0615  NA 140  --  139 141  K 3.0* 3.3* 3.7 3.3*  CL 107  --  106 105  CO2 22  --  23 27  GLUCOSE 119*  --  100* 110*  BUN 29*  --  20 16  CREATININE 2.04*  --  1.71* 1.59*  CALCIUM 7.8*  --  8.3* 8.5*  MG 2.1 2.2 2.1  --    Liver Function Tests:  Recent Labs  12/16/14 0327 12/17/14 0514 12/18/14 0615  AST 20 22 22   ALT 16 16 16   ALKPHOS 71 74 71  BILITOT 1.1 0.8 1.0  PROT 6.0* 6.5 6.7  ALBUMIN 2.3* 2.3* 2.3*    Recent Labs  12/17/14 0524  AMMONIA 23   CBC:  Recent Labs  12/14/14 1614  12/16/14 0327 12/17/14 0514 12/18/14 0615  WBC 7.9  < > 7.3 6.1 6.1  NEUTROABS 7.2  --   --  3.9 3.9  HGB 9.8*  < > 8.2* 8.4* 8.5*  HCT 31.6*  < > 26.7* 27.7* 27.2*  MCV 91.9  < > 92.7 92.6 93.2  PLT 155  < > 121* 149* 160  < > = values in this interval not displayed.  Lipid Panel:  Recent Labs  12/17/14 0514  HDL 22*   Cardiac Enzymes:  Recent Labs  12/14/14 2126 12/15/14 0239 12/15/14 0814  TROPONINI 0.06* 0.04* 0.06*    Dg Chest Port 1 View  12/17/2014   CLINICAL DATA:  Shortness of breath.  EXAM: PORTABLE CHEST - 1 VIEW  COMPARISON:  December 14, 2014.  FINDINGS: Stable cardiomediastinal silhouette. Stable sternotomy wires and leads are noted. No pneumothorax or pleural effusion is noted. No acute pulmonary disease is noted. Bony thorax is intact.  IMPRESSION: No acute cardiopulmonary abnormality seen.   Electronically Signed   By: Lupita Raider, M.D.   On: 12/17/2014 07:19   Dg Chest Port 1 View  12/14/2014   CLINICAL DATA:  Nausea and vomiting for 2 days  EXAM: PORTABLE CHEST - 1 VIEW  COMPARISON:  12/17/2013  FINDINGS: Cardiac shadow is mildly enlarged but stable. Multiple leads are again identified over the cardiac shadow. The lungs are well aerated. Mild vascular congestion is noted. No focal confluent infiltrate is seen.  IMPRESSION: Mild vascular congestion   Electronically Signed   By: Alcide Clever M.D.   On: 12/14/2014 17:15     ASSESSMENT/PLAN:  Physical deconditioning - for rehabilitation  Sepsis secondary to UTI - continue Bactrim 400-80 mg 1 tab by mouth every 12 hours 13 days; check CBC  Acute on chronic kidney disease stage III - creatinine 1.59; check BMP  Chronic systolic CHF - continue metoprolol 25 mg 1/2 tab = 12.5 mg by mouth twice a day and Lasix 80 mg 2 tabs = 160 mg by mouth twice a day; not a candidate for ace due to CKD; follow-up with Dr.Tilley, cardiologist, in 2 weeks  Chronic atrial fibrillation - rate controlled; continue metoprolol 25 mg 1/2 tab = 12.5 mg by mouth twice a day and Coumadin 2 mg daily; INR check 2 times/week while on Bactrim  Hypothyroidism - TSH 7.336; continue Synthroid 50 g daily; repeat TSH in 1 month  Overactive bladder - continue Myrbetriq 50 mg 1 by mouth daily  Major depression - mood is stable; continue Cymbalta 20 mg 1 capsule by mouth daily  Protein calorie malnutrition, severe - albumin 2.3; RD consult    Goals of care:  Long-term care     [facility], NP [facility] [phone]

## 2014-12-25 NOTE — Progress Notes (Signed)
Patient ID: Margaret [name], female   DOB: 03-21-1941, 74 y.o.   MRN: [id number]     [facility]  PCP: Allean Found, MD  Code Status: Full Code   No Known Allergies  Chief Complaint  Patient presents with  . New Admit To SNF    New Admission      HPI:  74 y.o. patient is here for long term care post hospital admission with e.coli uti and sepsis. She had acute on chronic kidney disease. She responded well to antibiotics. She is seen in her room today. She has PMH of afib, HTN, TIA, NICM with EF 35% and has ICD in place. She is seen in her room today. She complaints of insomnia. She denies any other concerns this visit. As per staff has been refusing therapy at times.   Review of Systems:  Constitutional: Negative for fever, chills, diaphoresis.  HENT: Negative for headache, congestion, nasal discharge Eyes: Negative for eye pain, blurred vision, double vision and discharge.  Respiratory: Negative for cough, shortness of breath and wheezing.   Cardiovascular: Negative for chest pain, palpitations, leg swelling.  Gastrointestinal: Negative for heartburn, nausea, vomiting, abdominal pain. Has regular bowel movement Genitourinary: Negative for dysuria  Musculoskeletal: Negative for back pain, falls Skin: Negative for rash.  Neurological: Negative for dizziness, tingling, focal weakness Psychiatric/Behavioral: Negative for depression    Past Medical History  Diagnosis Date  . NICM (nonischemic cardiomyopathy)     EF 35%; cath 2/12 no CAD  . Systolic CHF, chronic   . Severe mitral regurgitation     s/p complex mitral valve repair with cox maze + LAA clipping, removal of RV lead, and implantation of CRT-D in abdomen  . Cardiac arrest - ventricular fibrillation     in 1990s  . Atrial fibrillation     amiodarone  . HTN (hypertension)   . HLD (hyperlipidemia)   . Achalasia     s/p dilation  . Recurrent UTI   . Obesity   . Anoxic brain injury 1996    s/p  cardiac arrest   . Automatic implantable cardioverter-defibrillator in situ   . Hypothyroidism   . TIA (transient ischemic attack)   . Chronic kidney disease (CKD), stage IV (severe)     [name] 10/03/2013   Past Surgical History  Procedure Laterality Date  . Defibrillator generator explantation and reimplantation; and  08/02/2001  . Device migration with anticipated pocket revision  10/29/2003  . 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 Attempted implantation of an implantable cardioverter-  12/30/2004  . Cardioversion  04/14/2010  . Median sternotomy  07/16/2010  . Mitral valve repair  07/16/2010  . Cox maze procedure (complete biatrial lesion set with clipping of  07/16/2010  . Removal of old endocardial right ventricular defibrillator lead.  07/16/2010  . Placement of dual chamber pacemaker and implantable cardiac  07/16/2010  . Placement of swan ganz pulmonary artery catheter via left femoral access.  07/16/2010  . Laparoscopic cholecystectomy    . Cataract extraction, bilateral    . Cystoscopy w/ stone manipulation    . Cataract extraction, bilateral Bilateral    Social History:   reports that she has never smoked. She has never used smokeless tobacco. She reports that she does not drink alcohol or use illicit drugs.  Family History  Problem Relation Age of Onset  . Stroke Mother   . Lung cancer Father   . Diabetes Mellitus II  Brother     Medications:   Medication List       This list is accurate as of: 12/25/14 10:27 AM.  Always use your most recent med list.               acetaminophen 500 MG tablet  Commonly known as:  TYLENOL  Take 500 mg by mouth at bedtime.     acetaminophen 325 MG tablet  Commonly known as:  TYLENOL  Take 650 mg by mouth every 6 (six) hours as needed for moderate pain.     DECUBI-VITE PO  Take by mouth daily. For wound healing     DULoxetine 20 MG capsule  Commonly known as:  CYMBALTA  Take 20 mg  by mouth daily. For depression     fluticasone 50 MCG/ACT nasal spray  Commonly known as:  FLONASE  Place 2 sprays into both nostrils at bedtime. For allergies     folic acid 1 MG tablet  Commonly known as:  FOLVITE  Take 1 tablet by mouth daily.     furosemide 80 MG tablet  Commonly known as:  LASIX  Take 160 mg by mouth 2 (two) times daily. Take 2 tabs = 160 mg PO BID     levothyroxine 50 MCG tablet  Commonly known as:  SYNTHROID, LEVOTHROID  Take 50 mcg by mouth daily before breakfast.     loratadine 10 MG tablet  Commonly known as:  CLARITIN  Take 10 mg by mouth daily.     Melatonin 5 MG Tabs  Take by mouth at bedtime.     metoprolol tartrate 25 MG tablet  Commonly known as:  LOPRESSOR  Take 12.5 mg by mouth 2 (two) times daily.     MYRBETRIQ 50 MG Tb24 tablet  Generic drug:  mirabegron ER  Take 50 mg by mouth daily.     ondansetron 4 MG tablet  Commonly known as:  ZOFRAN  Take 4 mg by mouth every 6 (six) hours as needed for nausea or vomiting.     polyethylene glycol packet  Commonly known as:  MIRALAX / GLYCOLAX  Take 17 g by mouth 2 (two) times daily.     potassium chloride SA 20 MEQ tablet  Commonly known as:  K-DUR,KLOR-CON  Take 40 mEq by mouth 2 (two) times daily.     senna-docusate 8.6-50 MG per tablet  Commonly known as:  Senokot-S  Take 1 tablet by mouth at bedtime as needed for mild constipation.     sulfamethoxazole-trimethoprim 400-80 MG per tablet  Commonly known as:  BACTRIM,SEPTRA  Take 1 tablet by mouth every 12 (twelve) hours. For 13 more days from 12/17/14.Please check BMET weekly while on Bactrim     warfarin 2 MG tablet  Commonly known as:  COUMADIN  Take 1 tablet (2 mg total) by mouth daily at 6 PM.         Physical Exam: BP 108/64 mmHg  Pulse 72  Temp(Src) 98 F (36.7 C) (Oral)  Resp 20  Ht  (1.651 m)  Wt 211 lb (95.709 kg)  BMI 35.11 kg/m2  SpO2 98%  General- elderly female, in no acute distress Head-  normocephalic, atraumatic Nose- normal nasal mucosa, no maxillary or frontal sinus tenderness, no nasal discharge Throat- moist mucus membrane, normal oropharynx, dentition is  Eyes- PERRLA, EOMI, no pallor, no icterus Neck- no cervical lymphadenopathy Cardiovascular- normal s1,s2, no murmurs, trace leg edema Respiratory- bilateral clear to auscultation, no wheeze, no rhonchi, no crackles,  no use of accessory muscles Abdomen- bowel sounds present, soft, non tender Musculoskeletal- able to move all 4 extremities, generalized weakness  Neurological- no focal deficit, alert and oriented to person, place and time Skin- warm and dry   Labs reviewed: Basic Metabolic Panel:  Recent Labs  16/10/96 0327 12/16/14 1542 12/17/14 0514 12/18/14 0615  NA 140  --  139 141  K 3.0* 3.3* 3.7 3.3*  CL 107  --  106 105  CO2 22  --  23 27  GLUCOSE 119*  --  100* 110*  BUN 29*  --  20 16  CREATININE 2.04*  --  1.71* 1.59*  CALCIUM 7.8*  --  8.3* 8.5*  MG 2.1 2.2 2.1  --    Liver Function Tests:  Recent Labs  12/16/14 0327 12/17/14 0514 12/18/14 0615  AST 20 22 22   ALT 16 16 16   ALKPHOS 71 74 71  BILITOT 1.1 0.8 1.0  PROT 6.0* 6.5 6.7  ALBUMIN 2.3* 2.3* 2.3*   No results for input(s): LIPASE, AMYLASE in the last 8760 hours.  Recent Labs  12/17/14 0524  AMMONIA 23   CBC:  Recent Labs  12/14/14 1614  12/16/14 0327 12/17/14 0514 12/18/14 0615  WBC 7.9  < > 7.3 6.1 6.1  NEUTROABS 7.2  --   --  3.9 3.9  HGB 9.8*  < > 8.2* 8.4* 8.5*  HCT 31.6*  < > 26.7* 27.7* 27.2*  MCV 91.9  < > 92.7 92.6 93.2  PLT 155  < > 121* 149* 160  < > = values in this interval not displayed. Cardiac Enzymes:  Recent Labs  12/14/14 2126 12/15/14 0239 12/15/14 0814  TROPONINI 0.06* 0.04* 0.06*   BNP: Invalid input(s): POCBNP CBG: No results for input(s): GLUCAP in the last 8760 hours.  Radiological Exams: Dg Chest Port 1 View  12/14/2014   CLINICAL DATA:  Nausea and vomiting for 2 days   EXAM: PORTABLE CHEST - 1 VIEW  COMPARISON:  12/17/2013  FINDINGS: Cardiac shadow is mildly enlarged but stable. Multiple leads are again identified over the cardiac shadow. The lungs are well aerated. Mild vascular congestion is noted. No focal confluent infiltrate is seen.  IMPRESSION: Mild vascular congestion   Electronically Signed   By: Alcide Clever M.D.   On: 12/14/2014 17:15    Assessment/Plan  Physical deconditioning Will have her work with physical therapy and occupational therapy team to help with gait training and muscle strengthening exercises.fall precautions. Skin care. Encourage to be out of bed.   E.coli uti Encourage hydration. Continue bactrim 400-80 q12h for 2 weeks from 12/17/14 and monitor symptoms  Acute on chronic renal failure In setting of sepsis. Monitor bmp  Insomnia Start melatonin 5 mg qhs  Chronic systolic CHF continue metoprolol 12.5 mg bid and lasix 160 mg bid. Has f/u with cardiology, monitor weight  Chronic atrial fibrillation continue metoprolol 12.5 mg bid for rate control. continue Coumadin 2 mg daily. INR 2.3 on 8/30. Next inr check on 12/26/14  Protein calorie malnutrition Monitor po intake and weight. Continue procel supplement  Hypothyroidism  continue Synthroid 50 mcg daily  Overactive bladder continue Myrbetriq 50 mg daily   Goals of care: long term care   Labs/tests ordered: inr, cbc, bmp  Family/ staff Communication: reviewed care plan with patient and nursing supervisor    [name], MD  [facility] [phone] (Monday-Friday 8 am - 5 pm) [phone] (afterhours)

## 2014-12-30 ENCOUNTER — Encounter: Payer: Self-pay | Admitting: Adult Health

## 2014-12-30 ENCOUNTER — Non-Acute Institutional Stay (SKILLED_NURSING_FACILITY): Payer: Medicare Other | Admitting: Adult Health

## 2014-12-30 DIAGNOSIS — I482 Chronic atrial fibrillation, unspecified: Secondary | ICD-10-CM

## 2014-12-30 DIAGNOSIS — Z7901 Long term (current) use of anticoagulants: Secondary | ICD-10-CM | POA: Diagnosis not present

## 2015-01-16 LAB — TSH: TSH: 3.49 u[IU]/mL (ref <=5.90)

## 2015-02-23 NOTE — Progress Notes (Signed)
Patient ID: Margaret [name], female   DOB: 03/28/1941, 74 y.o.   MRN: [id number]   12/12/14  Facility:  Nursing Home Location:  [facility] Room Number: [phone] LEVEL OF CARE:  SNF (31)   Chief Complaint  Patient presents with  . Medical Management of Chronic Issues    Hypertension, chronic kidney disease stage IV, atrial fibrillation, hypothyroidism, chronic systolic CHF, allergic rhinitis, hypokalemia and overactive bladder     HISTORY OF PRESENT ILLNESS:  This is a 74 year old female who is a long-term resident of [facility]. She has been admitted to [facility] on 11/05/14 from home. Patient and family wanting higher level of care. She has past medical history of MVR (S/P repair), systolic CHF (EF 95-28%), atrial fibrillation on Coumadin, AICD, history of TIA and CK D stage IV.  She was recently started on Melatonin for Insomnia and has been noted to have bilatera heel pressure ulcer, unstageable.  PAST MEDICAL HISTORY:  Past Medical History  Diagnosis Date  . NICM (nonischemic cardiomyopathy)     EF 35%; cath 2/12 no CAD  . Systolic CHF, chronic   . Severe mitral regurgitation     s/p complex mitral valve repair with cox maze + LAA clipping, removal of RV lead, and implantation of CRT-D in abdomen  . Cardiac arrest - ventricular fibrillation     in 1990s  . Atrial fibrillation     amiodarone  . HTN (hypertension)   . HLD (hyperlipidemia)   . Achalasia     s/p dilation  . Recurrent UTI   . Obesity   . Anoxic brain injury 1996    s/p cardiac arrest   . Automatic implantable cardioverter-defibrillator in situ   . Hypothyroidism   . TIA (transient ischemic attack)   . Chronic kidney disease (CKD), stage IV (severe)     [name] 10/03/2013    CURRENT MEDICATIONS: Reviewed per MAR/see medication list    Medication List       This list is accurate as of: 12/12/14 11:59 PM.  Always use your most recent med list.               clobetasol  ointment 0.05 %  Commonly known as:  TEMOVATE  Apply 1 application topically every morning. For rt leg, abdomen, and under breast.     desloratadine 5 MG tablet  Commonly known as:  CLARINEX  Take 5 mg by mouth every morning. For allergies     DULoxetine 20 MG capsule  Commonly known as:  CYMBALTA  Take 20 mg by mouth daily. For depression     fluticasone 50 MCG/ACT nasal spray  Commonly known as:  FLONASE  Place 2 sprays into both nostrils at bedtime. For allergies     folic acid 1 MG tablet  Commonly known as:  FOLVITE  Take 1 tablet by mouth daily.     furosemide 80 MG tablet  Commonly known as:  LASIX  Take 160 mg by mouth 2 (two) times daily. Take 2 tabs = 160 mg PO BID     levothyroxine 50 MCG tablet  Commonly known as:  SYNTHROID, LEVOTHROID  Take 50 mcg by mouth daily before breakfast.     metoprolol tartrate 25 MG tablet  Commonly known as:  LOPRESSOR  Take 12.5 mg by mouth 2 (two) times daily.     NIGHT-TIME SLEEP AID PO  Take 1 tablet by mouth at bedtime.     ondansetron 4 MG  tablet  Commonly known as:  ZOFRAN  Take 4 mg by mouth every 6 (six) hours as needed for nausea or vomiting.     potassium chloride 10 MEQ tablet  Commonly known as:  K-DUR,KLOR-CON  Take 10 mEq by mouth 2 (two) times daily. Take 4 tabs = 40 MEQ  PO BID     VESICARE 10 MG tablet  Generic drug:  solifenacin  Take 1 tablet by mouth daily. For bladder     warfarin 2.5 MG tablet  Commonly known as:  COUMADIN  Take 2.5 mg by mouth daily.         No Known Allergies   REVIEW OF SYSTEMS:  GENERAL: no change in appetite, no fatigue, no weight changes, no fever, chills or weakness RESPIRATORY: no cough, SOB, DOE, wheezing, hemoptysis CARDIAC: no chest pain, or palpitations GI: no abdominal pain, diarrhea, constipation, heart burn, nausea or vomiting  PHYSICAL EXAMINATION  GENERAL: no acute distress, obese EYES: conjunctivae normal, sclerae normal, normal eye lids NECK:  supple, trachea midline, no neck masses, no thyroid tenderness, no thyromegaly LYMPHATICS: no LAN in the neck, no supraclavicular LAN RESPIRATORY: breathing is even & unlabored, BS CTAB CARDIAC: Irregularly irregular, no murmur,no extra heart sounds, BLE edema 1+ GI: abdomen soft, normal BS, no masses, no tenderness, no hepatomegaly, no splenomegaly EXTREMITIES: Able to move 4 extremities; generalized weakness on BLE PSYCHIATRIC: the patient is alert & oriented to person, affect & behavior appropriate  LABS/RADIOLOGY: Labs reviewed: 11/10/14  vitamin D 147 vitamin D3 47 vitamin D2 <8 vitamin D (25-hydroxy) 17 cholesterol 193 triglyceride 108 HDL 45 LDL 126 hemoglobin Z6X 5.4 11/07/14  WBC 3.51 hemoglobin 10.1 hematocrit 31.8 MCV 90.6 platelet 239 sodium 143 potassium 3.9 glucose 96 BUN 34 creatinine 1.89 total bilirubin 0.7 alkaline phosphatase 75 SGOT 15 SGPT 10 total protein 6.3 albumin 3.1 calcium 8.5 TSH 3.946 Basic Metabolic Panel:  Recent Labs  09/60/45 0327 12/16/14 1542 12/17/14 0514 12/18/14 0615  NA 140  --  139 141  K 3.0* 3.3* 3.7 3.3*  CL 107  --  106 105  CO2 22  --  23 27  GLUCOSE 119*  --  100* 110*  BUN 29*  --  20 16  CREATININE 2.04*  --  1.71* 1.59*  CALCIUM 7.8*  --  8.3* 8.5*  MG 2.1 2.2 2.1  --    Liver Function Tests:  Recent Labs  12/16/14 0327 12/17/14 0514 12/18/14 0615  AST ALT ALKPHOS 71 74 71  BILITOT 1.1 0.8 1.0  PROT 6.0* 6.5 6.7  ALBUMIN 2.3* 2.3* 2.3*   CBC:  Recent Labs  12/14/14 1614  12/16/14 0327 12/17/14 0514 12/18/14 0615  WBC 7.9  < > 7.3 6.1 6.1  NEUTROABS 7.2  --   --  3.9 3.9  HGB 9.8*  < > 8.2* 8.4* 8.5*  HCT 31.6*  < > 26.7* 27.7* 27.2*  MCV 91.9  < > 92.7 92.6 93.2  PLT 155  < > 121* 149* 160  < > = values in this interval not displayed.  Cardiac Enzymes:  Recent Labs  12/14/14 2126 12/15/14 0239 12/15/14 0814  TROPONINI 0.06* 0.04* 0.06*     ASSESSMENT/PLAN:  Hypertension -  continue metoprolol 25 mg 1/2 tab = 12.5 mg by mouth twice a day  Chronic kidney disease stage IV  - creatinine 1.89: continue Lasix 160 mg by mouth twice a day  Chronic atrial fibrillation - rate controlled;  continue Coumadin and metoprolol 12.5 mg by mouth twice a day  Hypothyroidism - continue Synthroid 50 g 1 tab by mouth daily; check TSH  Chronic systolic CHF - continue Lasix 80 mg 2 tabs = 160 mg by mouth twice a day; weigh every Mondays and  Wednesdays  Allergic rhinitis - continue best loratadine 5 mg 1 tab by mouth daily and Flonase 50 g/ACT 1 spray to each nostril daily at bedtime  Hypokalemia - continue Klor-Con 10 MEQ take 4 tabs = 40 meq by mouth twice a day; K3.9  Overactive bladder -  continue Myrbetriq 50 mg 1 tab by mouth daily  Insomnia - continue melatonin 5 mg 1 tab by mouth daily at bedtime  Depression - mood this is stable; continue Cymbalta 20 mg 1 by mouth daily  Bilateral heel pressure ulcer - unstageable; continue treatment and decubiti by one tab by mouth daily     Goals of care:  Short-term rehabilitation     [facility] [place], NP [facility] [phone]

## 2015-02-23 NOTE — Progress Notes (Signed)
Patient ID: Margaret [name], female   DOB: 11/13/1940, 74 y.o.   MRN: [id number] Subjective:     Indication: atrial fibrillation Bleeding signs/symptoms: None Thromboembolic signs/symptoms: None  Missed Coumadin doses: None Medication changes: no Dietary changes: no Bacterial/viral infection: yes - Bactrim Other concerns: no   Review of Systems A comprehensive review of systems was negative.   Objective:    INR Today: 1.8 Current dose: Coumadin 2 mg daily  Assessment:    Subtherapeutic INR for goal of 2-3   Plan:    1. New dose: Increase Coumadin to 2.5 mg by mouth daily   2. Next INR:  01/02/15

## 2015-03-17 ENCOUNTER — Non-Acute Institutional Stay (SKILLED_NURSING_FACILITY): Payer: Medicare Other | Admitting: Adult Health

## 2015-03-17 ENCOUNTER — Encounter: Payer: Self-pay | Admitting: Adult Health

## 2015-03-17 DIAGNOSIS — Z7901 Long term (current) use of anticoagulants: Secondary | ICD-10-CM | POA: Diagnosis not present

## 2015-03-17 DIAGNOSIS — I482 Chronic atrial fibrillation, unspecified: Secondary | ICD-10-CM

## 2015-03-17 NOTE — Progress Notes (Signed)
Patient ID: Margaret [name], female   DOB: 22-May-1940, 74 y.o.   MRN: [id number] Subjective:     Indication: atrial fibrillation Bleeding signs/symptoms: None Thromboembolic signs/symptoms: None  Missed Coumadin doses: None Medication changes: no Dietary changes: no Bacterial/viral infection: no Other concerns: no  The following portions of the patient's history were reviewed and updated as appropriate: allergies, current medications, past family history, past medical history, past social history, past surgical history and problem list.  Review of Systems Pertinent items noted in HPI and remainder of comprehensive ROS otherwise negative.   Objective:    INR Today: 3.1 Current dose:  4 mg  Assessment:    Supratherapeutic INR for goal of 2-3   Plan:    1. New dose: decrease Coumadin to 3.5 mg PO daily   2. Next INR:  03/20/15

## 2015-03-20 LAB — POCT INR: INR: 3.4 — AB (ref <=1.1)

## 2015-03-27 ENCOUNTER — Non-Acute Institutional Stay (SKILLED_NURSING_FACILITY): Payer: Medicare Other | Admitting: Adult Health

## 2015-03-27 DIAGNOSIS — E46 Unspecified protein-calorie malnutrition: Secondary | ICD-10-CM

## 2015-03-27 DIAGNOSIS — E876 Hypokalemia: Secondary | ICD-10-CM | POA: Diagnosis not present

## 2015-03-27 DIAGNOSIS — I5022 Chronic systolic (congestive) heart failure: Secondary | ICD-10-CM

## 2015-03-27 DIAGNOSIS — I482 Chronic atrial fibrillation, unspecified: Secondary | ICD-10-CM

## 2015-03-27 DIAGNOSIS — G4700 Insomnia, unspecified: Secondary | ICD-10-CM | POA: Diagnosis not present

## 2015-03-27 DIAGNOSIS — F329 Major depressive disorder, single episode, unspecified: Secondary | ICD-10-CM

## 2015-03-27 DIAGNOSIS — J309 Allergic rhinitis, unspecified: Secondary | ICD-10-CM

## 2015-03-27 DIAGNOSIS — N3281 Overactive bladder: Secondary | ICD-10-CM

## 2015-03-27 DIAGNOSIS — I10 Essential (primary) hypertension: Secondary | ICD-10-CM

## 2015-03-27 DIAGNOSIS — E039 Hypothyroidism, unspecified: Secondary | ICD-10-CM

## 2015-04-01 ENCOUNTER — Encounter: Payer: Self-pay | Admitting: Adult Health

## 2015-04-01 NOTE — Progress Notes (Signed)
Patient ID: Margaret [name], female   DOB: 01/15/1941, 74 y.o.   MRN: [id number]    DATE:  03/27/15  MRN:  [id number]  BIRTHDAY: 1940-10-31  Facility:  Nursing Home Location:  [facility]  Nursing Home Room Number: [id number]  LEVEL OF CARE:  SNF [phone])  Contact Information    Name Relation Home Work Mobile   Hawks,Vicky Daughter   [phone]   Jakyria, Bleau) Spouse [phone]  [phone]   Judeen, Geralds   [phone]      Chief Complaint  Patient presents with  . Medical Management of Chronic Issues    Hypertension, chronic atrial fibrillation, hypothyroidism, CHF, allergic rhinitis, hypokalemia, overactive bladder, insomnia, protein calorie malnutrition and depression    HISTORY OF PRESENT ILLNESS:  This is a 74 year old female who is being seen on a routine visit. She is a long-term care resident of [facility]. She has PMH of NICM with EF 35%, ICD in place, personal history of cardiac arrest, atrial fibrillation on Coumadin, hypertension, hyperlipidemia and history of TIA.   PAST MEDICAL HISTORY:  Past Medical History  Diagnosis Date  . NICM (nonischemic cardiomyopathy) (HCC)     EF 35%; cath 2/12 no CAD  . Systolic CHF, chronic (HCC)   . Severe mitral regurgitation     s/p complex mitral valve repair with cox maze + LAA clipping, removal of RV lead, and implantation of CRT-D in abdomen  . Cardiac arrest - ventricular fibrillation     in 1990s  . Atrial fibrillation (HCC)     amiodarone  . HTN (hypertension)   . HLD (hyperlipidemia)   . Achalasia     s/p dilation  . Recurrent UTI   . Obesity   . Anoxic brain injury (HCC) 1996    s/p cardiac arrest   . Automatic implantable cardioverter-defibrillator in situ   . Hypothyroidism   . TIA (transient ischemic attack)   . Chronic kidney disease (CKD), stage IV (severe) (HCC)     [name] 10/03/2013     CURRENT MEDICATIONS: Reviewed  Patient's Medications  New Prescriptions   No medications  on file  Previous Medications   ACETAMINOPHEN (TYLENOL) 500 MG TABLET    Take 500 mg by mouth at bedtime.   DULOXETINE (CYMBALTA) 20 MG CAPSULE    Take 20 mg by mouth daily. For depression   FLUTICASONE (FLONASE) 50 MCG/ACT NASAL SPRAY    Place 2 sprays into both nostrils at bedtime. For allergies   FOLIC ACID (FOLVITE) 1 MG TABLET    Take 1 tablet by mouth daily.   FUROSEMIDE (LASIX) 80 MG TABLET    Take 160 mg by mouth 2 (two) times daily. Take 2 tabs = 160 mg PO BID   LEVOTHYROXINE (SYNTHROID, LEVOTHROID) 50 MCG TABLET    Take 50 mcg by mouth daily before breakfast.   LORATADINE (CLARITIN) 10 MG TABLET    Take 10 mg by mouth daily.   MELATONIN 5 MG TABS    Take by mouth at bedtime.   METOPROLOL TARTRATE (LOPRESSOR) 25 MG TABLET    Take 12.5 mg by mouth 2 (two) times daily.    MIRABEGRON ER (MYRBETRIQ) 50 MG TB24 TABLET    Take 50 mg by mouth daily.   MULTIPLE VITAMINS-MINERALS (DECUBI-VITE PO)    Take by mouth daily. For wound healing   ONDANSETRON (ZOFRAN) 4 MG TABLET    Take 4 mg by mouth every 6 (six) hours as needed for nausea or vomiting.  POLYETHYLENE GLYCOL 3350 (MIRALAX PO)    Take 17 g by mouth 2 (two) times daily as needed.   POLYETHYLENE GLYCOL POWDER (MIRALAX) POWDER    Take 1 Container by mouth once.   POTASSIUM CHLORIDE SA (K-DUR,KLOR-CON) 20 MEQ TABLET    Take 40 mEq by mouth 2 (two) times daily.   PROTEIN (PROCEL) POWD    Take 1 scoop by mouth 2 (two) times daily.   SENNA-DOCUSATE (SENOKOT-S) 8.6-50 MG PER TABLET    Take 1 tablet by mouth at bedtime as needed for mild constipation.   WARFARIN (COUMADIN) 3 MG TABLET    Take 3 mg by mouth daily.  Modified Medications   No medications on file  Discontinued Medications   POLYETHYLENE GLYCOL (MIRALAX / GLYCOLAX) PACKET    Take 17 g by mouth 2 (two) times daily.     No Known Allergies   REVIEW OF SYSTEMS:  GENERAL: no change in appetite, no fatigue, no weight change EYES: Denies change in vision, dry eyes, eye pain,  itching or discharge EARS: Denies change in hearing, ringing in ears, or earache NOSE: Denies nasal congestion or epistaxis MOUTH and THROAT: Denies oral discomfort, gingival pain or bleeding, pain from teeth or hoarseness   RESPIRATORY: no cough, SOB, DOE, wheezing, hemoptysis CARDIAC: no chest pain, edema or palpitations PSYCHIATRIC: Denies feeling of depression or anxiety. No report of hallucinations, insomnia, paranoia, or agitation   PHYSICAL EXAMINATION  GENERAL APPEARANCE: Well nourished. In no acute distress. Obese HEAD: Normal in size and contour. No evidence of trauma EYES: Lids open and close normally. No blepharitis, entropion or ectropion. PERRL. Conjunctivae are clear and sclerae are white. Lenses are without opacity EARS: Pinnae are normal. Patient hears normal voice tunes of the examiner MOUTH and THROAT: Lips are without lesions. Oral mucosa is moist and without lesions. Tongue is normal in shape, size, and color and without lesions NECK: supple, trachea midline, no neck masses, no thyroid tenderness, no thyromegaly LYMPHATICS: no LAN in the neck, no supraclavicular LAN RESPIRATORY: breathing is even & unlabored, BS CTAB CARDIAC: RRR, no murmur,no extra heart sounds, +ICD GI: abdomen soft, normal BS, no masses, no tenderness, no hepatomegaly, no splenomegaly GU:  Has foley catheter EXTREMITIES:  Able to move 4 extremities; generalized weakness of BLE PSYCHIATRIC: Alert and oriented X 3. Affect and behavior are appropriate  LABS/RADIOLOGY: Labs reviewed: 01/16/15  TSH 3.493 Basic Metabolic Panel:  Recent Labs  47/82/95 0327 12/16/14 1542 12/17/14 0514 12/18/14 0615  NA 140  --  139 141  K 3.0* 3.3* 3.7 3.3*  CL 107  --  106 105  CO2 22  --  23 27  GLUCOSE 119*  --  100* 110*  BUN 29*  --  20 16  CREATININE 2.04*  --  1.71* 1.59*  CALCIUM 7.8*  --  8.3* 8.5*  MG 2.1 2.2 2.1  --    Liver Function Tests:  Recent Labs  12/16/14 0327 12/17/14 0514  12/18/14 0615  AST ALT ALKPHOS 71 74 71  BILITOT 1.1 0.8 1.0  PROT 6.0* 6.5 6.7  ALBUMIN 2.3* 2.3* 2.3*    Recent Labs  12/17/14 0524  AMMONIA 23   CBC:  Recent Labs  12/14/14 1614  12/16/14 0327 12/17/14 0514 12/18/14 0615  WBC 7.9  < > 7.3 6.1 6.1  NEUTROABS 7.2  --   --  3.9 3.9  HGB 9.8*  < > 8.2* 8.4* 8.5*  HCT  31.6*  < > 26.7* 27.7* 27.2*  MCV 91.9  < > 92.7 92.6 93.2  PLT 155  < > 121* 149* 160  < > = values in this interval not displayed.  Lipid Panel:  Recent Labs  12/17/14 0514  HDL 22*   Cardiac Enzymes:  Recent Labs  12/14/14 2126 12/15/14 0239 12/15/14 0814  TROPONINI 0.06* 0.04* 0.06*     ASSESSMENT/PLAN:  Chronic kidney disease stage III - creatinine 1.59; check CMP  Chronic systolic CHF - continue metoprolol 25 mg 1/2 tab = 12.5 mg by mouth twice a day and Lasix 80 mg 2 tabs = 160 mg by mouth twice a day; not a candidate for ace due to CKD  Chronic atrial fibrillation - rate controlled; continue metoprolol 25 mg 1/2 tab = 12.5 mg by mouth twice a day and Coumadin 3 mg daily  Hypothyroidism - TSH 3.49; continue Synthroid 50 g daily; check TSH   Overactive bladder - continue Myrbetriq 50 mg 1 by mouth daily  Major depression - mood is stable; continue Cymbalta 20 mg 1 capsule by mouth daily  Protein calorie malnutrition, severe - albumin 2.3; increase Procel to 2 scoops by mouth twice a day  Constipation - continue senna S1 tab by mouth daily at bedtime when necessary and MiraLAX 17 g by mouth twice a day when necessary  Hypertension - continue metoprolol 25 mg 1/2 tab = 12.5 mg by mouth twice a day    Goals of care:  Long-term care    [facility], NP [facility] [phone]

## 2015-04-02 LAB — BASIC METABOLIC PANEL
BUN: 20 mg/dL (ref 4–21)
BUN: 20 mg/dL (ref 4–21)
Creatinine: 1.5 mg/dL — AB (ref 0.5–1.1)
Creatinine: 1.5 mg/dL — AB (ref 0.5–1.1)
GLUCOSE: 96 mg/dL
GLUCOSE: 96 mg/dL
Glucose: 96 mg/dL
POTASSIUM: 3.6 mmol/L (ref 3.4–5.3)
Potassium: 3.6 mmol/L (ref 3.4–5.3)
Potassium: 3.6 mmol/L (ref 3.4–5.3)
SODIUM: 142 mmol/L (ref 137–147)
SODIUM: 142 mmol/L (ref 137–147)
Sodium: 142 mmol/L (ref 137–147)

## 2015-04-02 LAB — HEPATIC FUNCTION PANEL
ALT: 8 U/L (ref 7–35)
ALT: 8 U/L (ref 7–35)
AST: 16 U/L (ref 13–35)
AST: 16 U/L (ref 13–35)
Alkaline Phosphatase: 76 U/L (ref 25–125)
Alkaline Phosphatase: 76 U/L (ref 25–125)
BILIRUBIN, TOTAL: 0.8 mg/dL
Bilirubin, Total: 0.8 mg/dL

## 2015-04-02 LAB — CBC AND DIFFERENTIAL
HCT: 30 % — AB (ref 36–46)
HCT: 30 % — AB (ref 36–46)
HEMOGLOBIN: 8.9 g/dL — AB (ref 12.0–16.0)
HEMOGLOBIN: 8.9 g/dL — AB (ref 12.0–16.0)
PLATELETS: 243 10*3/uL (ref 150–399)
Platelets: 243 10*3/uL (ref 150–399)
WBC: 3.4 10*3/mL
WBC: 3.4 10^3/mL

## 2015-04-02 LAB — TSH
TSH: 2.95 u[IU]/mL (ref 0.41–5.90)
TSH: 2.98 u[IU]/mL (ref 0.41–5.90)

## 2015-04-14 ENCOUNTER — Inpatient Hospital Stay (HOSPITAL_COMMUNITY): Payer: Medicare Other

## 2015-04-14 ENCOUNTER — Inpatient Hospital Stay (EMERGENCY_DEPARTMENT_HOSPITAL)
Admission: AD | Admit: 2015-04-14 | Discharge: 2015-04-14 | Disposition: A | Discharge status: Home / Self Care | Payer: Medicare Other | Source: Ambulatory Visit | Attending: Obstetrics & Gynecology | Admitting: Obstetrics & Gynecology

## 2015-04-14 ENCOUNTER — Encounter (HOSPITAL_COMMUNITY): Payer: Self-pay | Admitting: *Deleted

## 2015-04-14 DIAGNOSIS — R319 Hematuria, unspecified: Secondary | ICD-10-CM

## 2015-04-14 DIAGNOSIS — N950 Postmenopausal bleeding: Secondary | ICD-10-CM

## 2015-04-14 DIAGNOSIS — K625 Hemorrhage of anus and rectum: Secondary | ICD-10-CM | POA: Diagnosis not present

## 2015-04-14 LAB — CBC
HCT: 32.4 % — ABNORMAL LOW (ref 36.0–46.0)
HEMOGLOBIN: 9.7 g/dL — AB (ref 12.0–15.0)
MCH: 27.2 pg (ref 26.0–34.0)
MCHC: 29.9 g/dL — AB (ref 30.0–36.0)
MCV: 91 fL (ref 78.0–100.0)
PLATELETS: 248 10*3/uL (ref 150–400)
RBC: 3.56 MIL/uL — AB (ref 3.87–5.11)
RDW: 16 % — ABNORMAL HIGH (ref 11.5–15.5)
WBC: 5.4 10*3/uL (ref 4.0–10.5)

## 2015-04-14 LAB — TYPE AND SCREEN
ABO/RH(D): A POS
ANTIBODY SCREEN: NEGATIVE

## 2015-04-14 LAB — WET PREP, GENITAL
Clue Cells Wet Prep HPF POC: NONE SEEN
SPERM: NONE SEEN
Trich, Wet Prep: NONE SEEN
YEAST WET PREP: NONE SEEN

## 2015-04-14 LAB — URINE MICROSCOPIC-ADD ON

## 2015-04-14 LAB — URINALYSIS, ROUTINE W REFLEX MICROSCOPIC
Bilirubin Urine: NEGATIVE
GLUCOSE, UA: NEGATIVE mg/dL
Ketones, ur: NEGATIVE mg/dL
NITRITE: POSITIVE — AB
PROTEIN: 30 mg/dL — AB
Specific Gravity, Urine: 1.02 (ref 1.005–1.030)
pH: 7 (ref 5.0–8.0)

## 2015-04-14 LAB — ABO/RH: ABO/RH(D): A POS

## 2015-04-14 NOTE — Discharge Instructions (Signed)
Hematuria, Adult Hematuria is blood in your urine. It can be caused by a bladder infection, kidney infection, prostate infection, kidney stone, or cancer of your urinary tract. Infections can usually be treated with medicine, and a kidney stone usually will pass through your urine. If neither of these is the cause of your hematuria, further workup to find out the reason may be needed. It is very important that you tell your health care provider about any blood you see in your urine, even if the blood stops without treatment or happens without causing pain. Blood in your urine that happens and then stops and then happens again can be a symptom of a very serious condition. Also, pain is not a symptom in the initial stages of many urinary cancers. HOME CARE INSTRUCTIONS   Drink lots of fluid, 3-4 quarts a day. If you have been diagnosed with an infection, cranberry juice is especially recommended, in addition to large amounts of water.  Avoid caffeine, tea, and carbonated beverages because they tend to irritate the bladder.  Avoid alcohol because it may irritate the prostate.  Take all medicines as directed by your health care provider.  If you were prescribed an antibiotic medicine, finish it all even if you start to feel better.  If you have been diagnosed with a kidney stone, follow your health care provider's instructions regarding straining your urine to catch the stone.  Empty your bladder often. Avoid holding urine for long periods of time.  After a bowel movement, women should cleanse front to back. Use each tissue only once.  Empty your bladder before and after sexual intercourse if you are a female. SEEK MEDICAL CARE IF:  You develop back pain.  You have a fever.  You have a feeling of sickness in your stomach (nausea) or vomiting.  Your symptoms are not better in 3 days. Return sooner if you are getting worse. SEEK IMMEDIATE MEDICAL CARE IF:   You develop severe vomiting and  are unable to keep the medicine down.  You develop severe back or abdominal pain despite taking your medicines.  You begin passing a large amount of blood or clots in your urine.  You feel extremely weak or faint, or you pass out. MAKE SURE YOU:   Understand these instructions.  Will watch your condition.  Will get help right away if you are not doing well or get worse.   This information is not intended to replace advice given to you by your health care provider. Make sure you discuss any questions you have with your health care provider.   Document Released: 04/11/2005 Document Revised: 05/02/2014 Document Reviewed: 12/10/2012 Elsevier Interactive Patient Education 2016 Elsevier Inc. Atrophic Vaginitis Atrophic vaginitis is a condition in which the tissues that line the vagina become dry and thin. This condition is most common in women who have stopped having regular menstrual periods (menopause). This usually starts when a woman is 45-55 years old. Estrogen helps to keep the vagina moist. It stimulates the vagina to produce a clear fluid that lubricates the vagina for sexual intercourse. This fluid also protects the vagina from infection. Lack of estrogen can cause the lining of the vagina to get thinner and dryer. The vagina may also shrink in size. It may become less elastic. Atrophic vaginitis tends to get worse over time as a woman's estrogen level drops. CAUSES This condition is caused by the normal drop in estrogen that happens around the time of menopause. RISK FACTORS Certain conditions or   situations may lower a woman's estrogen level, which increases her risk of atrophic vaginitis. These include:  Taking medicine that blocks estrogen.  Having ovaries removed surgically.  Being treated for cancer with X-ray treatment (radiation) or medicines (chemotherapy).  Exercising very hard and often.  Having an eating disorder (anorexia).  Giving birth or breastfeeding.  Being  over the age of 50.  Smoking. SYMPTOMS Symptoms of this condition include:  Pain, soreness, or bleeding during sexual intercourse (dyspareunia).  Vaginal burning, irritation, or itching.  Pain or bleeding during a vaginal examination using a speculum (pelvic exam).  Loss of interest in sexual activity.  Having burning pain when passing urine.  Vaginal discharge that is Hagerty or yellow. In some cases, there are no symptoms. DIAGNOSIS This condition is diagnosed with a medical history and physical exam. This will include a pelvic exam that checks whether the inside of your vagina appears pale, thin, or dry. Rarely, you may also have other tests, including:  A urine test.  A test that checks the acid balance in your vaginal fluid (acid balance test). TREATMENT Treatment for this condition may depend on the severity of your symptoms. Treatment may include:  Using an over-the-counter vaginal lubricant before you have sexual intercourse.  Using a long-acting vaginal moisturizer.  Using low-dose vaginal estrogen for moderate to severe symptoms that do not respond to other treatments. Options include creams, tablets, and inserts (vaginal rings). Before using vaginal estrogen, tell your health care provider if you have a history of:  Breast cancer.  Endometrial cancer.  Blood clots.  Taking medicines. You may be able to take a daily pill for dyspareunia. Discuss all of the risks of this medicine with your health care provider. It is usually not recommended for women who have a family history or personal history of breast cancer. If your symptoms are very mild and you are not sexually active, you may not need treatment. HOME CARE INSTRUCTIONS  Take medicines only as directed by your health care provider. Do not use herbal or alternative medicines unless your health care provider says that you can.  Use over-the-counter creams, lubricants, or moisturizers for dryness only as  directed by your health care provider.  If your atrophic vaginitis is caused by menopause, discuss all of your menopausal symptoms and treatment options with your health care provider.  Do not douche.  Do not use products that can make your vagina dry. These include:  Scented feminine sprays.  Scented tampons.  Scented soaps.  If it hurts to have sex, talk with your sexual partner. SEEK MEDICAL CARE IF:  Your discharge looks different than normal.  Your vagina has an unusual smell.  You have new symptoms.  Your symptoms do not improve with treatment.  Your symptoms get worse.   This information is not intended to replace advice given to you by your health care provider. Make sure you discuss any questions you have with your health care provider.   Document Released: 08/26/2014 Document Reviewed: 08/26/2014 Elsevier Interactive Patient Education 2016 Elsevier Inc.  

## 2015-04-14 NOTE — MAU Note (Signed)
Urine in lab 

## 2015-04-14 NOTE — Progress Notes (Addendum)
Transferred to [facility] via [facility] and ambulance service with appropriate paperwork. Condition stable.

## 2015-04-14 NOTE — MAU Provider Note (Signed)
Chief Complaint: Vaginal Bleeding   First Provider Initiated Contact with Patient 04/14/15 1629      SUBJECTIVE HPI: Margaret [name] is a 74 y.o. post-menopausal female who presents to [facility] by [facility] from a long-term-care facility reporting small amount of bright red bleeding since this morning that she noticed when she was assisted to the toilet. She has a foley catheter in place for incontinence. No prior Hx of PMB, gyn cancers or abnormal Paps. Pt is on Coumadin for A fib. Last INR 3.4 on 03/20/15.   Associated signs and symptoms: Neg for fever, chills, abd pain, dysuria, vaginal discharge.   Past Medical History  Diagnosis Date  . NICM (nonischemic cardiomyopathy) (HCC)     EF 35%; cath 2/12 no CAD  . Systolic CHF, chronic (HCC)   . Severe mitral regurgitation     s/p complex mitral valve repair with cox maze + LAA clipping, removal of RV lead, and implantation of CRT-D in abdomen  . Cardiac arrest - ventricular fibrillation     in 1990s  . Atrial fibrillation (HCC)     amiodarone  . HTN (hypertension)   . HLD (hyperlipidemia)   . Achalasia     s/p dilation  . Recurrent UTI   . Obesity   . Anoxic brain injury (HCC) 1996    s/p cardiac arrest   . Automatic implantable cardioverter-defibrillator in situ   . Hypothyroidism   . TIA (transient ischemic attack)   . Chronic kidney disease (CKD), stage IV (severe) (HCC)     [name] 10/03/2013   OB History  No data available   Past Surgical History  Procedure Laterality Date  . Defibrillator generator explantation and reimplantation; and  08/02/2001  . Device migration with anticipated pocket revision  10/29/2003  . 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 Attempted implantation of an implantable cardioverter-  12/30/2004  . Cardioversion  04/14/2010  . Median sternotomy  07/16/2010  . Mitral valve repair  07/16/2010  . Cox maze procedure (complete biatrial lesion set with  clipping of  07/16/2010  . Removal of old endocardial right ventricular defibrillator lead.  07/16/2010  . Placement of dual chamber pacemaker and implantable cardiac  07/16/2010  . Placement of swan ganz pulmonary artery catheter via left femoral access.  07/16/2010  . Laparoscopic cholecystectomy    . Cataract extraction, bilateral    . Cystoscopy w/ stone manipulation    . Cataract extraction, bilateral Bilateral    Social History   Social History  . Marital Status: Married    Spouse Name: Homero Fellers  . Number of Children: 2  . Years of Education: college   Occupational History  . Retired     Conservator, museum/gallery, school system   Social History Main Topics  . Smoking status: Never Smoker   . Smokeless tobacco: Never Used  . Alcohol Use: No  . Drug Use: No  . Sexual Activity: Not Currently   Other Topics Concern  . Not on file   Social History Narrative   Patient lives with spouse.   Caffeine Use: 1 soda daily   No current facility-administered medications on file prior to encounter.   Current Outpatient Prescriptions on File Prior to Encounter  Medication Sig Dispense Refill  . acetaminophen (TYLENOL) 500 MG tablet Take 500 mg by mouth at bedtime.    . DULoxetine (CYMBALTA) 20 MG capsule Take 20 mg by mouth daily. For depression  5  . fluticasone (  FLONASE) 50 MCG/ACT nasal spray Place 2 sprays into both nostrils at bedtime. For allergies    . folic acid (FOLVITE) 1 MG tablet Take 1 tablet by mouth daily.    . furosemide (LASIX) 80 MG tablet Take 160 mg by mouth 2 (two) times daily. Take 2 tabs = 160 mg PO BID    . levothyroxine (SYNTHROID, LEVOTHROID) 50 MCG tablet Take 50 mcg by mouth daily before breakfast.    . loratadine (CLARITIN) 10 MG tablet Take 10 mg by mouth daily.    . Melatonin 5 MG TABS Take by mouth at bedtime.    . metoprolol tartrate (LOPRESSOR) 25 MG tablet Take 12.5 mg by mouth 2 (two) times daily.     . mirabegron ER (MYRBETRIQ) 50 MG TB24 tablet Take 50 mg by  mouth daily.    . Multiple Vitamins-Minerals (DECUBI-VITE PO) Take by mouth daily. For wound healing    . Polyethylene Glycol 3350 (MIRALAX PO) Take 17 g by mouth 2 (two) times daily as needed.    . potassium chloride SA (K-DUR,KLOR-CON) 20 MEQ tablet Take 40 mEq by mouth 2 (two) times daily.    Marland Kitchen senna-docusate (SENOKOT-S) 8.6-50 MG per tablet Take 1 tablet by mouth at bedtime as needed for mild constipation.    Marland Kitchen warfarin (COUMADIN) 3 MG tablet Take 3 mg by mouth daily.     No Known Allergies  I have reviewed the past Medical Hx, Surgical Hx, Social Hx, Allergies and Medications.   Review of Systems  Constitutional: Negative for fever and chills.  Gastrointestinal: Negative for abdominal pain.  Genitourinary: Positive for vaginal bleeding. Negative for dysuria and vaginal discharge.  Hematological: Does not bruise/bleed easily.    OBJECTIVE Patient Vitals for the past 24 hrs:  BP Temp Temp src Pulse Resp  04/14/15 1258 136/80 mmHg 98.6 F (37 C) Oral 99 18   Constitutional: Well-developed, well-nourished female in no acute distress.  Cardiovascular: normal rate Respiratory: normal rate and effort.  GI: Abd soft, non-tender. MS: Extremities nontender, 1+pedal edema, limited ROM of LE's bilat. Right lower leg erythematous, NT, normal temp. Inner thighs erythematous. Two ~3 mm sores on left inner thigh. (Not bleeding) Appearance of inner thighs C/W chronic exposure to moisture.  Neurologic: Alert and oriented x 4. Occasionally forgets contents of current conversation or repeats herself.  GU: SPECULUM EXAM: NEFG except for 2 mm sore superior to clitoral hood (not bleeding). Frank blood noted around urethral meatus. Foley cath in place. When sterile spec was placed scant blood was mixed in w/ small amount of this straw-colored discharge. Suspect blood was introduced by speculum and not coming from vagina, cervix or uterus. Cervix incompletely visualized, but was was visible was pink and  smooth.  BIMANUAL: cervix closed, smooth; uterus too small to palpate, no adnexal tenderness or masses. No CMT.  LAB RESULTS Results for orders placed or performed during the hospital encounter of 04/14/15 (from the past 24 hour(s))  CBC     Status: Abnormal   Collection Time: 04/14/15  2:22 PM  Result Value Ref Range   WBC 5.4 4.0 - 10.5 K/uL   RBC 3.56 (L) 3.87 - 5.11 MIL/uL   Hemoglobin 9.7 (L) 12.0 - 15.0 g/dL   HCT 16.1 (L) 09.6 - 04.5 %   MCV 91.0 78.0 - 100.0 fL   MCH 27.2 26.0 - 34.0 pg   MCHC 29.9 (L) 30.0 - 36.0 g/dL   RDW 40.9 (H) 81.1 - 91.4 %   Platelets 248  150 - 400 K/uL  Type and screen     Status: None   Collection Time: 04/14/15  2:22 PM  Result Value Ref Range   ABO/RH(D) A POS    Antibody Screen NEG    Sample Expiration 04/17/2015   Wet prep, genital     Status: Abnormal   Collection Time: 04/14/15  4:05 PM  Result Value Ref Range   Yeast Wet Prep HPF POC NONE SEEN NONE SEEN   Trich, Wet Prep NONE SEEN NONE SEEN   Clue Cells Wet Prep HPF POC NONE SEEN NONE SEEN   WBC, Wet Prep HPF POC FEW (A) NONE SEEN   Sperm NONE SEEN     IMAGING US Transvaginal Non-ob  04/14/2015  CLINICAL DATA:  Postmenopausal bleeding. EXAM: TRANSABDOMINAL AND TRANSVAGINAL ULTRASOUND OF PELVIS TECHNIQUE: Both transabdominal and transvaginal ultrasound examinations of the pelvis were performed. Transabdominal technique was performed for global imaging of the pelvis including uterus, ovaries, adnexal regions, and pelvic cul-de-sac. It was necessary to proceed with endovaginal exam following the transabdominal exam to visualize the uterus, endometrium and adnexa. COMPARISON:  None FINDINGS: Uterus Measurements: 5.8 x 2.4 x 3.6 cm. Calcifications noted peripherally. Endometrium Thickness: 2.6 mm.  No focal abnormality visualized. Right ovary Measurements: Not visualized. No adnexal mass noted. Left ovary Measurements: Not visualize. No adnexal mass noted. Other findings No abnormal free fluid.  IMPRESSION: 1. In the setting of post-menopausal bleeding, this is consistent with a benign etiology such as endometrial atrophy. If bleeding remains unresponsive to hormonal or medical therapy, sonohysterogram should be considered for focal lesion work-up. (Ref: Radiological Reasoning: Algorithmic Workup of Abnormal Vaginal Bleeding with Endovaginal Sonography and Sonohysterography. AJR 2008; 003:[id number]) Electronically Signed   By: Signa Kell M.D.   On: 04/14/2015 15:22   US Pelvis Complete  04/14/2015  CLINICAL DATA:  Postmenopausal bleeding. EXAM: TRANSABDOMINAL AND TRANSVAGINAL ULTRASOUND OF PELVIS TECHNIQUE: Both transabdominal and transvaginal ultrasound examinations of the pelvis were performed. Transabdominal technique was performed for global imaging of the pelvis including uterus, ovaries, adnexal regions, and pelvic cul-de-sac. It was necessary to proceed with endovaginal exam following the transabdominal exam to visualize the uterus, endometrium and adnexa. COMPARISON:  None FINDINGS: Uterus Measurements: 5.8 x 2.4 x 3.6 cm. Calcifications noted peripherally. Endometrium Thickness: 2.6 mm.  No focal abnormality visualized. Right ovary Measurements: Not visualized. No adnexal mass noted. Left ovary Measurements: Not visualize. No adnexal mass noted. Other findings No abnormal free fluid. IMPRESSION: 1. In the setting of post-menopausal bleeding, this is consistent with a benign etiology such as endometrial atrophy. If bleeding remains unresponsive to hormonal or medical therapy, sonohysterogram should be considered for focal lesion work-up. (Ref: Radiological Reasoning: Algorithmic Workup of Abnormal Vaginal Bleeding with Endovaginal Sonography and Sonohysterography. AJR 2008; [id number]) Electronically Signed   By: Signa Kell M.D.   On: 04/14/2015 15:22    MAU COURSE Spec exam, Wet prep, pelvic US, UA.  Discussed pt Hx, labs, exam and specifically endometrial stripe 2.6 mm w/ Dr.  Debroah Loop. Pt will not need F/U in office for endometrial Bx or other testing.   Discussed w/ pt and family that blood appears to by coming from urethra and that endometrial stripe is normal. Bleeding possible from trauma.   MDM 74 year-old female w/ bleeding most-likely only from urethra, but cannot rule-out PMB. W/ normal endometrial stripe, PMB is most-likely from atrophy. No emergent condition identified.   ASSESSMENT 1. Painless hematuria   2. Postmenopausal bleeding     PLAN Discharge  home in stable condition. PMB Precautions Will send in-basket message to PCP RE: hematuria/frank bleeding from urethra Urine culture pending Follow-up Information    Follow up with Allean Found, MD.   Specialty:  Family Medicine   Why:  As needed if symptoms worsen   Contact information:   [address] [place] [address] [phone]       Follow up with [facility].   Specialty:  Obstetrics and Gynecology   Why:  For non-emergent gynecology care   Contact information:   [address] [place] [address] [phone]        Medication List    STOP taking these medications        warfarin 3 MG tablet  Commonly known as:  COUMADIN      TAKE these medications        acetaminophen 500 MG tablet  Commonly known as:  TYLENOL  Take 500 mg by mouth at bedtime.     DECUBI-VITE PO  Take by mouth daily. For wound healing     DULoxetine 20 MG capsule  Commonly known as:  CYMBALTA  Take 20 mg by mouth daily. For depression     fluticasone 50 MCG/ACT nasal spray  Commonly known as:  FLONASE  Place 2 sprays into both nostrils at bedtime. For allergies     folic acid 1 MG tablet  Commonly known as:  FOLVITE  Take 1 tablet by mouth daily.     furosemide 80 MG tablet  Commonly known as:  LASIX  Take 160 mg by mouth 2 (two) times daily. Take 2 tabs = 160 mg PO BID     levothyroxine 50 MCG tablet  Commonly known as:   SYNTHROID, LEVOTHROID  Take 50 mcg by mouth daily before breakfast.     loratadine 10 MG tablet  Commonly known as:  CLARITIN  Take 10 mg by mouth daily.     Melatonin 5 MG Tabs  Take 5 mg by mouth at bedtime.     metoprolol tartrate 25 MG tablet  Commonly known as:  LOPRESSOR  Take 12.5 mg by mouth 2 (two) times daily.     MIRALAX PO  Take 17 g by mouth 2 (two) times daily as needed.     MYRBETRIQ 50 MG Tb24 tablet  Generic drug:  mirabegron ER  Take 50 mg by mouth daily.     potassium chloride SA 20 MEQ tablet  Commonly known as:  K-DUR,KLOR-CON  Take 40 mEq by mouth 2 (two) times daily.     senna-docusate 8.6-50 MG tablet  Commonly known as:  Senokot-S  Take 1 tablet by mouth at bedtime as needed for mild constipation.        [place], CNM 04/14/2015  4:45 PM

## 2015-04-14 NOTE — MAU Note (Signed)
Transfer from longterm care facility.  Vag bleeding noted today. Has indwelling foley

## 2015-04-14 NOTE — Progress Notes (Signed)
EMS notified for transport back to [facility].

## 2015-04-16 ENCOUNTER — Inpatient Hospital Stay (HOSPITAL_COMMUNITY)
Admission: EM | Admit: 2015-04-16 | Discharge: 2015-04-22 | DRG: 378 | Disposition: A | Discharge status: Skilled Nursing Facility | Payer: Medicare Other | Attending: Internal Medicine | Admitting: Internal Medicine

## 2015-04-16 ENCOUNTER — Encounter (HOSPITAL_COMMUNITY): Payer: Self-pay | Admitting: *Deleted

## 2015-04-16 DIAGNOSIS — L03115 Cellulitis of right lower limb: Secondary | ICD-10-CM | POA: Diagnosis present

## 2015-04-16 DIAGNOSIS — I428 Other cardiomyopathies: Secondary | ICD-10-CM

## 2015-04-16 DIAGNOSIS — N3281 Overactive bladder: Secondary | ICD-10-CM | POA: Diagnosis present

## 2015-04-16 DIAGNOSIS — E876 Hypokalemia: Secondary | ICD-10-CM | POA: Diagnosis present

## 2015-04-16 DIAGNOSIS — Z8674 Personal history of sudden cardiac arrest: Secondary | ICD-10-CM

## 2015-04-16 DIAGNOSIS — N183 Chronic kidney disease, stage 3 unspecified: Secondary | ICD-10-CM | POA: Diagnosis present

## 2015-04-16 DIAGNOSIS — Z823 Family history of stroke: Secondary | ICD-10-CM

## 2015-04-16 DIAGNOSIS — Z66 Do not resuscitate: Secondary | ICD-10-CM | POA: Diagnosis present

## 2015-04-16 DIAGNOSIS — Z8673 Personal history of transient ischemic attack (TIA), and cerebral infarction without residual deficits: Secondary | ICD-10-CM | POA: Diagnosis not present

## 2015-04-16 DIAGNOSIS — K625 Hemorrhage of anus and rectum: Secondary | ICD-10-CM | POA: Diagnosis present

## 2015-04-16 DIAGNOSIS — Z993 Dependence on wheelchair: Secondary | ICD-10-CM

## 2015-04-16 DIAGNOSIS — Z22322 Carrier or suspected carrier of Methicillin resistant Staphylococcus aureus: Secondary | ICD-10-CM

## 2015-04-16 DIAGNOSIS — I4891 Unspecified atrial fibrillation: Secondary | ICD-10-CM | POA: Diagnosis present

## 2015-04-16 DIAGNOSIS — F0390 Unspecified dementia without behavioral disturbance: Secondary | ICD-10-CM | POA: Diagnosis present

## 2015-04-16 DIAGNOSIS — I482 Chronic atrial fibrillation, unspecified: Secondary | ICD-10-CM | POA: Diagnosis present

## 2015-04-16 DIAGNOSIS — I10 Essential (primary) hypertension: Secondary | ICD-10-CM | POA: Diagnosis present

## 2015-04-16 DIAGNOSIS — Z79899 Other long term (current) drug therapy: Secondary | ICD-10-CM | POA: Diagnosis not present

## 2015-04-16 DIAGNOSIS — Z7901 Long term (current) use of anticoagulants: Secondary | ICD-10-CM | POA: Diagnosis not present

## 2015-04-16 DIAGNOSIS — L03116 Cellulitis of left lower limb: Secondary | ICD-10-CM | POA: Diagnosis not present

## 2015-04-16 DIAGNOSIS — I5022 Chronic systolic (congestive) heart failure: Secondary | ICD-10-CM | POA: Diagnosis present

## 2015-04-16 DIAGNOSIS — Z95810 Presence of automatic (implantable) cardiac defibrillator: Secondary | ICD-10-CM

## 2015-04-16 DIAGNOSIS — I130 Hypertensive heart and chronic kidney disease with heart failure and stage 1 through stage 4 chronic kidney disease, or unspecified chronic kidney disease: Secondary | ICD-10-CM | POA: Diagnosis present

## 2015-04-16 DIAGNOSIS — E785 Hyperlipidemia, unspecified: Secondary | ICD-10-CM | POA: Diagnosis present

## 2015-04-16 DIAGNOSIS — I429 Cardiomyopathy, unspecified: Secondary | ICD-10-CM | POA: Diagnosis present

## 2015-04-16 DIAGNOSIS — E039 Hypothyroidism, unspecified: Secondary | ICD-10-CM | POA: Diagnosis present

## 2015-04-16 DIAGNOSIS — N184 Chronic kidney disease, stage 4 (severe): Secondary | ICD-10-CM | POA: Diagnosis present

## 2015-04-16 LAB — I-STAT CHEM 8, ED
BUN: 28 mg/dL — ABNORMAL HIGH (ref 6–20)
CREATININE: 1.8 mg/dL — AB (ref 0.44–1.00)
Calcium, Ion: 1.06 mmol/L — ABNORMAL LOW (ref 1.13–1.30)
Chloride: 102 mmol/L (ref 101–111)
Glucose, Bld: 101 mg/dL — ABNORMAL HIGH (ref 65–99)
HEMATOCRIT: 30 % — AB (ref 36.0–46.0)
HEMOGLOBIN: 10.2 g/dL — AB (ref 12.0–15.0)
POTASSIUM: 3.8 mmol/L (ref 3.5–5.1)
SODIUM: 141 mmol/L (ref 135–145)
TCO2: 26 mmol/L (ref 0–100)

## 2015-04-16 LAB — CBC WITH DIFFERENTIAL/PLATELET
BASOS ABS: 0 10*3/uL (ref 0.0–0.1)
BASOS PCT: 1 %
EOS ABS: 0.3 10*3/uL (ref 0.0–0.7)
Eosinophils Relative: 6 %
HEMATOCRIT: 29.1 % — AB (ref 36.0–46.0)
HEMOGLOBIN: 8.9 g/dL — AB (ref 12.0–15.0)
Lymphocytes Relative: 24 %
Lymphs Abs: 1 10*3/uL (ref 0.7–4.0)
MCH: 28.3 pg (ref 26.0–34.0)
MCHC: 30.6 g/dL (ref 30.0–36.0)
MCV: 92.4 fL (ref 78.0–100.0)
Monocytes Absolute: 0.3 10*3/uL (ref 0.1–1.0)
Monocytes Relative: 7 %
NEUTROS ABS: 2.6 10*3/uL (ref 1.7–7.7)
NEUTROS PCT: 62 %
Platelets: 201 10*3/uL (ref 150–400)
RBC: 3.15 MIL/uL — AB (ref 3.87–5.11)
RDW: 16 % — ABNORMAL HIGH (ref 11.5–15.5)
WBC: 4.2 10*3/uL (ref 4.0–10.5)

## 2015-04-16 LAB — CBC
HEMATOCRIT: 31.1 % — AB (ref 36.0–46.0)
Hemoglobin: 9.1 g/dL — ABNORMAL LOW (ref 12.0–15.0)
MCH: 26.9 pg (ref 26.0–34.0)
MCHC: 29.3 g/dL — AB (ref 30.0–36.0)
MCV: 92 fL (ref 78.0–100.0)
PLATELETS: 225 10*3/uL (ref 150–400)
RBC: 3.38 MIL/uL — ABNORMAL LOW (ref 3.87–5.11)
RDW: 16 % — AB (ref 11.5–15.5)
WBC: 4.3 10*3/uL (ref 4.0–10.5)

## 2015-04-16 LAB — PROTIME-INR
INR: 1.52 — AB (ref 0.00–1.49)
Prothrombin Time: 18.4 seconds — ABNORMAL HIGH (ref 11.6–15.2)

## 2015-04-16 LAB — POC OCCULT BLOOD, ED: FECAL OCCULT BLD: POSITIVE — AB

## 2015-04-16 MED ORDER — CEFAZOLIN SODIUM 1-5 GM-% IV SOLN
1.0000 g | Freq: Three times a day (TID) | INTRAVENOUS | Status: DC
  Administered 2015-04-16 – 2015-04-17 (×2): 1 g via INTRAVENOUS
  Filled 2015-04-16 (×6): qty 50

## 2015-04-16 MED ORDER — PANTOPRAZOLE SODIUM 40 MG IV SOLR
40.0000 mg | INTRAVENOUS | Status: DC
  Administered 2015-04-18: 40 mg via INTRAVENOUS
  Filled 2015-04-16: qty 40

## 2015-04-16 MED ORDER — LEVOTHYROXINE SODIUM 100 MCG IV SOLR: 25.0000 ug | Freq: Every day | INTRAVENOUS | Status: DC

## 2015-04-16 MED ORDER — CEFAZOLIN SODIUM 1-5 GM-% IV SOLN
1.0000 g | Freq: Three times a day (TID) | INTRAVENOUS | Status: DC
  Filled 2015-04-16 (×2): qty 50

## 2015-04-16 MED ORDER — SODIUM CHLORIDE 0.9 % IJ SOLN
3.0000 mL | Freq: Two times a day (BID) | INTRAMUSCULAR | Status: DC
  Administered 2015-04-18 – 2015-04-21 (×5): 3 mL via INTRAVENOUS

## 2015-04-16 MED ORDER — PANTOPRAZOLE SODIUM 40 MG IV SOLR
40.0000 mg | INTRAVENOUS | Status: DC
  Administered 2015-04-16: 40 mg via INTRAVENOUS
  Filled 2015-04-16: qty 40

## 2015-04-16 MED ORDER — SODIUM CHLORIDE 0.9 % IV SOLN: INTRAVENOUS | Status: AC

## 2015-04-16 MED ORDER — ACETAMINOPHEN 650 MG RE SUPP: 650.0000 mg | Freq: Four times a day (QID) | RECTAL | Status: DC | PRN

## 2015-04-16 MED ORDER — ACETAMINOPHEN 325 MG PO TABS: 650.0000 mg | ORAL_TABLET | Freq: Four times a day (QID) | ORAL | Status: DC | PRN

## 2015-04-16 MED ORDER — SODIUM CHLORIDE 0.9 % IV SOLN
INTRAVENOUS | Status: DC
  Administered 2015-04-16 – 2015-04-17 (×2): via INTRAVENOUS

## 2015-04-16 NOTE — ED Notes (Signed)
Pt arrives with foley in place, chronic, clear yellow urine noted.  No blood noted in urine.

## 2015-04-16 NOTE — Consult Note (Signed)
[facility] Consult Note  Referring Provider: No ref. provider found Primary Care Physician:  Allean Found, MD Primary Gastroenterologist:  Dr.  Antony Contras Complaint: Rectal versus vaginal bleeding. HPI: Margaret [name] is an 74 y.o. white female  nursing home resident who was found to have blood in the commode by her caretakers 2 days ago and was sent to [place] where she was evaluated and had a pelvic ultrasound and apparently was not felt having vaginal bleeding. She was returned to the nursing home and it is not clear the exact events since then but apparently she may have passed some more blood today and was sent to the emergency room. By ER notes she had heme positive stool and possibly visible blood on digital exam. She had a bowel movement in the emergency room and there is no mention of visible blood by the recording nurse. Patient has never had a colonoscopy and was told by her cardiologist that she probably wasn't a candidate due to severe congestive heart failure. She is on Coumadin for atrial fibrillation.  Past Medical History  Diagnosis Date  . NICM (nonischemic cardiomyopathy) (HCC)     EF 35%; cath 2/12 no CAD  . Systolic CHF, chronic (HCC)   . Severe mitral regurgitation     s/p complex mitral valve repair with cox maze + LAA clipping, removal of RV lead, and implantation of CRT-D in abdomen  . Cardiac arrest - ventricular fibrillation     in 1990s  . Atrial fibrillation (HCC)     amiodarone  . HTN (hypertension)   . HLD (hyperlipidemia)   . Achalasia     s/p dilation  . Recurrent UTI   . Obesity   . Anoxic brain injury (HCC) 1996    s/p cardiac arrest   . Automatic implantable cardioverter-defibrillator in situ   . Hypothyroidism   . TIA (transient ischemic attack)   . Chronic kidney disease (CKD), stage IV (severe) (HCC)     [name] 10/03/2013    Past Surgical History  Procedure Laterality Date  . Defibrillator generator explantation  and reimplantation; and  08/02/2001  . Device migration with anticipated pocket revision  10/29/2003  . 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 Attempted implantation of an implantable cardioverter-  12/30/2004  . Cardioversion  04/14/2010  . Median sternotomy  07/16/2010  . Mitral valve repair  07/16/2010  . Cox maze procedure (complete biatrial lesion set with clipping of  07/16/2010  . Removal of old endocardial right ventricular defibrillator lead.  07/16/2010  . Placement of dual chamber pacemaker and implantable cardiac  07/16/2010  . Placement of swan ganz pulmonary artery catheter via left femoral access.  07/16/2010  . Laparoscopic cholecystectomy    . Cataract extraction, bilateral    . Cystoscopy w/ stone manipulation    . Cataract extraction, bilateral Bilateral     Medications Prior to Admission  Medication Sig Dispense Refill  . acetaminophen (TYLENOL) 500 MG tablet Take 500 mg by mouth at bedtime.    . cephALEXin (KEFLEX) 500 MG capsule Take 500 mg by mouth 4 (four) times daily.    . DULoxetine (CYMBALTA) 20 MG capsule Take 20 mg by mouth daily. For depression  5  . fluticasone (FLONASE) 50 MCG/ACT nasal spray Place 2 sprays into both nostrils at bedtime. For allergies    . folic acid (FOLVITE) 1 MG tablet Take 1 tablet by mouth daily.    . furosemide (LASIX) 80  MG tablet Take 160 mg by mouth 2 (two) times daily. Take 2 tabs = 160 mg PO BID    . levothyroxine (SYNTHROID, LEVOTHROID) 50 MCG tablet Take 50 mcg by mouth daily before breakfast.    . loratadine (CLARITIN) 10 MG tablet Take 10 mg by mouth daily.    . Melatonin 5 MG TABS Take 5 mg by mouth at bedtime.     . metoprolol tartrate (LOPRESSOR) 25 MG tablet Take 12.5 mg by mouth 2 (two) times daily.     . mirabegron ER (MYRBETRIQ) 50 MG TB24 tablet Take 50 mg by mouth daily.    . Multiple Vitamins-Minerals (DECUBI-VITE PO) Take by mouth daily. For wound healing    . potassium  chloride SA (K-DUR,KLOR-CON) 20 MEQ tablet Take 40 mEq by mouth 2 (two) times daily.    . Protein (PROCEL 100 PO) Take 2 scoop by mouth 2 (two) times daily.    Marland Kitchen saccharomyces boulardii (FLORASTOR) 250 MG capsule Take 250 mg by mouth 2 (two) times daily.    Marland Kitchen warfarin (COUMADIN) 2 MG tablet Take 2 mg by mouth daily.    . Polyethylene Glycol 3350 (MIRALAX PO) Take 17 g by mouth 2 (two) times daily as needed.    . senna-docusate (SENOKOT-S) 8.6-50 MG per tablet Take 1 tablet by mouth at bedtime as needed for mild constipation.      Allergies: No Known Allergies  Family History  Problem Relation Age of Onset  . Stroke Mother   . Lung cancer Father   . Diabetes Mellitus II Brother     Social History:  reports that she has never smoked. She has never used smokeless tobacco. She reports that she does not drink alcohol or use illicit drugs.  Review of Systems: negative except as above, no abdominal pain   Blood pressure 146/49, pulse 82, temperature 98.4 F (36.9 C), temperature source Oral, resp. rate 16, SpO2 97 %. Head: Normocephalic, without obvious abnormality, atraumatic Neck: no adenopathy, no carotid bruit, no JVD, supple, symmetrical, trachea midline and thyroid not enlarged, symmetric, no tenderness/mass/nodules Resp: clear to auscultation bilaterally Cardio: regular rate and rhythm, S1, S2 normal, no murmur, click, rub or gallop GI: Abdomen soft slightly distended with normoactive bowel sounds. Rectal exam was not repeated. Extremities: extremities normal, atraumatic, no cyanosis or edema  Results for orders placed or performed during the hospital encounter of 04/16/15 (from the past 48 hour(s))  CBC with Differential     Status: Abnormal   Collection Time: 04/16/15  1:30 PM  Result Value Ref Range   WBC 4.2 4.0 - 10.5 K/uL   RBC 3.15 (L) 3.87 - 5.11 MIL/uL   Hemoglobin 8.9 (L) 12.0 - 15.0 g/dL   HCT 82.9 (L) 56.2 - 13.0 %   MCV 92.4 78.0 - 100.0 fL   MCH 28.3 26.0 - 34.0  pg   MCHC 30.6 30.0 - 36.0 g/dL   RDW 86.5 (H) 78.4 - 69.6 %   Platelets 201 150 - 400 K/uL   Neutrophils Relative % 62 %   Neutro Abs 2.6 1.7 - 7.7 K/uL   Lymphocytes Relative 24 %   Lymphs Abs 1.0 0.7 - 4.0 K/uL   Monocytes Relative 7 %   Monocytes Absolute 0.3 0.1 - 1.0 K/uL   Eosinophils Relative 6 %   Eosinophils Absolute 0.3 0.0 - 0.7 K/uL   Basophils Relative 1 %   Basophils Absolute 0.0 0.0 - 0.1 K/uL  Protime-INR     Status: Abnormal  Collection Time: 04/16/15  1:30 PM  Result Value Ref Range   Prothrombin Time 18.4 (H) 11.6 - 15.2 seconds   INR 1.52 (H) 0.00 - 1.49  I-Stat Chem 8, ED     Status: Abnormal   Collection Time: 04/16/15  1:34 PM  Result Value Ref Range   Sodium 141 135 - 145 mmol/L   Potassium 3.8 3.5 - 5.1 mmol/L   Chloride 102 101 - 111 mmol/L   BUN 28 (H) 6 - 20 mg/dL   Creatinine, Ser 1.10 (H) 0.44 - 1.00 mg/dL   Glucose, Bld 315 (H) 65 - 99 mg/dL   Calcium, Ion 9.45 (L) 1.13 - 1.30 mmol/L   TCO2 26 0 - 100 mmol/L   Hemoglobin 10.2 (L) 12.0 - 15.0 g/dL   HCT 85.9 (L) 29.2 - 44.6 %  POC occult blood, ED Provider will collect     Status: Abnormal   Collection Time: 04/16/15  2:04 PM  Result Value Ref Range   Fecal Occult Bld POSITIVE (A) NEGATIVE   No results found.  Assessment: Rectal versus vaginal bleeding apparent source not clear from history obtained Congestive heart failure On Coumadin Plan:  Will plan flexible sigmoidoscopy in the morning with tap water enema on call. Jamal Haskin C 04/16/2015, 4:36 PM  Pager [phone] If no answer or after 5 PM call [phone]

## 2015-04-16 NOTE — H&P (Signed)
[facility] History and Physical                                                                                    Margaret [name], is a 74 y.o. female  MRN: [id number]   DOB - 03/20/1941  Admit Date - 04/16/2015  Outpatient Primary MD for the patient is Allean Found, MD  Referring MD: Fredderick Phenix / ER  Consulting M.D:  Randa Evens / GI  PMH: Past Medical History  Diagnosis Date  . NICM (nonischemic cardiomyopathy) (HCC)     EF 35%; cath 2/12 no CAD  . Systolic CHF, chronic (HCC)   . Severe mitral regurgitation     s/p complex mitral valve repair with cox maze + LAA clipping, removal of RV lead, and implantation of CRT-D in abdomen  . Cardiac arrest - ventricular fibrillation     in 1990s  . Atrial fibrillation (HCC)     amiodarone  . HTN (hypertension)   . HLD (hyperlipidemia)   . Achalasia     s/p dilation  . Recurrent UTI   . Obesity   . Anoxic brain injury (HCC) 1996    s/p cardiac arrest   . Automatic implantable cardioverter-defibrillator in situ   . Hypothyroidism   . TIA (transient ischemic attack)   . Chronic kidney disease (CKD), stage IV (severe) (HCC)     [name] 10/03/2013      PSH: Past Surgical History  Procedure Laterality Date  . Defibrillator generator explantation and reimplantation; and  08/02/2001  . Device migration with anticipated pocket revision  10/29/2003  . 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 Attempted implantation of an implantable cardioverter-  12/30/2004  . Cardioversion  04/14/2010  . Median sternotomy  07/16/2010  . Mitral valve repair  07/16/2010  . Cox maze procedure (complete biatrial lesion set with clipping of  07/16/2010  . Removal of old endocardial right ventricular defibrillator lead.  07/16/2010  . Placement of dual chamber pacemaker and implantable cardiac  07/16/2010  . Placement of swan ganz pulmonary artery catheter via left femoral access.  07/16/2010  . Laparoscopic  cholecystectomy    . Cataract extraction, bilateral    . Cystoscopy w/ stone manipulation    . Cataract extraction, bilateral Bilateral      CC:  Chief Complaint  Patient presents with  . Vaginal Bleeding     HPI:  74 year old female resident of [name] nursing facility who was sent to the ER secondary to complaints of vaginal bleeding. He should have been evaluated at [facility] in [place] on 12/20 for same problem with the transvaginal ultrasound revealing no significant abnormalities. Of note patient has a chronic Foley catheter. Of note the provider at [facility] unable to clearly identify any vaginal bleeding and concerns were the patient was bleeding from the urethra at her catheter site. She was subsequently returned back to the nursing facility. She was sent back to the ER today with reports again of vaginal bleeding. Patient reports large volume blood after using the commode today. She reports the bleeding symptoms as described above have been ongoing for one week. She  denies constipation. Denies NSAIDs as she has not started any new medications. She reports that her cardiologist has stated she is medically unstable from a cardiac standpoint to tolerate a colonoscopy. She reports she was recently started on an antibiotic for right lower extremity cellulitis and has been taking this for 2 days. She denies diarrhea.  ER Evaluation and treatment:  EDP rectal exam with blood noted , fecal occult blood also positive  Review of Systems   In addition to the HPI above,  No Fever-chills, myalgias or other constitutional symptoms No Headache, changes with Vision or hearing, new weakness, tingling, numbness in any extremity, No problems swallowing food or Liquids, indigestion/reflux No Chest pain, Cough or Shortness of Breath, palpitations, orthopnea or DOE No Abdominal pain, N/V No dysuria, hematuria or flank pain- chronic Foley catheter No new skin rashes, lesions, masses or  bruises, No new joints pains-aches No recent weight gain or loss No polyuria, polydypsia or polyphagia,  *A full 10 point Review of Systems was done, except as stated above, all other Review of Systems were negative.  Social History Social History  Substance Use Topics  . Smoking status: Never Smoker   . Smokeless tobacco: Never Used  . Alcohol Use: No    Resides at: [facility] nursing facility  Lives with: N/A  Ambulatory status: rolling walker/ follows with facility physical therapy   Family History Family History  Problem Relation Age of Onset  . Stroke Mother   . Lung cancer Father   . Diabetes Mellitus II Brother      Prior to Admission medications   Medication Sig Start Date End Date Taking? Authorizing Provider  acetaminophen (TYLENOL) 500 MG tablet Take 500 mg by mouth at bedtime.   Yes Historical Provider, MD  cephALEXin (KEFLEX) 500 MG capsule Take 500 mg by mouth 4 (four) times daily.   Yes Historical Provider, MD  DULoxetine (CYMBALTA) 20 MG capsule Take 20 mg by mouth daily. For depression 09/19/14  Yes Historical Provider, MD  fluticasone (FLONASE) 50 MCG/ACT nasal spray Place 2 sprays into both nostrils at bedtime. For allergies 06/26/10  Yes Historical Provider, MD  folic acid (FOLVITE) 1 MG tablet Take 1 tablet by mouth daily. 08/31/10  Yes Historical Provider, MD  furosemide (LASIX) 80 MG tablet Take 160 mg by mouth 2 (two) times daily. Take 2 tabs = 160 mg PO BID   Yes Historical Provider, MD  levothyroxine (SYNTHROID, LEVOTHROID) 50 MCG tablet Take 50 mcg by mouth daily before breakfast.   Yes Historical Provider, MD  loratadine (CLARITIN) 10 MG tablet Take 10 mg by mouth daily.   Yes Historical Provider, MD  Melatonin 5 MG TABS Take 5 mg by mouth at bedtime.    Yes Historical Provider, MD  metoprolol tartrate (LOPRESSOR) 25 MG tablet Take 12.5 mg by mouth 2 (two) times daily.  08/31/10  Yes Historical Provider, MD  mirabegron ER (MYRBETRIQ) 50 MG TB24 tablet  Take 50 mg by mouth daily.   Yes Historical Provider, MD  Multiple Vitamins-Minerals (DECUBI-VITE PO) Take by mouth daily. For wound healing   Yes Historical Provider, MD  potassium chloride SA (K-DUR,KLOR-CON) 20 MEQ tablet Take 40 mEq by mouth 2 (two) times daily.   Yes Historical Provider, MD  Protein (PROCEL 100 PO) Take 2 scoop by mouth 2 (two) times daily.   Yes Historical Provider, MD  saccharomyces boulardii (FLORASTOR) 250 MG capsule Take 250 mg by mouth 2 (two) times daily.   Yes Historical Provider, MD  warfarin (COUMADIN) 2 MG tablet Take 2 mg by mouth daily.   Yes Historical Provider, MD  Polyethylene Glycol 3350 (MIRALAX PO) Take 17 g by mouth 2 (two) times daily as needed.    Historical Provider, MD  senna-docusate (SENOKOT-S) 8.6-50 MG per tablet Take 1 tablet by mouth at bedtime as needed for mild constipation. 12/18/14   Shanker Levora Dredge, MD    No Known Allergies  Physical Exam  Vitals  Blood pressure 145/97, pulse 89, temperature 97.8 F (36.6 C), temperature source Oral, resp. rate 18, SpO2 100 %.   General:  In no acute distress, appears  Chronically ill  Psych:  Normal affect, Denies Suicidal or Homicidal ideations, Awake Alert, Oriented X 3. Speech and thought patterns are clear and appropriate  Neuro:   No focal neurological deficits, CN II through XII intact, Strength 4/5 all 4 extremities, Sensation intact all 4 extremities.  ENT:  Ears and Eyes appear Normal, Conjunctivae clear, PER. Moist oral mucosa without erythema or exudates.  Neck:  Supple, No lymphadenopathy appreciated  Respiratory:  Symmetrical chest wall movement, Good air movement bilaterally, CTAB. Room Air  Cardiac:  RRR, No Murmurs, no LE edema noted, no JVD, No carotid bruits, peripheral pulses palpable at 2+  Abdomen:  Positive bowel sounds, Soft, Non tender, Non distended,  No masses appreciated, no obvious hepatosplenomegaly- additional rectal exam not completed  Skin:  No Cyanosis,  Normal Skin Turgor, No Skin Rash or Bruise.  Extremities: Symmetrical without obvious trauma or injury,  no effusions. Red cellulitic and erythematous skin changes to right lower extremity about 2 inches below the knee in a circumferential pattern with some early small bullous formation but no drainage  Data Review  CBC  Recent Labs Lab 04/14/15 1422 04/16/15 1330 04/16/15 1334  WBC 5.4 4.2  --   HGB 9.7* 8.9* 10.2*  HCT 32.4* 29.1* 30.0*  PLT 248 201  --   MCV 91.0 92.4  --   MCH 27.2 28.3  --   MCHC 29.9* 30.6  --   RDW 16.0* 16.0*  --   LYMPHSABS  --  1.0  --   MONOABS  --  0.3  --   EOSABS  --  0.3  --   BASOSABS  --  0.0  --     Chemistries   Recent Labs Lab 04/16/15 1334  NA 141  K 3.8  CL 102  GLUCOSE 101*  BUN 28*  CREATININE 1.80*    CrCl cannot be calculated (Unknown ideal weight.).  No results for input(s): TSH, T4TOTAL, T3FREE, THYROIDAB in the last 72 hours.  Invalid input(s): FREET3  Coagulation profile  Recent Labs Lab 04/16/15 1330  INR 1.52*    No results for input(s): DDIMER in the last 72 hours.  Cardiac Enzymes No results for input(s): CKMB, TROPONINI, MYOGLOBIN in the last 168 hours.  Invalid input(s): CK  Invalid input(s): POCBNP  Urinalysis    Component Value Date/Time   COLORURINE YELLOW 04/14/2015 1624   APPEARANCEUR CLEAR 04/14/2015 1624   LABSPEC 1.020 04/14/2015 1624   PHURINE 7.0 04/14/2015 1624   GLUCOSEU NEGATIVE 04/14/2015 1624   HGBUR LARGE* 04/14/2015 1624   BILIRUBINUR NEGATIVE 04/14/2015 1624   KETONESUR NEGATIVE 04/14/2015 1624   PROTEINUR 30* 04/14/2015 1624   UROBILINOGEN 4.0* 12/14/2014 1605   NITRITE POSITIVE* 04/14/2015 1624   LEUKOCYTESUR LARGE* 04/14/2015 1624    Imaging results:   US Transvaginal Non-ob  04/14/2015  CLINICAL DATA:  Postmenopausal bleeding. EXAM: TRANSABDOMINAL AND  TRANSVAGINAL ULTRASOUND OF PELVIS TECHNIQUE: Both transabdominal and transvaginal ultrasound examinations of  the pelvis were performed. Transabdominal technique was performed for global imaging of the pelvis including uterus, ovaries, adnexal regions, and pelvic cul-de-sac. It was necessary to proceed with endovaginal exam following the transabdominal exam to visualize the uterus, endometrium and adnexa. COMPARISON:  None FINDINGS: Uterus Measurements: 5.8 x 2.4 x 3.6 cm. Calcifications noted peripherally. Endometrium Thickness: 2.6 mm.  No focal abnormality visualized. Right ovary Measurements: Not visualized. No adnexal mass noted. Left ovary Measurements: Not visualize. No adnexal mass noted. Other findings No abnormal free fluid. IMPRESSION: 1. In the setting of post-menopausal bleeding, this is consistent with a benign etiology such as endometrial atrophy. If bleeding remains unresponsive to hormonal or medical therapy, sonohysterogram should be considered for focal lesion work-up. (Ref: Radiological Reasoning: Algorithmic Workup of Abnormal Vaginal Bleeding with Endovaginal Sonography and Sonohysterography. AJR 2008; 409:W11-91) Electronically Signed   By: Signa Kell M.D.   On: 04/14/2015 15:22   US Pelvis Complete  04/14/2015  CLINICAL DATA:  Postmenopausal bleeding. EXAM: TRANSABDOMINAL AND TRANSVAGINAL ULTRASOUND OF PELVIS TECHNIQUE: Both transabdominal and transvaginal ultrasound examinations of the pelvis were performed. Transabdominal technique was performed for global imaging of the pelvis including uterus, ovaries, adnexal regions, and pelvic cul-de-sac. It was necessary to proceed with endovaginal exam following the transabdominal exam to visualize the uterus, endometrium and adnexa. COMPARISON:  None FINDINGS: Uterus Measurements: 5.8 x 2.4 x 3.6 cm. Calcifications noted peripherally. Endometrium Thickness: 2.6 mm.  No focal abnormality visualized. Right ovary Measurements: Not visualized. No adnexal mass noted. Left ovary Measurements: Not visualize. No adnexal mass noted. Other findings No  abnormal free fluid. IMPRESSION: 1. In the setting of post-menopausal bleeding, this is consistent with a benign etiology such as endometrial atrophy. If bleeding remains unresponsive to hormonal or medical therapy, sonohysterogram should be considered for focal lesion work-up. (Ref: Radiological Reasoning: Algorithmic Workup of Abnormal Vaginal Bleeding with Endovaginal Sonography and Sonohysterography. AJR 2008; 478:G95-62) Electronically Signed   By: Signa Kell M.D.   On: 04/14/2015 15:22      Assessment & Plan  Principal Problem:   Rectal bleeding - telemetry - gastroenterology consulted - hemoglobin stable- cycle CBC every 12 hours - warfarin on hold - previously instructed too unstable from cardiac standpoint to tolerate colonoscopy-question if can tolerate flexible sigmoidoscopy - did have elevated BUN which could be reflective of upper GI etiology so as precaution have initiated PPI - Clear liquids  Active Problems:   Cellulitis of right leg - has been on antibiotic 2 days- med rec list Keflex - Ancef IV every 8 hours    Nonischemic cardiomyopathy  - EF 30-35% on recent echocardiogram with mild to moderate tricuspid regurgitation without obvious pulmonary hypertension - diuretics currently on hold in setting of rectal bleeding - IV fluid hydration but monitor for volume overload    CKD (chronic kidney disease), stage III - Mild renal insufficiency as well as azotemia and setting of rectal bleeding - follow labs    Hypertension - Initial blood pressure somewhat suboptimal so antihypertensive medications not ordered at time of admission    Chronic atrial fibrillation  - Currently rate controlled -CHADVASc=6 - warfarin on hold and will ask pharmacy to assist with management -INR subtherapeutic at presentation (1.52)    Automatic implantable cardioverter-defibrillator in situ - History of sudden cardiac death and known EF 30-35%    Hyperlipidemia - Not on  statin    Hypothyroidism - Synthroid converted to IV  Overactive bladder/chronic foley - Hold Myrbetriq for now    DVT Prophylaxis: preadmission warfarin on hold-SCDs  Family Communication:    Husband at bedside  Code Status:   DO NOT RESUSCITATE  Condition:   stable  Discharge disposition: anticipate discharge back to nursing facility once medically stable  Time spent in minutes : 60      Rolando Whitby L. ANP on 04/16/2015 at 4:44 PM  You may contact me by going to [email or web address] - password [email or web address]  I am available from 7a-7p but please confirm I am on the schedule by going to Amion as above.   After 7p please contact night coverage person covering me after hours  [facility]

## 2015-04-16 NOTE — ED Notes (Signed)
Attempted report 

## 2015-04-16 NOTE — ED Provider Notes (Signed)
CSN: [id number]     Arrival date & time 04/16/15  1210 History   First MD Initiated Contact with Patient 04/16/15 1222     Chief Complaint  Patient presents with  . Vaginal Bleeding     (Consider location/radiation/quality/duration/timing/severity/associated sxs/prior Treatment) HPI Margaret [name] is a 74 y.o. female with multiple medical problems, presents to emergency department complaining of vaginal bleeding. Patient is coming from [facility], patient states she has had "large amount of bright red blood from vaginal area" for about a week. Patient was taken to [facility] 2 days ago, where it was thought that her bleeding was coming from urethra, patient does have an indwelling Foley. Patient denies any blood in her stool. Patient denies any blood in her Foley bag. She has not had any trauma. She denies any abdominal or vaginal pain. Patient has not had any vaginal bleeding since menopause. Pt does take coumadin for cardiac problems.   Past Medical History  Diagnosis Date  . NICM (nonischemic cardiomyopathy) (HCC)     EF 35%; cath 2/12 no CAD  . Systolic CHF, chronic (HCC)   . Severe mitral regurgitation     s/p complex mitral valve repair with cox maze + LAA clipping, removal of RV lead, and implantation of CRT-D in abdomen  . Cardiac arrest - ventricular fibrillation     in 1990s  . Atrial fibrillation (HCC)     amiodarone  . HTN (hypertension)   . HLD (hyperlipidemia)   . Achalasia     s/p dilation  . Recurrent UTI   . Obesity   . Anoxic brain injury (HCC) 1996    s/p cardiac arrest   . Automatic implantable cardioverter-defibrillator in situ   . Hypothyroidism   . TIA (transient ischemic attack)   . Chronic kidney disease (CKD), stage IV (severe) (HCC)     [name] 10/03/2013   Past Surgical History  Procedure Laterality Date  . Defibrillator generator explantation and reimplantation; and  08/02/2001  . Device migration with anticipated pocket revision   10/29/2003  . 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 Attempted implantation of an implantable cardioverter-  12/30/2004  . Cardioversion  04/14/2010  . Median sternotomy  07/16/2010  . Mitral valve repair  07/16/2010  . Cox maze procedure (complete biatrial lesion set with clipping of  07/16/2010  . Removal of old endocardial right ventricular defibrillator lead.  07/16/2010  . Placement of dual chamber pacemaker and implantable cardiac  07/16/2010  . Placement of swan ganz pulmonary artery catheter via left femoral access.  07/16/2010  . Laparoscopic cholecystectomy    . Cataract extraction, bilateral    . Cystoscopy w/ stone manipulation    . Cataract extraction, bilateral Bilateral    Family History  Problem Relation Age of Onset  . Stroke Mother   . Lung cancer Father   . Diabetes Mellitus II Brother    Social History  Substance Use Topics  . Smoking status: Never Smoker   . Smokeless tobacco: Never Used  . Alcohol Use: No   OB History    No data available     Review of Systems  Constitutional: Negative for fever and chills.  Respiratory: Negative for cough, chest tightness and shortness of breath.   Cardiovascular: Negative for chest pain, palpitations and leg swelling.  Gastrointestinal: Negative for nausea, vomiting, abdominal pain and diarrhea.  Genitourinary: Positive for vaginal bleeding. Negative for dysuria, flank pain, vaginal discharge, vaginal pain  and pelvic pain.  Musculoskeletal: Negative for myalgias, arthralgias, neck pain and neck stiffness.  Skin: Negative for rash.  Neurological: Negative for dizziness, weakness and headaches.  All other systems reviewed and are negative.     Allergies  Review of patient's allergies indicates no known allergies.  Home Medications   Prior to Admission medications   Medication Sig Start Date End Date Taking? Authorizing Provider  acetaminophen (TYLENOL) 500 MG tablet Take  500 mg by mouth at bedtime.    Historical Provider, MD  DULoxetine (CYMBALTA) 20 MG capsule Take 20 mg by mouth daily. For depression 09/19/14   Historical Provider, MD  fluticasone (FLONASE) 50 MCG/ACT nasal spray Place 2 sprays into both nostrils at bedtime. For allergies 06/26/10   Historical Provider, MD  folic acid (FOLVITE) 1 MG tablet Take 1 tablet by mouth daily. 08/31/10   Historical Provider, MD  furosemide (LASIX) 80 MG tablet Take 160 mg by mouth 2 (two) times daily. Take 2 tabs = 160 mg PO BID    Historical Provider, MD  levothyroxine (SYNTHROID, LEVOTHROID) 50 MCG tablet Take 50 mcg by mouth daily before breakfast.    Historical Provider, MD  loratadine (CLARITIN) 10 MG tablet Take 10 mg by mouth daily.    Historical Provider, MD  Melatonin 5 MG TABS Take by mouth at bedtime.    Historical Provider, MD  metoprolol tartrate (LOPRESSOR) 25 MG tablet Take 12.5 mg by mouth 2 (two) times daily.  08/31/10   Historical Provider, MD  mirabegron ER (MYRBETRIQ) 50 MG TB24 tablet Take 50 mg by mouth daily.    Historical Provider, MD  Multiple Vitamins-Minerals (DECUBI-VITE PO) Take by mouth daily. For wound healing    Historical Provider, MD  Polyethylene Glycol 3350 (MIRALAX PO) Take 17 g by mouth 2 (two) times daily as needed.    Historical Provider, MD  potassium chloride SA (K-DUR,KLOR-CON) 20 MEQ tablet Take 40 mEq by mouth 2 (two) times daily.    Historical Provider, MD  senna-docusate (SENOKOT-S) 8.6-50 MG per tablet Take 1 tablet by mouth at bedtime as needed for mild constipation. 12/18/14   Shanker Levora Dredge, MD  warfarin (COUMADIN) 3 MG tablet Take 3 mg by mouth daily.    Historical Provider, MD   BP 145/59 mmHg  Pulse 81  Temp(Src) 98.4 F (36.9 C) (Oral)  Resp 16  SpO2 99% Physical Exam  Constitutional: She is oriented to person, place, and time. She appears well-developed and well-nourished. No distress.  HENT:  Head: Normocephalic.  Eyes: Conjunctivae are normal.  Neck: Neck  supple.  Cardiovascular: Normal rate, regular rhythm and normal heart sounds.   Pulmonary/Chest: Effort normal and breath sounds normal. No respiratory distress. She has no wheezes. She has no rales.  Abdominal: Soft. Bowel sounds are normal. She exhibits no distension. There is no tenderness. There is no rebound.  Genitourinary:  Indwelling Foley, no bleeding around the Foley site. Normal external genitalia. Normal vaginal canal, cervix, with no signs of bleeding. White vaginal discharge present. Dried blood around rectum. No external hemorrhoids. Pink mucus on digital rectal exam.  Musculoskeletal: She exhibits no edema.  Neurological: She is alert and oriented to person, place, and time.  Skin: Skin is warm and dry.  Psychiatric: She has a normal mood and affect. Her behavior is normal.  Nursing note and vitals reviewed.   ED Course  Procedures (including critical care time) Labs Review Labs Reviewed  CBC WITH DIFFERENTIAL/PLATELET  PROTIME-INR  I-STAT CHEM 8, ED  Imaging Review US Transvaginal Non-ob  04/14/2015  CLINICAL DATA:  Postmenopausal bleeding. EXAM: TRANSABDOMINAL AND TRANSVAGINAL ULTRASOUND OF PELVIS TECHNIQUE: Both transabdominal and transvaginal ultrasound examinations of the pelvis were performed. Transabdominal technique was performed for global imaging of the pelvis including uterus, ovaries, adnexal regions, and pelvic cul-de-sac. It was necessary to proceed with endovaginal exam following the transabdominal exam to visualize the uterus, endometrium and adnexa. COMPARISON:  None FINDINGS: Uterus Measurements: 5.8 x 2.4 x 3.6 cm. Calcifications noted peripherally. Endometrium Thickness: 2.6 mm.  No focal abnormality visualized. Right ovary Measurements: Not visualized. No adnexal mass noted. Left ovary Measurements: Not visualize. No adnexal mass noted. Other findings No abnormal free fluid. IMPRESSION: 1. In the setting of post-menopausal bleeding, this is consistent  with a benign etiology such as endometrial atrophy. If bleeding remains unresponsive to hormonal or medical therapy, sonohysterogram should be considered for focal lesion work-up. (Ref: Radiological Reasoning: Algorithmic Workup of Abnormal Vaginal Bleeding with Endovaginal Sonography and Sonohysterography. AJR 2008; 161:W96-04) Electronically Signed   By: Signa Kell M.D.   On: 04/14/2015 15:22   US Pelvis Complete  04/14/2015  CLINICAL DATA:  Postmenopausal bleeding. EXAM: TRANSABDOMINAL AND TRANSVAGINAL ULTRASOUND OF PELVIS TECHNIQUE: Both transabdominal and transvaginal ultrasound examinations of the pelvis were performed. Transabdominal technique was performed for global imaging of the pelvis including uterus, ovaries, adnexal regions, and pelvic cul-de-sac. It was necessary to proceed with endovaginal exam following the transabdominal exam to visualize the uterus, endometrium and adnexa. COMPARISON:  None FINDINGS: Uterus Measurements: 5.8 x 2.4 x 3.6 cm. Calcifications noted peripherally. Endometrium Thickness: 2.6 mm.  No focal abnormality visualized. Right ovary Measurements: Not visualized. No adnexal mass noted. Left ovary Measurements: Not visualize. No adnexal mass noted. Other findings No abnormal free fluid. IMPRESSION: 1. In the setting of post-menopausal bleeding, this is consistent with a benign etiology such as endometrial atrophy. If bleeding remains unresponsive to hormonal or medical therapy, sonohysterogram should be considered for focal lesion work-up. (Ref: Radiological Reasoning: Algorithmic Workup of Abnormal Vaginal Bleeding with Endovaginal Sonography and Sonohysterography. AJR 2008; 540:J81-19) Electronically Signed   By: Signa Kell M.D.   On: 04/14/2015 15:22   I have personally reviewed and evaluated these images and lab results as part of my medical decision-making.   EKG Interpretation None      MDM   Final diagnoses:  Rectal bleeding    patient emergency  department reporting vaginal bleeding. Reports several episodes of "commode full of blood." Patient is on Coumadin. Was evaluated by [facility] 2 days ago, on their examination no evidence of bleeding from vaginal area. Patient unsure from where she is bleeding from. We'll check rectal exam. We'll get labs.  3:12 PM On rectal exam there was some dried up blood around the rectum, also pink blood on my finger. Suspicious for rectal bleeding. Given on Coumadin, will need admission for observation and further workup. I spoke with [facility] hospitalist, they will admit. [facility] did not repeat.   Filed Vitals:   04/16/15 1300 04/16/15 1345 04/16/15 1415 04/16/15 1445  BP: 102/49 122/64 116/71 134/62  Pulse: 82 78 83 100  Temp:      TempSrc:      Resp:      SpO2: 97% 98% 96% 97%     Jaynie Crumble, PA-C 04/16/15 1514  Rolan Bucco, MD 04/16/15 1534

## 2015-04-16 NOTE — ED Notes (Signed)
Pt presents via [facility] from [facility] for vaginal bleeding x1 week.  Pt was seen at [facility] Monday and d/c back to [facility].  Per staff bleeding is small amt.  Denies pain.  Pt a x 4, NAD.  Family at bedside.

## 2015-04-16 NOTE — ED Notes (Signed)
Pt cleaned of moderate amount of stool. 

## 2015-04-17 ENCOUNTER — Encounter (HOSPITAL_COMMUNITY): Payer: Self-pay

## 2015-04-17 ENCOUNTER — Encounter (HOSPITAL_COMMUNITY)
Admission: EM | Disposition: A | Discharge status: Skilled Nursing Facility | Payer: Self-pay | Source: Home / Self Care | Attending: Internal Medicine

## 2015-04-17 HISTORY — PX: FLEXIBLE SIGMOIDOSCOPY: SHX5431

## 2015-04-17 LAB — COMPREHENSIVE METABOLIC PANEL
ALT: 9 U/L — ABNORMAL LOW (ref 14–54)
AST: 17 U/L (ref 15–41)
Albumin: 2.8 g/dL — ABNORMAL LOW (ref 3.5–5.0)
Alkaline Phosphatase: 63 U/L (ref 38–126)
Anion gap: 10 (ref 5–15)
BUN: 20 mg/dL (ref 6–20)
CHLORIDE: 105 mmol/L (ref 101–111)
CO2: 28 mmol/L (ref 22–32)
Calcium: 8.6 mg/dL — ABNORMAL LOW (ref 8.9–10.3)
Creatinine, Ser: 1.6 mg/dL — ABNORMAL HIGH (ref 0.44–1.00)
GFR, EST AFRICAN AMERICAN: 35 mL/min — AB (ref >60)
GFR, EST NON AFRICAN AMERICAN: 30 mL/min — AB (ref >60)
Glucose, Bld: 93 mg/dL (ref 65–99)
POTASSIUM: 3.1 mmol/L — AB (ref 3.5–5.1)
SODIUM: 143 mmol/L (ref 135–145)
Total Bilirubin: 0.9 mg/dL (ref 0.3–1.2)
Total Protein: 7.3 g/dL (ref 6.5–8.1)

## 2015-04-17 LAB — CBC
HCT: 27.8 % — ABNORMAL LOW (ref 36.0–46.0)
Hemoglobin: 8.5 g/dL — ABNORMAL LOW (ref 12.0–15.0)
MCH: 28.1 pg (ref 26.0–34.0)
MCHC: 30.6 g/dL (ref 30.0–36.0)
MCV: 92.1 fL (ref 78.0–100.0)
PLATELETS: 200 10*3/uL (ref 150–400)
RBC: 3.02 MIL/uL — ABNORMAL LOW (ref 3.87–5.11)
RDW: 16 % — AB (ref 11.5–15.5)
WBC: 3.4 10*3/uL — AB (ref 4.0–10.5)

## 2015-04-17 LAB — MRSA PCR SCREENING: MRSA by PCR: POSITIVE — AB

## 2015-04-17 LAB — PROTIME-INR
INR: 1.58 — ABNORMAL HIGH (ref 0.00–1.49)
Prothrombin Time: 18.9 seconds — ABNORMAL HIGH (ref 11.6–15.2)

## 2015-04-17 LAB — HEPARIN LEVEL (UNFRACTIONATED): Heparin Unfractionated: 0.18 IU/mL — ABNORMAL LOW (ref 0.30–0.70)

## 2015-04-17 SURGERY — SIGMOIDOSCOPY, FLEXIBLE
Anesthesia: Moderate Sedation

## 2015-04-17 MED ORDER — LEVOTHYROXINE SODIUM 50 MCG PO TABS
50.0000 ug | ORAL_TABLET | Freq: Every day | ORAL | Status: DC
  Administered 2015-04-18 – 2015-04-22 (×5): 50 ug via ORAL
  Filled 2015-04-17 (×6): qty 1

## 2015-04-17 MED ORDER — SODIUM CHLORIDE 0.9 % IV SOLN: INTRAVENOUS | Status: DC

## 2015-04-17 MED ORDER — CHLORHEXIDINE GLUCONATE CLOTH 2 % EX PADS
6.0000 | MEDICATED_PAD | Freq: Every day | CUTANEOUS | Status: AC
  Administered 2015-04-17 – 2015-04-21 (×5): 6 via TOPICAL

## 2015-04-17 MED ORDER — FENTANYL CITRATE (PF) 100 MCG/2ML IJ SOLN
INTRAMUSCULAR | Status: AC
  Filled 2015-04-17: qty 2

## 2015-04-17 MED ORDER — POTASSIUM CHLORIDE CRYS ER 20 MEQ PO TBCR
40.0000 meq | EXTENDED_RELEASE_TABLET | ORAL | Status: AC
  Administered 2015-04-17 (×2): 40 meq via ORAL
  Filled 2015-04-17 (×2): qty 2

## 2015-04-17 MED ORDER — DULOXETINE HCL 20 MG PO CPEP
20.0000 mg | ORAL_CAPSULE | Freq: Every day | ORAL | Status: DC
  Administered 2015-04-18 – 2015-04-22 (×5): 20 mg via ORAL
  Filled 2015-04-17 (×9): qty 1

## 2015-04-17 MED ORDER — WARFARIN SODIUM 2 MG PO TABS
2.0000 mg | ORAL_TABLET | Freq: Once | ORAL | Status: DC
  Filled 2015-04-17: qty 1

## 2015-04-17 MED ORDER — METOPROLOL TARTRATE 12.5 MG HALF TABLET
12.5000 mg | ORAL_TABLET | Freq: Two times a day (BID) | ORAL | Status: DC
  Administered 2015-04-17 – 2015-04-22 (×11): 12.5 mg via ORAL
  Filled 2015-04-17 (×12): qty 1

## 2015-04-17 MED ORDER — MIDAZOLAM HCL 5 MG/ML IJ SOLN
INTRAMUSCULAR | Status: AC
  Filled 2015-04-17: qty 1

## 2015-04-17 MED ORDER — CEPHALEXIN 500 MG PO CAPS
500.0000 mg | ORAL_CAPSULE | Freq: Four times a day (QID) | ORAL | Status: DC
  Administered 2015-04-17 – 2015-04-22 (×20): 500 mg via ORAL
  Filled 2015-04-17 (×22): qty 1

## 2015-04-17 MED ORDER — WARFARIN - PHARMACIST DOSING INPATIENT
Freq: Every day | Status: DC
  Administered 2015-04-18 – 2015-04-20 (×3)

## 2015-04-17 MED ORDER — MUPIROCIN 2 % EX OINT
1.0000 "application " | TOPICAL_OINTMENT | Freq: Two times a day (BID) | CUTANEOUS | Status: AC
  Administered 2015-04-17 – 2015-04-21 (×9): 1 via NASAL
  Filled 2015-04-17 (×3): qty 22

## 2015-04-17 MED ORDER — FUROSEMIDE 80 MG PO TABS
160.0000 mg | ORAL_TABLET | Freq: Two times a day (BID) | ORAL | Status: DC
  Administered 2015-04-17 – 2015-04-22 (×10): 160 mg via ORAL
  Filled 2015-04-17 (×5): qty 2
  Filled 2015-04-17: qty 8
  Filled 2015-04-17 (×5): qty 2
  Filled 2015-04-17: qty 4

## 2015-04-17 MED ORDER — HEPARIN (PORCINE) IN NACL 100-0.45 UNIT/ML-% IJ SOLN
1250.0000 [IU]/h | INTRAMUSCULAR | Status: DC
  Administered 2015-04-17: 1000 [IU]/h via INTRAVENOUS
  Administered 2015-04-19 – 2015-04-22 (×5): 1250 [IU]/h via INTRAVENOUS
  Filled 2015-04-17 (×7): qty 250

## 2015-04-17 NOTE — Progress Notes (Signed)
Tap water enema given as ordered.  Pt. Tolerated well.  Will monitor for results.  Forbes Cellar, RN

## 2015-04-17 NOTE — Discharge Instructions (Signed)
Follow with Primary MD Glade Lloyd, MAHIMA, MD in 1-2 days   Get CBC, CMP, INR in 1-2 days at [facility].    Activity: As tolerated with Full fall precautions use walker/cane & assistance as needed   Disposition Home     Diet:   Heart Healthy   with feeding assistance and aspiration precautions.  For Heart failure patients - Check your Weight same time everyday, if you gain over 2 pounds, or you develop in leg swelling, experience more shortness of breath or chest pain, call your Primary MD immediately. Follow Cardiac Low Salt Diet and 1.5 lit/day fluid restriction.   On your next visit with your primary care physician please Get Medicines reviewed and adjusted.   Please request your Prim.MD to go over all Hospital Tests and Procedure/Radiological results at the follow up, please get all Hospital records sent to your Prim MD by signing hospital release before you go home.   If you experience worsening of your admission symptoms, develop shortness of breath, life threatening emergency, suicidal or homicidal thoughts you must seek medical attention immediately by calling 911 or calling your MD immediately  if symptoms less severe.  You Must read complete instructions/literature along with all the possible adverse reactions/side effects for all the Medicines you take and that have been prescribed to you. Take any new Medicines after you have completely understood and accpet all the possible adverse reactions/side effects.   Do not drive, operating heavy machinery, perform activities at heights, swimming or participation in water activities or provide baby sitting services if your were admitted for syncope or siezures until you have seen by Primary MD or a Neurologist and advised to do so again.  Do not drive when taking Pain medications.    Do not take more than prescribed Pain, Sleep and Anxiety Medications  Special Instructions: If you have smoked or chewed Tobacco  in the last 2 yrs please stop  smoking, stop any regular Alcohol  and or any Recreational drug use.  Wear Seat belts while driving.   Please note  You were cared for by a hospitalist during your hospital stay. If you have any questions about your discharge medications or the care you received while you were in the hospital after you are discharged, you can call the unit and asked to speak with the hospitalist on call if the hospitalist that took care of you is not available. Once you are discharged, your primary care physician will handle any further medical issues. Please note that NO REFILLS for any discharge medications will be authorized once you are discharged, as it is imperative that you return to your primary care physician (or establish a relationship with a primary care physician if you do not have one) for your aftercare needs so that they can reassess your need for medications and monitor your lab values.

## 2015-04-17 NOTE — Op Note (Signed)
[facility] [address] [place], [address]   FLEXIBLE SIGMOIDOSCOPY PROCEDURE REPORT  PATIENT: Margaret [name], Margaret [name]  MR#: [id number] BIRTHDATE: 06-19-1940 , 74  yrs. old GENDER: female ENDOSCOPIST: Carman Ching, MD REFERRED BY: [facility] hospitalist. Cardiologist Dr. Donnie Aho PROCEDURE DATE:  04/17/2015 PROCEDURE:   Sigmoidoscopy ASA CLASS:   class IV INDICATIONS: bleeding either from rectum or from vagina MEDICATIONS: done with no sedation.  DESCRIPTION OF PROCEDURE:   After the risks benefits and alternatives of the procedure were thoroughly explained, informed consent was obtained.  digital exam was normal      The [id number] ([id number])  endoscope was introduced through the anus  and advanced to the sigmoid colon 50 cm from the anus      , The exam was Without limitations.    The quality of the prep was fair. .patient had been prepped with tap water enemas . Estimated blood loss is zero unless otherwise noted in this procedure report. The instrument was then slowly withdrawn as the mucosa was fully examined. The scope was withdrawn in there was no bleeding seen it all. There was some solid stool seen throughout the: but no polyps, masses or signs of active or recent bleeding. Director is free of bleeding. The patient tolerated the procedure well and did not receive any sedation.       The scope was then withdrawn from the patient and the procedure terminated.  COMPLICATIONS: There were no immediate complications.  ENDOSCOPIC IMPRESSION: 1. No gross bleeding from the G.I. tract  RECOMMENDATIONS: would continue all of her current medications and treatments. We'll be happy to see her back again as needed.  REPEAT EXAM:  eSigned:  Carman Ching, MD 04/17/2015 10:16 AM   CC:Dr. Severiano Gilbert, Dr. Ree Kida

## 2015-04-17 NOTE — Progress Notes (Signed)
ANTICOAGULATION CONSULT NOTE  Pharmacy Consult for heparin and coumadin Indication: atrial fibrillation  No Known Allergies  Patient Measurements:   Heparin Dosing Weight: 77 kg  Vital Signs: Temp: 97.1 F (36.2 C) (12/23 1404) Temp Source: Oral (12/23 1404) BP: 125/65 mmHg (12/23 1404) Pulse Rate: 95 (12/23 1404)  Labs:  Recent Labs  04/16/15 1330 04/16/15 1334 04/16/15 1720 04/17/15 0547 04/17/15 2050  HGB 8.9* 10.2* 9.1* 8.5*  --   HCT 29.1* 30.0* 31.1* 27.8*  --   PLT 201  --  225 200  --   LABPROT 18.4*  --   --  18.9*  --   INR 1.52*  --   --  1.58*  --   HEPARINUNFRC  --   --   --   --  0.18*  CREATININE  --  1.80*  --  1.60*  --     CrCl cannot be calculated (Unknown ideal weight.).   Medical History: Past Medical History  Diagnosis Date  . NICM (nonischemic cardiomyopathy) (HCC)     EF 35%; cath 2/12 no CAD  . Systolic CHF, chronic (HCC)   . Severe mitral regurgitation     s/p complex mitral valve repair with cox maze + LAA clipping, removal of RV lead, and implantation of CRT-D in abdomen  . Cardiac arrest - ventricular fibrillation     in 1990s  . Atrial fibrillation (HCC)     amiodarone  . HTN (hypertension)   . HLD (hyperlipidemia)   . Achalasia     s/p dilation  . Recurrent UTI   . Obesity   . Anoxic brain injury (HCC) 1996    s/p cardiac arrest; "in a coma for 16 days"  . Automatic implantable cardioverter-defibrillator in situ   . Hypothyroidism   . TIA (transient ischemic attack)   . Chronic kidney disease (CKD), stage IV (severe) (HCC)     [name] 10/03/2013    Medications:  Scheduled:  . cephALEXin  500 mg Oral 4 times per day  . Chlorhexidine Gluconate Cloth  6 each Topical Q0600  . DULoxetine  20 mg Oral Daily  . furosemide  160 mg Oral BID  . [START ON 04/18/2015] levothyroxine  50 mcg Oral QAC breakfast  . metoprolol tartrate  12.5 mg Oral BID  . mupirocin ointment  1 application Nasal BID  . pantoprazole (PROTONIX) IV   40 mg Intravenous Q24H  . sodium chloride  3 mL Intravenous Q12H  . warfarin  2 mg Oral ONCE-1800  . Warfarin - Pharmacist Dosing Inpatient   Does not apply q1800   Infusions:  . heparin 1,000 Units/hr (04/17/15 1309)    Assessment: 74 yo female with afib will be started back on coumadin while bridging with heparin (no bolus).  CHADVs score is 6.  Patient came in with rectal bleeding which team thinks could be diverticular vs internal hemorrhoids.  Seen by GI and underwent flexible sigmoidoscopy on 04/17/2015 with no evidence of active bleed.  Prior admission coumadin dose is 2 mg po daily (last dose was 04/15/15).  INR 1.58, Hgb 8.5 and Plt 200 K today.  Initial HL subtherapeutic (0.18) on 1000 units/h. No bleed/IV line issues per RN.  Goal of Therapy:  Heparin level 0.3-0.7 units/ml; INR 2-3 Monitor platelets by anticoagulation protocol: Yes   Plan:  - Increase Heparin to 1250 units/h - coumadin 2 mg po x1 not given - start tomorrow per MD note - 8hr heparin level - daily heparin level, CBC and  INR - Mon s/sx bleeding  Babs Bertin, PharmD, [facility] Clinical Pharmacist Pager [phone] 04/17/2015 9:16 PM

## 2015-04-17 NOTE — Progress Notes (Addendum)
Patient Demographics:    Margaret [name], is a 74 y.o. female, DOB - 03-27-1941, WNI:[id number]  Admit date - 04/16/2015   Admitting Physician Eddie North, MD  Outpatient Primary MD for the patient is [name], MD  LOS - 1   Chief Complaint  Patient presents with  . Vaginal Bleeding        Subjective:    Margaret [name] today has, No headache, No chest pain, No abdominal pain - No Nausea, No new weakness tingling or numbness, No Cough - SOB.     Assessment  & Plan :      1. Rectal bleeding. Could be diverticular versus internal hemorrhoids. Was on Coumadin with therapeutic INR, quit in on hold INR around 1.5, no evidence of ongoing bleed, H&H stable with minor blood loss related drop, no need for transfusion. Seen by GI and underwent flexible sigmoidoscopy on 04/17/2015 with no evidence of active bleed. We'll place her back on a liquid diet, if no further bleed resume Coumadin with Heparin, cautious INR and H&H monitoring. Likely discharge in the morning if stable. D/W Dr Randa Evens.   Note patient was evaluated for possible vaginal bleeding at [facility] extensively prior to admission at [facility] and vaginal bleeding was ruled out at [facility].   2. Cellulitis of the right leg. Appears stable currently on Ancef continued.   3. Nonischemic cardiomyopathy with chronic systolic heart failure last EF 30-35% with AICD in place. 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 Currently compensated. Stop IV fluids, clear liquid diet, resume home dose beta blocker and Lasix.    4. Chronic indwelling Foley catheter. No signs of bleeding.   5. Chronic atrial fibrillation [place] Vasco score of 6. Goal will be rate controlled, continue beta blocker, resume Coumadin tomorrow, if stable  will start heparin drip later today.   6. Hypothyroidism. Currently on Synthroid.   7. Hypokalemia. Replaced   8. Mild dementia. At risk for delirium, minimize narcotics and benzodiazepines.     Code Status : DNR  Family Communication  : none present  Disposition Plan  : SNF in am  Consults  :  GI  Procedures  :   Flexible sigmoidoscopy with no evidence of bleeding  DVT Prophylaxis  :   SCDs    Lab Results  Component Value Date   PLT 200 04/17/2015    Inpatient Medications  Scheduled Meds: . [MAR Hold]  ceFAZolin (ANCEF) IV  1 g Intravenous 3 times per day  . [MAR Hold] Chlorhexidine Gluconate Cloth  6 each Topical Q0600  . [MAR Hold] levothyroxine  25 mcg Intravenous Daily  . [MAR Hold] mupirocin ointment  1 application Nasal BID  . [MAR Hold] pantoprazole (PROTONIX) IV  40 mg Intravenous Q24H  . [MAR Hold] sodium chloride  3 mL Intravenous Q12H   Continuous Infusions:  PRN Meds:.[MAR Hold] acetaminophen **OR** [MAR Hold] acetaminophen  Antibiotics  :    Anti-infectives    Start     Dose/Rate Route Frequency Ordered Stop   04/16/15 1800  [MAR Hold]  ceFAZolin (ANCEF) IVPB 1 g/50 mL premix     (MAR Hold since 04/17/15 0931)   1 g 100 mL/hr over 30 Minutes Intravenous 3 times per  day 04/16/15 1625     04/16/15 1600  ceFAZolin (ANCEF) IVPB 1 g/50 mL premix  Status:  Discontinued     1 g 100 mL/hr over 30 Minutes Intravenous 3 times per day 04/16/15 1551 04/16/15 1625        Objective:   Filed Vitals:   04/17/15 0518 04/17/15 0946 04/17/15 0955 04/17/15 1000  BP: 126/59 154/76 154/80 154/85  Pulse: 93 99 99 100  Temp: 98.2 F (36.8 C)     TempSrc: Oral     Resp: SpO2: 92% 100% 96% 99%    Wt Readings from Last 3 Encounters:  03/27/15 90.175 kg (198 lb 12.8 oz)  03/17/15 93.622 kg (206 lb 6.4 oz)  12/30/14 95.709 kg (211 lb)     Intake/Output Summary (Last 24 hours) at 04/17/15 1036 Last data filed at 04/17/15 0646  Gross per 24  hour  Intake    770 ml  Output   1700 ml  Net   -930 ml     Physical Exam  Awake , mildly confused, No new F.N deficits, Normal affect [place].AT,PERRAL Supple Neck,No JVD, No cervical lymphadenopathy appriciated.  Symmetrical Chest wall movement, Good air movement bilaterally, CTAB RRR,No Gallops,Rubs or new Murmurs, No Parasternal Heave +ve B.Sounds, Abd Soft, No tenderness, No organomegaly appriciated, No rebound - guarding or rigidity. No Cyanosis, Clubbing or edema, No new Rash or bruise       Data Review:   Micro Results Recent Results (from the past 240 hour(s))  Wet prep, genital     Status: Abnormal   Collection Time: 04/14/15  4:05 PM  Result Value Ref Range Status   Yeast Wet Prep HPF POC NONE SEEN NONE SEEN Final   Trich, Wet Prep NONE SEEN NONE SEEN Final   Clue Cells Wet Prep HPF POC NONE SEEN NONE SEEN Final   WBC, Wet Prep HPF POC FEW (A) NONE SEEN Final    Comment: FEW BACTERIA SEEN   Sperm NONE SEEN  Final  MRSA PCR Screening     Status: Abnormal   Collection Time: 04/17/15  3:20 AM  Result Value Ref Range Status   MRSA by PCR POSITIVE (A) NEGATIVE Final    Comment:        The GeneXpert MRSA Assay (FDA approved for NASAL specimens only), is one component of a comprehensive MRSA colonization surveillance program. It is not intended to diagnose MRSA infection nor to guide or monitor treatment for MRSA infections. RESULT CALLED TO, READ BACK BY AND VERIFIED WITH: KRISTEN  04/17/15 [facility]     Radiology Reports US Transvaginal Non-ob  04/14/2015  CLINICAL DATA:  Postmenopausal bleeding. EXAM: TRANSABDOMINAL AND TRANSVAGINAL ULTRASOUND OF PELVIS TECHNIQUE: Both transabdominal and transvaginal ultrasound examinations of the pelvis were performed. Transabdominal technique was performed for global imaging of the pelvis including uterus, ovaries, adnexal regions, and pelvic cul-de-sac. It was necessary to proceed with endovaginal exam following the  transabdominal exam to visualize the uterus, endometrium and adnexa. COMPARISON:  None FINDINGS: Uterus Measurements: 5.8 x 2.4 x 3.6 cm. Calcifications noted peripherally. Endometrium Thickness: 2.6 mm.  No focal abnormality visualized. Right ovary Measurements: Not visualized. No adnexal mass noted. Left ovary Measurements: Not visualize. No adnexal mass noted. Other findings No abnormal free fluid. IMPRESSION: 1. In the setting of post-menopausal bleeding, this is consistent with a benign etiology such as endometrial atrophy. If bleeding remains unresponsive to hormonal or medical therapy, sonohysterogram should be considered for focal lesion work-up. (  Ref: Radiological Reasoning: Algorithmic Workup of Abnormal Vaginal Bleeding with Endovaginal Sonography and Sonohysterography. AJR 2008; 696:E95-28) Electronically Signed   By: Signa Kell M.D.   On: 04/14/2015 15:22   US Pelvis Complete  04/14/2015  CLINICAL DATA:  Postmenopausal bleeding. EXAM: TRANSABDOMINAL AND TRANSVAGINAL ULTRASOUND OF PELVIS TECHNIQUE: Both transabdominal and transvaginal ultrasound examinations of the pelvis were performed. Transabdominal technique was performed for global imaging of the pelvis including uterus, ovaries, adnexal regions, and pelvic cul-de-sac. It was necessary to proceed with endovaginal exam following the transabdominal exam to visualize the uterus, endometrium and adnexa. COMPARISON:  None FINDINGS: Uterus Measurements: 5.8 x 2.4 x 3.6 cm. Calcifications noted peripherally. Endometrium Thickness: 2.6 mm.  No focal abnormality visualized. Right ovary Measurements: Not visualized. No adnexal mass noted. Left ovary Measurements: Not visualize. No adnexal mass noted. Other findings No abnormal free fluid. IMPRESSION: 1. In the setting of post-menopausal bleeding, this is consistent with a benign etiology such as endometrial atrophy. If bleeding remains unresponsive to hormonal or medical therapy, sonohysterogram  should be considered for focal lesion work-up. (Ref: Radiological Reasoning: Algorithmic Workup of Abnormal Vaginal Bleeding with Endovaginal Sonography and Sonohysterography. AJR 2008; 413:K44-01) Electronically Signed   By: Signa Kell M.D.   On: 04/14/2015 15:22     CBC  Recent Labs Lab 04/14/15 1422 04/16/15 1330 04/16/15 1334 04/16/15 1720 04/17/15 0547  WBC 5.4 4.2  --  4.3 3.4*  HGB 9.7* 8.9* 10.2* 9.1* 8.5*  HCT 32.4* 29.1* 30.0* 31.1* 27.8*  PLT 248 201  --  225 200  MCV 91.0 92.4  --  92.0 92.1  MCH 27.2 28.3  --  26.9 28.1  MCHC 29.9* 30.6  --  29.3* 30.6  RDW 16.0* 16.0*  --  16.0* 16.0*  LYMPHSABS  --  1.0  --   --   --   MONOABS  --  0.3  --   --   --   EOSABS  --  0.3  --   --   --   BASOSABS  --  0.0  --   --   --     Chemistries   Recent Labs Lab 04/16/15 1334 04/17/15 0547  NA 141 143  K 3.8 3.1*  CL 102 105  CO2  --  28  GLUCOSE 101* 93  BUN 28* 20  CREATININE 1.80* 1.60*  CALCIUM  --  8.6*  AST  --  17  ALT  --  9*  ALKPHOS  --  63  BILITOT  --  0.9   ------------------------------------------------------------------------------------------------------------------ CrCl cannot be calculated (Unknown ideal weight.). ------------------------------------------------------------------------------------------------------------------ No results for input(s): HGBA1C in the last 72 hours. ------------------------------------------------------------------------------------------------------------------ No results for input(s): CHOL, HDL, LDLCALC, TRIG, CHOLHDL, LDLDIRECT in the last 72 hours. ------------------------------------------------------------------------------------------------------------------ No results for input(s): TSH, T4TOTAL, T3FREE, THYROIDAB in the last 72 hours.  Invalid input(s): FREET3 ------------------------------------------------------------------------------------------------------------------ No results for input(s):  VITAMINB12, FOLATE, FERRITIN, TIBC, IRON, RETICCTPCT in the last 72 hours.  Coagulation profile  Recent Labs Lab 04/16/15 1330 04/17/15 0547  INR 1.52* 1.58*    No results for input(s): DDIMER in the last 72 hours.  Cardiac Enzymes No results for input(s): CKMB, TROPONINI, MYOGLOBIN in the last 168 hours.  Invalid input(s): CK ------------------------------------------------------------------------------------------------------------------ Invalid input(s): POCBNP   Time Spent in minutes  35   Aryaman Haliburton K M.D on 04/17/2015 at 10:36 AM  Between 7am to 7pm - Pager - [phone]  After 7pm go to [email or web address] - password [email or web address]  [facility] -  Office  [phone]

## 2015-04-17 NOTE — Progress Notes (Signed)
Abn UA -concerning for UTI so ck urine cx -on Ancef for cellulitis -pt with h/o recurrent UTI -last UTI 11/2014 was MDR E Coli  Junious Silk, ANP

## 2015-04-17 NOTE — Care Management Note (Addendum)
Case Management Note  Patient Details  Name: Margaret [name] MRN: [id number] Date of Birth: 07-09-1940  Subjective/Objective:     Patient was for dc today, now  Patient started on heparin gtt, will need pt/inr follow up by pcp per MD note.              Action/Plan:   Expected Discharge Date:                  Expected Discharge Plan:  Home/Self Care  In-House Referral:     Discharge planning Services  CM Consult  Post Acute Care Choice:    Choice offered to:     DME Arranged:    DME Agency:     HH Arranged:    HH Agency:     Status of Service:  Completed, signed off  Medicare Important Message Given:    Date Medicare IM Given:    Medicare IM give by:    Date Additional Medicare IM Given:    Additional Medicare Important Message give by:     If discussed at Long Length of Stay Meetings, dates discussed:    Additional Comments:  Leone Haven, RN 04/17/2015, 3:41 PM

## 2015-04-17 NOTE — Progress Notes (Signed)
Lab called RN with report of a positive MRSA PCR. Paged on-call MD, [place], with results. Will continue to treat and monitor per MD orders.

## 2015-04-17 NOTE — Discharge Summary (Signed)
Margaret [name], is a 74 y.o. female  DOB [birth year]  MRN [id number].  Admission date:  04/16/2015  Admitting Physician  Eddie North, MD  Discharge Date:  04/17/2015   Primary MD  [name], MD  Recommendations for primary care physician for things to follow:   Check INR, CBC and BMP in 2-3 days.   Admission Diagnosis  Rectal bleeding [K62.5]   Discharge Diagnosis  Rectal bleeding [K62.5]     Principal Problem:   Rectal bleeding Active Problems:   Automatic implantable cardioverter-defibrillator in situ   CKD (chronic kidney disease), stage III   Hypertension   Hyperlipidemia   Hypothyroidism   Cellulitis of right leg   Overactive bladder/chronic foley   Nonischemic cardiomyopathy (HCC)   Chronic atrial fibrillation [facility])      Past Medical History  Diagnosis Date  . NICM (nonischemic cardiomyopathy) (HCC)     EF 35%; cath 2/12 no CAD  . Systolic CHF, chronic (HCC)   . Severe mitral regurgitation     s/p complex mitral valve repair with cox maze + LAA clipping, removal of RV lead, and implantation of CRT-D in abdomen  . Cardiac arrest - ventricular fibrillation     in 1990s  . Atrial fibrillation (HCC)     amiodarone  . HTN (hypertension)   . HLD (hyperlipidemia)   . Achalasia     s/p dilation  . Recurrent UTI   . Obesity   . Anoxic brain injury (HCC) 1996    s/p cardiac arrest; "in a coma for 16 days"  . Automatic implantable cardioverter-defibrillator in situ   . Hypothyroidism   . TIA (transient ischemic attack)   . Chronic kidney disease (CKD), stage IV (severe) (HCC)     [name] 10/03/2013    Past Surgical History  Procedure Laterality Date  . Defibrillator generator explantation and reimplantation; and  08/02/2001  . Device migration with anticipated pocket revision   10/29/2003  . 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 Attempted implantation of an implantable cardioverter-  12/30/2004  . Cardioversion  04/14/2010  . Median sternotomy  07/16/2010  . Mitral valve repair  07/16/2010  . Cox maze procedure (complete biatrial lesion set with clipping of  07/16/2010  . Removal of old endocardial right ventricular defibrillator lead.  07/16/2010  . Placement of dual chamber pacemaker and implantable cardiac  07/16/2010  . Placement of swan ganz pulmonary artery catheter via left femoral access.  07/16/2010  . Laparoscopic cholecystectomy    . Cataract extraction, bilateral    . Cystoscopy w/ stone manipulation    . Cataract extraction, bilateral Bilateral        HPI  from the history and physical done on the day of admission:   74 year old female resident of [name] nursing facility who was sent to the ER secondary to complaints of vaginal bleeding. He should have been evaluated at [facility] in MAU on 12/20 for same problem with the transvaginal ultrasound revealing no  significant abnormalities. Of note patient has a chronic Foley catheter. Of note the provider at [facility] unable to clearly identify any vaginal bleeding and concerns were the patient was bleeding from the urethra at her catheter site. She was subsequently returned back to the nursing facility. She was sent back to the ER today with reports again of vaginal bleeding. Patient reports large volume blood after using the commode today. She reports the bleeding symptoms as described above have been ongoing for one week.   She denies constipation. Denies NSAIDs as she has not started any new medications. She reports that her cardiologist has stated she is medically unstable from a cardiac standpoint to tolerate a colonoscopy. She reports she was recently started on an antibiotic for right lower extremity cellulitis and has been taking this for 2  days. She denies diarrhea.    Hospital Course:     1. Rectal bleeding. Could be diverticular versus internal hemorrhoids. Was on Coumadin with therapeutic INR,  no evidence of ongoing bleed, H&H stable with minor blood loss related drop, no need for transfusion. Seen by GI and underwent flexible sigmoidoscopy on 04/17/2015 with no evidence of active bleed. We'll place her back on a liquid diet, advance as tolerated, stress with GI physician Dr. Randa Evens, we resumed Coumadin with Heparin without bolus make sure she can tolerate full therapeutic levels of anticoagulation without bleeding, cautious INR and H&H monitoring.  Will continue home dose Coumadin upon discharge with the goal of gently bringing INR 2 to in the next 24-48 hours.  please monitor H&H and INR levels closely at SNF.  Note patient was evaluated for possible vaginal bleeding at [facility] extensively prior to admission at [facility] and vaginal bleeding was ruled out at [facility].   2. Cellulitis of the right leg. Appears stable currently on Keflex continue.    3. Nonischemic cardiomyopathy with chronic systolic heart failure last EF 30-35% with AICD in place. 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 Currently compensated. Stopped IV fluids, resumed home dose beta blocker and Lasix.    4. Chronic indwelling Foley catheter. No signs of bleeding.   5. Chronic atrial fibrillation [place] Vasco score of 6. Goal will be rate controlled, continue beta blocker, resume Coumadin tomorrow, if stable will start heparin drip later today.   6. Hypothyroidism. Currently on Synthroid.   7. Hypokalemia. Replaced   8. Mild dementia. At risk for delirium, minimize narcotics and benzodiazepines.    Discharge Condition: Stable  Follow UP  Follow-up Information    Follow up with [name], MD. Schedule an appointment as soon as possible for a visit in 1 week.   Specialty:  Internal Medicine   Contact  information:   [address] [place] [address] [phone]        Consults obtained - GI  Diet and Activity recommendation: See Discharge Instructions below  Discharge Instructions     Discharge Instructions    Discharge instructions    Complete by:  As directed   Follow with Primary MD [name], MD in 1-2 days   Get CBC, CMP, INR in 1-2 days at [facility].    Activity: As tolerated with Full fall precautions use walker/cane & assistance as needed   Disposition Home     Diet:   Heart Healthy   with feeding assistance and aspiration precautions.  For Heart failure patients - Check your Weight same time everyday, if you gain over 2 pounds, or  you develop in leg swelling, experience more shortness of breath or chest pain, call your Primary MD immediately. Follow Cardiac Low Salt Diet and 1.5 lit/day fluid restriction.   On your next visit with your primary care physician please Get Medicines reviewed and adjusted.   Please request your Prim.MD to go over all Hospital Tests and Procedure/Radiological results at the follow up, please get all Hospital records sent to your Prim MD by signing hospital release before you go home.   If you experience worsening of your admission symptoms, develop shortness of breath, life threatening emergency, suicidal or homicidal thoughts you must seek medical attention immediately by calling 911 or calling your MD immediately  if symptoms less severe.  You Must read complete instructions/literature along with all the possible adverse reactions/side effects for all the Medicines you take and that have been prescribed to you. Take any new Medicines after you have completely understood and accpet all the possible adverse reactions/side effects.   Do not drive, operating heavy machinery, perform activities at heights, swimming or participation in water activities or provide baby sitting services if your were admitted for syncope or siezures  until you have seen by Primary MD or a Neurologist and advised to do so again.  Do not drive when taking Pain medications.    Do not take more than prescribed Pain, Sleep and Anxiety Medications  Special Instructions: If you have smoked or chewed Tobacco  in the last 2 yrs please stop smoking, stop any regular Alcohol  and or any Recreational drug use.  Wear Seat belts while driving.   Please note  You were cared for by a hospitalist during your hospital stay. If you have any questions about your discharge medications or the care you received while you were in the hospital after you are discharged, you can call the unit and asked to speak with the hospitalist on call if the hospitalist that took care of you is not available. Once you are discharged, your primary care physician will handle any further medical issues. Please note that NO REFILLS for any discharge medications will be authorized once you are discharged, as it is imperative that you return to your primary care physician (or establish a relationship with a primary care physician if you do not have one) for your aftercare needs so that they can reassess your need for medications and monitor your lab values.     Increase activity slowly    Complete by:  As directed              Discharge Medications       Medication List    TAKE these medications        acetaminophen 500 MG tablet  Commonly known as:  TYLENOL  Take 500 mg by mouth at bedtime.     cephALEXin 500 MG capsule  Commonly known as:  KEFLEX  Take 500 mg by mouth 4 (four) times daily.     DECUBI-VITE PO  Take by mouth daily. For wound healing     DULoxetine 20 MG capsule  Commonly known as:  CYMBALTA  Take 20 mg by mouth daily. For depression     fluticasone 50 MCG/ACT nasal spray  Commonly known as:  FLONASE  Place 2 sprays into both nostrils at bedtime. For allergies     folic acid 1 MG tablet  Commonly known as:  FOLVITE  Take 1 tablet by mouth  daily.     furosemide 80 MG tablet  Commonly known as:  LASIX  Take 160 mg by mouth 2 (two) times daily. Take 2 tabs = 160 mg PO BID     levothyroxine 50 MCG tablet  Commonly known as:  SYNTHROID, LEVOTHROID  Take 50 mcg by mouth daily before breakfast.     loratadine 10 MG tablet  Commonly known as:  CLARITIN  Take 10 mg by mouth daily.     Melatonin 5 MG Tabs  Take 5 mg by mouth at bedtime.     metoprolol tartrate 25 MG tablet  Commonly known as:  LOPRESSOR  Take 12.5 mg by mouth 2 (two) times daily.     MIRALAX PO  Take 17 g by mouth 2 (two) times daily as needed.     MYRBETRIQ 50 MG Tb24 tablet  Generic drug:  mirabegron ER  Take 50 mg by mouth daily.     potassium chloride SA 20 MEQ tablet  Commonly known as:  K-DUR,KLOR-CON  Take 40 mEq by mouth 2 (two) times daily.     PROCEL 100 PO  Take 2 scoop by mouth 2 (two) times daily.     saccharomyces boulardii 250 MG capsule  Commonly known as:  FLORASTOR  Take 250 mg by mouth 2 (two) times daily.     senna-docusate 8.6-50 MG tablet  Commonly known as:  Senokot-S  Take 1 tablet by mouth at bedtime as needed for mild constipation.     warfarin 2 MG tablet  Commonly known as:  COUMADIN  Take 2 mg by mouth daily.        Major procedures and Radiology Reports - PLEASE review detailed and final reports for all details, in brief -   Flex - Sig - no active bleed   US Transvaginal Non-ob  04/14/2015  CLINICAL DATA:  Postmenopausal bleeding. EXAM: TRANSABDOMINAL AND TRANSVAGINAL ULTRASOUND OF PELVIS TECHNIQUE: Both transabdominal and transvaginal ultrasound examinations of the pelvis were performed. Transabdominal technique was performed for global imaging of the pelvis including uterus, ovaries, adnexal regions, and pelvic cul-de-sac. It was necessary to proceed with endovaginal exam following the transabdominal exam to visualize the uterus, endometrium and adnexa. COMPARISON:  None FINDINGS: Uterus Measurements: 5.8  x 2.4 x 3.6 cm. Calcifications noted peripherally. Endometrium Thickness: 2.6 mm.  No focal abnormality visualized. Right ovary Measurements: Not visualized. No adnexal mass noted. Left ovary Measurements: Not visualize. No adnexal mass noted. Other findings No abnormal free fluid. IMPRESSION: 1. In the setting of post-menopausal bleeding, this is consistent with a benign etiology such as endometrial atrophy. If bleeding remains unresponsive to hormonal or medical therapy, sonohysterogram should be considered for focal lesion work-up. (Ref: Radiological Reasoning: Algorithmic Workup of Abnormal Vaginal Bleeding with Endovaginal Sonography and Sonohysterography. AJR 2008; 130:Q65-78) Electronically Signed   By: Signa Kell M.D.   On: 04/14/2015 15:22   US Pelvis Complete  04/14/2015  CLINICAL DATA:  Postmenopausal bleeding. EXAM: TRANSABDOMINAL AND TRANSVAGINAL ULTRASOUND OF PELVIS TECHNIQUE: Both transabdominal and transvaginal ultrasound examinations of the pelvis were performed. Transabdominal technique was performed for global imaging of the pelvis including uterus, ovaries, adnexal regions, and pelvic cul-de-sac. It was necessary to proceed with endovaginal exam following the transabdominal exam to visualize the uterus, endometrium and adnexa. COMPARISON:  None FINDINGS: Uterus Measurements: 5.8 x 2.4 x 3.6 cm. Calcifications noted peripherally. Endometrium Thickness: 2.6 mm.  No focal abnormality visualized. Right ovary Measurements: Not visualized. No adnexal mass noted. Left ovary Measurements: Not visualize. No adnexal mass noted. Other findings No abnormal free  fluid. IMPRESSION: 1. In the setting of post-menopausal bleeding, this is consistent with a benign etiology such as endometrial atrophy. If bleeding remains unresponsive to hormonal or medical therapy, sonohysterogram should be considered for focal lesion work-up. (Ref: Radiological Reasoning: Algorithmic Workup of Abnormal Vaginal Bleeding  with Endovaginal Sonography and Sonohysterography. AJR 2008; 161:W96-04) Electronically Signed   By: Signa Kell M.D.   On: 04/14/2015 15:22    Micro Results      Recent Results (from the past 240 hour(s))  Wet prep, genital     Status: Abnormal   Collection Time: 04/14/15  4:05 PM  Result Value Ref Range Status   Yeast Wet Prep HPF POC NONE SEEN NONE SEEN Final   Trich, Wet Prep NONE SEEN NONE SEEN Final   Clue Cells Wet Prep HPF POC NONE SEEN NONE SEEN Final   WBC, Wet Prep HPF POC FEW (A) NONE SEEN Final    Comment: FEW BACTERIA SEEN   Sperm NONE SEEN  Final  MRSA PCR Screening     Status: Abnormal   Collection Time: 04/17/15  3:20 AM  Result Value Ref Range Status   MRSA by PCR POSITIVE (A) NEGATIVE Final    Comment:        The GeneXpert MRSA Assay (FDA approved for NASAL specimens only), is one component of a comprehensive MRSA colonization surveillance program. It is not intended to diagnose MRSA infection nor to guide or monitor treatment for MRSA infections. RESULT CALLED TO, READ BACK BY AND VERIFIED WITH: KRISTEN  04/17/15 MKELLY        Today   Subjective    Margaret [name] today has no headache,no chest abdominal pain,no new weakness tingling or numbness, feels much better.    Objective   Blood pressure 125/65, pulse 95, temperature 97.1 F (36.2 C), temperature source Oral, resp. rate 20, SpO2 98 %.   Intake/Output Summary (Last 24 hours) at 04/17/15 1409 Last data filed at 04/17/15 1300  Gross per 24 hour  Intake    890 ml  Output   1700 ml  Net   -810 ml    Exam Awake Alert, Oriented x 3, No new F.N deficits, Normal affect [place].AT,PERRAL Supple Neck,No JVD, No cervical lymphadenopathy appriciated.  Symmetrical Chest wall movement, Good air movement bilaterally, CTAB RRR,No Gallops,Rubs or new Murmurs, No Parasternal Heave +ve B.Sounds, Abd Soft, Non tender, No organomegaly appriciated, No rebound -guarding or rigidity. No Cyanosis,  Clubbing or edema, No new Rash or bruise   Data Review   CBC w Diff: Lab Results  Component Value Date   WBC 3.4* 04/17/2015   WBC 4.3 11/07/2014   HGB 8.5* 04/17/2015   HCT 27.8* 04/17/2015   HCT 33.5* 07/18/2014   PLT 200 04/17/2015   LYMPHOPCT 24 04/16/2015   MONOPCT 7 04/16/2015   EOSPCT 6 04/16/2015   BASOPCT 1 04/16/2015    CMP: Lab Results  Component Value Date   NA 143 04/17/2015   NA 143 11/07/2014   K 3.1* 04/17/2015   CL 105 04/17/2015   CO2 28 04/17/2015   BUN 20 04/17/2015   BUN 34* 11/07/2014   CREATININE 1.60* 04/17/2015   CREATININE 1.9* 11/07/2014   GLU 96 11/07/2014   PROT 7.3 04/17/2015   ALBUMIN 2.8* 04/17/2015   BILITOT 0.9 04/17/2015   ALKPHOS 63 04/17/2015   AST 17 04/17/2015   ALT 9* 04/17/2015   Lab Results  Component Value Date   INR 1.58* 04/17/2015   INR 1.52* 04/16/2015  INR 3.4* 03/20/2015     Total Time in preparing paper work, data evaluation and todays exam - 35 minutes  Leroy Sea M.D on 04/17/2015 at 2:09 PM  [facility]   Office  [phone]

## 2015-04-17 NOTE — Progress Notes (Signed)
ANTICOAGULATION CONSULT NOTE - Initial Consult  Pharmacy Consult for heparin and coumadin Indication: atrial fibrillation  No Known Allergies  Patient Measurements:   Heparin Dosing Weight: 77 kg  Vital Signs: Temp: 98.2 F (36.8 C) (12/23 0518) Temp Source: Oral (12/23 0518) BP: 154/85 mmHg (12/23 1000) Pulse Rate: 100 (12/23 1000)  Labs:  Recent Labs  04/16/15 1330 04/16/15 1334 04/16/15 1720 04/17/15 0547  HGB 8.9* 10.2* 9.1* 8.5*  HCT 29.1* 30.0* 31.1* 27.8*  PLT 201  --  225 200  LABPROT 18.4*  --   --  18.9*  INR 1.52*  --   --  1.58*  CREATININE  --  1.80*  --  1.60*    CrCl cannot be calculated (Unknown ideal weight.).   Medical History: Past Medical History  Diagnosis Date  . NICM (nonischemic cardiomyopathy) (HCC)     EF 35%; cath 2/12 no CAD  . Systolic CHF, chronic (HCC)   . Severe mitral regurgitation     s/p complex mitral valve repair with cox maze + LAA clipping, removal of RV lead, and implantation of CRT-D in abdomen  . Cardiac arrest - ventricular fibrillation     in 1990s  . Atrial fibrillation (HCC)     amiodarone  . HTN (hypertension)   . HLD (hyperlipidemia)   . Achalasia     s/p dilation  . Recurrent UTI   . Obesity   . Anoxic brain injury (HCC) 1996    s/p cardiac arrest; "in a coma for 16 days"  . Automatic implantable cardioverter-defibrillator in situ   . Hypothyroidism   . TIA (transient ischemic attack)   . Chronic kidney disease (CKD), stage IV (severe) (HCC)     [name] 10/03/2013    Medications:  Scheduled:  .  ceFAZolin (ANCEF) IV  1 g Intravenous 3 times per day  . Chlorhexidine Gluconate Cloth  6 each Topical Q0600  . DULoxetine  20 mg Oral Daily  . furosemide  160 mg Oral BID  . [START ON 04/18/2015] levothyroxine  50 mcg Oral QAC breakfast  . metoprolol tartrate  12.5 mg Oral BID  . mupirocin ointment  1 application Nasal BID  . pantoprazole (PROTONIX) IV  40 mg Intravenous Q24H  . potassium chloride  40 mEq  Oral Q4H  . sodium chloride  3 mL Intravenous Q12H   Infusions:    Assessment: 74 yo female with afib will be started back on coumadin while bridging with heparin (no bolus).  CHADVs score is 6.  Patient came in with rectal bleeding which team thinks could be diverticular vs internal hemorrhoids.  Seen by GI and underwent flexible sigmoidoscopy on 04/17/2015 with no evidence of active bleed.  Prior admission coumadin dose is 2 mg po daily (last dose was 04/15/15).  INR 1.58, Hgb 8.5 and Plt 200 K today  Goal of Therapy:  Heparin level 0.3-0.7 units/ml; INR 2-3 Monitor platelets by anticoagulation protocol: Yes   Plan:  - Heparin 1000 units/hr. No bolus - coumadin 2 mg po x1 - 8hr heparin level after drip is started - daily heparin level, CBC and INR  Numair Masden, Tsz-Yin 04/17/2015,11:20 AM

## 2015-04-17 NOTE — Progress Notes (Signed)
Utilization review completed. Haeli Gerlich, RN, BSN. 

## 2015-04-18 LAB — BASIC METABOLIC PANEL
Anion gap: 12 (ref 5–15)
BUN: 15 mg/dL (ref 6–20)
CALCIUM: 9 mg/dL (ref 8.9–10.3)
CO2: 25 mmol/L (ref 22–32)
CREATININE: 1.55 mg/dL — AB (ref 0.44–1.00)
Chloride: 106 mmol/L (ref 101–111)
GFR, EST AFRICAN AMERICAN: 37 mL/min — AB (ref >60)
GFR, EST NON AFRICAN AMERICAN: 32 mL/min — AB (ref >60)
GLUCOSE: 97 mg/dL (ref 65–99)
Potassium: 3.7 mmol/L (ref 3.5–5.1)
Sodium: 143 mmol/L (ref 135–145)

## 2015-04-18 LAB — PROTIME-INR
INR: 1.42 (ref 0.00–1.49)
PROTHROMBIN TIME: 17.5 s — AB (ref 11.6–15.2)

## 2015-04-18 LAB — CBC
HCT: 30.7 % — ABNORMAL LOW (ref 36.0–46.0)
Hemoglobin: 9.1 g/dL — ABNORMAL LOW (ref 12.0–15.0)
MCH: 27.7 pg (ref 26.0–34.0)
MCHC: 29.6 g/dL — ABNORMAL LOW (ref 30.0–36.0)
MCV: 93.3 fL (ref 78.0–100.0)
PLATELETS: 216 10*3/uL (ref 150–400)
RBC: 3.29 MIL/uL — ABNORMAL LOW (ref 3.87–5.11)
RDW: 16 % — AB (ref 11.5–15.5)
WBC: 4.1 10*3/uL (ref 4.0–10.5)

## 2015-04-18 LAB — HEPARIN LEVEL (UNFRACTIONATED)
HEPARIN UNFRACTIONATED: 0.35 [IU]/mL (ref 0.30–0.70)
Heparin Unfractionated: 0.61 IU/mL (ref 0.30–0.70)

## 2015-04-18 LAB — MAGNESIUM: Magnesium: 2.3 mg/dL (ref 1.7–2.4)

## 2015-04-18 MED ORDER — WARFARIN SODIUM 2 MG PO TABS
2.0000 mg | ORAL_TABLET | Freq: Once | ORAL | Status: DC
  Filled 2015-04-18: qty 1

## 2015-04-18 MED ORDER — WARFARIN SODIUM 4 MG PO TABS
4.0000 mg | ORAL_TABLET | Freq: Once | ORAL | Status: AC
  Administered 2015-04-18: 4 mg via ORAL
  Filled 2015-04-18: qty 1

## 2015-04-18 MED ORDER — PANTOPRAZOLE SODIUM 40 MG PO TBEC
40.0000 mg | DELAYED_RELEASE_TABLET | Freq: Every day | ORAL | Status: DC
  Administered 2015-04-18 – 2015-04-22 (×5): 40 mg via ORAL
  Filled 2015-04-18 (×5): qty 1

## 2015-04-18 NOTE — Progress Notes (Addendum)
ANTICOAGULATION CONSULT NOTE  Pharmacy Consult for heparin and coumadin Indication: atrial fibrillation  No Known Allergies  Patient Measurements:   Heparin Dosing Weight: 77 kg  Vital Signs: Temp: 98.2 F (36.8 C) (12/24 0456) Temp Source: Oral (12/24 0456) BP: 134/87 mmHg (12/24 0456) Pulse Rate: 77 (12/24 0456)  Labs:  Recent Labs  04/16/15 1330 04/16/15 1334 04/16/15 1720 04/17/15 0547 04/17/15 2050 04/18/15 0521 04/18/15 0530  HGB 8.9* 10.2* 9.1* 8.5*  --  9.1*  --   HCT 29.1* 30.0* 31.1* 27.8*  --  30.7*  --   PLT 201  --  225 200  --  216  --   LABPROT 18.4*  --   --  18.9*  --   --  17.5*  INR 1.52*  --   --  1.58*  --   --  1.42  HEPARINUNFRC  --   --   --   --  0.18*  --  0.35  CREATININE  --  1.80*  --  1.60*  --  1.55*  --     CrCl cannot be calculated (Unknown ideal weight.).   Medical History: Past Medical History  Diagnosis Date  . NICM (nonischemic cardiomyopathy) (HCC)     EF 35%; cath 2/12 no CAD  . Systolic CHF, chronic (HCC)   . Severe mitral regurgitation     s/p complex mitral valve repair with cox maze + LAA clipping, removal of RV lead, and implantation of CRT-D in abdomen  . Cardiac arrest - ventricular fibrillation     in 1990s  . Atrial fibrillation (HCC)     amiodarone  . HTN (hypertension)   . HLD (hyperlipidemia)   . Achalasia     s/p dilation  . Recurrent UTI   . Obesity   . Anoxic brain injury (HCC) 1996    s/p cardiac arrest; "in a coma for 16 days"  . Automatic implantable cardioverter-defibrillator in situ   . Hypothyroidism   . TIA (transient ischemic attack)   . Chronic kidney disease (CKD), stage IV (severe) (HCC)     [name] 10/03/2013    Medications:  Scheduled:  . cephALEXin  500 mg Oral 4 times per day  . Chlorhexidine Gluconate Cloth  6 each Topical Q0600  . DULoxetine  20 mg Oral Daily  . furosemide  160 mg Oral BID  . levothyroxine  50 mcg Oral QAC breakfast  . metoprolol tartrate  12.5 mg Oral BID   . mupirocin ointment  1 application Nasal BID  . pantoprazole (PROTONIX) IV  40 mg Intravenous Q24H  . sodium chloride  3 mL Intravenous Q12H  . warfarin  2 mg Oral ONCE-1800  . Warfarin - Pharmacist Dosing Inpatient   Does not apply q1800   Infusions:  . heparin 1,250 Units/hr (04/17/15 2245)    Assessment: 74 yo female with afib will be started back on coumadin while bridging with heparin (no bolus).  CHADVs score is 6.  Patient came in with rectal bleeding which team thinks could be diverticular vs internal hemorrhoids.  Seen by GI and underwent flexible sigmoidoscopy on 04/17/2015 with no evidence of active bleed.  Prior admission coumadin dose is 2 mg po daily (last dose was 04/15/15).    HL 0.35 on 1250 units/hr. INR 1.42 today, Hgb 9.1, PLT 216K   Confirmatory HL 0.61. Will continue at current rate and check daily heparin levels.   Goal of Therapy:  Heparin level 0.3-0.7 units/ml; INR 2-3 Monitor platelets by anticoagulation  protocol: Yes   Plan:  - Continue heparin @1250  units/hr  - coumadin 4 mg po x1 (if patient still in hospital) - daily heparin level, CBC and INR - Mon s/sx bleeding  Ivania Teagarden C. Marvis Moeller, PharmD Pharmacy Resident  Pager: [phone] 04/18/2015 8:50 AM

## 2015-04-18 NOTE — Progress Notes (Signed)
Foley catheter changed out due to leakage and signs of yellow creamy discharge at site. Vaginal area cleaned with foley cleansing wipes, betadine and inserted without complications. 16 fr Non latex catheter used. Pt tolerated procedure well.

## 2015-04-18 NOTE — Progress Notes (Signed)
Patient Demographics:    Margaret [name], is a 74 y.o. female, DOB - 07-06-1940, WKG:[id number]  Admit date - 04/16/2015   Admitting Physician Eddie North, MD  Outpatient Primary MD for the patient is [name], MD  LOS - 2   Chief Complaint  Patient presents with  . Vaginal Bleeding        Subjective:    Margaret [name] today has, No headache, No chest pain, No abdominal pain - No Nausea, No new weakness tingling or numbness, No Cough - SOB.     Assessment  & Plan :      1. Rectal bleeding. Could be diverticular versus internal hemorrhoids. Was on Coumadin with therapeutic INR, quit in on hold INR around 1.5, no evidence of ongoing bleed, H&H stable with minor blood loss related drop, no need for transfusion. Seen by GI and underwent flexible sigmoidoscopy on 04/17/2015 with no evidence of active bleed. We'll place her back on a liquid diet, if no further bleed resume Coumadin with Heparin, cautious INR and H&H monitoring. D/W Dr Randa Evens. Will be discharged once INR is 2 or above as her [place] Vasc score is extremely high.  Note patient was evaluated for possible vaginal bleeding at [facility] extensively prior to admission at [facility] and vaginal bleeding was ruled out at [facility].   2. Cellulitis of the right leg. Appears stable currently on Ancef continued.   3. Nonischemic cardiomyopathy with chronic systolic heart failure last EF 30-35% with AICD in place. 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 Currently compensated. Stop IV fluids, clear liquid diet, resume home dose beta blocker and Lasix.    4. Chronic indwelling Foley catheter. No signs of bleeding.   5. Chronic atrial fibrillation [place] Vasco score of 6. Goal will be rate controlled, continue beta  blocker, resume Coumadin tomorrow, if stable will start heparin drip later today.   6. Hypothyroidism. Currently on Synthroid.   7. Hypokalemia. Replaced   8. Mild dementia. At risk for delirium, minimize narcotics and benzodiazepines.     Code Status : DNR  Family Communication  : daughter x 2   Disposition Plan  : SNF once INR is 2  Consults  :  GI  Procedures  :   Flexible sigmoidoscopy with no evidence of bleeding  DVT Prophylaxis  :   Heparin/Coumadin  Lab Results  Component Value Date   PLT 216 04/18/2015    Inpatient Medications  Scheduled Meds: . cephALEXin  500 mg Oral 4 times per day  . Chlorhexidine Gluconate Cloth  6 each Topical Q0600  . DULoxetine  20 mg Oral Daily  . furosemide  160 mg Oral BID  . levothyroxine  50 mcg Oral QAC breakfast  . metoprolol tartrate  12.5 mg Oral BID  . mupirocin ointment  1 application Nasal BID  . pantoprazole (PROTONIX) IV  40 mg Intravenous Q24H  . sodium chloride  3 mL Intravenous Q12H  . warfarin  2 mg Oral ONCE-1800  . Warfarin - Pharmacist Dosing Inpatient   Does not apply q1800   Continuous Infusions: . heparin 1,250 Units/hr (04/17/15 2245)   PRN Meds:.acetaminophen **OR** acetaminophen  Antibiotics  :    Anti-infectives    Start  Dose/Rate Route Frequency Ordered Stop   04/17/15 1530  cephALEXin (KEFLEX) capsule 500 mg     500 mg Oral 4 times per day 04/17/15 1413     04/16/15 1800  ceFAZolin (ANCEF) IVPB 1 g/50 mL premix  Status:  Discontinued     1 g 100 mL/hr over 30 Minutes Intravenous 3 times per day 04/16/15 1625 04/17/15 1413   04/16/15 1600  ceFAZolin (ANCEF) IVPB 1 g/50 mL premix  Status:  Discontinued     1 g 100 mL/hr over 30 Minutes Intravenous 3 times per day 04/16/15 1551 04/16/15 1625        Objective:   Filed Vitals:   04/17/15 1404 04/17/15 2146 04/17/15 2242 04/18/15 0456  BP: 125/65 119/52 121/53 134/87  Pulse: 95 79 82 77  Temp: 97.1 F (36.2 C) 97.8 F (36.6 C)   98.2 F (36.8 C)  TempSrc: Oral Oral  Oral  Resp: SpO2: 98% 92%  99%    Wt Readings from Last 3 Encounters:  03/27/15 90.175 kg (198 lb 12.8 oz)  03/17/15 93.622 kg (206 lb 6.4 oz)  12/30/14 95.709 kg (211 lb)     Intake/Output Summary (Last 24 hours) at 04/18/15 1023 Last data filed at 04/18/15 1013  Gross per 24 hour  Intake  737.5 ml  Output   1600 ml  Net -862.5 ml     Physical Exam  Awake , mildly confused, No new F.N deficits, Normal affect Margaret [name].AT,PERRAL Supple Neck,No JVD, No cervical lymphadenopathy appriciated.  Symmetrical Chest wall movement, Good air movement bilaterally, CTAB RRR,No Gallops,Rubs or new Murmurs, No Parasternal Heave +ve B.Sounds, Abd Soft, No tenderness, No organomegaly appriciated, No rebound - guarding or rigidity. No Cyanosis, Clubbing or edema, No new Rash or bruise       Data Review:   Micro Results Recent Results (from the past 240 hour(s))  Wet prep, genital     Status: Abnormal   Collection Time: 04/14/15  4:05 PM  Result Value Ref Range Status   Yeast Wet Prep HPF POC NONE SEEN NONE SEEN Final   Trich, Wet Prep NONE SEEN NONE SEEN Final   Clue Cells Wet Prep HPF POC NONE SEEN NONE SEEN Final   WBC, Wet Prep HPF POC FEW (A) NONE SEEN Final    Comment: FEW BACTERIA SEEN   Sperm NONE SEEN  Final  MRSA PCR Screening     Status: Abnormal   Collection Time: 04/17/15  3:20 AM  Result Value Ref Range Status   MRSA by PCR POSITIVE (A) NEGATIVE Final    Comment:        The GeneXpert MRSA Assay (FDA approved for NASAL specimens only), is one component of a comprehensive MRSA colonization surveillance program. It is not intended to diagnose MRSA infection nor to guide or monitor treatment for MRSA infections. RESULT CALLED TO, READ BACK BY AND VERIFIED WITH: KRISTEN  04/17/15 [facility]     Radiology Reports US Transvaginal Non-ob  04/14/2015  CLINICAL DATA:  Postmenopausal bleeding. EXAM: TRANSABDOMINAL AND  TRANSVAGINAL ULTRASOUND OF PELVIS TECHNIQUE: Both transabdominal and transvaginal ultrasound examinations of the pelvis were performed. Transabdominal technique was performed for global imaging of the pelvis including uterus, ovaries, adnexal regions, and pelvic cul-de-sac. It was necessary to proceed with endovaginal exam following the transabdominal exam to visualize the uterus, endometrium and adnexa. COMPARISON:  None FINDINGS: Uterus Measurements: 5.8 x 2.4 x 3.6 cm. Calcifications noted peripherally. Endometrium Thickness: 2.6 mm.  No  focal abnormality visualized. Right ovary Measurements: Not visualized. No adnexal mass noted. Left ovary Measurements: Not visualize. No adnexal mass noted. Other findings No abnormal free fluid. IMPRESSION: 1. In the setting of post-menopausal bleeding, this is consistent with a benign etiology such as endometrial atrophy. If bleeding remains unresponsive to hormonal or medical therapy, sonohysterogram should be considered for focal lesion work-up. (Ref: Radiological Reasoning: Algorithmic Workup of Abnormal Vaginal Bleeding with Endovaginal Sonography and Sonohysterography. AJR 2008; 161:W96-04) Electronically Signed   By: Signa Kell M.D.   On: 04/14/2015 15:22   US Pelvis Complete  04/14/2015  CLINICAL DATA:  Postmenopausal bleeding. EXAM: TRANSABDOMINAL AND TRANSVAGINAL ULTRASOUND OF PELVIS TECHNIQUE: Both transabdominal and transvaginal ultrasound examinations of the pelvis were performed. Transabdominal technique was performed for global imaging of the pelvis including uterus, ovaries, adnexal regions, and pelvic cul-de-sac. It was necessary to proceed with endovaginal exam following the transabdominal exam to visualize the uterus, endometrium and adnexa. COMPARISON:  None FINDINGS: Uterus Measurements: 5.8 x 2.4 x 3.6 cm. Calcifications noted peripherally. Endometrium Thickness: 2.6 mm.  No focal abnormality visualized. Right ovary Measurements: Not visualized. No  adnexal mass noted. Left ovary Measurements: Not visualize. No adnexal mass noted. Other findings No abnormal free fluid. IMPRESSION: 1. In the setting of post-menopausal bleeding, this is consistent with a benign etiology such as endometrial atrophy. If bleeding remains unresponsive to hormonal or medical therapy, sonohysterogram should be considered for focal lesion work-up. (Ref: Radiological Reasoning: Algorithmic Workup of Abnormal Vaginal Bleeding with Endovaginal Sonography and Sonohysterography. AJR 2008; 540:J81-19) Electronically Signed   By: Signa Kell M.D.   On: 04/14/2015 15:22     CBC  Recent Labs Lab 04/14/15 1422 04/16/15 1330 04/16/15 1334 04/16/15 1720 04/17/15 0547 04/18/15 0521  WBC 5.4 4.2  --  4.3 3.4* 4.1  HGB 9.7* 8.9* 10.2* 9.1* 8.5* 9.1*  HCT 32.4* 29.1* 30.0* 31.1* 27.8* 30.7*  PLT 248 201  --  225 200 216  MCV 91.0 92.4  --  92.0 92.1 93.3  MCH 27.2 28.3  --  26.9 28.1 27.7  MCHC 29.9* 30.6  --  29.3* 30.6 29.6*  RDW 16.0* 16.0*  --  16.0* 16.0* 16.0*  LYMPHSABS  --  1.0  --   --   --   --   MONOABS  --  0.3  --   --   --   --   EOSABS  --  0.3  --   --   --   --   BASOSABS  --  0.0  --   --   --   --     Chemistries   Recent Labs Lab 04/16/15 1334 04/17/15 0547 04/18/15 0521  NA 141 143 143  K 3.8 3.1* 3.7  CL 102 105 106  CO2  --  28 25  GLUCOSE 101* 93 97  BUN 28* 20 15  CREATININE 1.80* 1.60* 1.55*  CALCIUM  --  8.6* 9.0  MG  --   --  2.3  AST  --  17  --   ALT  --  9*  --   ALKPHOS  --  63  --   BILITOT  --  0.9  --    ------------------------------------------------------------------------------------------------------------------ CrCl cannot be calculated (Unknown ideal weight.). ------------------------------------------------------------------------------------------------------------------ No results for input(s): HGBA1C in the last 72  hours. ------------------------------------------------------------------------------------------------------------------ No results for input(s): CHOL, HDL, LDLCALC, TRIG, CHOLHDL, LDLDIRECT in the last 72 hours. ------------------------------------------------------------------------------------------------------------------ No results for input(s): TSH, T4TOTAL, T3FREE, THYROIDAB in  the last 72 hours.  Invalid input(s): FREET3 ------------------------------------------------------------------------------------------------------------------ No results for input(s): VITAMINB12, FOLATE, FERRITIN, TIBC, IRON, RETICCTPCT in the last 72 hours.  Coagulation profile  Recent Labs Lab 04/16/15 1330 04/17/15 0547 04/18/15 0530  INR 1.52* 1.58* 1.42    No results for input(s): DDIMER in the last 72 hours.  Cardiac Enzymes No results for input(s): CKMB, TROPONINI, MYOGLOBIN in the last 168 hours.  Invalid input(s): CK ------------------------------------------------------------------------------------------------------------------ Invalid input(s): POCBNP   Time Spent in minutes  35   SINGH,PRASHANT K M.D on 04/18/2015 at 10:23 AM  Between 7am to 7pm - Pager - [phone]  After 7pm go to [email or web address] - password [email or web address]  [facility] -  Office  [phone]

## 2015-04-19 LAB — CBC
HCT: 30.7 % — ABNORMAL LOW (ref 36.0–46.0)
Hemoglobin: 8.9 g/dL — ABNORMAL LOW (ref 12.0–15.0)
MCH: 26.9 pg (ref 26.0–34.0)
MCHC: 29 g/dL — ABNORMAL LOW (ref 30.0–36.0)
MCV: 92.7 fL (ref 78.0–100.0)
PLATELETS: 233 10*3/uL (ref 150–400)
RBC: 3.31 MIL/uL — AB (ref 3.87–5.11)
RDW: 16 % — ABNORMAL HIGH (ref 11.5–15.5)
WBC: 4.6 10*3/uL (ref 4.0–10.5)

## 2015-04-19 LAB — HEPARIN LEVEL (UNFRACTIONATED): Heparin Unfractionated: 0.4 IU/mL (ref 0.30–0.70)

## 2015-04-19 LAB — PROTIME-INR
INR: 1.53 — ABNORMAL HIGH (ref 0.00–1.49)
Prothrombin Time: 18.4 seconds — ABNORMAL HIGH (ref 11.6–15.2)

## 2015-04-19 MED ORDER — WARFARIN SODIUM 4 MG PO TABS
4.0000 mg | ORAL_TABLET | Freq: Once | ORAL | Status: AC
  Administered 2015-04-19: 4 mg via ORAL
  Filled 2015-04-19: qty 1

## 2015-04-19 NOTE — Progress Notes (Signed)
Patient Demographics:    Margaret [name], is a 74 y.o. female, DOB - [birth year], UJW:[id number]  Admit date - 04/16/2015   Admitting Physician Eddie North, MD  Outpatient Primary MD for the patient is [name], MD  LOS - 3   Chief Complaint  Patient presents with  . Vaginal Bleeding        Subjective:    Margaret [name] today has, No headache, No chest pain, No abdominal pain - No Nausea, No new weakness tingling or numbness, No Cough - SOB.  Feels better.   Assessment  & Plan :      1. Rectal bleeding. Could be diverticular versus internal hemorrhoids. Was on Coumadin with therapeutic INR, quit in on hold INR around 1.5, no evidence of ongoing bleed, H&H stable with minor blood loss related drop, no need for transfusion. Seen by GI and underwent flexible sigmoidoscopy on 04/17/2015 with no evidence of active bleed. We'll place her back on a liquid diet, if no further bleed resume Coumadin with Heparin, cautious INR and H&H monitoring. D/W Dr Randa Evens. Will be discharged once INR is 2 or above as her [place] Vasc score is extremely high.  Note patient was evaluated for possible vaginal bleeding at [facility] extensively prior to admission at [facility] and vaginal bleeding was ruled out at [facility].   2. Cellulitis of the right leg. Appears stable currently on Ancef continued.   3. Nonischemic cardiomyopathy with chronic systolic heart failure last EF 30-35% with AICD in place. 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 Currently compensated. Stop IV fluids, clear liquid diet, resume home dose beta blocker and Lasix.    4. Chronic indwelling Foley catheter. No signs of bleeding.   5. Chronic atrial fibrillation [place] Vasco score of 6. Goal will be rate controlled,  continue beta blocker, resume Coumadin tomorrow, if stable will start heparin drip later today.   6. Hypothyroidism. Currently on Synthroid.   7. Hypokalemia. Replaced   8. Mild dementia. At risk for delirium, minimize narcotics and benzodiazepines.     Code Status : DNR  Family Communication  : daughter x 2   Disposition Plan  : SNF once INR is 2  Consults  :  GI  Procedures  :   Flexible sigmoidoscopy with no evidence of bleeding  DVT Prophylaxis  :   Heparin/Coumadin  Lab Results  Component Value Date   PLT 233 04/19/2015   Lab Results  Component Value Date   INR 1.53* 04/19/2015   INR 1.42 04/18/2015   INR 1.58* 04/17/2015     Inpatient Medications  Scheduled Meds: . cephALEXin  500 mg Oral 4 times per day  . Chlorhexidine Gluconate Cloth  6 each Topical Q0600  . DULoxetine  20 mg Oral Daily  . furosemide  160 mg Oral BID  . levothyroxine  50 mcg Oral QAC breakfast  . metoprolol tartrate  12.5 mg Oral BID  . mupirocin ointment  1 application Nasal BID  . pantoprazole  40 mg Oral Daily  . sodium chloride  3 mL Intravenous Q12H  . warfarin  4 mg Oral ONCE-1800  . Warfarin - Pharmacist Dosing Inpatient   Does not apply q1800   Continuous Infusions: .  heparin 1,250 Units/hr (04/19/15 0751)   PRN Meds:.acetaminophen **OR** acetaminophen  Antibiotics  :    Anti-infectives    Start     Dose/Rate Route Frequency Ordered Stop   04/17/15 1530  cephALEXin (KEFLEX) capsule 500 mg     500 mg Oral 4 times per day 04/17/15 1413     04/16/15 1800  ceFAZolin (ANCEF) IVPB 1 g/50 mL premix  Status:  Discontinued     1 g 100 mL/hr over 30 Minutes Intravenous 3 times per day 04/16/15 1625 04/17/15 1413   04/16/15 1600  ceFAZolin (ANCEF) IVPB 1 g/50 mL premix  Status:  Discontinued     1 g 100 mL/hr over 30 Minutes Intravenous 3 times per day 04/16/15 1551 04/16/15 1625        Objective:   Filed Vitals:   04/18/15 1210 04/18/15 1430 04/18/15 2113 04/19/15  0534  BP: 117/62 114/61 127/55 125/53  Pulse: 82 84 93 77  Temp:  98.6 F (37 C) 97.8 F (36.6 C) 97.5 F (36.4 C)  TempSrc:  Oral Oral Oral  Resp:  18 18 18   SpO2:  99% 93% 99%    Wt Readings from Last 3 Encounters:  03/27/15 90.175 kg (198 lb 12.8 oz)  03/17/15 93.622 kg (206 lb 6.4 oz)  12/30/14 95.709 kg (211 lb)     Intake/Output Summary (Last 24 hours) at 04/19/15 1035 Last data filed at 04/19/15 0534  Gross per 24 hour  Intake 510.83 ml  Output   2550 ml  Net -2039.17 ml     Physical Exam  Awake , mildly confused, No new F.N deficits, Normal affect [place].AT,PERRAL Supple Neck,No JVD, No cervical lymphadenopathy appriciated.  Symmetrical Chest wall movement, Good air movement bilaterally, CTAB RRR,No Gallops,Rubs or new Murmurs, No Parasternal Heave +ve B.Sounds, Abd Soft, No tenderness, No organomegaly appriciated, No rebound - guarding or rigidity. No Cyanosis, Clubbing or edema, No new Rash or bruise       Data Review:   Micro Results Recent Results (from the past 240 hour(s))  Wet prep, genital     Status: Abnormal   Collection Time: 04/14/15  4:05 PM  Result Value Ref Range Status   Yeast Wet Prep HPF POC NONE SEEN NONE SEEN Final   Trich, Wet Prep NONE SEEN NONE SEEN Final   Clue Cells Wet Prep HPF POC NONE SEEN NONE SEEN Final   WBC, Wet Prep HPF POC FEW (A) NONE SEEN Final    Comment: FEW BACTERIA SEEN   Sperm NONE SEEN  Final  MRSA PCR Screening     Status: Abnormal   Collection Time: 04/17/15  3:20 AM  Result Value Ref Range Status   MRSA by PCR POSITIVE (A) NEGATIVE Final    Comment:        The GeneXpert MRSA Assay (FDA approved for NASAL specimens only), is one component of a comprehensive MRSA colonization surveillance program. It is not intended to diagnose MRSA infection nor to guide or monitor treatment for MRSA infections. RESULT CALLED TO, READ BACK BY AND VERIFIED WITH: KRISTEN @0515  04/17/15 [facility]     Radiology Reports US  Transvaginal Non-ob  04/14/2015  CLINICAL DATA:  Postmenopausal bleeding. EXAM: TRANSABDOMINAL AND TRANSVAGINAL ULTRASOUND OF PELVIS TECHNIQUE: Both transabdominal and transvaginal ultrasound examinations of the pelvis were performed. Transabdominal technique was performed for global imaging of the pelvis including uterus, ovaries, adnexal regions, and pelvic cul-de-sac. It was necessary to proceed with endovaginal exam following the transabdominal exam to visualize  the uterus, endometrium and adnexa. COMPARISON:  None FINDINGS: Uterus Measurements: 5.8 x 2.4 x 3.6 cm. Calcifications noted peripherally. Endometrium Thickness: 2.6 mm.  No focal abnormality visualized. Right ovary Measurements: Not visualized. No adnexal mass noted. Left ovary Measurements: Not visualize. No adnexal mass noted. Other findings No abnormal free fluid. IMPRESSION: 1. In the setting of post-menopausal bleeding, this is consistent with a benign etiology such as endometrial atrophy. If bleeding remains unresponsive to hormonal or medical therapy, sonohysterogram should be considered for focal lesion work-up. (Ref: Radiological Reasoning: Algorithmic Workup of Abnormal Vaginal Bleeding with Endovaginal Sonography and Sonohysterography. AJR 2008; 161:W96-04) Electronically Signed   By: Signa Kell M.D.   On: 04/14/2015 15:22   US Pelvis Complete  04/14/2015  CLINICAL DATA:  Postmenopausal bleeding. EXAM: TRANSABDOMINAL AND TRANSVAGINAL ULTRASOUND OF PELVIS TECHNIQUE: Both transabdominal and transvaginal ultrasound examinations of the pelvis were performed. Transabdominal technique was performed for global imaging of the pelvis including uterus, ovaries, adnexal regions, and pelvic cul-de-sac. It was necessary to proceed with endovaginal exam following the transabdominal exam to visualize the uterus, endometrium and adnexa. COMPARISON:  None FINDINGS: Uterus Measurements: 5.8 x 2.4 x 3.6 cm. Calcifications noted peripherally.  Endometrium Thickness: 2.6 mm.  No focal abnormality visualized. Right ovary Measurements: Not visualized. No adnexal mass noted. Left ovary Measurements: Not visualize. No adnexal mass noted. Other findings No abnormal free fluid. IMPRESSION: 1. In the setting of post-menopausal bleeding, this is consistent with a benign etiology such as endometrial atrophy. If bleeding remains unresponsive to hormonal or medical therapy, sonohysterogram should be considered for focal lesion work-up. (Ref: Radiological Reasoning: Algorithmic Workup of Abnormal Vaginal Bleeding with Endovaginal Sonography and Sonohysterography. AJR 2008; 540:J81-19) Electronically Signed   By: Signa Kell M.D.   On: 04/14/2015 15:22     CBC  Recent Labs Lab 04/16/15 1330 04/16/15 1334 04/16/15 1720 04/17/15 0547 04/18/15 0521 04/19/15 0631  WBC 4.2  --  4.3 3.4* 4.1 4.6  HGB 8.9* 10.2* 9.1* 8.5* 9.1* 8.9*  HCT 29.1* 30.0* 31.1* 27.8* 30.7* 30.7*  PLT 201  --  225 200 216 233  MCV 92.4  --  92.0 92.1 93.3 92.7  MCH 28.3  --  26.9 28.1 27.7 26.9  MCHC 30.6  --  29.3* 30.6 29.6* 29.0*  RDW 16.0*  --  16.0* 16.0* 16.0* 16.0*  LYMPHSABS 1.0  --   --   --   --   --   MONOABS 0.3  --   --   --   --   --   EOSABS 0.3  --   --   --   --   --   BASOSABS 0.0  --   --   --   --   --     Chemistries   Recent Labs Lab 04/16/15 1334 04/17/15 0547 04/18/15 0521  NA 141 143 143  K 3.8 3.1* 3.7  CL 102 105 106  CO2  --  28 25  GLUCOSE 101* 93 97  BUN 28* 20 15  CREATININE 1.80* 1.60* 1.55*  CALCIUM  --  8.6* 9.0  MG  --   --  2.3  AST  --  17  --   ALT  --  9*  --   ALKPHOS  --  63  --   BILITOT  --  0.9  --    ------------------------------------------------------------------------------------------------------------------ CrCl cannot be calculated (Unknown ideal weight.). ------------------------------------------------------------------------------------------------------------------ No results for input(s):  HGBA1C in the last 72 hours. ------------------------------------------------------------------------------------------------------------------  No results for input(s): CHOL, HDL, LDLCALC, TRIG, CHOLHDL, LDLDIRECT in the last 72 hours. ------------------------------------------------------------------------------------------------------------------ No results for input(s): TSH, T4TOTAL, T3FREE, THYROIDAB in the last 72 hours.  Invalid input(s): FREET3 ------------------------------------------------------------------------------------------------------------------ No results for input(s): VITAMINB12, FOLATE, FERRITIN, TIBC, IRON, RETICCTPCT in the last 72 hours.  Coagulation profile  Recent Labs Lab 04/16/15 1330 04/17/15 0547 04/18/15 0530 04/19/15 0631  INR 1.52* 1.58* 1.42 1.53*    No results for input(s): DDIMER in the last 72 hours.  Cardiac Enzymes No results for input(s): CKMB, TROPONINI, MYOGLOBIN in the last 168 hours.  Invalid input(s): CK ------------------------------------------------------------------------------------------------------------------ Invalid input(s): POCBNP   Time Spent in minutes  35   SINGH,PRASHANT K M.D on 04/19/2015 at 10:35 AM  Between 7am to 7pm - Pager - [phone]  After 7pm go to [email or web address] - password [facility]  [facility] -  Office  [phone]

## 2015-04-19 NOTE — Progress Notes (Signed)
ANTICOAGULATION CONSULT NOTE  Pharmacy Consult for heparin and coumadin Indication: atrial fibrillation  No Known Allergies  Patient Measurements:   Heparin Dosing Weight: 77 kg  Vital Signs: Temp: 97.5 F (36.4 C) (12/25 0534) Temp Source: Oral (12/25 0534) BP: 125/53 mmHg (12/25 0534) Pulse Rate: 77 (12/25 0534)  Labs:  Recent Labs  04/16/15 1334  04/17/15 0547  04/18/15 0521 04/18/15 0530 04/18/15 1342 04/19/15 0631  HGB 10.2*  < > 8.5*  --  9.1*  --   --  8.9*  HCT 30.0*  < > 27.8*  --  30.7*  --   --  30.7*  PLT  --   < > 200  --  216  --   --  233  LABPROT  --   --  18.9*  --   --  17.5*  --  18.4*  INR  --   --  1.58*  --   --  1.42  --  1.53*  HEPARINUNFRC  --   --   --   < >  --  0.35 0.61 0.40  CREATININE 1.80*  --  1.60*  --  1.55*  --   --   --   < > = values in this interval not displayed.  CrCl cannot be calculated (Unknown ideal weight.).   Medical History: Past Medical History  Diagnosis Date  . NICM (nonischemic cardiomyopathy) (HCC)     EF 35%; cath 2/12 no CAD  . Systolic CHF, chronic (HCC)   . Severe mitral regurgitation     s/p complex mitral valve repair with cox maze + LAA clipping, removal of RV lead, and implantation of CRT-D in abdomen  . Cardiac arrest - ventricular fibrillation     in 1990s  . Atrial fibrillation (HCC)     amiodarone  . HTN (hypertension)   . HLD (hyperlipidemia)   . Achalasia     s/p dilation  . Recurrent UTI   . Obesity   . Anoxic brain injury (HCC) 1996    s/p cardiac arrest; "in a coma for 16 days"  . Automatic implantable cardioverter-defibrillator in situ   . Hypothyroidism   . TIA (transient ischemic attack)   . Chronic kidney disease (CKD), stage IV (severe) (HCC)     [name] 10/03/2013    Medications:  Scheduled:  . cephALEXin  500 mg Oral 4 times per day  . Chlorhexidine Gluconate Cloth  6 each Topical Q0600  . DULoxetine  20 mg Oral Daily  . furosemide  160 mg Oral BID  . levothyroxine  50  mcg Oral QAC breakfast  . metoprolol tartrate  12.5 mg Oral BID  . mupirocin ointment  1 application Nasal BID  . pantoprazole  40 mg Oral Daily  . sodium chloride  3 mL Intravenous Q12H  . warfarin  4 mg Oral ONCE-1800  . Warfarin - Pharmacist Dosing Inpatient   Does not apply q1800   Infusions:  . heparin 1,250 Units/hr (04/19/15 0751)    Assessment: 74 yo female with afib will be started back on coumadin while bridging with heparin (no bolus).  CHADVs score is 6.  Patient came in with rectal bleeding which team thinks could be diverticular vs internal hemorrhoids.  Seen by GI and underwent flexible sigmoidoscopy on 04/17/2015 with no evidence of active bleed.  Prior admission coumadin dose is 2 mg po daily (last dose was 04/15/15).    AM HL therapeutic (0.40) on 1250 units/hr.   INR 1.53 today,  Hgb 8.9 and Plt 233 K today  Goal of Therapy:  Heparin level 0.3-0.7 units/ml; INR 2-3 Monitor platelets by anticoagulation protocol: Yes   Plan:  - Continue heparin  units/hr  - coumadin 4 mg po x1 again tonight - daily HL, CBC and INR - Mon s/sx bleeding  Jayce Boyko C. Marvis Moeller, PharmD Pharmacy Resident  Pager: [phone] 04/19/2015 8:36 AM

## 2015-04-20 ENCOUNTER — Encounter (HOSPITAL_COMMUNITY): Payer: Self-pay | Admitting: Gastroenterology

## 2015-04-20 LAB — CBC
HCT: 29.5 % — ABNORMAL LOW (ref 36.0–46.0)
Hemoglobin: 8.9 g/dL — ABNORMAL LOW (ref 12.0–15.0)
MCH: 27.7 pg (ref 26.0–34.0)
MCHC: 30.2 g/dL (ref 30.0–36.0)
MCV: 91.9 fL (ref 78.0–100.0)
PLATELETS: 218 10*3/uL (ref 150–400)
RBC: 3.21 MIL/uL — ABNORMAL LOW (ref 3.87–5.11)
RDW: 15.8 % — AB (ref 11.5–15.5)
WBC: 4.9 10*3/uL (ref 4.0–10.5)

## 2015-04-20 LAB — GLUCOSE, CAPILLARY: Glucose-Capillary: 127 mg/dL — ABNORMAL HIGH (ref 65–99)

## 2015-04-20 LAB — PROTIME-INR
INR: 1.54 — AB (ref 0.00–1.49)
PROTHROMBIN TIME: 18.6 s — AB (ref 11.6–15.2)

## 2015-04-20 LAB — URINE CULTURE: Culture: NO GROWTH

## 2015-04-20 LAB — HEPARIN LEVEL (UNFRACTIONATED): HEPARIN UNFRACTIONATED: 0.39 [IU]/mL (ref 0.30–0.70)

## 2015-04-20 MED ORDER — WARFARIN SODIUM 5 MG PO TABS
5.0000 mg | ORAL_TABLET | Freq: Once | ORAL | Status: AC
  Administered 2015-04-20: 5 mg via ORAL
  Filled 2015-04-20: qty 1

## 2015-04-20 NOTE — Progress Notes (Signed)
Patient Demographics:    Margaret [name], is a 74 y.o. female, DOB - 10-Jun-1940, ZOX:[id number]  Admit date - 04/16/2015   Admitting Physician Eddie North, MD  Outpatient Primary MD for the patient is Margaret Grout, MD  LOS - 4   Chief Complaint  Patient presents with  . Vaginal Bleeding        Subjective:    Margaret [name] today has, No headache, No chest pain, No abdominal pain - No Nausea, No new weakness tingling or numbness, No Cough - SOB.  She continues to feel  better.   Assessment  & Plan :      1. Rectal bleeding. Could be diverticular versus internal hemorrhoids. Was on Coumadin with therapeutic INR, quit in on hold INR around 1.5, no evidence of ongoing bleed, H&H stable with minor blood loss related drop, no need for transfusion. Seen by GI and underwent flexible sigmoidoscopy on 04/17/2015 with no evidence of active bleed. We'll place her back on a liquid diet, if no further bleed resume Coumadin with Heparin, cautious INR and H&H monitoring. D/W Dr Margaret [name]. Will be discharged once INR is 2 or above as her [place] Vasc score is extremely high.  Note patient was evaluated for possible vaginal bleeding at [facility] extensively prior to admission at [facility] and vaginal bleeding was ruled out at [facility]. Must follow with [facility] outpatient post discharge.   2. Cellulitis of the right leg. Appears stable currently on Ancef continued.   3. Nonischemic cardiomyopathy with chronic systolic heart failure last EF 30-35% with AICD in place. 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 Currently compensated. Stop IV fluids, clear liquid diet, resume home dose beta blocker and Lasix.    4. Chronic indwelling Foley catheter. No signs of bleeding.   5. Chronic atrial  fibrillation [place] Vasco score of 6. Goal will be rate controlled, continue beta blocker, resume Coumadin tomorrow, if stable will start heparin drip later today.   6. Hypothyroidism. Currently on Synthroid.   7. Hypokalemia. Replaced and stable   8. Mild dementia. At risk for delirium, minimize narcotics and benzodiazepines.     Code Status : DNR  Family Communication  : daughter x 2   Disposition Plan  : SNF once INR is 2  Consults  :  GI  Procedures  :   Flexible sigmoidoscopy with no evidence of bleeding  DVT Prophylaxis  :   Heparin/Coumadin  Lab Results  Component Value Date   PLT 218 04/20/2015   Lab Results  Component Value Date   INR 1.54* 04/20/2015   INR 1.53* 04/19/2015   INR 1.42 04/18/2015     Inpatient Medications  Scheduled Meds: . cephALEXin  500 mg Oral 4 times per day  . Chlorhexidine Gluconate Cloth  6 each Topical Q0600  . DULoxetine  20 mg Oral Daily  . furosemide  160 mg Oral BID  . levothyroxine  50 mcg Oral QAC breakfast  . metoprolol tartrate  12.5 mg Oral BID  . mupirocin ointment  1 application Nasal BID  . pantoprazole  40 mg Oral Daily  . sodium chloride  3 mL Intravenous Q12H  . warfarin  5 mg Oral ONCE-1800  . Warfarin - Pharmacist  Dosing Inpatient   Does not apply q1800   Continuous Infusions: . heparin 1,250 Units/hr (04/20/15 0003)   PRN Meds:.acetaminophen **OR** acetaminophen  Antibiotics  :    Anti-infectives    Start     Dose/Rate Route Frequency Ordered Stop   04/17/15 1530  cephALEXin (KEFLEX) capsule 500 mg     500 mg Oral 4 times per day 04/17/15 1413     04/16/15 1800  ceFAZolin (ANCEF) IVPB 1 g/50 mL premix  Status:  Discontinued     1 g 100 mL/hr over 30 Minutes Intravenous 3 times per day 04/16/15 1625 04/17/15 1413   04/16/15 1600  ceFAZolin (ANCEF) IVPB 1 g/50 mL premix  Status:  Discontinued     1 g 100 mL/hr over 30 Minutes Intravenous 3 times per day 04/16/15 1551 04/16/15 1625         Objective:   Filed Vitals:   04/18/15 2113 04/19/15 0534 04/19/15 1500 04/19/15 2215  BP: 127/55 125/53 124/60 109/61  Pulse: 93 77 67 86  Temp: 97.8 F (36.6 C) 97.5 F (36.4 C) 97.1 F (36.2 C) 98.4 F (36.9 C)  TempSrc: Oral Oral Oral Oral  Resp: 18 18 18 20   Height:      Weight:      SpO2: 93% 99% 100% 97%    Wt Readings from Last 3 Encounters:  04/16/15 89.812 kg (198 lb)  03/27/15 90.175 kg (198 lb 12.8 oz)  03/17/15 93.622 kg (206 lb 6.4 oz)     Intake/Output Summary (Last 24 hours) at 04/20/15 0929 Last data filed at 04/20/15 0500  Gross per 24 hour  Intake 231.46 ml  Output   1400 ml  Net -1168.54 ml     Physical Exam  Awake , mildly confused, No new F.N deficits, Normal affect [place].AT,PERRAL Supple Neck,No JVD, No cervical lymphadenopathy appriciated.  Symmetrical Chest wall movement, Good air movement bilaterally, CTAB RRR,No Gallops,Rubs or new Murmurs, No Parasternal Heave +ve B.Sounds, Abd Soft, No tenderness, No organomegaly appriciated, No rebound - guarding or rigidity. No Cyanosis, Clubbing or edema, No new Rash or bruise       Data Review:   Micro Results Recent Results (from the past 240 hour(s))  Wet prep, genital     Status: Abnormal   Collection Time: 04/14/15  4:05 PM  Result Value Ref Range Status   Yeast Wet Prep HPF POC NONE SEEN NONE SEEN Final   Trich, Wet Prep NONE SEEN NONE SEEN Final   Clue Cells Wet Prep HPF POC NONE SEEN NONE SEEN Final   WBC, Wet Prep HPF POC FEW (A) NONE SEEN Final    Comment: FEW BACTERIA SEEN   Sperm NONE SEEN  Final  MRSA PCR Screening     Status: Abnormal   Collection Time: 04/17/15  3:20 AM  Result Value Ref Range Status   MRSA by PCR POSITIVE (A) NEGATIVE Final    Comment:        The GeneXpert MRSA Assay (FDA approved for NASAL specimens only), is one component of a comprehensive MRSA colonization surveillance program. It is not intended to diagnose MRSA infection nor to guide or monitor  treatment for MRSA infections. RESULT CALLED TO, READ BACK BY AND VERIFIED WITH: KRISTEN @0515  04/17/15 MKELLY   Urine culture     Status: None (Preliminary result)   Collection Time: 04/18/15  4:46 PM  Result Value Ref Range Status   Specimen Description URINE, CLEAN CATCH  Final   Special Requests NONE  Final   Culture NO GROWTH < 24 HOURS  Final   Report Status PENDING  Incomplete    Radiology Reports US Transvaginal Non-ob  04/14/2015  CLINICAL DATA:  Postmenopausal bleeding. EXAM: TRANSABDOMINAL AND TRANSVAGINAL ULTRASOUND OF PELVIS TECHNIQUE: Both transabdominal and transvaginal ultrasound examinations of the pelvis were performed. Transabdominal technique was performed for global imaging of the pelvis including uterus, ovaries, adnexal regions, and pelvic cul-de-sac. It was necessary to proceed with endovaginal exam following the transabdominal exam to visualize the uterus, endometrium and adnexa. COMPARISON:  None FINDINGS: Uterus Measurements: 5.8 x 2.4 x 3.6 cm. Calcifications noted peripherally. Endometrium Thickness: 2.6 mm.  No focal abnormality visualized. Right ovary Measurements: Not visualized. No adnexal mass noted. Left ovary Measurements: Not visualize. No adnexal mass noted. Other findings No abnormal free fluid. IMPRESSION: 1. In the setting of post-menopausal bleeding, this is consistent with a benign etiology such as endometrial atrophy. If bleeding remains unresponsive to hormonal or medical therapy, sonohysterogram should be considered for focal lesion work-up. (Ref: Radiological Reasoning: Algorithmic Workup of Abnormal Vaginal Bleeding with Endovaginal Sonography and Sonohysterography. AJR 2008; 865:H84-69) Electronically Signed   By: Signa Kell M.D.   On: 04/14/2015 15:22   US Pelvis Complete  04/14/2015  CLINICAL DATA:  Postmenopausal bleeding. EXAM: TRANSABDOMINAL AND TRANSVAGINAL ULTRASOUND OF PELVIS TECHNIQUE: Both transabdominal and transvaginal ultrasound  examinations of the pelvis were performed. Transabdominal technique was performed for global imaging of the pelvis including uterus, ovaries, adnexal regions, and pelvic cul-de-sac. It was necessary to proceed with endovaginal exam following the transabdominal exam to visualize the uterus, endometrium and adnexa. COMPARISON:  None FINDINGS: Uterus Measurements: 5.8 x 2.4 x 3.6 cm. Calcifications noted peripherally. Endometrium Thickness: 2.6 mm.  No focal abnormality visualized. Right ovary Measurements: Not visualized. No adnexal mass noted. Left ovary Measurements: Not visualize. No adnexal mass noted. Other findings No abnormal free fluid. IMPRESSION: 1. In the setting of post-menopausal bleeding, this is consistent with a benign etiology such as endometrial atrophy. If bleeding remains unresponsive to hormonal or medical therapy, sonohysterogram should be considered for focal lesion work-up. (Ref: Radiological Reasoning: Algorithmic Workup of Abnormal Vaginal Bleeding with Endovaginal Sonography and Sonohysterography. AJR 2008; 629:B28-41) Electronically Signed   By: Signa Kell M.D.   On: 04/14/2015 15:22     CBC  Recent Labs Lab 04/16/15 1330  04/16/15 1720 04/17/15 0547 04/18/15 0521 04/19/15 0631 04/20/15 0620  WBC 4.2  --  4.3 3.4* 4.1 4.6 4.9  HGB 8.9*  < > 9.1* 8.5* 9.1* 8.9* 8.9*  HCT 29.1*  < > 31.1* 27.8* 30.7* 30.7* 29.5*  PLT 201  --  225 200 216 233 218  MCV 92.4  --  92.0 92.1 93.3 92.7 91.9  MCH 28.3  --  26.9 28.1 27.7 26.9 27.7  MCHC 30.6  --  29.3* 30.6 29.6* 29.0* 30.2  RDW 16.0*  --  16.0* 16.0* 16.0* 16.0* 15.8*  LYMPHSABS 1.0  --   --   --   --   --   --   MONOABS 0.3  --   --   --   --   --   --   EOSABS 0.3  --   --   --   --   --   --   BASOSABS 0.0  --   --   --   --   --   --   < > = values in this interval not displayed.  Chemistries   Recent Labs  Lab 04/16/15 1334 04/17/15 0547 04/18/15 0521  NA 141 143 143  K 3.8 3.1* 3.7  CL 102 105 106  CO2   --  28 25  GLUCOSE 101* 93 97  BUN 28* 20 15  CREATININE 1.80* 1.60* 1.55*  CALCIUM  --  8.6* 9.0  MG  --   --  2.3  AST  --  17  --   ALT  --  9*  --   ALKPHOS  --  63  --   BILITOT  --  0.9  --    ------------------------------------------------------------------------------------------------------------------ estimated creatinine clearance is 34.7 mL/min (by C-G formula based on Cr of 1.55). ------------------------------------------------------------------------------------------------------------------ No results for input(s): HGBA1C in the last 72 hours. ------------------------------------------------------------------------------------------------------------------ No results for input(s): CHOL, HDL, LDLCALC, TRIG, CHOLHDL, LDLDIRECT in the last 72 hours. ------------------------------------------------------------------------------------------------------------------ No results for input(s): TSH, T4TOTAL, T3FREE, THYROIDAB in the last 72 hours.  Invalid input(s): FREET3 ------------------------------------------------------------------------------------------------------------------ No results for input(s): VITAMINB12, FOLATE, FERRITIN, TIBC, IRON, RETICCTPCT in the last 72 hours.  Coagulation profile  Recent Labs Lab 04/16/15 1330 04/17/15 0547 04/18/15 0530 04/19/15 0631 04/20/15 0620  INR 1.52* 1.58* 1.42 1.53* 1.54*    No results for input(s): DDIMER in the last 72 hours.  Cardiac Enzymes No results for input(s): CKMB, TROPONINI, MYOGLOBIN in the last 168 hours.  Invalid input(s): CK ------------------------------------------------------------------------------------------------------------------ Invalid input(s): POCBNP   Time Spent in minutes  35   SINGH,PRASHANT K M.D on 04/20/2015 at 9:29 AM  Between 7am to 7pm - Pager - [phone]  After 7pm go to [email or web address] - password [facility]  [facility] -  Office  [phone]

## 2015-04-20 NOTE — Progress Notes (Signed)
Patient discussed with Dr. Thedore Mins. Still awaiting INR of 2 before he can allow d/c to SNF.  Notified Admissions at [facility].  Unsure if bed will remain available- will need to follow up with SNF once INR is therapeutic. Lorri Frederick. Jaci Lazier, [place] [phone]

## 2015-04-20 NOTE — Progress Notes (Signed)
ANTICOAGULATION CONSULT NOTE  Pharmacy Consult for heparin and coumadin Indication: atrial fibrillation  No Known Allergies  Patient Measurements: Height:  (165.1 cm) Weight: 198 lb (89.812 kg) IBW/kg (Calculated) : 57 Heparin Dosing Weight: 77 kg  Vital Signs: Temp: 98.4 F (36.9 C) (12/25 2215) Temp Source: Oral (12/25 2215) BP: 109/61 mmHg (12/25 2215) Pulse Rate: 86 (12/25 2215)  Labs:  Recent Labs  04/18/15 0521 04/18/15 0530 04/18/15 1342 04/19/15 0631 04/20/15 0620  HGB 9.1*  --   --  8.9* 8.9*  HCT 30.7*  --   --  30.7* 29.5*  PLT 216  --   --  233 218  LABPROT  --  17.5*  --  18.4* 18.6*  INR  --  1.42  --  1.53* 1.54*  HEPARINUNFRC  --  0.35 0.61 0.40 0.39  CREATININE 1.55*  --   --   --   --     Estimated Creatinine Clearance: 34.7 mL/min (by C-G formula based on Cr of 1.55).   Medical History: Past Medical History  Diagnosis Date  . NICM (nonischemic cardiomyopathy) (HCC)     EF 35%; cath 2/12 no CAD  . Systolic CHF, chronic (HCC)   . Severe mitral regurgitation     s/p complex mitral valve repair with cox maze + LAA clipping, removal of RV lead, and implantation of CRT-D in abdomen  . Cardiac arrest - ventricular fibrillation     in 1990s  . Atrial fibrillation (HCC)     amiodarone  . HTN (hypertension)   . HLD (hyperlipidemia)   . Achalasia     s/p dilation  . Recurrent UTI   . Obesity   . Anoxic brain injury (HCC) 1996    s/p cardiac arrest; "in a coma for 16 days"  . Automatic implantable cardioverter-defibrillator in situ   . Hypothyroidism   . TIA (transient ischemic attack)   . Chronic kidney disease (CKD), stage IV (severe) (HCC)     [name] 10/03/2013    Medications:  Scheduled:  . cephALEXin  500 mg Oral 4 times per day  . Chlorhexidine Gluconate Cloth  6 each Topical Q0600  . DULoxetine  20 mg Oral Daily  . furosemide  160 mg Oral BID  . levothyroxine  50 mcg Oral QAC breakfast  . metoprolol tartrate  12.5 mg Oral  BID  . mupirocin ointment  1 application Nasal BID  . pantoprazole  40 mg Oral Daily  . sodium chloride  3 mL Intravenous Q12H  . Warfarin - Pharmacist Dosing Inpatient   Does not apply q1800   Infusions:  . heparin 1,250 Units/hr (04/20/15 0003)    Assessment: 74 yo female with afib will be started back on coumadin while bridging with heparin (no bolus).  PTA Coumadin was  daily and came in subtherapeutic at 1.58. CHADVs score is 6.  Patient came in with rectal/vaginal bleeding which team thinks could be diverticular vs internal hemorrhoids.  Seen by GI and underwent flexible sigmoidoscopy on 04/17/2015 with no evidence of active bleed. HL is therapeutic at 0.39. Has had 2 coumadin doses of . INR has not changed much and is 1.54. Hgb low but stable at 8.9, plts wnl. No s/s of current bleed.  Goal of Therapy:  Heparin level 0.3-0.7 units/ml; INR 2-3 Monitor platelets by anticoagulation protocol: Yes   Plan:  Continue heparin gtt at 1250 units/hr Give coumadin  PO x 1 tonight Monitor daily INR, HL, CBC, s/s of bleed Plan to  d/c patient once INR >2  Enzo Bi, PharmD, [facility] Clinical Pharmacist Pager [phone] 04/20/2015 8:28 AM

## 2015-04-20 NOTE — Care Management Important Message (Signed)
Important Message  Patient Details  Name: Margaret [name] MRN: [id number] Date of Birth: 1940-05-14   Medicare Important Message Given:  Yes    Lawerance Sabal, RN 04/20/2015, 11:49 AMImportant Message  Patient Details  Name: Margaret [name] MRN: [id number] Date of Birth: 1941/03/27   Medicare Important Message Given:  Yes    Lawerance Sabal, RN 04/20/2015, 11:49 AM

## 2015-04-21 LAB — CBC
HEMATOCRIT: 27.6 % — AB (ref 36.0–46.0)
HEMOGLOBIN: 8.2 g/dL — AB (ref 12.0–15.0)
MCH: 27.2 pg (ref 26.0–34.0)
MCHC: 29.7 g/dL — ABNORMAL LOW (ref 30.0–36.0)
MCV: 91.7 fL (ref 78.0–100.0)
Platelets: 199 10*3/uL (ref 150–400)
RBC: 3.01 MIL/uL — ABNORMAL LOW (ref 3.87–5.11)
RDW: 15.9 % — ABNORMAL HIGH (ref 11.5–15.5)
WBC: 4.2 10*3/uL (ref 4.0–10.5)

## 2015-04-21 LAB — HEPARIN LEVEL (UNFRACTIONATED): HEPARIN UNFRACTIONATED: 0.43 [IU]/mL (ref 0.30–0.70)

## 2015-04-21 LAB — PROTIME-INR
INR: 1.63 — AB (ref 0.00–1.49)
PROTHROMBIN TIME: 19.3 s — AB (ref 11.6–15.2)

## 2015-04-21 MED ORDER — WARFARIN SODIUM 5 MG PO TABS
5.0000 mg | ORAL_TABLET | Freq: Once | ORAL | Status: AC
  Administered 2015-04-21: 5 mg via ORAL
  Filled 2015-04-21: qty 1

## 2015-04-21 NOTE — Progress Notes (Signed)
Patient Demographics:    Margaret [name], is a 74 y.o. female, DOB - [birth year], ZOX:[id number]  Admit date - 04/16/2015   Admitting Physician No admitting provider for patient encounter.  Outpatient Primary MD for the patient is [name], MD  LOS - 5   Chief Complaint  Patient presents with  . Vaginal Bleeding        Subjective:    Margaret [name] today has, No headache, No chest pain, No abdominal pain - No Nausea, No new weakness tingling or numbness, No Cough - SOB.  She continues to feel  better.   Assessment  & Plan :      1. Rectal bleeding. Could be diverticular versus internal hemorrhoids. Was on Coumadin with therapeutic INR, quit in on hold INR around 1.5, no evidence of ongoing bleed, H&H stable with minor blood loss related drop, no need for transfusion. Seen by GI and underwent flexible sigmoidoscopy on 04/17/2015 with no evidence of active bleed. We'll place her back on a liquid diet, if no further bleed resume Coumadin with Heparin, cautious INR and H&H monitoring. D/W Dr Randa Evens. Will be discharged once INR is 2 or above as her [place] Vasc score is extremely high.  Note patient was evaluated for possible vaginal bleeding at [facility] extensively prior to admission at [facility] and vaginal bleeding was ruled out at [facility]. Must follow with [facility] outpatient post discharge.   2. Cellulitis of the right leg. Appears stable currently on Ancef continued.   3. Nonischemic cardiomyopathy with chronic systolic heart failure last EF 30-35% with AICD in place. 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 Currently compensated. Stop IV fluids, clear liquid diet, resume home dose beta blocker and Lasix.    4. Chronic indwelling Foley catheter. No signs of  bleeding.   5. Chronic atrial fibrillation [place] Vasc score of 6. Goal will be rate controlled, continue beta blocker, resume Coumadin tomorrow, if stable will start heparin drip later today.   6. Hypothyroidism. Currently on Synthroid.   7. Hypokalemia. Replaced and stable   8. Mild dementia. At risk for delirium, minimize narcotics and benzodiazepines.     Code Status : DNR  Family Communication  : daughter x 2   Disposition Plan  : SNF once INR is 2  Consults  :  GI  Procedures  :   Flexible sigmoidoscopy with no evidence of bleeding  DVT Prophylaxis  :   Heparin/Coumadin  Lab Results  Component Value Date   PLT 199 04/21/2015   Lab Results  Component Value Date   INR 1.63* 04/21/2015   INR 1.54* 04/20/2015   INR 1.53* 04/19/2015     Inpatient Medications  Scheduled Meds: . cephALEXin  500 mg Oral 4 times per day  . DULoxetine  20 mg Oral Daily  . furosemide  160 mg Oral BID  . levothyroxine  50 mcg Oral QAC breakfast  . metoprolol tartrate  12.5 mg Oral BID  . mupirocin ointment  1 application Nasal BID  . pantoprazole  40 mg Oral Daily  . sodium chloride  3 mL Intravenous Q12H  . warfarin  5 mg Oral ONCE-1800  . Warfarin - Pharmacist Dosing Inpatient   Does not apply  q1800   Continuous Infusions: . heparin 1,250 Units/hr (04/20/15 2040)   PRN Meds:.acetaminophen **OR** acetaminophen  Antibiotics  :    Anti-infectives    Start     Dose/Rate Route Frequency Ordered Stop   04/17/15 1530  cephALEXin (KEFLEX) capsule 500 mg     500 mg Oral 4 times per day 04/17/15 1413     04/16/15 1800  ceFAZolin (ANCEF) IVPB 1 g/50 mL premix  Status:  Discontinued     1 g 100 mL/hr over 30 Minutes Intravenous 3 times per day 04/16/15 1625 04/17/15 1413   04/16/15 1600  ceFAZolin (ANCEF) IVPB 1 g/50 mL premix  Status:  Discontinued     1 g 100 mL/hr over 30 Minutes Intravenous 3 times per day 04/16/15 1551 04/16/15 1625        Objective:   Filed Vitals:    04/19/15 2215 04/20/15 0922 04/20/15 1346 04/20/15 2058  BP: 109/61 121/55 125/54 121/54  Pulse: 86 80 72 87  Temp: 98.4 F (36.9 C)  98.1 F (36.7 C) 98.2 F (36.8 C)  TempSrc: Oral  Oral Oral  Resp: Height:      Weight:      SpO2: 97%  100% 97%    Wt Readings from Last 3 Encounters:  04/16/15 89.812 kg (198 lb)  03/27/15 90.175 kg (198 lb 12.8 oz)  03/17/15 93.622 kg (206 lb 6.4 oz)     Intake/Output Summary (Last 24 hours) at 04/21/15 1149 Last data filed at 04/21/15 1610  Gross per 24 hour  Intake    200 ml  Output   1695 ml  Net  -1495 ml     Physical Exam  Awake , mildly confused, No new F.N deficits, Normal affect [place].AT,PERRAL Supple Neck,No JVD, No cervical lymphadenopathy appriciated.  Symmetrical Chest wall movement, Good air movement bilaterally, CTAB RRR,No Gallops,Rubs or new Murmurs, No Parasternal Heave +ve B.Sounds, Abd Soft, No tenderness, No organomegaly appriciated, No rebound - guarding or rigidity. No Cyanosis, Clubbing or edema, No new Rash or bruise       Data Review:   Micro Results Recent Results (from the past 240 hour(s))  Wet prep, genital     Status: Abnormal   Collection Time: 04/14/15  4:05 PM  Result Value Ref Range Status   Yeast Wet Prep HPF POC NONE SEEN NONE SEEN Final   Trich, Wet Prep NONE SEEN NONE SEEN Final   Clue Cells Wet Prep HPF POC NONE SEEN NONE SEEN Final   WBC, Wet Prep HPF POC FEW (A) NONE SEEN Final    Comment: FEW BACTERIA SEEN   Sperm NONE SEEN  Final  MRSA PCR Screening     Status: Abnormal   Collection Time: 04/17/15  3:20 AM  Result Value Ref Range Status   MRSA by PCR POSITIVE (A) NEGATIVE Final    Comment:        The GeneXpert MRSA Assay (FDA approved for NASAL specimens only), is one component of a comprehensive MRSA colonization surveillance program. It is not intended to diagnose MRSA infection nor to guide or monitor treatment for MRSA infections. RESULT CALLED TO, READ BACK  BY AND VERIFIED WITH: KRISTEN  04/17/15 MKELLY   Urine culture     Status: None   Collection Time: 04/18/15  4:46 PM  Result Value Ref Range Status   Specimen Description URINE, CLEAN CATCH  Final   Special Requests NONE  Final   Culture NO GROWTH 2  DAYS  Final   Report Status 04/20/2015 FINAL  Final    Radiology Reports US Transvaginal Non-ob  04/14/2015  CLINICAL DATA:  Postmenopausal bleeding. EXAM: TRANSABDOMINAL AND TRANSVAGINAL ULTRASOUND OF PELVIS TECHNIQUE: Both transabdominal and transvaginal ultrasound examinations of the pelvis were performed. Transabdominal technique was performed for global imaging of the pelvis including uterus, ovaries, adnexal regions, and pelvic cul-de-sac. It was necessary to proceed with endovaginal exam following the transabdominal exam to visualize the uterus, endometrium and adnexa. COMPARISON:  None FINDINGS: Uterus Measurements: 5.8 x 2.4 x 3.6 cm. Calcifications noted peripherally. Endometrium Thickness: 2.6 mm.  No focal abnormality visualized. Right ovary Measurements: Not visualized. No adnexal mass noted. Left ovary Measurements: Not visualize. No adnexal mass noted. Other findings No abnormal free fluid. IMPRESSION: 1. In the setting of post-menopausal bleeding, this is consistent with a benign etiology such as endometrial atrophy. If bleeding remains unresponsive to hormonal or medical therapy, sonohysterogram should be considered for focal lesion work-up. (Ref: Radiological Reasoning: Algorithmic Workup of Abnormal Vaginal Bleeding with Endovaginal Sonography and Sonohysterography. AJR 2008; 612:A44-97) Electronically Signed   By: Signa Kell M.D.   On: 04/14/2015 15:22   US Pelvis Complete  04/14/2015  CLINICAL DATA:  Postmenopausal bleeding. EXAM: TRANSABDOMINAL AND TRANSVAGINAL ULTRASOUND OF PELVIS TECHNIQUE: Both transabdominal and transvaginal ultrasound examinations of the pelvis were performed. Transabdominal technique was performed  for global imaging of the pelvis including uterus, ovaries, adnexal regions, and pelvic cul-de-sac. It was necessary to proceed with endovaginal exam following the transabdominal exam to visualize the uterus, endometrium and adnexa. COMPARISON:  None FINDINGS: Uterus Measurements: 5.8 x 2.4 x 3.6 cm. Calcifications noted peripherally. Endometrium Thickness: 2.6 mm.  No focal abnormality visualized. Right ovary Measurements: Not visualized. No adnexal mass noted. Left ovary Measurements: Not visualize. No adnexal mass noted. Other findings No abnormal free fluid. IMPRESSION: 1. In the setting of post-menopausal bleeding, this is consistent with a benign etiology such as endometrial atrophy. If bleeding remains unresponsive to hormonal or medical therapy, sonohysterogram should be considered for focal lesion work-up. (Ref: Radiological Reasoning: Algorithmic Workup of Abnormal Vaginal Bleeding with Endovaginal Sonography and Sonohysterography. AJR 2008; 530:Y51-10) Electronically Signed   By: Signa Kell M.D.   On: 04/14/2015 15:22     CBC  Recent Labs Lab 04/16/15 1330  04/17/15 0547 04/18/15 0521 04/19/15 0631 04/20/15 0620 04/21/15 0643  WBC 4.2  < > 3.4* 4.1 4.6 4.9 4.2  HGB 8.9*  < > 8.5* 9.1* 8.9* 8.9* 8.2*  HCT 29.1*  < > 27.8* 30.7* 30.7* 29.5* 27.6*  PLT 201  < > 200 216 233 218 199  MCV 92.4  < > 92.1 93.3 92.7 91.9 91.7  MCH 28.3  < > 28.1 27.7 26.9 27.7 27.2  MCHC 30.6  < > 30.6 29.6* 29.0* 30.2 29.7*  RDW 16.0*  < > 16.0* 16.0* 16.0* 15.8* 15.9*  LYMPHSABS 1.0  --   --   --   --   --   --   MONOABS 0.3  --   --   --   --   --   --   EOSABS 0.3  --   --   --   --   --   --   BASOSABS 0.0  --   --   --   --   --   --   < > = values in this interval not displayed.  Chemistries   Recent Labs Lab 04/16/15 1334 04/17/15 0547 04/18/15 2111  NA 141 143 143  K 3.8 3.1* 3.7  CL 102 105 106  CO2  --  28 25  GLUCOSE 101* 93 97  BUN 28* 20 15  CREATININE 1.80* 1.60* 1.55*   CALCIUM  --  8.6* 9.0  MG  --   --  2.3  AST  --  17  --   ALT  --  9*  --   ALKPHOS  --  63  --   BILITOT  --  0.9  --    ------------------------------------------------------------------------------------------------------------------ estimated creatinine clearance is 34.7 mL/min (by C-G formula based on Cr of 1.55). ------------------------------------------------------------------------------------------------------------------ No results for input(s): HGBA1C in the last 72 hours. ------------------------------------------------------------------------------------------------------------------ No results for input(s): CHOL, HDL, LDLCALC, TRIG, CHOLHDL, LDLDIRECT in the last 72 hours. ------------------------------------------------------------------------------------------------------------------ No results for input(s): TSH, T4TOTAL, T3FREE, THYROIDAB in the last 72 hours.  Invalid input(s): FREET3 ------------------------------------------------------------------------------------------------------------------ No results for input(s): VITAMINB12, FOLATE, FERRITIN, TIBC, IRON, RETICCTPCT in the last 72 hours.  Coagulation profile  Recent Labs Lab 04/17/15 0547 04/18/15 0530 04/19/15 0631 04/20/15 0620 04/21/15 0643  INR 1.58* 1.42 1.53* 1.54* 1.63*    No results for input(s): DDIMER in the last 72 hours.  Cardiac Enzymes No results for input(s): CKMB, TROPONINI, MYOGLOBIN in the last 168 hours.  Invalid input(s): CK ------------------------------------------------------------------------------------------------------------------ Invalid input(s): POCBNP   Time Spent in minutes  35   Semiyah Newgent K M.D on 04/21/2015 at 11:49 AM  Between 7am to 7pm - Pager - [phone]  After 7pm go to [email or web address] - password [facility]  [facility] -  Office  [phone]

## 2015-04-21 NOTE — Progress Notes (Signed)
ANTICOAGULATION CONSULT NOTE  Pharmacy Consult for heparin and coumadin Indication: atrial fibrillation  No Known Allergies  Patient Measurements: Height: 5\' 5"  (165.1 cm) Weight: 198 lb (89.812 kg) IBW/kg (Calculated) : 57 Heparin Dosing Weight: 77 kg  Vital Signs:    Labs:  Recent Labs  04/19/15 0631 04/20/15 0620 04/21/15 0643  HGB 8.9* 8.9* 8.2*  HCT 30.7* 29.5* 27.6*  PLT 233 218 199  LABPROT 18.4* 18.6* 19.3*  INR 1.53* 1.54* 1.63*  HEPARINUNFRC 0.40 0.39 0.43    Estimated Creatinine Clearance: 34.7 mL/min (by C-G formula based on Cr of 1.55).   Medical History: Past Medical History  Diagnosis Date  . NICM (nonischemic cardiomyopathy) (HCC)     EF 35%; cath 2/12 no CAD  . Systolic CHF, chronic (HCC)   . Severe mitral regurgitation     s/p complex mitral valve repair with cox maze + LAA clipping, removal of RV lead, and implantation of CRT-D in abdomen  . Cardiac arrest - ventricular fibrillation     in 1990s  . Atrial fibrillation (HCC)     amiodarone  . HTN (hypertension)   . HLD (hyperlipidemia)   . Achalasia     s/p dilation  . Recurrent UTI   . Obesity   . Anoxic brain injury (HCC) 1996    s/p cardiac arrest; "in a coma for 16 days"  . Automatic implantable cardioverter-defibrillator in situ   . Hypothyroidism   . TIA (transient ischemic attack)   . Chronic kidney disease (CKD), stage IV (severe) (HCC)     [name] 10/03/2013    Medications:  Scheduled:  . cephALEXin  500 mg Oral 4 times per day  . DULoxetine  20 mg Oral Daily  . furosemide  160 mg Oral BID  . levothyroxine  50 mcg Oral QAC breakfast  . metoprolol tartrate  12.5 mg Oral BID  . mupirocin ointment  1 application Nasal BID  . pantoprazole  40 mg Oral Daily  . sodium chloride  3 mL Intravenous Q12H  . Warfarin - Pharmacist Dosing Inpatient   Does not apply q1800   Infusions:  . heparin 1,250 Units/hr (04/20/15 2040)    Assessment: 74 yo female with afib will be started  back on coumadin while bridging with heparin (no bolus).  PTA Coumadin was 2mg  daily and came in subtherapeutic at 1.58. CHADVs score is 6.  Patient came in with rectal/vaginal bleeding which team thinks could be diverticular vs internal hemorrhoids.  Seen by GI and underwent flexible sigmoidoscopy on 04/17/2015 with no evidence of active bleed. HL remains therapeutic at 0.43 this am. Has had 3 coumadin doses with an increase to 5mg  yesterday. INR has not changed much but is starting to trend upwards to 1.63. Hgb low but stable at 8.2, plts wnl. No s/s of current bleed.  Goal of Therapy:  Heparin level 0.3-0.7 units/ml; INR 2-3 Monitor platelets by anticoagulation protocol: Yes   Plan:  Continue heparin gtt at 1250 units/hr Give coumadin 5mg  PO x 1 tonight Monitor daily INR, HL, CBC, s/s of bleed Plan to d/c patient once INR >2  Enzo Bi, PharmD, BCPS Clinical Pharmacist Pager [phone] 04/21/2015 9:01 AM

## 2015-04-22 LAB — CBC
HCT: 29.4 % — ABNORMAL LOW (ref 36.0–46.0)
Hemoglobin: 8.6 g/dL — ABNORMAL LOW (ref 12.0–15.0)
MCH: 26.8 pg (ref 26.0–34.0)
MCHC: 29.3 g/dL — AB (ref 30.0–36.0)
MCV: 91.6 fL (ref 78.0–100.0)
PLATELETS: 222 10*3/uL (ref 150–400)
RBC: 3.21 MIL/uL — AB (ref 3.87–5.11)
RDW: 15.8 % — ABNORMAL HIGH (ref 11.5–15.5)
WBC: 4.8 10*3/uL (ref 4.0–10.5)

## 2015-04-22 LAB — PROTIME-INR
INR: 1.82 — ABNORMAL HIGH (ref 0.00–1.49)
Prothrombin Time: 21 seconds — ABNORMAL HIGH (ref 11.6–15.2)

## 2015-04-22 LAB — HEPARIN LEVEL (UNFRACTIONATED): Heparin Unfractionated: 0.34 IU/mL (ref 0.30–0.70)

## 2015-04-22 MED ORDER — WARFARIN SODIUM 5 MG PO TABS
5.0000 mg | ORAL_TABLET | Freq: Once | ORAL | Status: AC
  Administered 2015-04-22: 5 mg via ORAL
  Filled 2015-04-22: qty 1

## 2015-04-22 MED ORDER — WARFARIN SODIUM 5 MG PO TABS: 5.0000 mg | ORAL_TABLET | Freq: Once | ORAL | Status: DC

## 2015-04-22 NOTE — Progress Notes (Signed)
ANTICOAGULATION CONSULT NOTE  Pharmacy Consult for heparin and coumadin Indication: atrial fibrillation  No Known Allergies  Patient Measurements: Height: 5\' 5"  (165.1 cm) Weight: 198 lb (89.812 kg) IBW/kg (Calculated) : 57 Heparin Dosing Weight: 77 kg  Vital Signs: Temp: 98.7 F (37.1 C) (12/28 0635) Temp Source: Oral (12/28 0635) BP: 112/45 mmHg (12/28 0635) Pulse Rate: 84 (12/28 0635)  Labs:  Recent Labs  04/20/15 0620 04/21/15 0643 04/22/15 0538  HGB 8.9* 8.2* 8.6*  HCT 29.5* 27.6* 29.4*  PLT 218 199 222  LABPROT 18.6* 19.3* 21.0*  INR 1.54* 1.63* 1.82*  HEPARINUNFRC 0.39 0.43 0.34    Estimated Creatinine Clearance: 34.7 mL/min (by C-G formula based on Cr of 1.55).   Medical History: Past Medical History  Diagnosis Date  . NICM (nonischemic cardiomyopathy) (HCC)     EF 35%; cath 2/12 no CAD  . Systolic CHF, chronic (HCC)   . Severe mitral regurgitation     s/p complex mitral valve repair with cox maze + LAA clipping, removal of RV lead, and implantation of CRT-D in abdomen  . Cardiac arrest - ventricular fibrillation     in 1990s  . Atrial fibrillation (HCC)     amiodarone  . HTN (hypertension)   . HLD (hyperlipidemia)   . Achalasia     s/p dilation  . Recurrent UTI   . Obesity   . Anoxic brain injury (HCC) 1996    s/p cardiac arrest; "in a coma for 16 days"  . Automatic implantable cardioverter-defibrillator in situ   . Hypothyroidism   . TIA (transient ischemic attack)   . Chronic kidney disease (CKD), stage IV (severe) (HCC)     [name] 10/03/2013    Medications:  Scheduled:  . cephALEXin  500 mg Oral 4 times per day  . DULoxetine  20 mg Oral Daily  . furosemide  160 mg Oral BID  . levothyroxine  50 mcg Oral QAC breakfast  . metoprolol tartrate  12.5 mg Oral BID  . mupirocin ointment  1 application Nasal BID  . pantoprazole  40 mg Oral Daily  . sodium chloride  3 mL Intravenous Q12H  . warfarin  5 mg Oral ONCE-1800  . Warfarin -  Pharmacist Dosing Inpatient   Does not apply q1800   Infusions:  . heparin 1,250 Units/hr (04/22/15 0757)    Assessment: 74 yo female with afib will be started back on coumadin while bridging with heparin (no bolus).  PTA Coumadin was 2mg  daily and came in subtherapeutic at 1.58. CHADVs score is 6.  Patient came in with rectal/vaginal bleeding which team thinks could be diverticular vs internal hemorrhoids.  Seen by GI and underwent flexible sigmoidoscopy on 04/17/2015 with no evidence of active bleed. HL remains therapeutic at 0.34 this am. Has had 4 coumadin doses with an increase to 5mg  x 2. INR did not change much over first few days but is starting to trend upwards to 1.82. Hgb low but stable at 8.6, plts wnl. No s/s of current bleed.  Goal of Therapy:  Heparin level 0.3-0.7 units/ml; INR 2-3 Monitor platelets by anticoagulation protocol: Yes   Plan:  Continue heparin gtt at 1250 units/hr Give coumadin 5mg  PO x 1 tonight Monitor daily INR, HL, CBC, s/s of bleed Plan to d/c patient once INR >2  Enzo Bi, PharmD, BCPS Clinical Pharmacist Pager [phone] 04/22/2015 8:16 AM

## 2015-04-22 NOTE — Care Management Note (Signed)
Case Management Note  Patient Details  Name: LALAINE SCHICKER MRN: [id number] Date of Birth: 07/18/1940  Subjective/Objective:                 Patient admitted from [facility] for rectal bleeding. Will DC back to [facility]. No CM needs identified.    Action/Plan:   DC today to SNF as facilitated through CSW.  Expected Discharge Date:                  Expected Discharge Plan:  Skilled Nursing Facility  In-House Referral:  Clinical Social Work  Discharge planning Services  CM Consult  Post Acute Care Choice:    Choice offered to:     DME Arranged:    DME Agency:     HH Arranged:    HH Agency:     Status of Service:  Completed, signed off  Medicare Important Message Given:  Yes Date Medicare IM Given:    Medicare IM give by:    Date Additional Medicare IM Given:    Additional Medicare Important Message give by:     If discussed at Long Length of Stay Meetings, dates discussed:    Additional Comments:  Lawerance Sabal, RN 04/22/2015, 10:45 AM

## 2015-04-22 NOTE — Progress Notes (Signed)
Patient will DC to: [name] Anticipated DC date: 04/22/15 Family notified: Husband Transport by: [facility]  CSW signing off.  Cristobal Goldmann, [place] Clinical Social Worker [phone]

## 2015-04-22 NOTE — Progress Notes (Signed)
Nsg Discharge Note  Admit Date:  04/16/2015 Discharge date: 04/22/2015   Vaughan Sine to be D/C'd Skilled nursing facility per MD order.  AVS completed.  Copy for chart, and copy for patient signed, and dated. Patient/caregiver able to verbalize understanding.  Discharge Medication:   Medication List    STOP taking these medications        cephALEXin 500 MG capsule  Commonly known as:  KEFLEX      TAKE these medications        acetaminophen 500 MG tablet  Commonly known as:  TYLENOL  Take 500 mg by mouth at bedtime.     DECUBI-VITE PO  Take by mouth daily. For wound healing     DULoxetine 20 MG capsule  Commonly known as:  CYMBALTA  Take 20 mg by mouth daily. For depression     fluticasone 50 MCG/ACT nasal spray  Commonly known as:  FLONASE  Place 2 sprays into both nostrils at bedtime. For allergies     folic acid 1 MG tablet  Commonly known as:  FOLVITE  Take 1 tablet by mouth daily.     furosemide 80 MG tablet  Commonly known as:  LASIX  Take 160 mg by mouth 2 (two) times daily. Take 2 tabs = 160 mg PO BID     levothyroxine 50 MCG tablet  Commonly known as:  SYNTHROID, LEVOTHROID  Take 50 mcg by mouth daily before breakfast.     loratadine 10 MG tablet  Commonly known as:  CLARITIN  Take 10 mg by mouth daily.     Melatonin 5 MG Tabs  Take 5 mg by mouth at bedtime.     metoprolol tartrate 25 MG tablet  Commonly known as:  LOPRESSOR  Take 12.5 mg by mouth 2 (two) times daily.     MIRALAX PO  Take 17 g by mouth 2 (two) times daily as needed.     MYRBETRIQ 50 MG Tb24 tablet  Generic drug:  mirabegron ER  Take 50 mg by mouth daily.     potassium chloride SA 20 MEQ tablet  Commonly known as:  K-DUR,KLOR-CON  Take 40 mEq by mouth 2 (two) times daily.     PROCEL 100 PO  Take 2 scoop by mouth 2 (two) times daily.     saccharomyces boulardii 250 MG capsule  Commonly known as:  FLORASTOR  Take 250 mg by mouth 2 (two) times daily.     senna-docusate 8.6-50 MG tablet  Commonly known as:  Senokot-S  Take 1 tablet by mouth at bedtime as needed for mild constipation.     warfarin 2 MG tablet  Commonly known as:  COUMADIN  Take 2 mg by mouth daily.        Discharge Assessment: Filed Vitals:   04/21/15 2105 04/22/15 0635  BP: 109/43 112/45  Pulse: 81 84  Temp: 98 F (36.7 C) 98.7 F (37.1 C)  Resp: 20 18   Skin clean, dry and intact without evidence of skin break down, no evidence of skin tears noted. IV catheter discontinued intact. Site without signs and symptoms of complications - no redness or edema noted at insertion site, patient denies c/o pain - only slight tenderness at site.  Dressing with slight pressure applied.  D/c Instructions-Education: Discharge instructions given to patient/family with verbalized understanding. D/c education completed with patient/family including follow up instructions, medication list, d/c activities limitations if indicated, with other d/c instructions as indicated by MD - patient able to  verbalize understanding, all questions fully answered. Patient instructed to return to ED, call 911, or call MD for any changes in condition.  Patient escorted via EMS, report called into [place].  Kern Reap, RN 04/22/2015 1:59 PM

## 2015-04-22 NOTE — Discharge Summary (Signed)
Margaret [name], is a 74 y.o. female  DOB 1940/09/06  MRN [id number].  Admission date:  04/16/2015  Admitting Physician  No admitting provider for patient encounter.  Discharge Date:  04/22/2015   Primary MD  [name], MD  Recommendations for primary care physician for things to follow:   Monitor INR closely, may require higher than home dose Coumadin for the last few days.  Needs outpatient follow-up with OB for possible Vaginal bleed.   Admission Diagnosis  Rectal bleeding [K62.5]   Discharge Diagnosis  Rectal bleeding [K62.5]    Principal Problem:   Rectal bleeding Active Problems:   Automatic implantable cardioverter-defibrillator in situ   CKD (chronic kidney disease), stage III   Hypertension   Hyperlipidemia   Hypothyroidism   Cellulitis of right leg   Overactive bladder/chronic foley   Nonischemic cardiomyopathy (HCC)   Chronic atrial fibrillation [facility])      Past Medical History  Diagnosis Date  . NICM (nonischemic cardiomyopathy) (HCC)     EF 35%; cath 2/12 no CAD  . Systolic CHF, chronic (HCC)   . Severe mitral regurgitation     s/p complex mitral valve repair with cox maze + LAA clipping, removal of RV lead, and implantation of CRT-D in abdomen  . Cardiac arrest - ventricular fibrillation     in 1990s  . Atrial fibrillation (HCC)     amiodarone  . HTN (hypertension)   . HLD (hyperlipidemia)   . Achalasia     s/p dilation  . Recurrent UTI   . Obesity   . Anoxic brain injury (HCC) 1996    s/p cardiac arrest; "in a coma for 16 days"  . Automatic implantable cardioverter-defibrillator in situ   . Hypothyroidism   . TIA (transient ischemic attack)   . Chronic kidney disease (CKD), stage IV (severe) (HCC)     [name] 10/03/2013    Past Surgical History  Procedure Laterality  Date  . Defibrillator generator explantation and reimplantation; and  08/02/2001  . Device migration with anticipated pocket revision  10/29/2003  . 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 Attempted implantation of an implantable cardioverter-  12/30/2004  . Cardioversion  04/14/2010  . Median sternotomy  07/16/2010  . Mitral valve repair  07/16/2010  . Cox maze procedure (complete biatrial lesion set with clipping of  07/16/2010  . Removal of old endocardial right ventricular defibrillator lead.  07/16/2010  . Placement of dual chamber pacemaker and implantable cardiac  07/16/2010  . Placement of swan ganz pulmonary artery catheter via left femoral access.  07/16/2010  . Laparoscopic cholecystectomy    . Cataract extraction, bilateral    . Cystoscopy w/ stone manipulation    . Cataract extraction, bilateral Bilateral   . Flexible sigmoidoscopy N/A 04/17/2015    Procedure: FLEXIBLE SIGMOIDOSCOPY;  Surgeon: Carman Ching, MD;  Location: [facility] ENDOSCOPY;  Service: Endoscopy;  Laterality: N/A;       HPI  from the history and physical done on the  day of admission:   74 year old female resident of [name] nursing facility who was sent to the ER secondary to complaints of vaginal bleeding. He should have been evaluated at [facility] in MAU on 12/20 for same problem with the transvaginal ultrasound revealing no significant abnormalities. Of note patient has a chronic Foley catheter. Of note the provider at [facility] unable to clearly identify any vaginal bleeding and concerns were the patient was bleeding from the urethra at her catheter site. She was subsequently returned back to the nursing facility. She was sent back to the ER today with reports again of vaginal bleeding. Patient reports large volume blood after using the commode today. She reports the bleeding symptoms as described above have been ongoing for one week. She denies  constipation. Denies NSAIDs as she has not started any new medications. She reports that her cardiologist has stated she is medically unstable from a cardiac standpoint to tolerate a colonoscopy. She reports she was recently started on an antibiotic for right lower extremity cellulitis and has been taking this for 2 days. She denies diarrhea.     Hospital Course:     1. Rectal bleeding. Could be diverticular versus internal hemorrhoids. Was on Coumadin with therapeutic INR, quit in on hold INR around 1.5, no evidence of ongoing bleed, H&H stable with minor blood loss related drop, no need for transfusion. Seen by GI and underwent flexible sigmoidoscopy on 04/17/2015 with no evidence of active bleed. Per GI source of bleeding was likely not GI in origin.  Note patient was evaluated for possible vaginal bleeding at [facility] extensively prior to admission at [facility] and vaginal bleeding was ruled out at [facility]. Must follow with [facility] outpatient post discharge.   2. Cellulitis of the right leg. Appears stable, stop antibiotics.   3. Nonischemic cardiomyopathy with chronic systolic heart failure last EF 30-35% with AICD in place. 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 She appears compensated continue home dose beta blocker and diuretic.    4. Chronic indwelling Foley catheter. No signs of bleeding.   5. Chronic atrial fibrillation [place] Vasc score of 6. Goal will be rate controlled, continue beta blocker, resumed Coumadin was bridged with heparin here, INR is 1.8 and we'll let her go today to SNF, request SNF M.D. to please give Coumadin 4 mg today and then go back to 2 mg daily, monitor INR closely.   6. Hypothyroidism. Currently on Synthroid.   7. Hypokalemia. Replaced and stable   8. Mild dementia. At risk for delirium, minimize narcotics and benzodiazepines.       Discharge Condition: Fair  Follow UP  Follow-up Information    Follow up with  [facility], MAHIMA, MD. Schedule an appointment as soon as possible for a visit in 1 week.   Specialty:  Internal Medicine   Contact information:   [address] [place] [address] [phone]       Follow up with [facility].   Specialty:  Skilled Nursing Facility   Contact information:   [address] [place] [address] [phone]       Consults obtained -  GI  Diet and Activity recommendation: See Discharge Instructions below  Discharge Instructions       Discharge Instructions    Diet - low sodium heart healthy    Complete by:  As directed      Discharge instructions    Complete by:  As directed  Follow with Primary MD Glade Lloyd, MAHIMA, MD in 1-2 days   Get CBC, CMP, INR in 1-2 days at [facility].    Activity: As tolerated with Full fall precautions use walker/cane & assistance as needed   Disposition Home     Diet:   Heart Healthy   with feeding assistance and aspiration precautions.  For Heart failure patients - Check your Weight same time everyday, if you gain over 2 pounds, or you develop in leg swelling, experience more shortness of breath or chest pain, call your Primary MD immediately. Follow Cardiac Low Salt Diet and 1.5 lit/day fluid restriction.   On your next visit with your primary care physician please Get Medicines reviewed and adjusted.   Please request your Prim.MD to go over all Hospital Tests and Procedure/Radiological results at the follow up, please get all Hospital records sent to your Prim MD by signing hospital release before you go home.   If you experience worsening of your admission symptoms, develop shortness of breath, life threatening emergency, suicidal or homicidal thoughts you must seek medical attention immediately by calling 911 or calling your MD immediately  if symptoms less severe.  You Must read complete instructions/literature along with all the possible adverse reactions/side effects for all the  Medicines you take and that have been prescribed to you. Take any new Medicines after you have completely understood and accpet all the possible adverse reactions/side effects.   Do not drive, operating heavy machinery, perform activities at heights, swimming or participation in water activities or provide baby sitting services if your were admitted for syncope or siezures until you have seen by Primary MD or a Neurologist and advised to do so again.  Do not drive when taking Pain medications.    Do not take more than prescribed Pain, Sleep and Anxiety Medications  Special Instructions: If you have smoked or chewed Tobacco  in the last 2 yrs please stop smoking, stop any regular Alcohol  and or any Recreational drug use.  Wear Seat belts while driving.   Please note  You were cared for by a hospitalist during your hospital stay. If you have any questions about your discharge medications or the care you received while you were in the hospital after you are discharged, you can call the unit and asked to speak with the hospitalist on call if the hospitalist that took care of you is not available. Once you are discharged, your primary care physician will handle any further medical issues. Please note that NO REFILLS for any discharge medications will be authorized once you are discharged, as it is imperative that you return to your primary care physician (or establish a relationship with a primary care physician if you do not have one) for your aftercare needs so that they can reassess your need for medications and monitor your lab values.     Discharge instructions    Complete by:  As directed   Follow with Primary MD [name], MD in 1-2 days   Get CBC, CMP, INR in 1-2 days at [facility].    Activity: As tolerated with Full fall precautions use walker/cane & assistance as needed   Disposition Home     Diet:   Heart Healthy   with feeding assistance and aspiration precautions.  For Heart  failure patients - Check your Weight same time everyday, if you gain over 2 pounds, or you develop in leg swelling, experience more shortness of breath or chest pain, call your Primary MD  immediately. Follow Cardiac Low Salt Diet and 1.5 lit/day fluid restriction.   On your next visit with your primary care physician please Get Medicines reviewed and adjusted.   Please request your Prim.MD to go over all Hospital Tests and Procedure/Radiological results at the follow up, please get all Hospital records sent to your Prim MD by signing hospital release before you go home.   If you experience worsening of your admission symptoms, develop shortness of breath, life threatening emergency, suicidal or homicidal thoughts you must seek medical attention immediately by calling 911 or calling your MD immediately  if symptoms less severe.  You Must read complete instructions/literature along with all the possible adverse reactions/side effects for all the Medicines you take and that have been prescribed to you. Take any new Medicines after you have completely understood and accpet all the possible adverse reactions/side effects.   Do not drive, operating heavy machinery, perform activities at heights, swimming or participation in water activities or provide baby sitting services if your were admitted for syncope or siezures until you have seen by Primary MD or a Neurologist and advised to do so again.  Do not drive when taking Pain medications.    Do not take more than prescribed Pain, Sleep and Anxiety Medications  Special Instructions: If you have smoked or chewed Tobacco  in the last 2 yrs please stop smoking, stop any regular Alcohol  and or any Recreational drug use.  Wear Seat belts while driving.   Please note  You were cared for by a hospitalist during your hospital stay. If you have any questions about your discharge medications or the care you received while you were in the hospital after  you are discharged, you can call the unit and asked to speak with the hospitalist on call if the hospitalist that took care of you is not available. Once you are discharged, your primary care physician will handle any further medical issues. Please note that NO REFILLS for any discharge medications will be authorized once you are discharged, as it is imperative that you return to your primary care physician (or establish a relationship with a primary care physician if you do not have one) for your aftercare needs so that they can reassess your need for medications and monitor your lab values.     Increase activity slowly    Complete by:  As directed      Increase activity slowly    Complete by:  As directed              Discharge Medications       Medication List    STOP taking these medications        cephALEXin 500 MG capsule  Commonly known as:  KEFLEX      TAKE these medications        acetaminophen 500 MG tablet  Commonly known as:  TYLENOL  Take 500 mg by mouth at bedtime.     DECUBI-VITE PO  Take by mouth daily. For wound healing     DULoxetine 20 MG capsule  Commonly known as:  CYMBALTA  Take 20 mg by mouth daily. For depression     fluticasone 50 MCG/ACT nasal spray  Commonly known as:  FLONASE  Place 2 sprays into both nostrils at bedtime. For allergies     folic acid 1 MG tablet  Commonly known as:  FOLVITE  Take 1 tablet by mouth daily.     furosemide 80 MG tablet  Commonly known as:  LASIX  Take 160 mg by mouth 2 (two) times daily. Take 2 tabs = 160 mg PO BID     levothyroxine 50 MCG tablet  Commonly known as:  SYNTHROID, LEVOTHROID  Take 50 mcg by mouth daily before breakfast.     loratadine 10 MG tablet  Commonly known as:  CLARITIN  Take 10 mg by mouth daily.     Melatonin 5 MG Tabs  Take 5 mg by mouth at bedtime.     metoprolol tartrate 25 MG tablet  Commonly known as:  LOPRESSOR  Take 12.5 mg by mouth 2 (two) times daily.     MIRALAX PO   Take 17 g by mouth 2 (two) times daily as needed.     MYRBETRIQ 50 MG Tb24 tablet  Generic drug:  mirabegron ER  Take 50 mg by mouth daily.     potassium chloride SA 20 MEQ tablet  Commonly known as:  K-DUR,KLOR-CON  Take 40 mEq by mouth 2 (two) times daily.     PROCEL 100 PO  Take 2 scoop by mouth 2 (two) times daily.     saccharomyces boulardii 250 MG capsule  Commonly known as:  FLORASTOR  Take 250 mg by mouth 2 (two) times daily.     senna-docusate 8.6-50 MG tablet  Commonly known as:  Senokot-S  Take 1 tablet by mouth at bedtime as needed for mild constipation.     warfarin 2 MG tablet  Commonly known as:  COUMADIN  Take 2 mg by mouth daily.        Major procedures and Radiology Reports - PLEASE review detailed and final reports for all details, in brief -   Flexible sigmoidoscopy with no evidence of bleeding   US Transvaginal Non-ob  04/14/2015  CLINICAL DATA:  Postmenopausal bleeding. EXAM: TRANSABDOMINAL AND TRANSVAGINAL ULTRASOUND OF PELVIS TECHNIQUE: Both transabdominal and transvaginal ultrasound examinations of the pelvis were performed. Transabdominal technique was performed for global imaging of the pelvis including uterus, ovaries, adnexal regions, and pelvic cul-de-sac. It was necessary to proceed with endovaginal exam following the transabdominal exam to visualize the uterus, endometrium and adnexa. COMPARISON:  None FINDINGS: Uterus Measurements: 5.8 x 2.4 x 3.6 cm. Calcifications noted peripherally. Endometrium Thickness: 2.6 mm.  No focal abnormality visualized. Right ovary Measurements: Not visualized. No adnexal mass noted. Left ovary Measurements: Not visualize. No adnexal mass noted. Other findings No abnormal free fluid. IMPRESSION: 1. In the setting of post-menopausal bleeding, this is consistent with a benign etiology such as endometrial atrophy. If bleeding remains unresponsive to hormonal or medical therapy, sonohysterogram should be considered for  focal lesion work-up. (Ref: Radiological Reasoning: Algorithmic Workup of Abnormal Vaginal Bleeding with Endovaginal Sonography and Sonohysterography. AJR 2008; 161:W96-04) Electronically Signed   By: Signa Kell M.D.   On: 04/14/2015 15:22   US Pelvis Complete  04/14/2015  CLINICAL DATA:  Postmenopausal bleeding. EXAM: TRANSABDOMINAL AND TRANSVAGINAL ULTRASOUND OF PELVIS TECHNIQUE: Both transabdominal and transvaginal ultrasound examinations of the pelvis were performed. Transabdominal technique was performed for global imaging of the pelvis including uterus, ovaries, adnexal regions, and pelvic cul-de-sac. It was necessary to proceed with endovaginal exam following the transabdominal exam to visualize the uterus, endometrium and adnexa. COMPARISON:  None FINDINGS: Uterus Measurements: 5.8 x 2.4 x 3.6 cm. Calcifications noted peripherally. Endometrium Thickness: 2.6 mm.  No focal abnormality visualized. Right ovary Measurements: Not visualized. No adnexal mass noted. Left ovary Measurements: Not visualize. No adnexal mass noted. Other findings No abnormal  free fluid. IMPRESSION: 1. In the setting of post-menopausal bleeding, this is consistent with a benign etiology such as endometrial atrophy. If bleeding remains unresponsive to hormonal or medical therapy, sonohysterogram should be considered for focal lesion work-up. (Ref: Radiological Reasoning: Algorithmic Workup of Abnormal Vaginal Bleeding with Endovaginal Sonography and Sonohysterography. AJR 2008; 102:V25-36) Electronically Signed   By: Signa Kell M.D.   On: 04/14/2015 15:22    Micro Results      Recent Results (from the past 240 hour(s))  Wet prep, genital     Status: Abnormal   Collection Time: 04/14/15  4:05 PM  Result Value Ref Range Status   Yeast Wet Prep HPF POC NONE SEEN NONE SEEN Final   Trich, Wet Prep NONE SEEN NONE SEEN Final   Clue Cells Wet Prep HPF POC NONE SEEN NONE SEEN Final   WBC, Wet Prep HPF POC FEW (A) NONE  SEEN Final    Comment: FEW BACTERIA SEEN   Sperm NONE SEEN  Final  MRSA PCR Screening     Status: Abnormal   Collection Time: 04/17/15  3:20 AM  Result Value Ref Range Status   MRSA by PCR POSITIVE (A) NEGATIVE Final    Comment:        The GeneXpert MRSA Assay (FDA approved for NASAL specimens only), is one component of a comprehensive MRSA colonization surveillance program. It is not intended to diagnose MRSA infection nor to guide or monitor treatment for MRSA infections. RESULT CALLED TO, READ BACK BY AND VERIFIED WITH: KRISTEN  04/17/15 MKELLY   Urine culture     Status: None   Collection Time: 04/18/15  4:46 PM  Result Value Ref Range Status   Specimen Description URINE, CLEAN CATCH  Final   Special Requests NONE  Final   Culture NO GROWTH 2 DAYS  Final   Report Status 04/20/2015 FINAL  Final    Today   Subjective    Margaret [name] today has no headache,no chest abdominal pain,no new weakness tingling or numbness, feels much better   Objective   Blood pressure 112/45, pulse 84, temperature 98.7 F (37.1 C), temperature source Oral, resp. rate 18, height  (1.651 m), weight 89.812 kg (198 lb), SpO2 97 %.   Intake/Output Summary (Last 24 hours) at 04/22/15 0917 Last data filed at 04/22/15 0618  Gross per 24 hour  Intake    120 ml  Output    500 ml  Net   -380 ml    Exam Awake Alert, Oriented x 2, No new F.N deficits, Normal affect [place].AT,PERRAL Supple Neck,No JVD, No cervical lymphadenopathy appriciated.  Symmetrical Chest wall movement, Good air movement bilaterally, CTAB RRR,No Gallops,Rubs or new Murmurs, No Parasternal Heave +ve B.Sounds, Abd Soft, Non tender, No organomegaly appriciated, No rebound -guarding or rigidity. No Cyanosis, Clubbing or edema, No new Rash or bruise   Data Review   CBC w Diff: Lab Results  Component Value Date   WBC 4.8 04/22/2015   WBC 4.3 11/07/2014   HGB 8.6* 04/22/2015   HCT 29.4* 04/22/2015   HCT 33.5*  07/18/2014   PLT 222 04/22/2015   LYMPHOPCT 24 04/16/2015   MONOPCT 7 04/16/2015   EOSPCT 6 04/16/2015   BASOPCT 1 04/16/2015    CMP: Lab Results  Component Value Date   NA 143 04/18/2015   NA 143 11/07/2014   K 3.7 04/18/2015   CL 106 04/18/2015   CO2 25 04/18/2015   BUN 15 04/18/2015   BUN 34*  11/07/2014   CREATININE 1.55* 04/18/2015   CREATININE 1.9* 11/07/2014   GLU 96 11/07/2014   PROT 7.3 04/17/2015   ALBUMIN 2.8* 04/17/2015   BILITOT 0.9 04/17/2015   ALKPHOS 63 04/17/2015   AST 17 04/17/2015   ALT 9* 04/17/2015  .  Lab Results  Component Value Date   INR 1.82* 04/22/2015   INR 1.63* 04/21/2015   INR 1.54* 04/20/2015     Total Time in preparing paper work, data evaluation and todays exam - 35 minutes  Leroy Sea M.D on 04/22/2015 at 9:17 AM  [facility]   Office  [phone]

## 2015-04-22 NOTE — NC FL2 (Signed)
[place] MEDICAID FL2 LEVEL OF CARE SCREENING TOOL     IDENTIFICATION  Patient Name: Margaret [name] Birthdate: 1941-04-06 Sex: female Admission Date (Current Location): 04/16/2015  [facility] [place] Number:  Producer, television/film/video and Address:  [facility] [place], [address] [place], [place] [address]      Provider Number: [id number]  Attending Physician Name and Address:  Leroy Sea, MD  Relative Name and Phone Number:  Lynden Ang, daughter, [phone]    Current Level of Care: Hospital Recommended Level of Care: Skilled Nursing Facility Prior Approval Number:    Date Approved/Denied:   PASRR Number:    Discharge Plan: SNF    Current Diagnoses: Patient Active Problem List   Diagnosis Date Noted  . Rectal bleeding 04/16/2015  . Nonischemic cardiomyopathy (HCC)   . Chronic atrial fibrillation (HCC)   . Overactive bladder/chronic foley 11/06/2014  . Depression 11/06/2014  . Prolonged Q-T interval on ECG 07/17/2014  . Cellulitis of right leg 11/27/2013  . Hypothyroidism 10/23/2013  . History of TIA (transient ischemic attack) withour residua   . Long-term (current) use of anticoagulants   . CKD (chronic kidney disease), stage III   . Hypertension   . Hyperlipidemia   . S/P Maze operation for atrial fibrillation   . S/P mitral valve repair   . Obesity   . Osteoarthritis   . Anoxic brain injury (HCC)   . Personal history of sudden cardiac arrest   . Automatic implantable cardioverter-defibrillator in situ     Orientation RESPIRATION BLADDER Height & Weight    Self, Time, Situation, Place  Normal Continent, Indwelling catheter (urinary catheter)   198 lbs.  BEHAVIORAL SYMPTOMS/MOOD NEUROLOGICAL BOWEL NUTRITION STATUS   (N/A)  (N/A) Continent  (Please See DC Summary)  AMBULATORY STATUS COMMUNICATION OF NEEDS Skin   Extensive Assist Verbally Normal                       Personal Care Assistance Level of Assistance  Bathing,  Feeding, Dressing Bathing Assistance: Maximum assistance Feeding assistance: Independent Dressing Assistance: Limited assistance     Functional Limitations Info             SPECIAL CARE FACTORS FREQUENCY                       Contractures      Additional Factors Info  Isolation Precautions Code Status Info: DNR Allergies Info: NKA     Isolation Precautions Info: MRSA     Current Medications (04/22/2015):  This is the current hospital active medication list Current Facility-Administered Medications  Medication Dose Route Frequency Provider Last Rate Last Dose  . acetaminophen (TYLENOL) tablet 650 mg  650 mg Oral Q6H PRN Russella Dar, NP       Or  . acetaminophen (TYLENOL) suppository 650 mg  650 mg Rectal Q6H PRN Russella Dar, NP      . cephALEXin (KEFLEX) capsule 500 mg  500 mg Oral 4 times per day Leroy Sea, MD   500 mg at 04/22/15 1158  . DULoxetine (CYMBALTA) DR capsule 20 mg  20 mg Oral Daily Leroy Sea, MD   20 mg at 04/22/15 1660  . furosemide (LASIX) tablet 160 mg  160 mg Oral BID Leroy Sea, MD   160 mg at 04/22/15 0757  . heparin ADULT infusion 100 units/mL (25000 units/250 mL)  1,250 Units/hr Intravenous Continuous Haley P  Francoise Schaumann, RPH 12.5 mL/hr at 04/22/15 0757 1,250 Units/hr at 04/22/15 0757  . levothyroxine (SYNTHROID, LEVOTHROID) tablet 50 mcg  50 mcg Oral QAC breakfast Leroy Sea, MD   50 mcg at 04/22/15 0757  . metoprolol tartrate (LOPRESSOR) tablet 12.5 mg  12.5 mg Oral BID Leroy Sea, MD   12.5 mg at 04/22/15 4540  . pantoprazole (PROTONIX) EC tablet 40 mg  40 mg Oral Daily Leroy Sea, MD   40 mg at 04/22/15 9811  . sodium chloride 0.9 % injection 3 mL  3 mL Intravenous Q12H Russella Dar, NP   3 mL at 04/21/15 2231  . Warfarin - Pharmacist Dosing Inpatient   Does not apply [id number] Leroy Sea, MD         Discharge Medications: Please see discharge summary for a list of discharge  medications.  Relevant Imaging Results:  Relevant Lab Results:   Additional Information    Mearl Latin, LCSWA

## 2015-04-23 ENCOUNTER — Non-Acute Institutional Stay (SKILLED_NURSING_FACILITY): Payer: Medicare Other | Admitting: Adult Health

## 2015-04-23 DIAGNOSIS — J309 Allergic rhinitis, unspecified: Secondary | ICD-10-CM

## 2015-04-23 DIAGNOSIS — G3183 Dementia with Lewy bodies: Secondary | ICD-10-CM | POA: Diagnosis not present

## 2015-04-23 DIAGNOSIS — F329 Major depressive disorder, single episode, unspecified: Secondary | ICD-10-CM

## 2015-04-23 DIAGNOSIS — I482 Chronic atrial fibrillation, unspecified: Secondary | ICD-10-CM

## 2015-04-23 DIAGNOSIS — I5022 Chronic systolic (congestive) heart failure: Secondary | ICD-10-CM | POA: Diagnosis not present

## 2015-04-23 DIAGNOSIS — T502X5A Adverse effect of carbonic-anhydrase inhibitors, benzothiadiazides and other diuretics, initial encounter: Secondary | ICD-10-CM

## 2015-04-23 DIAGNOSIS — E876 Hypokalemia: Secondary | ICD-10-CM

## 2015-04-23 DIAGNOSIS — F32A Depression, unspecified: Secondary | ICD-10-CM

## 2015-04-23 DIAGNOSIS — D62 Acute posthemorrhagic anemia: Secondary | ICD-10-CM | POA: Diagnosis not present

## 2015-04-23 DIAGNOSIS — G4700 Insomnia, unspecified: Secondary | ICD-10-CM | POA: Diagnosis not present

## 2015-04-23 DIAGNOSIS — K625 Hemorrhage of anus and rectum: Secondary | ICD-10-CM

## 2015-04-23 DIAGNOSIS — F0280 Dementia in other diseases classified elsewhere without behavioral disturbance: Secondary | ICD-10-CM

## 2015-04-23 DIAGNOSIS — E43 Unspecified severe protein-calorie malnutrition: Secondary | ICD-10-CM

## 2015-04-23 DIAGNOSIS — E039 Hypothyroidism, unspecified: Secondary | ICD-10-CM

## 2015-04-23 DIAGNOSIS — R5381 Other malaise: Secondary | ICD-10-CM | POA: Diagnosis not present

## 2015-04-23 DIAGNOSIS — K5900 Constipation, unspecified: Secondary | ICD-10-CM

## 2015-04-24 ENCOUNTER — Encounter: Payer: Self-pay | Admitting: Adult Health

## 2015-04-24 ENCOUNTER — Non-Acute Institutional Stay (SKILLED_NURSING_FACILITY): Payer: Medicare Other | Admitting: Internal Medicine

## 2015-04-24 DIAGNOSIS — I482 Chronic atrial fibrillation, unspecified: Secondary | ICD-10-CM

## 2015-04-24 DIAGNOSIS — F0390 Unspecified dementia without behavioral disturbance: Secondary | ICD-10-CM | POA: Diagnosis not present

## 2015-04-24 DIAGNOSIS — G4700 Insomnia, unspecified: Secondary | ICD-10-CM

## 2015-04-24 DIAGNOSIS — Z7901 Long term (current) use of anticoagulants: Secondary | ICD-10-CM

## 2015-04-24 DIAGNOSIS — E46 Unspecified protein-calorie malnutrition: Secondary | ICD-10-CM

## 2015-04-24 DIAGNOSIS — N183 Chronic kidney disease, stage 3 unspecified: Secondary | ICD-10-CM

## 2015-04-24 DIAGNOSIS — N939 Abnormal uterine and vaginal bleeding, unspecified: Secondary | ICD-10-CM

## 2015-04-24 DIAGNOSIS — F0393 Unspecified dementia, unspecified severity, with mood disturbance: Secondary | ICD-10-CM

## 2015-04-24 DIAGNOSIS — F329 Major depressive disorder, single episode, unspecified: Secondary | ICD-10-CM

## 2015-04-24 DIAGNOSIS — N3281 Overactive bladder: Secondary | ICD-10-CM | POA: Diagnosis not present

## 2015-04-24 DIAGNOSIS — D62 Acute posthemorrhagic anemia: Secondary | ICD-10-CM

## 2015-04-24 DIAGNOSIS — K625 Hemorrhage of anus and rectum: Secondary | ICD-10-CM

## 2015-04-24 DIAGNOSIS — E039 Hypothyroidism, unspecified: Secondary | ICD-10-CM | POA: Diagnosis not present

## 2015-04-24 DIAGNOSIS — J309 Allergic rhinitis, unspecified: Secondary | ICD-10-CM

## 2015-04-24 DIAGNOSIS — R5381 Other malaise: Secondary | ICD-10-CM | POA: Diagnosis not present

## 2015-04-24 LAB — CBC AND DIFFERENTIAL
HEMATOCRIT: 30 % — AB (ref 36–46)
Hemoglobin: 8.7 g/dL — AB (ref 12.0–16.0)
NEUTROS ABS: 2 /uL
Platelets: 238 10*3/uL (ref 150–399)
WBC: 4.5 10^3/mL

## 2015-04-24 LAB — BASIC METABOLIC PANEL
BUN: 27 mg/dL — AB (ref 4–21)
Creatinine: 1.9 mg/dL — AB (ref 0.5–1.1)
GLUCOSE: 102 mg/dL
POTASSIUM: 4 mmol/L (ref 3.4–5.3)
SODIUM: 140 mmol/L (ref 137–147)

## 2015-04-24 NOTE — Progress Notes (Signed)
Patient ID: Margaret [name], female   DOB: [birth year], 74 y.o.   MRN: [id number]    DATE:  04/23/15  MRN:  [id number]  BIRTHDAY: Feb 16, 1941  Facility:  Nursing Home Location:  [facility]  Nursing Home Room Number: 803-1  LEVEL OF CARE:  SNF (31)  Contact Information    Name Relation Home Work Mobile   [name],Margaret Daughter   [phone]   [name], Margaret) Spouse [phone]  [phone]   [name], Margaret   [phone]       Chief Complaint  Patient presents with  . Hospitalization Follow-up    Physical deconditioning, rectal bleed, chronic systolic heart failure, chronic atrial fibrillation, hypothyroidism, hypokalemia, dementia, depression, allergic rhinitis, insomnia, constipation, anemia and protein calorie malnutrition    HISTORY OF PRESENT ILLNESS:  This is a 74 year old female who has been readmitted to [facility] on 04/22/15 from [facility]. She is a long-term care resident at [facility] but was sent recently to the hospital for vaginal bleeding and came back to [facility]. Transvaginal ultrasound revealed no abnormalities. She was discharged back to [facility] but was sent out again due to vaginal bleeding. She is currently on chronic Coumadin therapy with INR of 1.5. She had flexible sigmoidoscopy on 12/23 with no evidence of active bleed.  She has been readmitted for long-term care.  PAST MEDICAL HISTORY:  Past Medical History  Diagnosis Date  . NICM (nonischemic cardiomyopathy) (HCC)     EF 35%; cath 2/12 no CAD  . Systolic CHF, chronic (HCC)   . Severe mitral regurgitation     s/p complex mitral valve repair with cox maze + LAA clipping, removal of RV lead, and implantation of CRT-D in abdomen  . Cardiac arrest - ventricular fibrillation     in 1990s  . Atrial fibrillation (HCC)     amiodarone  . HTN (hypertension)   . HLD (hyperlipidemia)   . Achalasia     s/p dilation  . Recurrent UTI   . Obesity   . Anoxic brain injury  (HCC) 1996    s/p cardiac arrest; "in a coma for 16 days"  . Automatic implantable cardioverter-defibrillator in situ   . Hypothyroidism   . TIA (transient ischemic attack)   . Chronic kidney disease (CKD), stage IV (severe) (HCC)     Margaret [name] 10/03/2013     CURRENT MEDICATIONS: Reviewed  Patient's Medications  New Prescriptions   No medications on file  Previous Medications   ACETAMINOPHEN (TYLENOL) 500 MG TABLET    Take 500 mg by mouth at bedtime.   DULOXETINE (CYMBALTA) 20 MG CAPSULE    Take 20 mg by mouth daily. For depression   FLUTICASONE (FLONASE) 50 MCG/ACT NASAL SPRAY    Place 2 sprays into both nostrils at bedtime. For allergies   FOLIC ACID (FOLVITE) 1 MG TABLET    Take 1 tablet by mouth daily.   FUROSEMIDE (LASIX) 80 MG TABLET    Take 160 mg by mouth 2 (two) times daily. Take 2 tabs = 160 mg PO BID   LEVOTHYROXINE (SYNTHROID, LEVOTHROID) 50 MCG TABLET    Take 50 mcg by mouth daily before breakfast.   LORATADINE (CLARITIN) 10 MG TABLET    Take 10 mg by mouth daily.   MELATONIN 5 MG TABS    Take 5 mg by mouth at bedtime.    METOPROLOL TARTRATE (LOPRESSOR) 25 MG TABLET    Take 12.5 mg by mouth 2 (two) times daily.    MIRABEGRON  ER (MYRBETRIQ) 50 MG TB24 TABLET    Take 50 mg by mouth daily.   MULTIPLE VITAMINS-MINERALS (DECUBI-VITE PO)    Take by mouth daily. For wound healing   POLYETHYLENE GLYCOL 3350 (MIRALAX PO)    Take 17 g by mouth 2 (two) times daily as needed.   POTASSIUM CHLORIDE SA (K-DUR,KLOR-CON) 20 MEQ TABLET    Take 40 mEq by mouth 2 (two) times daily.   PROTEIN (PROCEL 100 PO)    Take 2 scoop by mouth 2 (two) times daily.   SACCHAROMYCES BOULARDII (FLORASTOR) 250 MG CAPSULE    Take 250 mg by mouth 2 (two) times daily.   SENNA-DOCUSATE (SENOKOT-S) 8.6-50 MG PER TABLET    Take 1 tablet by mouth at bedtime as needed for mild constipation.   WARFARIN (COUMADIN) 2 MG TABLET    Take 2 mg by mouth daily.  Modified Medications   No medications on file  Discontinued  Medications   No medications on file     No Known Allergies   REVIEW OF SYSTEMS:  GENERAL: no change in appetite, no fatigue, no weight changes, no fever, chills or weakness EYES: Denies change in vision, dry eyes, eye pain, itching or discharge EARS: Denies change in hearing, ringing in ears, or earache NOSE: Denies nasal congestion or epistaxis MOUTH and THROAT: Denies oral discomfort, gingival pain or bleeding, pain from teeth or hoarseness   RESPIRATORY: no cough, SOB, DOE, wheezing, hemoptysis CARDIAC: no chest pain, edema or palpitations GI: no abdominal pain, diarrhea, constipation, heart burn, nausea or vomiting GU: Denies dysuria, frequency, hematuria, incontinence, or discharge PSYCHIATRIC: Denies feeling of depression or anxiety. No report of hallucinations, insomnia, paranoia, or agitation   PHYSICAL EXAMINATION  GENERAL APPEARANCE: Well nourished. In no acute distress. Obese HEAD: Normal in size and contour. No evidence of trauma EYES: Lids open and close normally. No blepharitis, entropion or ectropion. PERRL. Conjunctivae are clear and sclerae are white. Lenses are without opacity EARS: Pinnae are normal. Patient hears normal voice tunes of the examiner MOUTH and THROAT: Lips are without lesions. Oral mucosa is moist and without lesions. Tongue is normal in shape, size, and color and without lesions NECK: supple, trachea midline, no neck masses, no thyroid tenderness, no thyromegaly LYMPHATICS: no LAN in the neck, no supraclavicular LAN RESPIRATORY: breathing is even & unlabored, BS CTAB CARDIAC: RRR, no murmur,no extra heart sounds, no edema GI: abdomen soft, normal BS, no masses, no tenderness, no hepatomegaly, no splenomegaly EXTREMITIES:  Able to move 4 extremities; generalized weakness of BLE PSYCHIATRIC: Alert and oriented X 3. Affect and behavior are appropriate  LABS/RADIOLOGY: Labs reviewed: Basic Metabolic Panel:  Recent Labs  95/62/13 1542  12/17/14 0514 12/18/14 0615 04/16/15 1334 04/17/15 0547 04/18/15 0521  NA  --  139 141 141 143 143  K 3.3* 3.7 3.3* 3.8 3.1* 3.7  CL  --  106 105 102 105 106  CO2  --  23 27  --  28 25  GLUCOSE  --  100* 110* 101* 93 97  BUN  --  20 16 28* 20 15  CREATININE  --  1.71* 1.59* 1.80* 1.60* 1.55*  CALCIUM  --  8.3* 8.5*  --  8.6* 9.0  MG 2.2 2.1  --   --   --  2.3   Liver Function Tests:  Recent Labs  12/17/14 0514 12/18/14 0615 04/17/15 0547  AST ALT 16 16 9*  ALKPHOS 74 71 63  BILITOT 0.8  1.0 0.9  PROT 6.5 6.7 7.3  ALBUMIN 2.3* 2.3* 2.8*    Recent Labs  12/17/14 0524  AMMONIA 23   CBC:  Recent Labs  12/17/14 0514 12/18/14 0615  04/16/15 1330  04/20/15 0620 04/21/15 0643 04/22/15 0538  WBC 6.1 6.1  < > 4.2  < > 4.9 4.2 4.8  NEUTROABS 3.9 3.9  --  2.6  --   --   --   --   HGB 8.4* 8.5*  < > 8.9*  < > 8.9* 8.2* 8.6*  HCT 27.7* 27.2*  < > 29.1*  < > 29.5* 27.6* 29.4*  MCV 92.6 93.2  < > 92.4  < > 91.9 91.7 91.6  PLT 149* 160  < > 201  < > 218 199 222  < > = values in this interval not displayed.  Lipid Panel:  Recent Labs  12/17/14 0514  HDL 22*   Cardiac Enzymes:  Recent Labs  12/14/14 2126 12/15/14 0239 12/15/14 0814  TROPONINI 0.06* 0.04* 0.06*   CBG:  Recent Labs  04/20/15 1155  GLUCAP 127*     US Transvaginal Non-ob  04/14/2015  CLINICAL DATA:  Postmenopausal bleeding. EXAM: TRANSABDOMINAL AND TRANSVAGINAL ULTRASOUND OF PELVIS TECHNIQUE: Both transabdominal and transvaginal ultrasound examinations of the pelvis were performed. Transabdominal technique was performed for global imaging of the pelvis including uterus, ovaries, adnexal regions, and pelvic cul-de-sac. It was necessary to proceed with endovaginal exam following the transabdominal exam to visualize the uterus, endometrium and adnexa. COMPARISON:  None FINDINGS: Uterus Measurements: 5.8 x 2.4 x 3.6 cm. Calcifications noted peripherally. Endometrium Thickness: 2.6 mm.   No focal abnormality visualized. Right ovary Measurements: Not visualized. No adnexal mass noted. Left ovary Measurements: Not visualize. No adnexal mass noted. Other findings No abnormal free fluid. IMPRESSION: 1. In the setting of post-menopausal bleeding, this is consistent with a benign etiology such as endometrial atrophy. If bleeding remains unresponsive to hormonal or medical therapy, sonohysterogram should be considered for focal lesion work-up. (Ref: Radiological Reasoning: Algorithmic Workup of Abnormal Vaginal Bleeding with Endovaginal Sonography and Sonohysterography. AJR 2008; 914:N82-95) Electronically Signed   By: Signa Kell M.D.   On: 04/14/2015 15:22   US Pelvis Complete  04/14/2015  CLINICAL DATA:  Postmenopausal bleeding. EXAM: TRANSABDOMINAL AND TRANSVAGINAL ULTRASOUND OF PELVIS TECHNIQUE: Both transabdominal and transvaginal ultrasound examinations of the pelvis were performed. Transabdominal technique was performed for global imaging of the pelvis including uterus, ovaries, adnexal regions, and pelvic cul-de-sac. It was necessary to proceed with endovaginal exam following the transabdominal exam to visualize the uterus, endometrium and adnexa. COMPARISON:  None FINDINGS: Uterus Measurements: 5.8 x 2.4 x 3.6 cm. Calcifications noted peripherally. Endometrium Thickness: 2.6 mm.  No focal abnormality visualized. Right ovary Measurements: Not visualized. No adnexal mass noted. Left ovary Measurements: Not visualize. No adnexal mass noted. Other findings No abnormal free fluid. IMPRESSION: 1. In the setting of post-menopausal bleeding, this is consistent with a benign etiology such as endometrial atrophy. If bleeding remains unresponsive to hormonal or medical therapy, sonohysterogram should be considered for focal lesion work-up. (Ref: Radiological Reasoning: Algorithmic Workup of Abnormal Vaginal Bleeding with Endovaginal Sonography and Sonohysterography. AJR 2008; 621:H08-65)  Electronically Signed   By: Signa Kell M.D.   On: 04/14/2015 15:22    ASSESSMENT/PLAN:  Physical deconditioning - for rehabilitation  Rectal bleed - could be diverticular versus hemorrhoids. 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; continue KCl 20 MEQ take 2 tabs = 40 MEQ by mouth twice a day  Chronic atrial fibrillation - rate controlled; continue metoprolol 25 mg take 1/2 tab = 12.5 mg twice a day and Coumadin  Long-term use of anticoagulant - INR 2.0; continue Coumadin 2 mg daily  Dementia - stable  Depression - mood is stable; continue Cymbalta 20 mg daily  Allergic rhinitis - continue Flonase 50 g/ACT 2 sprays into both nostrils daily and loratadine 10 mg daily  Insomnia - continue melatonin 5 mg daily at bedtime  Constipation - continue MiraLAX 17 g twice a day when necessary and senna S1 tab by mouth daily at bedtime when necessary  Anemia, acute blood loss - hemoglobin 8.6; check CBC  Protein calorie malnutrition, severe - albumin 2.8; continue Procel 2 scoops twice a day      Goals of care:  Long-term care    [facility], NP [facility] [phone]

## 2015-04-27 NOTE — Progress Notes (Signed)
Patient ID: Margaret [name], female   DOB: 1941/01/05, 75 y.o.   MRN: [id number]      [facility]  PCP: [name], MD  Code Status: Full Code   No Known Allergies  Chief Complaint  Patient presents with  . Readmit To SNF     HPI:  75 y.o. patient is here for long term care post hospital admission with vaginal bleeding. Her ransvaginal ultrasound revealed no abnormalities. She was discharged back to [facility] but was sent out again due to vaginal bleeding. She had flexible sigmoidoscopy on 12/23 with no evidence of active bleed. Her coumadin was resumed. She has PMH of afib, HTN, TIA, NICM with EF 35% and has ICD in place. She is seen in her room today. She denies any concerns this visit.   Review of Systems:  Constitutional: Negative for fever, chills, diaphoresis.  HENT: Negative for headache, congestion, nasal discharge Eyes: Negative for blurred vision, double vision and discharge.  Respiratory: Negative for cough, shortness of breath and wheezing.   Cardiovascular: Negative for chest pain, palpitations, leg swelling.  Gastrointestinal: Negative for heartburn, nausea, vomiting, abdominal pain. Has regular bowel movement Genitourinary: Negative for dysuria  Musculoskeletal: Negative for falls Skin: Negative for rash.  Neurological: Negative for dizziness Psychiatric/Behavioral: Negative for depression    Past Medical History  Diagnosis Date  . NICM (nonischemic cardiomyopathy) (HCC)     EF 35%; cath 2/12 no CAD  . Systolic CHF, chronic (HCC)   . Severe mitral regurgitation     s/p complex mitral valve repair with cox maze + LAA clipping, removal of RV lead, and implantation of CRT-D in abdomen  . Cardiac arrest - ventricular fibrillation     in 1990s  . Atrial fibrillation (HCC)     amiodarone  . HTN (hypertension)   . HLD (hyperlipidemia)   . Achalasia     s/p dilation  . Recurrent UTI   . Obesity   . Anoxic brain injury (HCC) 1996    s/p  cardiac arrest; "in a coma for 16 days"  . Automatic implantable cardioverter-defibrillator in situ   . Hypothyroidism   . TIA (transient ischemic attack)   . Chronic kidney disease (CKD), stage IV (severe) (HCC)     [name] 10/03/2013   Past Surgical History  Procedure Laterality Date  . Defibrillator generator explantation and reimplantation; and  08/02/2001  . Device migration with anticipated pocket revision  10/29/2003  . 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 Attempted implantation of an implantable cardioverter-  12/30/2004  . Cardioversion  04/14/2010  . Median sternotomy  07/16/2010  . Mitral valve repair  07/16/2010  . Cox maze procedure (complete biatrial lesion set with clipping of  07/16/2010  . Removal of old endocardial right ventricular defibrillator lead.  07/16/2010  . Placement of dual chamber pacemaker and implantable cardiac  07/16/2010  . Placement of swan ganz pulmonary artery catheter via left femoral access.  07/16/2010  . Laparoscopic cholecystectomy    . Cataract extraction, bilateral    . Cystoscopy w/ stone manipulation    . Cataract extraction, bilateral Bilateral   . Flexible sigmoidoscopy N/A 04/17/2015    Procedure: FLEXIBLE SIGMOIDOSCOPY;  Surgeon: Carman Ching, MD;  Location: [facility] ENDOSCOPY;  Service: Endoscopy;  Laterality: N/A;   Social History:   reports that she has never smoked. She has never used smokeless tobacco. She reports that she does not drink alcohol or use  illicit drugs.  Family History  Problem Relation Age of Onset  . Stroke Mother   . Lung cancer Father   . Diabetes Mellitus II Brother     Medications:   Medication List       This list is accurate as of: 04/24/15 11:59 PM.  Always use your most recent med list.               acetaminophen 500 MG tablet  Commonly known as:  TYLENOL  Take 500 mg by mouth at bedtime.     DECUBI-VITE PO  Take by mouth daily. For wound healing       DULoxetine 20 MG capsule  Commonly known as:  CYMBALTA  Take 20 mg by mouth daily. For depression     fluticasone 50 MCG/ACT nasal spray  Commonly known as:  FLONASE  Place 2 sprays into both nostrils at bedtime. For allergies     folic acid 1 MG tablet  Commonly known as:  FOLVITE  Take 1 tablet by mouth daily.     furosemide 80 MG tablet  Commonly known as:  LASIX  Take 160 mg by mouth 2 (two) times daily. Take 2 tabs = 160 mg PO BID     levothyroxine 50 MCG tablet  Commonly known as:  SYNTHROID, LEVOTHROID  Take 50 mcg by mouth daily before breakfast.     loratadine 10 MG tablet  Commonly known as:  CLARITIN  Take 10 mg by mouth daily.     Melatonin 5 MG Tabs  Take 5 mg by mouth at bedtime.     metoprolol tartrate 25 MG tablet  Commonly known as:  LOPRESSOR  Take 12.5 mg by mouth 2 (two) times daily.     MIRALAX PO  Take 17 g by mouth 2 (two) times daily as needed.     MYRBETRIQ 50 MG Tb24 tablet  Generic drug:  mirabegron ER  Take 50 mg by mouth daily.     potassium chloride SA 20 MEQ tablet  Commonly known as:  K-DUR,KLOR-CON  Take 40 mEq by mouth 2 (two) times daily.     PROCEL 100 PO  Take 2 scoop by mouth 2 (two) times daily.     saccharomyces boulardii 250 MG capsule  Commonly known as:  FLORASTOR  Take 250 mg by mouth 2 (two) times daily.     senna-docusate 8.6-50 MG tablet  Commonly known as:  Senokot-S  Take 1 tablet by mouth at bedtime as needed for mild constipation.     warfarin 2 MG tablet  Commonly known as:  COUMADIN  Take 2 mg by mouth daily.         Physical Exam: BP 126/60 mmHg  Pulse 82  Temp(Src) 99 F (37.2 C)  Resp 18  SpO2 99%  General- elderly female, in no acute distress Head- normocephalic, atraumatic Nose- no nasal discharge Throat- moist mucus membrane Eyes- PERRLA, EOMI, no pallor, no icterus Neck- no cervical lymphadenopathy Cardiovascular- normal s1,s2, no murmurs, trace leg edema Respiratory- bilateral  clear to auscultation, no wheeze, no rhonchi, no crackles Abdomen- bowel sounds present, soft, non tender Musculoskeletal- able to move all 4 extremities, generalized weakness  Neurological- no focal deficit, alert and oriented to person, place and time Skin- warm and dry   Labs reviewed: Basic Metabolic Panel:  Recent Labs  40/98/11 1542 12/17/14 0514 12/18/14 0615 04/16/15 1334 04/17/15 0547 04/18/15 0521  NA  --  139 141 141 143 143  K 3.3*  3.7 3.3* 3.8 3.1* 3.7  CL  --  106 105 102 105 106  CO2  --  23 27  --  28 25  GLUCOSE  --  100* 110* 101* 93 97  BUN  --  20 16 28* 20 15  CREATININE  --  1.71* 1.59* 1.80* 1.60* 1.55*  CALCIUM  --  8.3* 8.5*  --  8.6* 9.0  MG 2.2 2.1  --   --   --  2.3   Liver Function Tests:  Recent Labs  12/17/14 0514 12/18/14 0615 04/17/15 0547  AST 22 22 17   ALT 16 16 9*  ALKPHOS 74 71 63  BILITOT 0.8 1.0 0.9  PROT 6.5 6.7 7.3  ALBUMIN 2.3* 2.3* 2.8*   No results for input(s): LIPASE, AMYLASE in the last 8760 hours.  Recent Labs  12/17/14 0524  AMMONIA 23   CBC:  Recent Labs  12/17/14 0514 12/18/14 0615  04/16/15 1330  04/20/15 0620 04/21/15 0643 04/22/15 0538  WBC 6.1 6.1  < > 4.2  < > 4.9 4.2 4.8  NEUTROABS 3.9 3.9  --  2.6  --   --   --   --   HGB 8.4* 8.5*  < > 8.9*  < > 8.9* 8.2* 8.6*  HCT 27.7* 27.2*  < > 29.1*  < > 29.5* 27.6* 29.4*  MCV 92.6 93.2  < > 92.4  < > 91.9 91.7 91.6  PLT 149* 160  < > 201  < > 218 199 222  < > = values in this interval not displayed. Cardiac Enzymes:  Recent Labs  12/14/14 2126 12/15/14 0239 12/15/14 0814  TROPONINI 0.06* 0.04* 0.06*   BNP: Invalid input(s): POCBNP CBG:  Recent Labs  04/20/15 1155  GLUCAP 127*    Radiological Exams: Dg Chest Port 1 View  12/14/2014   CLINICAL DATA:  Nausea and vomiting for 2 days  EXAM: PORTABLE CHEST - 1 VIEW  COMPARISON:  12/17/2013  FINDINGS: Cardiac shadow is mildly enlarged but stable. Multiple leads are again identified over the  cardiac shadow. The lungs are well aerated. Mild vascular congestion is noted. No focal confluent infiltrate is seen.  IMPRESSION: Mild vascular congestion   Electronically Signed   By: Alcide Clever M.D.   On: 12/14/2014 17:15    Assessment/Plan  Physical deconditioning Encouraged to work with therapy team to restore her ADLs. But patient not willing to. She is here for long term care. Continue assistance with ADLs. Fall precautions.   Rectal bleed None at present. Sigmoidoscopy was unrevealing. Monitor cbc  Vaginal bleed Resolved. Monitor clinically and if recurs, will need gyn appointment  Blood loss anemia Monitor cbc  Chronic systolic heart failure  Has AICD. continue metoprolol 12.5 mg bid, lasix 160 mg bid and monitor BMP. Continue kcl supplement  Chronic renal failure Monitor bmp  Chronic atrial fibrillation  continue metoprolol 12.5 mg bid for rate control and coumadin for anticoagulation  Long-term use of anticoagulant  Continue coumadin, monitor inr, goal inr 2-3  Dementia with depression Stable. Continue Cymbalta 20 mg daily  Insomnia Start melatonin 5 mg qhs  Protein calorie malnutrition Monitor po intake and weight. Continue procel supplement  Hypothyroidism  continue Synthroid 50 mcg daily  Overactive bladder continue Myrbetriq 50 mg daily  Allergic rhinitis continue Flonase and loratadine   Goals of care: long term care   Labs/tests ordered: inr, cbc, bmp  Family/ staff Communication: reviewed care plan with patient and nursing supervisor  Blanchie Serve, MD  [facility] [phone] (Monday-Friday 8 am - 5 pm) [phone] (afterhours)

## 2015-05-11 ENCOUNTER — Ambulatory Visit (INDEPENDENT_AMBULATORY_CARE_PROVIDER_SITE_OTHER): Payer: 59 | Admitting: Obstetrics & Gynecology

## 2015-05-11 ENCOUNTER — Encounter: Payer: Self-pay | Admitting: Obstetrics & Gynecology

## 2015-05-11 VITALS — BP 132/63 | HR 70 | Temp 98.6°F

## 2015-05-11 DIAGNOSIS — R319 Hematuria, unspecified: Secondary | ICD-10-CM

## 2015-05-11 DIAGNOSIS — N398 Other specified disorders of urinary system: Secondary | ICD-10-CM | POA: Diagnosis not present

## 2015-05-11 NOTE — Patient Instructions (Signed)
Postmenopausal Bleeding Postmenopausal bleeding is any bleeding a woman has after she has entered into menopause. Menopause is the end of a woman's fertile years. After menopause, a woman no longer ovulates or has menstrual periods.  Postmenopausal bleeding can be caused by various things. Any type of postmenopausal bleeding, even if it appears to be a typical menstrual period, is concerning. This should be evaluated by your health care provider. Any treatment will depend on the cause of the bleeding. HOME CARE INSTRUCTIONS Monitor your condition for any changes. The following actions may help to alleviate any discomfort you are experiencing:  Avoid the use of tampons and douches as directed by your health care provider.  Change your pads frequently.  Get regular pelvic exams and Pap tests.  Keep all follow-up appointments for diagnostic tests as directed by your health care provider. SEEK MEDICAL CARE IF:   Your bleeding lasts more than 1 week.  You have abdominal pain.  You have bleeding with sexual intercourse. SEEK IMMEDIATE MEDICAL CARE IF:   You have a fever, chills, headache, dizziness, muscle aches, and bleeding.  You have severe pain with bleeding.  You are passing blood clots.  You have bleeding and need more than 1 pad an hour.  You feel faint. MAKE SURE YOU:  Understand these instructions.  Will watch your condition.  Will get help right away if you are not doing well or get worse.   This information is not intended to replace advice given to you by your health care provider. Make sure you discuss any questions you have with your health care provider.   Document Released: 07/20/2005 Document Revised: 01/30/2013 Document Reviewed: 11/08/2012 Elsevier Interactive Patient Education 2016 [employer]  

## 2015-05-11 NOTE — Progress Notes (Signed)
Patient ID: Margaret [name], female   DOB: 03/02/1941, 75 y.o.   MRN: [id number] History:  75 y.o. No obstetric history on file. Both pt and her partner report that they do not know why she is here. Upon chart review she was seen on Dec 20 in the MAU for possible PMPB. Her exam and sono were negative.  She reports that at the time there was bleeding form 'somewhere' but, she does not know where. She denies any genital or rectl bleeding since that time.  She did have a flex sig 04/17/2015 which showed no obvious GI bleed.       The following portions of the patient's history were reviewed and updated as appropriate: allergies, current medications, past family history, past medical history, past social history, past surgical history and problem list.  Review of Systems:  Pertinent items are noted in HPI.  Objective:  Physical Exam Blood pressure 132/63, pulse 70, temperature 98.6 F (37 C). Gen: NAD exam deferred   Labs and Imaging US Transvaginal Non-ob  04/14/2015  CLINICAL DATA:  Postmenopausal bleeding. EXAM: TRANSABDOMINAL AND TRANSVAGINAL ULTRASOUND OF PELVIS TECHNIQUE: Both transabdominal and transvaginal ultrasound examinations of the pelvis were performed. Transabdominal technique was performed for global imaging of the pelvis including uterus, ovaries, adnexal regions, and pelvic cul-de-sac. It was necessary to proceed with endovaginal exam following the transabdominal exam to visualize the uterus, endometrium and adnexa. COMPARISON:  None FINDINGS: Uterus Measurements: 5.8 x 2.4 x 3.6 cm. Calcifications noted peripherally. Endometrium Thickness: 2.6 mm.  No focal abnormality visualized. Right ovary Measurements: Not visualized. No adnexal mass noted. Left ovary Measurements: Not visualize. No adnexal mass noted. Other findings No abnormal free fluid. IMPRESSION: 1. In the setting of post-menopausal bleeding, this is consistent with a benign etiology such as endometrial atrophy. If bleeding  remains unresponsive to hormonal or medical therapy, sonohysterogram should be considered for focal lesion work-up. (Ref: Radiological Reasoning: Algorithmic Workup of Abnormal Vaginal Bleeding with Endovaginal Sonography and Sonohysterography. AJR 2008; 233:K12-24) Electronically Signed   By: Signa Kell M.D.   On: 04/14/2015 15:22   US Pelvis Complete  04/14/2015  CLINICAL DATA:  Postmenopausal bleeding. EXAM: TRANSABDOMINAL AND TRANSVAGINAL ULTRASOUND OF PELVIS TECHNIQUE: Both transabdominal and transvaginal ultrasound examinations of the pelvis were performed. Transabdominal technique was performed for global imaging of the pelvis including uterus, ovaries, adnexal regions, and pelvic cul-de-sac. It was necessary to proceed with endovaginal exam following the transabdominal exam to visualize the uterus, endometrium and adnexa. COMPARISON:  None FINDINGS: Uterus Measurements: 5.8 x 2.4 x 3.6 cm. Calcifications noted peripherally. Endometrium Thickness: 2.6 mm.  No focal abnormality visualized. Right ovary Measurements: Not visualized. No adnexal mass noted. Left ovary Measurements: Not visualize. No adnexal mass noted. Other findings No abnormal free fluid. IMPRESSION: 1. In the setting of post-menopausal bleeding, this is consistent with a benign etiology such as endometrial atrophy. If bleeding remains unresponsive to hormonal or medical therapy, sonohysterogram should be considered for focal lesion work-up. (Ref: Radiological Reasoning: Algorithmic Workup of Abnormal Vaginal Bleeding with Endovaginal Sonography and Sonohysterography. AJR 2008; 497:N30-05) Electronically Signed   By: Signa Kell M.D.   On: 04/14/2015 15:22    Assessment & Plan:  Genital bleeding of unknown location- sent to r/o PMPB.  Exam and sono were normal on 04/14/2015.  She has had no further bleeding since that time.   Rec routine f/u in 1 year or sooner if bleeding returns.  Lashawna L. Harraway-Smith, M.D., [facility]

## 2015-05-14 ENCOUNTER — Encounter: Payer: Self-pay | Admitting: *Deleted

## 2015-05-19 ENCOUNTER — Encounter: Payer: Self-pay | Admitting: Adult Health

## 2015-05-19 ENCOUNTER — Non-Acute Institutional Stay (SKILLED_NURSING_FACILITY): Payer: 59 | Admitting: Adult Health

## 2015-05-19 DIAGNOSIS — G4700 Insomnia, unspecified: Secondary | ICD-10-CM

## 2015-05-19 DIAGNOSIS — F329 Major depressive disorder, single episode, unspecified: Secondary | ICD-10-CM

## 2015-05-19 DIAGNOSIS — J309 Allergic rhinitis, unspecified: Secondary | ICD-10-CM | POA: Diagnosis not present

## 2015-05-19 DIAGNOSIS — E039 Hypothyroidism, unspecified: Secondary | ICD-10-CM

## 2015-05-19 DIAGNOSIS — I482 Chronic atrial fibrillation, unspecified: Secondary | ICD-10-CM

## 2015-05-19 DIAGNOSIS — T502X5A Adverse effect of carbonic-anhydrase inhibitors, benzothiadiazides and other diuretics, initial encounter: Secondary | ICD-10-CM

## 2015-05-19 DIAGNOSIS — G3183 Dementia with Lewy bodies: Secondary | ICD-10-CM | POA: Diagnosis not present

## 2015-05-19 DIAGNOSIS — E876 Hypokalemia: Secondary | ICD-10-CM

## 2015-05-19 DIAGNOSIS — K5900 Constipation, unspecified: Secondary | ICD-10-CM | POA: Diagnosis not present

## 2015-05-19 DIAGNOSIS — F0280 Dementia in other diseases classified elsewhere without behavioral disturbance: Secondary | ICD-10-CM | POA: Diagnosis not present

## 2015-05-19 DIAGNOSIS — E46 Unspecified protein-calorie malnutrition: Secondary | ICD-10-CM

## 2015-05-19 DIAGNOSIS — D62 Acute posthemorrhagic anemia: Secondary | ICD-10-CM

## 2015-05-19 NOTE — Progress Notes (Signed)
Patient ID: Margaret [name], female   DOB: 03-09-1941, 75 y.o.   MRN: [id number]    DATE:  05/18/14  MRN:  [id number]  BIRTHDAY: 1940-04-30  Facility:  Nursing Home Location:  [facility]  Nursing Home Room Number: 803-1  LEVEL OF CARE:  SNF [phone])  Contact Information    Name Relation Home Work [place] Daughter [phone]  [phone]   Margaret, [name]) Spouse [phone]  [phone]   Margaret, [name]   [phone]       Chief Complaint  Patient presents with  . Medical Management of Chronic Issues    Chronic systolic heart failure, chronic atrial fibrillation, hypothyroidism, hypokalemia, dementia, depression, allergic rhinitis, insomnia, constipation, anemia and protein calorie malnutrition    HISTORY OF PRESENT ILLNESS:  This is a 75 year old female who is being seen for a routine visit. She is a long-term resident at [facility]. No rectalvaginal bleeding has been noted for the past month. Latest hgb is 8.9, stable and improvement from 8.6. Her heart rate is rate-controlled and currently on Metoprolol and Coumadin. Latest K is 3.6, stable. No SOB. Her CHF is stable.  PAST MEDICAL HISTORY:  Past Medical History  Diagnosis Date  . NICM (nonischemic cardiomyopathy) (HCC)     EF 35%; cath 2/12 no CAD  . Systolic CHF, chronic (HCC)   . Severe mitral regurgitation     s/p complex mitral valve repair with cox maze + LAA clipping, removal of RV lead, and implantation of CRT-D in abdomen  . Cardiac arrest - ventricular fibrillation     in 1990s  . Atrial fibrillation (HCC)     amiodarone  . HTN (hypertension)   . HLD (hyperlipidemia)   . Achalasia     s/p dilation  . Recurrent UTI   . Obesity   . Anoxic brain injury (HCC) 1996    s/p cardiac arrest; "in a coma for 16 days"  . Automatic implantable cardioverter-defibrillator in situ   . Hypothyroidism   . TIA (transient ischemic attack)   . Chronic kidney disease (CKD), stage IV  (severe) (HCC)     Margaret [name] 10/03/2013  . Rectal bleeding   . Vaginal bleeding   . Physical deconditioning   . Acute blood loss anemia   . Protein calorie malnutrition (HCC)   . Allergic rhinitis   . Overactive bladder   . Dementia   . Long term (current) use of anticoagulants      CURRENT MEDICATIONS: Reviewed  Patient's Medications  New Prescriptions   No medications on file  Previous Medications   ACETAMINOPHEN (TYLENOL) 325 MG TABLET    Take 650 mg by mouth every 4 (four) hours as needed for headache (If pain persists >2 days notify MD/NP.).   ACETAMINOPHEN (TYLENOL) 500 MG TABLET    Take 500 mg by mouth at bedtime.   DULOXETINE (CYMBALTA) 20 MG CAPSULE    Take 20 mg by mouth daily. For depression   FLUTICASONE (FLONASE) 50 MCG/ACT NASAL SPRAY    Place 2 sprays into both nostrils at bedtime. For allergies   FOLIC ACID (FOLVITE) 1 MG TABLET    Take 1 tablet by mouth daily.   FUROSEMIDE (LASIX) 80 MG TABLET    Take 160 mg by mouth 2 (two) times daily. Take 2 tabs = 160 mg PO BID   LEVOTHYROXINE (SYNTHROID, LEVOTHROID) 50 MCG TABLET    Take 50 mcg by mouth daily before breakfast.   LORATADINE (CLARITIN) 10 MG TABLET  Take 10 mg by mouth daily.   MELATONIN 5 MG TABS    Take 5 mg by mouth at bedtime.    METOPROLOL TARTRATE (LOPRESSOR) 25 MG TABLET    Take 12.5 mg by mouth 2 (two) times daily.    MIRABEGRON ER (MYRBETRIQ) 50 MG TB24 TABLET    Take 50 mg by mouth daily.   MULTIPLE VITAMINS-MINERALS (DECUBI-VITE PO)    Take by mouth daily. For wound healing   POLYETHYLENE GLYCOL 3350 (MIRALAX PO)    Take 17 g by mouth 2 (two) times daily as needed.   POTASSIUM CHLORIDE SA (K-DUR,KLOR-CON) 20 MEQ TABLET    Take 40 mEq by mouth 2 (two) times daily.   PROTEIN (PROCEL 100 PO)    Take 2 scoop by mouth 2 (two) times daily.   SACCHAROMYCES BOULARDII (FLORASTOR) 250 MG CAPSULE    Take 250 mg by mouth 2 (two) times daily.   SENNA-DOCUSATE (SENOKOT-S) 8.6-50 MG PER TABLET    Take 1 tablet by  mouth at bedtime as needed for mild constipation.   WARFARIN (COUMADIN) 2.5 MG TABLET    Take 2.5 mg by mouth daily.  Modified Medications   No medications on file  Discontinued Medications   WARFARIN (COUMADIN) 2 MG TABLET    Take 2.5 mg by mouth daily.      No Known Allergies   REVIEW OF SYSTEMS:  GENERAL: no change in appetite, no fatigue, no weight changes, no fever, chills or weakness EYES: Denies change in vision, dry eyes, eye pain, itching or discharge EARS: Denies change in hearing, ringing in ears, or earache NOSE: Denies nasal congestion or epistaxis MOUTH and THROAT: Denies oral discomfort, gingival pain or bleeding, pain from teeth or hoarseness   RESPIRATORY: no cough, SOB, DOE, wheezing, hemoptysis CARDIAC: no chest pain, edema or palpitations GI: no abdominal pain, diarrhea, constipation, heart burn, nausea or vomiting GU: Denies dysuria, frequency, hematuria, incontinence, or discharge PSYCHIATRIC: Denies feeling of depression or anxiety. No report of hallucinations, insomnia, paranoia, or agitation   PHYSICAL EXAMINATION  GENERAL APPEARANCE: Well nourished. In no acute distress. Obese HEAD: Normal in size and contour. No evidence of trauma EYES: Lids open and close normally. No blepharitis, entropion or ectropion. PERRL. Conjunctivae are clear and sclerae are white. Lenses are without opacity EARS: Pinnae are normal. Patient hears normal voice tunes of the examiner MOUTH and THROAT: Lips are without lesions. Oral mucosa is moist and without lesions. Tongue is normal in shape, size, and color and without lesions NECK: supple, trachea midline, no neck masses, no thyroid tenderness, no thyromegaly LYMPHATICS: no LAN in the neck, no supraclavicular LAN RESPIRATORY: breathing is even & unlabored, BS CTAB CARDIAC: RRR, no murmur,no extra heart sounds, trace edema on BLE GI: abdomen soft, normal BS, no masses, no tenderness, no hepatomegaly, no  splenomegaly EXTREMITIES:  Able to move 4 extremities; generalized weakness of BLE PSYCHIATRIC: Alert and oriented X 3. Affect and behavior are appropriate  LABS/RADIOLOGY: Labs reviewed: Basic Metabolic Panel:  Recent Labs  89/16/94 1542 12/17/14 0514 12/18/14 0615  04/16/15 1334 04/17/15 0547 04/18/15 0521  NA  --  139 141  < > 141 143 143  K 3.3* 3.7 3.3*  < > 3.8 3.1* 3.7  CL  --  106 105  --  102 105 106  CO2  --  23 27  --   --  28 25  GLUCOSE  --  100* 110*  --  101* 93 97  BUN  --  20 16  < > 28* 20 15  CREATININE  --  1.71* 1.59*  < > 1.80* 1.60* 1.55*  CALCIUM  --  8.3* 8.5*  --   --  8.6* 9.0  MG 2.2 2.1  --   --   --   --  2.3  < > = values in this interval not displayed. Liver Function Tests:  Recent Labs  12/17/14 0514 12/18/14 0615 04/02/15 04/17/15 0547  AST 22 22 16  16 17   ALT 16 16 8  8  9*  ALKPHOS 74 71 76  76 63  BILITOT 0.8 1.0  --  0.9  PROT 6.5 6.7  --  7.3  ALBUMIN 2.3* 2.3*  --  2.8*    Recent Labs  12/17/14 0524  AMMONIA 23   CBC:  Recent Labs  12/17/14 0514 12/18/14 0615  04/16/15 1330  04/20/15 0620 04/21/15 0643 04/22/15 0538  WBC 6.1 6.1  < > 4.2  < > 4.9 4.2 4.8  NEUTROABS 3.9 3.9  --  2.6  --   --   --   --   HGB 8.4* 8.5*  < > 8.9*  < > 8.9* 8.2* 8.6*  HCT 27.7* 27.2*  < > 29.1*  < > 29.5* 27.6* 29.4*  MCV 92.6 93.2  < > 92.4  < > 91.9 91.7 91.6  PLT 149* 160  < > 201  < > 218 199 222  < > = values in this interval not displayed.  Lipid Panel:  Recent Labs  12/17/14 0514  HDL 22*   Cardiac Enzymes:  Recent Labs  12/14/14 2126 12/15/14 0239 12/15/14 0814  TROPONINI 0.06* 0.04* 0.06*   CBG:  Recent Labs  04/20/15 1155  GLUCAP 127*     No results found.  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; continue KCl 20  MEQ take 2 tabs = 40 MEQ by mouth twice a day  Chronic atrial fibrillation - rate controlled; continue metoprolol 25 mg take 1/2 tab = 12.5 mg twice a day and Coumadin  Dementia - stable  Depression - mood is stable; continue Cymbalta 20 mg daily  Allergic rhinitis - continue Flonase 50 g/ACT 2 sprays into both nostrils daily and loratadine 10 mg daily  Insomnia - continue melatonin 5 mg daily at bedtime  Constipation - continue MiraLAX 17 g twice a day when necessary and senna S1 tab by mouth daily at bedtime when necessary  Anemia, acute blood loss - hemoglobin 8.9; check CBC  Protein calorie malnutrition -  Albumin 3.37; decrease  Procel  From 2 scoops  To 1 scoop  twice a day      Goals of care:  Long-term care    [facility], NP [facility] [phone]

## 2015-06-02 ENCOUNTER — Non-Acute Institutional Stay (SKILLED_NURSING_FACILITY): Payer: 59 | Admitting: Adult Health

## 2015-06-02 ENCOUNTER — Encounter: Payer: Self-pay | Admitting: Adult Health

## 2015-06-02 DIAGNOSIS — Z7901 Long term (current) use of anticoagulants: Secondary | ICD-10-CM

## 2015-06-02 DIAGNOSIS — I482 Chronic atrial fibrillation, unspecified: Secondary | ICD-10-CM

## 2015-06-02 NOTE — Progress Notes (Signed)
Patient ID: Margaret [name], female   DOB: [birth year], 75 y.o.   MRN: [id number] Subjective:     Indication: atrial fibrillation Bleeding signs/symptoms: None Thromboembolic signs/symptoms: None  Missed Coumadin doses: None Medication changes: no Dietary changes: no Bacterial/viral infection: no Other concerns: no     Review of Systems A comprehensive review of systems was negative.   Objective:    INR Today: 1.8 Current dose: Coumadin 2..5 mg    Assessment:    Subtherapeutic INR for goal of 2-3   Plan:    1. New dose: Increase Coumadin to 3 mg by mouth daily   2. Next INR: 06/05/15

## 2015-06-03 LAB — CBC AND DIFFERENTIAL
HEMATOCRIT: 30 % — AB (ref 36–46)
HEMOGLOBIN: 8.8 g/dL — AB (ref 12.0–16.0)
Neutrophils Absolute: 2 /uL
Platelets: 234 10*3/uL (ref 150–399)
WBC: 3.5 10*3/mL

## 2015-06-03 LAB — BASIC METABOLIC PANEL
BUN: 22 mg/dL — AB (ref 4–21)
CREATININE: 1.4 mg/dL — AB (ref 0.5–1.1)
Glucose: 99 mg/dL
POTASSIUM: 3.4 mmol/L (ref 3.4–5.3)
SODIUM: 141 mmol/L (ref 137–147)

## 2015-06-05 ENCOUNTER — Encounter: Payer: Self-pay | Admitting: Adult Health

## 2015-06-05 ENCOUNTER — Non-Acute Institutional Stay (SKILLED_NURSING_FACILITY): Payer: 59 | Admitting: Adult Health

## 2015-06-05 DIAGNOSIS — Z7901 Long term (current) use of anticoagulants: Secondary | ICD-10-CM

## 2015-06-05 DIAGNOSIS — I482 Chronic atrial fibrillation, unspecified: Secondary | ICD-10-CM

## 2015-06-05 LAB — BASIC METABOLIC PANEL
BUN: 22 mg/dL — AB (ref 4–21)
CREATININE: 1.5 mg/dL — AB (ref 0.5–1.1)
Glucose: 95 mg/dL
POTASSIUM: 3.9 mmol/L (ref 3.4–5.3)
SODIUM: 141 mmol/L (ref 137–147)

## 2015-06-05 NOTE — Progress Notes (Signed)
Patient ID: Margaret [name], female   DOB: 12/09/1940, 75 y.o.   MRN: [id number] Subjective:     Indication: atrial fibrillation Bleeding signs/symptoms: None Thromboembolic signs/symptoms: None  Missed Coumadin doses: None Medication changes: no Dietary changes: no Bacterial/viral infection: no Other concerns: no  The following portions of the patient's history were reviewed and updated as appropriate: allergies, current medications, past family history, past medical history, past social history, past surgical history and problem list.  Review of Systems A comprehensive review of systems was negative.   Objective:    INR Today: 1.5 Current dose: Coumadin 3 mg    Assessment:    Subtherapeutic INR for goal of 2-3   Plan:    1. New dose: Increase Coumadin 3.5 mg daily   2. Next INR: 06/09/15

## 2015-06-09 ENCOUNTER — Encounter: Payer: Self-pay | Admitting: Adult Health

## 2015-06-09 ENCOUNTER — Non-Acute Institutional Stay (SKILLED_NURSING_FACILITY): Payer: 59 | Admitting: Adult Health

## 2015-06-09 DIAGNOSIS — E039 Hypothyroidism, unspecified: Secondary | ICD-10-CM

## 2015-06-09 DIAGNOSIS — I482 Chronic atrial fibrillation, unspecified: Secondary | ICD-10-CM

## 2015-06-09 DIAGNOSIS — E876 Hypokalemia: Secondary | ICD-10-CM | POA: Diagnosis not present

## 2015-06-09 DIAGNOSIS — D62 Acute posthemorrhagic anemia: Secondary | ICD-10-CM

## 2015-06-09 DIAGNOSIS — G4700 Insomnia, unspecified: Secondary | ICD-10-CM | POA: Diagnosis not present

## 2015-06-09 DIAGNOSIS — G3183 Dementia with Lewy bodies: Secondary | ICD-10-CM

## 2015-06-09 DIAGNOSIS — J309 Allergic rhinitis, unspecified: Secondary | ICD-10-CM

## 2015-06-09 DIAGNOSIS — E46 Unspecified protein-calorie malnutrition: Secondary | ICD-10-CM

## 2015-06-09 DIAGNOSIS — N3281 Overactive bladder: Secondary | ICD-10-CM | POA: Diagnosis not present

## 2015-06-09 DIAGNOSIS — L299 Pruritus, unspecified: Secondary | ICD-10-CM

## 2015-06-09 DIAGNOSIS — T502X5A Adverse effect of carbonic-anhydrase inhibitors, benzothiadiazides and other diuretics, initial encounter: Secondary | ICD-10-CM

## 2015-06-09 DIAGNOSIS — F329 Major depressive disorder, single episode, unspecified: Secondary | ICD-10-CM | POA: Diagnosis not present

## 2015-06-09 DIAGNOSIS — K5900 Constipation, unspecified: Secondary | ICD-10-CM | POA: Diagnosis not present

## 2015-06-09 DIAGNOSIS — F0280 Dementia in other diseases classified elsewhere without behavioral disturbance: Secondary | ICD-10-CM

## 2015-06-09 NOTE — Progress Notes (Signed)
Patient ID: Margaret [name], female   DOB: 09-25-1940, 75 y.o.   MRN: [id number]    DATE:    06/09/15  MRN:  [id number]  BIRTHDAY: 06-22-1940  Facility:  Nursing Home Location:  [facility]  Nursing Home Room Number: 803-1  LEVEL OF CARE:  SNF [phone])  Contact Information    Name Relation Home Work [place] Daughter [phone]  [phone]   Inella, [place]) Spouse [phone]  [phone]   Shizuye, Rupert   [phone]       Chief Complaint  Patient presents with  . Medical Management of Chronic Issues    HISTORY OF PRESENT ILLNESS:  This is a 75 year old female who is being seen for a routine visit. She is a long-term resident at [facility]. She was noted to be scratching her legs leaving bloody streaks. Latest INR 2.22, therapeutic. She is currently on Coumadin 3.5 mg daily for atrial fibrillation. Her HR is rate-controlled. No SOB has been  Reported. Her mood is stable.   PAST MEDICAL HISTORY:  Past Medical History  Diagnosis Date  . NICM (nonischemic cardiomyopathy) (HCC)     EF 35%; cath 2/12 no CAD  . Systolic CHF, chronic (HCC)   . Severe mitral regurgitation     s/p complex mitral valve repair with cox maze + LAA clipping, removal of RV lead, and implantation of CRT-D in abdomen  . Cardiac arrest - ventricular fibrillation     in 1990s  . Atrial fibrillation (HCC)     amiodarone  . HTN (hypertension)   . HLD (hyperlipidemia)   . Achalasia     s/p dilation  . Recurrent UTI   . Obesity   . Anoxic brain injury (HCC) 1996    s/p cardiac arrest; "in a coma for 16 days"  . Automatic implantable cardioverter-defibrillator in situ   . Hypothyroidism   . TIA (transient ischemic attack)   . Chronic kidney disease (CKD), stage IV (severe) (HCC)     [name] 10/03/2013  . Rectal bleeding   . Vaginal bleeding   . Physical deconditioning   . Acute blood loss anemia   . Protein calorie malnutrition (HCC)   . Allergic rhinitis   .  Overactive bladder   . Lewy body dementia without behavioral disturbance   . Long term (current) use of anticoagulants   . Insomnia   . Major depression, chronic (HCC)   . Constipation   . Diuretic-induced hypokalemia      CURRENT MEDICATIONS: Reviewed  Patient's Medications  New Prescriptions   No medications on file  Previous Medications   ACETAMINOPHEN (TYLENOL) 500 MG TABLET    Take 500 mg by mouth at bedtime.   DULOXETINE (CYMBALTA) 20 MG CAPSULE    Take 20 mg by mouth daily. For depression   FLUTICASONE (FLONASE) 50 MCG/ACT NASAL SPRAY    Place 2 sprays into both nostrils at bedtime. For allergies   FOLIC ACID (FOLVITE) 1 MG TABLET    Take 1 tablet by mouth daily.   FUROSEMIDE (LASIX) 80 MG TABLET    Take 160 mg by mouth 2 (two) times daily. Take 2 tabs = 160 mg PO BID   HYDROXYZINE (ATARAX/VISTARIL) 25 MG TABLET    Take 25 mg by mouth 2 (two) times daily as needed for itching.   LEVOTHYROXINE (SYNTHROID, LEVOTHROID) 50 MCG TABLET    Take 50 mcg by mouth daily before breakfast.   LORATADINE (CLARITIN) 10 MG TABLET    Take  10 mg by mouth daily.   MELATONIN 5 MG TABS    Take 5 mg by mouth at bedtime.    METOPROLOL TARTRATE (LOPRESSOR) 25 MG TABLET    Take 12.5 mg by mouth 2 (two) times daily.    MIRABEGRON ER (MYRBETRIQ) 50 MG TB24 TABLET    Take 50 mg by mouth daily.   MULTIPLE VITAMINS-MINERALS (DECUBI-VITE PO)    Take 1 capsule by mouth daily. For wound healing   POLYETHYLENE GLYCOL 3350 (MIRALAX PO)    Take 17 g by mouth 2 (two) times daily as needed.   POTASSIUM CHLORIDE SA (K-DUR,KLOR-CON) 20 MEQ TABLET    Take 40 mEq by mouth 2 (two) times daily.   PROTEIN (PROCEL 100 PO)    Take 1 scoop by mouth 2 (two) times daily.    SACCHAROMYCES BOULARDII (FLORASTOR) 250 MG CAPSULE    Take 250 mg by mouth 2 (two) times daily.   SENNA-DOCUSATE (SENOKOT-S) 8.6-50 MG PER TABLET    Take 1 tablet by mouth at bedtime as needed for mild constipation.   WARFARIN (COUMADIN) 3 MG TABLET    Take  3 mg by mouth daily.  Modified Medications   No medications on file  Discontinued Medications   WARFARIN (COUMADIN) 2.5 MG TABLET    Take 3.5 mg by mouth daily. Take 2.5 mg tablet along with a 1 mg tablet to = 3.5 mg Coumadin QD     No Known Allergies   REVIEW OF SYSTEMS:  GENERAL: no change in appetite, no fatigue, no weight changes, no fever, chills or weakness EYES: Denies change in vision, dry eyes, eye pain, itching or discharge EARS: Denies change in hearing, ringing in ears, or earache NOSE: Denies nasal congestion or epistaxis MOUTH and THROAT: Denies oral discomfort, gingival pain or bleeding, pain from teeth or hoarseness   RESPIRATORY: no cough, SOB, DOE, wheezing, hemoptysis CARDIAC: no chest pain, edema or palpitations GI: no abdominal pain, diarrhea, constipation, heart burn, nausea or vomiting GU: Denies dysuria, frequency, hematuria, incontinence, or discharge PSYCHIATRIC: Denies feeling of depression or anxiety. No report of hallucinations, insomnia, paranoia, or agitation   PHYSICAL EXAMINATION  GENERAL APPEARANCE: Well nourished. In no acute distress. Obese HEAD: Normal in size and contour. No evidence of trauma EYES: Lids open and close normally. No blepharitis, entropion or ectropion. PERRL. Conjunctivae are clear and sclerae are white. Lenses are without opacity EARS: Pinnae are normal. Patient hears normal voice tunes of the examiner MOUTH and THROAT: Lips are without lesions. Oral mucosa is moist and without lesions. Tongue is normal in shape, size, and color and without lesions NECK: supple, trachea midline, no neck masses, no thyroid tenderness, no thyromegaly LYMPHATICS: no LAN in the neck, no supraclavicular LAN RESPIRATORY: breathing is even & unlabored, BS CTAB CARDIAC: RRR, no murmur,no extra heart sounds, trace edema on BLE GI: abdomen soft, normal BS, no masses, no tenderness, no hepatomegaly, no splenomegaly EXTREMITIES:  Able to move 4  extremities; generalized weakness of BLE PSYCHIATRIC: Alert and oriented X 3. Affect and behavior are appropriate  LABS/RADIOLOGY: Labs reviewed: Basic Metabolic Panel:  Recent Labs  21/97/58 1542 12/17/14 0514 12/18/14 0615  04/16/15 1334 04/17/15 0547 04/18/15 0521 04/24/15 06/03/15 06/05/15  NA  --  139 141  < > 141 143 143 140 141 141  K 3.3* 3.7 3.3*  < > 3.8 3.1* 3.7 4.0 3.4 3.9  CL  --  106 105  --  102 105 106  --   --   --  CO2  --  23 27  --   --  28 25  --   --   --   GLUCOSE  --  100* 110*  --  101* 93 97  --   --   --   BUN  --  20 16  < > 28* 20 15 27* 22* 22*  CREATININE  --  1.71* 1.59*  < > 1.80* 1.60* 1.55* 1.9* 1.4* 1.5*  CALCIUM  --  8.3* 8.5*  --   --  8.6* 9.0  --   --   --   MG 2.2 2.1  --   --   --   --  2.3  --   --   --   < > = values in this interval not displayed. Liver Function Tests:  Recent Labs  12/17/14 0514 12/18/14 0615 04/02/15 04/17/15 0547  AST ALT 9*  ALKPHOS 74 71 76  76 63  BILITOT 0.8 1.0  --  0.9  PROT 6.5 6.7  --  7.3  ALBUMIN 2.3* 2.3*  --  2.8*    Recent Labs  12/17/14 0524  AMMONIA 23   CBC:  Recent Labs  04/20/15 0620 04/21/15 0643 04/22/15 0538 04/24/15 06/03/15   WBC 4.9 4.2 4.8 4.5 3.5   NEUTROABS  --   --   --  2 2   HGB 8.9* 8.2* 8.6* 8.7* 8.8*   HCT 29.5* 27.6* 29.4* 30* 30*   MCV 91.9 91.7 91.6  --   --    PLT 218 199 222 238 234    Lipid Panel:  Recent Labs  12/17/14 0514  HDL 22*   Cardiac Enzymes:  Recent Labs  12/14/14 2126 12/15/14 0239 12/15/14 0814  TROPONINI 0.06* 0.04* 0.06*   CBG:  Recent Labs  04/20/15 1155  GLUCAP 127*     ASSESSMENT/PLAN:  Pruritus - start Atarax 25 mg 1 tab PO BID PRN  Insomnia - continue melatonin 5 mg daily at bedtime  Chronic systolic heart failure with AICD - no SOB; continue metoprolol 25 mg 1/2 tab = 12.5 mg twice a day, Lasix 80 mg take 2 tabs = 160 mg twice a day  Hypothyroidism - continue  Levothyroxine 50 mcg 1 tab PO daily  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; continue KCl 20 MEQ take 2 tabs = 40 MEQ by mouth twice a day  Dementia - stable  Depression - mood is stable; continue Cymbalta 20 mg daily  Allergic rhinitis - continue Flonase 50 g/ACT 2 sprays into both nostrils daily and loratadine 10 mg daily  Constipation - continue MiraLAX 17 g twice a day when necessary and senna S1 tab by mouth daily at bedtime when necessary  Anemia, acute blood loss - hemoglobin 8.9; re-check hgb 8.0; continue to monitor  Protein calorie malnutrition -  Albumin 3.37; decrease  Continue Procel  1 scoop PO  twice a day  Chronic atrial fibrillation - rate controlled; continue metoprolol 25 mg take 1/2 tab = 12.5 mg twice a day and Coumadin 3.5 mg daily     Goals of care:  Long-term care    [facility], NP [facility] [phone]

## 2015-06-12 LAB — CBC AND DIFFERENTIAL
HEMATOCRIT: 28 % — AB (ref 36–46)
HEMOGLOBIN: 8 g/dL — AB (ref 12.0–16.0)
NEUTROS ABS: 2 /uL
Platelets: 230 10*3/uL (ref 150–399)
WBC: 4.5 10*3/mL

## 2015-06-30 ENCOUNTER — Encounter: Payer: Self-pay | Admitting: Adult Health

## 2015-06-30 ENCOUNTER — Non-Acute Institutional Stay (SKILLED_NURSING_FACILITY): Payer: 59 | Admitting: Adult Health

## 2015-06-30 DIAGNOSIS — G4700 Insomnia, unspecified: Secondary | ICD-10-CM

## 2015-06-30 DIAGNOSIS — E876 Hypokalemia: Secondary | ICD-10-CM | POA: Diagnosis not present

## 2015-06-30 DIAGNOSIS — D62 Acute posthemorrhagic anemia: Secondary | ICD-10-CM

## 2015-06-30 DIAGNOSIS — L299 Pruritus, unspecified: Secondary | ICD-10-CM | POA: Diagnosis not present

## 2015-06-30 DIAGNOSIS — I5022 Chronic systolic (congestive) heart failure: Secondary | ICD-10-CM | POA: Diagnosis not present

## 2015-06-30 DIAGNOSIS — E46 Unspecified protein-calorie malnutrition: Secondary | ICD-10-CM | POA: Diagnosis not present

## 2015-06-30 DIAGNOSIS — F329 Major depressive disorder, single episode, unspecified: Secondary | ICD-10-CM

## 2015-06-30 DIAGNOSIS — I482 Chronic atrial fibrillation, unspecified: Secondary | ICD-10-CM

## 2015-06-30 DIAGNOSIS — G3183 Dementia with Lewy bodies: Secondary | ICD-10-CM

## 2015-06-30 DIAGNOSIS — E039 Hypothyroidism, unspecified: Secondary | ICD-10-CM | POA: Diagnosis not present

## 2015-06-30 DIAGNOSIS — K5900 Constipation, unspecified: Secondary | ICD-10-CM | POA: Diagnosis not present

## 2015-06-30 DIAGNOSIS — F0280 Dementia in other diseases classified elsewhere without behavioral disturbance: Secondary | ICD-10-CM

## 2015-06-30 DIAGNOSIS — T502X5A Adverse effect of carbonic-anhydrase inhibitors, benzothiadiazides and other diuretics, initial encounter: Secondary | ICD-10-CM

## 2015-06-30 DIAGNOSIS — J309 Allergic rhinitis, unspecified: Secondary | ICD-10-CM | POA: Diagnosis not present

## 2015-06-30 LAB — CBC AND DIFFERENTIAL
HCT: 31 % — AB (ref 36–46)
Hemoglobin: 9.1 g/dL — AB (ref 12.0–16.0)
NEUTROS ABS: 2 /uL
Platelets: 259 10*3/uL (ref 150–399)
WBC: 3.7 10^3/mL

## 2015-06-30 NOTE — Progress Notes (Signed)
Patient ID: Margaret [name], female   DOB: 1940/09/14, 75 y.o.   MRN: [id number]    DATE:    06/30/15  MRN:  [id number]  BIRTHDAY: 04/15/1941  Facility:  Nursing Home Location:  [facility]  Nursing Home Room Number: 803-1  LEVEL OF CARE:  SNF [phone])  Contact Information    Name Relation Home Work [place] Daughter [phone]  [phone]   Gwynn, Chalker) Spouse [phone]  [phone]   Jennene, Downie   [phone]       Chief Complaint  Patient presents with  . Medical Management of Chronic Issues    HISTORY OF PRESENT ILLNESS:  This is a 75 year old female who is being seen for a routine visit. She is a long-term resident at [facility]. Latest hgb 9.1, improved from 8.0.  No SOB has been noted. No complaints of insomnia and currently takes Melatonin 5 mg daily @ HS. Her mood is stable and currently takes Cymbalta for depression.   PAST MEDICAL HISTORY:  Past Medical History  Diagnosis Date  . NICM (nonischemic cardiomyopathy) (HCC)     EF 35%; cath 2/12 no CAD  . Systolic CHF, chronic (HCC)   . Severe mitral regurgitation     s/p complex mitral valve repair with cox maze + LAA clipping, removal of RV lead, and implantation of CRT-D in abdomen  . Cardiac arrest - ventricular fibrillation     in 1990s  . Atrial fibrillation (HCC)     amiodarone  . HTN (hypertension)   . HLD (hyperlipidemia)   . Achalasia     s/p dilation  . Recurrent UTI   . Obesity   . Anoxic brain injury (HCC) 1996    s/p cardiac arrest; "in a coma for 16 days"  . Automatic implantable cardioverter-defibrillator in situ   . Hypothyroidism   . TIA (transient ischemic attack)   . Chronic kidney disease (CKD), stage IV (severe) (HCC)     [name] 10/03/2013  . Rectal bleeding   . Vaginal bleeding   . Physical deconditioning   . Acute blood loss anemia   . Protein calorie malnutrition (HCC)   . Allergic rhinitis   . Overactive bladder   . Lewy body  dementia without behavioral disturbance   . Long term (current) use of anticoagulants   . Insomnia   . Major depression, chronic (HCC)   . Constipation   . Diuretic-induced hypokalemia      CURRENT MEDICATIONS: Reviewed  Patient's Medications  New Prescriptions   No medications on file  Previous Medications   ACETAMINOPHEN (TYLENOL) 500 MG TABLET    Take 500 mg by mouth at bedtime.   DULOXETINE (CYMBALTA) 20 MG CAPSULE    Take 20 mg by mouth daily. For depression   FLUTICASONE (FLONASE) 50 MCG/ACT NASAL SPRAY    Place 2 sprays into both nostrils at bedtime. For allergies   FOLIC ACID (FOLVITE) 1 MG TABLET    Take 1 tablet by mouth daily.   FUROSEMIDE (LASIX) 80 MG TABLET    Take 160 mg by mouth 2 (two) times daily. Take 2 tabs = 160 mg PO BID   HYDROXYZINE (ATARAX/VISTARIL) 25 MG TABLET    Take 25 mg by mouth 2 (two) times daily as needed for itching.   LEVOTHYROXINE (SYNTHROID, LEVOTHROID) 50 MCG TABLET    Take 50 mcg by mouth daily before breakfast.   LORATADINE (CLARITIN) 10 MG TABLET    Take 10 mg by mouth daily.  MELATONIN 5 MG TABS    Take 5 mg by mouth at bedtime.    METOPROLOL TARTRATE (LOPRESSOR) 25 MG TABLET    Take 12.5 mg by mouth 2 (two) times daily.    MIRABEGRON ER (MYRBETRIQ) 50 MG TB24 TABLET    Take 50 mg by mouth daily.   MULTIPLE VITAMINS-MINERALS (DECUBI-VITE PO)    Take 1 capsule by mouth daily. For wound healing   POLYETHYLENE GLYCOL 3350 (MIRALAX PO)    Take 17 g by mouth 2 (two) times daily as needed.   POTASSIUM CHLORIDE SA (K-DUR,KLOR-CON) 20 MEQ TABLET    Take 40 mEq by mouth 2 (two) times daily.   PROTEIN (PROCEL 100 PO)    Take 1 scoop by mouth 2 (two) times daily.    SACCHAROMYCES BOULARDII (FLORASTOR) 250 MG CAPSULE    Take 250 mg by mouth 2 (two) times daily.   SENNA-DOCUSATE (SENOKOT-S) 8.6-50 MG PER TABLET    Take 1 tablet by mouth at bedtime as needed for mild constipation.   WARFARIN (COUMADIN) 3 MG TABLET    Take 3 mg by mouth daily.  Modified  Medications   No medications on file  Discontinued Medications   WARFARIN (COUMADIN) 2.5 MG TABLET    Take 3.5 mg by mouth daily. Take 2.5 mg tablet along with a 1 mg tablet to = 3.5 mg Coumadin QD     No Known Allergies   REVIEW OF SYSTEMS:  GENERAL: no change in appetite, no fatigue, no weight changes, no fever, chills or weakness EYES: Denies change in vision, dry eyes, eye pain, itching or discharge EARS: Denies change in hearing, ringing in ears, or earache NOSE: Denies nasal congestion or epistaxis MOUTH and THROAT: Denies oral discomfort, gingival pain or bleeding, pain from teeth or hoarseness   RESPIRATORY: no cough, SOB, DOE, wheezing, hemoptysis CARDIAC: no chest pain, or palpitations GI: no abdominal pain, diarrhea, constipation, heart burn, nausea or vomiting GU: Denies dysuria, frequency, hematuria, incontinence, or discharge PSYCHIATRIC: Denies feeling of depression or anxiety. No report of hallucinations, insomnia, paranoia, or agitation   PHYSICAL EXAMINATION  GENERAL APPEARANCE: Well nourished. In no acute distress. Obese SKIN:  Skin is warm and dry. HEAD: Normal in size and contour. No evidence of trauma EYES: Lids open and close normally. No blepharitis, entropion or ectropion. PERRL. Conjunctivae are clear and sclerae are white. Lenses are without opacity EARS: Pinnae are normal. Patient hears normal voice tunes of the examiner MOUTH and THROAT: Lips are without lesions. Oral mucosa is moist and without lesions. Tongue is normal in shape, size, and color and without lesions NECK: supple, trachea midline, no neck masses, no thyroid tenderness, no thyromegaly LYMPHATICS: no LAN in the neck, no supraclavicular LAN RESPIRATORY: breathing is even & unlabored, BS CTAB CARDIAC: no murmur,no extra heart sounds, 1+ edema on BLE GI: abdomen soft, normal BS, no masses, no tenderness, no hepatomegaly, no splenomegaly EXTREMITIES:  Able to move 4 extremities; generalized  weakness of BLE PSYCHIATRIC: Alert and oriented X 3. Affect and behavior are appropriate  LABS/RADIOLOGY: Labs reviewed: 06/30/15   Wbc 3.7  hgb 9.1  hct 89.5  MCV 30.8  Platelet 259 Basic Metabolic Panel:  Recent Labs  36/14/43 1542 12/17/14 0514 12/18/14 0615  04/16/15 1334 04/17/15 0547 04/18/15 0521 04/24/15 06/03/15 06/05/15  NA  --  139 141  < > 141 143 143 140 141 141  K 3.3* 3.7 3.3*  < > 3.8 3.1* 3.7 4.0 3.4 3.9  CL  --  106 105  --  102 105 106  --   --   --   CO2  --  23 27  --   --  28 25  --   --   --   GLUCOSE  --  100* 110*  --  101* 93 97  --   --   --   BUN  --  20 16  < > 28* 20 15 27* 22* 22*  CREATININE  --  1.71* 1.59*  < > 1.80* 1.60* 1.55* 1.9* 1.4* 1.5*  CALCIUM  --  8.3* 8.5*  --   --  8.6* 9.0  --   --   --   MG 2.2 2.1  --   --   --   --  2.3  --   --   --   < > = values in this interval not displayed. Liver Function Tests:  Recent Labs  12/17/14 0514 12/18/14 0615 04/02/15 04/17/15 0547  AST 22 22 16  16 17   ALT 16 16 8  8  9*  ALKPHOS 74 71 76  76 63  BILITOT 0.8 1.0  --  0.9  PROT 6.5 6.7  --  7.3  ALBUMIN 2.3* 2.3*  --  2.8*    Recent Labs  12/17/14 0524  AMMONIA 23   CBC: CBC Latest Ref Rng 06/12/2015 06/03/2015 04/24/2015  WBC - 4.5 3.5 4.5  Hemoglobin 12.0 - 16.0 g/dL 8.0(A) 8.8(A) 8.7(A)  Hematocrit 36 - 46 % 28(A) 30(A) 30(A)  Platelets 150 - 399 K/L 230 234 238      Lipid Panel:  Recent Labs  12/17/14 0514  HDL 22*   Cardiac Enzymes:  Recent Labs  12/14/14 2126 12/15/14 0239 12/15/14 0814  TROPONINI 0.06* 0.04* 0.06*   CBG:  Recent Labs  04/20/15 1155  GLUCAP 127*     ASSESSMENT/PLAN:  Hypothyoidism - continue Levothyroxine 50 mcg 1 trab PO daily; check tsh  Anemia, acute blood loss - -check hgb 8.0; re-check hgb 9.1, improved  Allergic rhinitis - stable; continue Flonase 50 g/ACT 2 sprays into both nostrils daily and loratadine 10 mg daily  Pruritus - start Atarax 25 mg 1 tab PO BID  PRN  Insomnia - continue melatonin 5 mg daily at bedtime  Chronic systolic heart failure with AICD - no SOB; continue metoprolol 25 mg 1/2 tab = 12.5 mg twice a day, Lasix 80 mg take 2 tabs = 160 mg twice a day; check BMP  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; continue KCl 20 MEQ take 2 tabs = 40 MEQ by mouth twice a day; check BMP  Dementia - stable  Depression - mood is stable; continue Cymbalta 20 mg daily  Constipation - continue MiraLAX 17 g twice a day when necessary and senna S1 tab by mouth daily at bedtime when necessary  Protein calorie malnutrition -   Continue Procel  1 scoop PO  twice a day  Chronic atrial fibrillation - rate controlled; continue metoprolol 25 mg take 1/2 tab = 12.5 mg twice a day and Coumadin 3 mg daily     Goals of care:  Long-term care    [facility], NP [facility] [phone]

## 2015-07-01 LAB — BASIC METABOLIC PANEL
BUN: 27 mg/dL — AB (ref 4–21)
Creatinine: 1.7 mg/dL — AB (ref 0.5–1.1)
GLUCOSE: 82 mg/dL
POTASSIUM: 3.6 mmol/L (ref 3.4–5.3)
SODIUM: 141 mmol/L (ref 137–147)

## 2015-07-01 LAB — TSH: TSH: 1.55 u[IU]/mL (ref 0.41–5.90)

## 2015-07-16 ENCOUNTER — Encounter: Payer: Self-pay | Admitting: Adult Health

## 2015-07-16 ENCOUNTER — Non-Acute Institutional Stay (SKILLED_NURSING_FACILITY): Payer: 59 | Admitting: Adult Health

## 2015-07-16 DIAGNOSIS — B372 Candidiasis of skin and nail: Secondary | ICD-10-CM | POA: Diagnosis not present

## 2015-07-16 NOTE — Progress Notes (Signed)
Patient ID: Margaret [name], female   DOB: 10/19/1940, 75 y.o.   MRN: [id number]    DATE:    07/16/15  MRN:  [id number]  BIRTHDAY: 1940-11-26  Facility:  Nursing Home Location:  [facility]  Nursing Home Room Number: 803-1  LEVEL OF CARE:  SNF [phone])  Contact Information    Name Relation Home Work [place] Daughter [phone]  [phone]   Margaret, [name]) Spouse [phone]  [phone]   Margaret, [name]   [phone]       Chief Complaint  Patient presents with  . Acute Visit    Candida, skin    HISTORY OF PRESENT ILLNESS:  This is a 75 year old female who was noted to have erythematous rashes on bilateral abdominal folds. Skin is moist.  PAST MEDICAL HISTORY:  Past Medical History  Diagnosis Date  . NICM (nonischemic cardiomyopathy) (HCC)     EF 35%; cath 2/12 no CAD  . Systolic CHF, chronic (HCC)   . Severe mitral regurgitation     s/p complex mitral valve repair with cox maze + LAA clipping, removal of RV lead, and implantation of CRT-D in abdomen  . Cardiac arrest - ventricular fibrillation     in 1990s  . Atrial fibrillation (HCC)     amiodarone  . HTN (hypertension)   . HLD (hyperlipidemia)   . Achalasia     s/p dilation  . Recurrent UTI   . Obesity   . Anoxic brain injury (HCC) 1996    s/p cardiac arrest; "in a coma for 16 days"  . Automatic implantable cardioverter-defibrillator in situ   . Hypothyroidism   . TIA (transient ischemic attack)   . Chronic kidney disease (CKD), stage IV (severe) (HCC)     Margaret [name] 10/03/2013  . Rectal bleeding   . Vaginal bleeding   . Physical deconditioning   . Acute blood loss anemia   . Protein calorie malnutrition (HCC)   . Allergic rhinitis   . Overactive bladder   . Lewy body dementia without behavioral disturbance   . Long term (current) use of anticoagulants   . Insomnia   . Major depression, chronic (HCC)   . Constipation   . Diuretic-induced hypokalemia      CURRENT  MEDICATIONS: Reviewed  Patient's Medications  New Prescriptions   No medications on file  Previous Medications   ACETAMINOPHEN (TYLENOL) 500 MG TABLET    Take 500 mg by mouth at bedtime.   DULOXETINE (CYMBALTA) 20 MG CAPSULE    Take 20 mg by mouth daily. For depression   FLUTICASONE (FLONASE) 50 MCG/ACT NASAL SPRAY    Place 2 sprays into both nostrils at bedtime. For allergies   FOLIC ACID (FOLVITE) 1 MG TABLET    Take 1 tablet by mouth daily.   FUROSEMIDE (LASIX) 80 MG TABLET    Take 160 mg by mouth 2 (two) times daily. Take 2 tabs = 160 mg PO BID   HYDROXYZINE (ATARAX/VISTARIL) 25 MG TABLET    Take 25 mg by mouth 2 (two) times daily as needed for itching.   LEVOTHYROXINE (SYNTHROID, LEVOTHROID) 50 MCG TABLET    Take 50 mcg by mouth daily before breakfast.   LORATADINE (CLARITIN) 10 MG TABLET    Take 10 mg by mouth daily.   MELATONIN 5 MG TABS    Take 5 mg by mouth at bedtime.    METOPROLOL TARTRATE (LOPRESSOR) 25 MG TABLET    Take 12.5 mg by mouth 2 (two) times  daily.    MIRABEGRON ER (MYRBETRIQ) 50 MG TB24 TABLET    Take 50 mg by mouth daily.   MULTIPLE VITAMINS-MINERALS (DECUBI-VITE PO)    Take 1 capsule by mouth daily. For wound healing   NYSTATIN CREAM (MYCOSTATIN)    Apply 1 application topically 2 (two) times daily. Apply to bilateral abdominal folds BID x3 weeks   POLYETHYLENE GLYCOL 3350 (MIRALAX PO)    Take 17 g by mouth 2 (two) times daily as needed.   POTASSIUM CHLORIDE SA (K-DUR,KLOR-CON) 20 MEQ TABLET    Take 40 mEq by mouth 2 (two) times daily.   PROTEIN (PROCEL 100 PO)    Take 1 scoop by mouth 2 (two) times daily.    SACCHAROMYCES BOULARDII (FLORASTOR) 250 MG CAPSULE    Take 250 mg by mouth 2 (two) times daily.   SENNA-DOCUSATE (SENOKOT-S) 8.6-50 MG PER TABLET    Take 1 tablet by mouth at bedtime as needed for mild constipation.  Modified Medications   No medications on file  Discontinued Medications   SACCHAROMYCES BOULARDII (FLORASTOR) 250 MG CAPSULE    Take 250 mg by  mouth 2 (two) times daily.   WARFARIN (COUMADIN) 3 MG TABLET    Take 3 mg by mouth daily.     No Known Allergies   REVIEW OF SYSTEMS:  GENERAL: no change in appetite, no fatigue, no weight changes, no fever, chills or weakness EYES: Denies change in vision, dry eyes, eye pain, itching or discharge EARS: Denies change in hearing, ringing in ears, or earache NOSE: Denies nasal congestion or epistaxis MOUTH and THROAT: Denies oral discomfort, gingival pain or bleeding, pain from teeth or hoarseness   RESPIRATORY: no cough, SOB, DOE, wheezing, hemoptysis CARDIAC: no chest pain, or palpitations GI: no abdominal pain, diarrhea, constipation, heart burn, nausea or vomiting GU: Denies dysuria, frequency, hematuria, incontinence, or discharge PSYCHIATRIC: Denies feeling of depression or anxiety. No report of hallucinations, insomnia, paranoia, or agitation   PHYSICAL EXAMINATION  GENERAL APPEARANCE: Well nourished. In no acute distress. Obese SKIN:  Has erythematous rashes on bilateral abdominal folds HEAD: Normal in size and contour. No evidence of trauma EYES: Lids open and close normally. No blepharitis, entropion or ectropion. PERRL. Conjunctivae are clear and sclerae are white. Lenses are without opacity EARS: Pinnae are normal. Patient hears normal voice tunes of the examiner MOUTH and THROAT: Lips are without lesions. Oral mucosa is moist and without lesions. Tongue is normal in shape, size, and color and without lesions NECK: supple, trachea midline, no neck masses, no thyroid tenderness, no thyromegaly LYMPHATICS: no LAN in the neck, no supraclavicular LAN RESPIRATORY: breathing is even & unlabored, BS CTAB CARDIAC: no murmur,no extra heart sounds, 1+ edema on BLE GI: abdomen soft, normal BS, no masses, no tenderness, no hepatomegaly, no splenomegaly EXTREMITIES:  Able to move 4 extremities; generalized weakness of BLE PSYCHIATRIC: Alert and oriented X 3. Affect and behavior are  appropriate  LABS/RADIOLOGY: Labs reviewed: 06/30/15   Wbc 3.7  hgb 9.1  hct 89.5  MCV 30.8  Platelet 259 Basic Metabolic Panel:  Recent Labs  14/38/88 1542 12/17/14 0514 12/18/14 0615  04/16/15 1334 04/17/15 0547 04/18/15 0521  06/03/15 06/05/15 07/01/15  NA  --  139 141  < > 141 143 143  < > 141 141 141  K 3.3* 3.7 3.3*  < > 3.8 3.1* 3.7  < > 3.4 3.9 3.6  CL  --  106 105  --  102 105 106  --   --   --   --  CO2  --  23 27  --   --  28 25  --   --   --   --   GLUCOSE  --  100* 110*  --  101* 93 97  --   --   --   --   BUN  --  20 16  < > 28* 20 15  < > 22* 22* 27*  CREATININE  --  1.71* 1.59*  < > 1.80* 1.60* 1.55*  < > 1.4* 1.5* 1.7*  CALCIUM  --  8.3* 8.5*  --   --  8.6* 9.0  --   --   --   --   MG 2.2 2.1  --   --   --   --  2.3  --   --   --   --   < > = values in this interval not displayed. Liver Function Tests:  Recent Labs  12/17/14 0514 12/18/14 0615 04/02/15 04/17/15 0547  AST 22 22 16  16 17   ALT 16 16 8  8  9*  ALKPHOS 74 71 76  76 63  BILITOT 0.8 1.0  --  0.9  PROT 6.5 6.7  --  7.3  ALBUMIN 2.3* 2.3*  --  2.8*    Recent Labs  12/17/14 0524  AMMONIA 23   CBC: CBC Latest Ref Rng 06/30/2015 06/12/2015 06/03/2015  WBC - 3.7 4.5 3.5  Hemoglobin 12.0 - 16.0 g/dL 9.1(A) 8.0(A) 8.8(A)  Hematocrit 36 - 46 % 31(A) 28(A) 30(A)  Platelets 150 - 399 K/L 259 230 234      Lipid Panel:  Recent Labs  12/17/14 0514  HDL 22*   Cardiac Enzymes:  Recent Labs  12/14/14 2126 12/15/14 0239 12/15/14 0814  TROPONINI 0.06* 0.04* 0.06*   CBG:  Recent Labs  04/20/15 1155  GLUCAP 127*     ASSESSMENT/PLAN:  Candidal skin infection -  apply Nystatin cream 100,000 units/g to bilateral abdominal skin folds BID X 3 weeks.   [facility], NP [employer] [phone]

## 2015-07-17 ENCOUNTER — Encounter: Payer: Self-pay | Admitting: Adult Health

## 2015-07-17 ENCOUNTER — Non-Acute Institutional Stay (SKILLED_NURSING_FACILITY): Payer: 59 | Admitting: Adult Health

## 2015-07-17 DIAGNOSIS — Z7901 Long term (current) use of anticoagulants: Secondary | ICD-10-CM | POA: Diagnosis not present

## 2015-07-17 DIAGNOSIS — I482 Chronic atrial fibrillation, unspecified: Secondary | ICD-10-CM

## 2015-07-17 NOTE — Progress Notes (Signed)
Patient ID: Margaret [name], female   DOB: [birth year], 75 y.o.   MRN: [id number] Subjective:     Indication: atrial fibrillation Bleeding signs/symptoms: None Thromboembolic signs/symptoms: None  Missed Coumadin doses: This week - 4 due to supratherapeutic INR Medication changes: no Dietary changes: no Bacterial/viral infection: no Other concerns: no  The following portions of the patient's history were reviewed and updated as appropriate: allergies, current medications, past family history, past medical history, past social history, past surgical history and problem list.  Review of Systems A comprehensive review of systems was negative.   Objective:    INR Today: 2.5 Current dose: Coumadin 3 mg daily     Assessment:    Therapeutic INR for goal of 2-3   Plan:    1. New dose: decrease Coumadin to 2.5 mg daily   2. Next INR: 07/21/15

## 2015-07-21 ENCOUNTER — Non-Acute Institutional Stay (SKILLED_NURSING_FACILITY): Payer: 59 | Admitting: Adult Health

## 2015-07-21 ENCOUNTER — Encounter: Payer: Self-pay | Admitting: Adult Health

## 2015-07-21 DIAGNOSIS — Z7901 Long term (current) use of anticoagulants: Secondary | ICD-10-CM

## 2015-07-21 DIAGNOSIS — I482 Chronic atrial fibrillation, unspecified: Secondary | ICD-10-CM

## 2015-07-21 NOTE — Progress Notes (Signed)
Patient ID: Margaret [name], female   DOB: 08-May-1940, 75 y.o.   MRN: [id number] Subjective:     Indication: atrial fibrillation Bleeding signs/symptoms: None Thromboembolic signs/symptoms: None  Missed Coumadin doses: This week - 1 Medication changes: no Dietary changes: no Bacterial/viral infection: no Other concerns: no  The following portions of the patient's history were reviewed and updated as appropriate: allergies, current medications, past family history, past medical history, past social history, past surgical history and problem list.  Review of Systems A comprehensive review of systems was negative.   Objective:    INR Today: 1.9 Current dose: Coumadin 2.5 mg daily     Assessment:    Subtherapeutic INR for goal of 2-3   Plan:    1. New dose: increase Coumadin to 3 mg daily   2. Next INR: 07/24/15

## 2015-07-27 ENCOUNTER — Encounter: Payer: Self-pay | Admitting: Adult Health

## 2015-07-27 ENCOUNTER — Non-Acute Institutional Stay (SKILLED_NURSING_FACILITY): Payer: 59 | Admitting: Adult Health

## 2015-07-27 DIAGNOSIS — L03115 Cellulitis of right lower limb: Secondary | ICD-10-CM | POA: Diagnosis not present

## 2015-07-27 NOTE — Progress Notes (Signed)
Patient ID: Margaret [name], female   DOB: 08/26/1940, 75 y.o.   MRN: [id number]    DATE:    07/27/15  MRN:  [id number]  BIRTHDAY: 04/22/1941  Facility:  Nursing Home Location:  [facility]  Nursing Home Room Number: 803-1  LEVEL OF CARE:  SNF [phone])  Contact Information    Name Relation Home Work [place] Daughter [phone]  [phone]   Katelynne, Revak) Spouse [phone]  [phone]   Leronda, Lewers   [phone]       Chief Complaint  Patient presents with  . Acute Visit    Right foot cellulitis    HISTORY OF PRESENT ILLNESS:  This is a 75 year old female who was noted to have erythematous right foot. Noted to have dry small scabs as well. Skin is warm to touch and dry.  PAST MEDICAL HISTORY:  Past Medical History  Diagnosis Date  . NICM (nonischemic cardiomyopathy) (HCC)     EF 35%; cath 2/12 no CAD  . Systolic CHF, chronic (HCC)   . Severe mitral regurgitation     s/p complex mitral valve repair with cox maze + LAA clipping, removal of RV lead, and implantation of CRT-D in abdomen  . Cardiac arrest - ventricular fibrillation     in 1990s  . Atrial fibrillation (HCC)     amiodarone  . HTN (hypertension)   . HLD (hyperlipidemia)   . Achalasia     s/p dilation  . Recurrent UTI   . Obesity   . Anoxic brain injury (HCC) 1996    s/p cardiac arrest; "in a coma for 16 days"  . Automatic implantable cardioverter-defibrillator in situ   . Hypothyroidism   . TIA (transient ischemic attack)   . Chronic kidney disease (CKD), stage IV (severe) (HCC)     [name] 10/03/2013  . Rectal bleeding   . Vaginal bleeding   . Physical deconditioning   . Acute blood loss anemia   . Protein calorie malnutrition (HCC)   . Allergic rhinitis   . Overactive bladder   . Lewy body dementia without behavioral disturbance   . Long term (current) use of anticoagulants   . Insomnia   . Major depression, chronic (HCC)   . Constipation   .  Diuretic-induced hypokalemia   . Cellulitis of right foot      CURRENT MEDICATIONS: Reviewed   Patient's Medications  New Prescriptions   No medications on file  Previous Medications   ACETAMINOPHEN (TYLENOL) 500 MG TABLET    Take 500 mg by mouth at bedtime.       DULOXETINE (CYMBALTA) 20 MG CAPSULE    Take 20 mg by mouth daily. For depression   FLUTICASONE (FLONASE) 50 MCG/ACT NASAL SPRAY    Place 2 sprays into both nostrils at bedtime. For allergies   FOLIC ACID (FOLVITE) 1 MG TABLET    Take 1 tablet by mouth daily.   FUROSEMIDE (LASIX) 80 MG TABLET    Take 160 mg by mouth 2 (two) times daily. Take 2 tabs = 160 mg PO BID   HYDROXYZINE (ATARAX/VISTARIL) 25 MG TABLET    Take 25 mg by mouth 2 (two) times daily as needed for itching.   LEVOTHYROXINE (SYNTHROID, LEVOTHROID) 50 MCG TABLET    Take 50 mcg by mouth daily before breakfast.   LORATADINE (CLARITIN) 10 MG TABLET    Take 10 mg by mouth daily.   MELATONIN 5 MG TABS    Take 5 mg by mouth  at bedtime.    METOPROLOL TARTRATE (LOPRESSOR) 25 MG TABLET    Take 12.5 mg by mouth 2 (two) times daily.    MIRABEGRON ER (MYRBETRIQ) 50 MG TB24 TABLET    Take 50 mg by mouth daily.   MULTIPLE VITAMINS-MINERALS (DECUBI-VITE PO)    Take 1 capsule by mouth daily. For wound healing   POLYETHYLENE GLYCOL 3350 (MIRALAX PO)    Take 17 g by mouth 2 (two) times daily as needed.   POTASSIUM CHLORIDE SA (K-DUR,KLOR-CON) 20 MEQ TABLET    Take 40 mEq by mouth 2 (two) times daily.   PROTEIN (PROCEL 100 PO)    Take 1 scoop by mouth 2 (two) times daily.    SACCHAROMYCES BOULARDII (FLORASTOR) 250 MG CAPSULE    Take 250 mg by mouth 2 (two) times daily.   SENNA-DOCUSATE (SENOKOT-S) 8.6-50 MG PER TABLET    Take 1 tablet by mouth at bedtime as needed for mild constipation.   WARFARIN (COUMADIN) 3 MG TABLET    Take 3 mg by mouth daily.  Modified Medications   No medications on file     MUPIROCIN OINTMENT (BACTROBAN) 2 %    Place 1 application into the nose daily.  Apply to right foot daily x10 days for cellulitis.        No Known Allergies   REVIEW OF SYSTEMS:  GENERAL: no change in appetite, no fatigue, no weight changes, no fever, chills or weakness EYES: Denies change in vision, dry eyes, eye pain, itching or discharge EARS: Denies change in hearing, ringing in ears, or earache NOSE: Denies nasal congestion or epistaxis MOUTH and THROAT: Denies oral discomfort, gingival pain or bleeding, pain from teeth or hoarseness   RESPIRATORY: no cough, SOB, DOE, wheezing, hemoptysis CARDIAC: no chest pain, or palpitations GI: no abdominal pain, diarrhea, constipation, heart burn, nausea or vomiting GU: Denies dysuria, frequency, hematuria, incontinence, or discharge PSYCHIATRIC: Denies feeling of depression or anxiety. No report of hallucinations, insomnia, paranoia, or agitation   PHYSICAL EXAMINATION  GENERAL APPEARANCE: Well nourished. In no acute distress. Obese SKIN:  Erythematous right foot with scabs HEAD: Normal in size and contour. No evidence of trauma EYES: Lids open and close normally. No blepharitis, entropion or ectropion. PERRL. Conjunctivae are clear and sclerae are white. Lenses are without opacity EARS: Pinnae are normal. Patient hears normal voice tunes of the examiner MOUTH and THROAT: Lips are without lesions. Oral mucosa is moist and without lesions. Tongue is normal in shape, size, and color and without lesions NECK: supple, trachea midline, no neck masses, no thyroid tenderness, no thyromegaly LYMPHATICS: no LAN in the neck, no supraclavicular LAN RESPIRATORY: breathing is even & unlabored, BS CTAB CARDIAC: no murmur,no extra heart sounds, 1+ edema on BLE GI: abdomen soft, normal BS, no masses, no tenderness, no hepatomegaly, no splenomegaly EXTREMITIES:  Able to move 4 extremities; generalized weakness of BLE PSYCHIATRIC: Alert and oriented X 3. Affect and behavior are appropriate  LABS/RADIOLOGY: Labs reviewed: Basic  Metabolic Panel:  Recent Labs  16/10/96 1542 12/17/14 0514 12/18/14 0615  04/16/15 1334 04/17/15 0547 04/18/15 0521  06/03/15 06/05/15 07/01/15  NA  --  139 141  < > 141 143 143  < > 141 141 141  K 3.3* 3.7 3.3*  < > 3.8 3.1* 3.7  < > 3.4 3.9 3.6  CL  --  106 105  --  102 105 106  --   --   --   --   CO2  --  23 27  --   --  28 25  --   --   --   --   GLUCOSE  --  100* 110*  --  101* 93 97  --   --   --   --   BUN  --  20 16  < > 28* 20 15  < > 22* 22* 27*  CREATININE  --  1.71* 1.59*  < > 1.80* 1.60* 1.55*  < > 1.4* 1.5* 1.7*  CALCIUM  --  8.3* 8.5*  --   --  8.6* 9.0  --   --   --   --   MG 2.2 2.1  --   --   --   --  2.3  --   --   --   --   < > = values in this interval not displayed. Liver Function Tests:  Recent Labs  12/17/14 0514 12/18/14 0615 04/02/15 04/17/15 0547  AST 22 22 16  16 17   ALT 16 16 8  8  9*  ALKPHOS 74 71 76  76 63  BILITOT 0.8 1.0  --  0.9  PROT 6.5 6.7  --  7.3  ALBUMIN 2.3* 2.3*  --  2.8*    Recent Labs  12/17/14 0524  AMMONIA 23   CBC: CBC Latest Ref Rng 06/30/2015 06/12/2015 06/03/2015  WBC - 3.7 4.5 3.5  Hemoglobin 12.0 - 16.0 g/dL 9.1(A) 8.0(A) 8.8(A)  Hematocrit 36 - 46 % 31(A) 28(A) 30(A)  Platelets 150 - 399 K/L 259 230 234      Lipid Panel:  Recent Labs  12/17/14 0514  HDL 22*   Cardiac Enzymes:  Recent Labs  12/14/14 2126 12/15/14 0239 12/15/14 0814  TROPONINI 0.06* 0.04* 0.06*   CBG:  Recent Labs  04/20/15 1155  GLUCAP 127*     ASSESSMENT/PLAN:  Right foot Cellulitis - Apply Bactroban ointment to right foot daily X 10 days   [facility], NP [employer] [phone]

## 2015-07-31 ENCOUNTER — Encounter: Payer: Self-pay | Admitting: Adult Health

## 2015-07-31 NOTE — Progress Notes (Signed)
This encounter was created in error - please disregard.

## 2015-08-04 ENCOUNTER — Encounter: Payer: Self-pay | Admitting: Adult Health

## 2015-08-04 ENCOUNTER — Non-Acute Institutional Stay (SKILLED_NURSING_FACILITY): Payer: 59 | Admitting: Adult Health

## 2015-08-04 DIAGNOSIS — L03119 Cellulitis of unspecified part of limb: Secondary | ICD-10-CM | POA: Diagnosis not present

## 2015-08-04 NOTE — Progress Notes (Signed)
Patient ID: Margaret [name], female   DOB: 1941-03-24, 75 y.o.   MRN: [id number]    DATE:    08/04/15  MRN:  [id number]  BIRTHDAY: [birth year]  Facility:  Nursing Home Location:  [facility]  Nursing Home Room Number: 803-1  LEVEL OF CARE:  SNF [phone])  Contact Information    Name Relation Home Work Margaret [name] [phone]  [phone]   Margaret, [name]) Spouse [phone]  [phone]   Margaret, [name]   [phone]       Chief Complaint  Patient presents with  . Acute Visit    Cellulitis bilateral lower extremities    HISTORY OF PRESENT ILLNESS:  This is a 75 year old female who was recently started on Bactroban for right foot cellulitis. Noted that both lower legs are now erythematous and left foot has abrasions. Patient verbalized putting bandaide on leg wounds. She has allergy to latex and bandaide has latex. Bilateral foot extending to the legs are erythematous and has BLE edema 1+. No fever nor pus have been noted.   PAST MEDICAL HISTORY:  Past Medical History  Diagnosis Date  . NICM (nonischemic cardiomyopathy) (HCC)     EF 35%; cath 2/12 no CAD  . Systolic CHF, chronic (HCC)   . Severe mitral regurgitation     s/p complex mitral valve repair with cox maze + LAA clipping, removal of RV lead, and implantation of CRT-D in abdomen  . Cardiac arrest - ventricular fibrillation     in 1990s  . Atrial fibrillation (HCC)     amiodarone  . HTN (hypertension)   . HLD (hyperlipidemia)   . Achalasia     s/p dilation  . Recurrent UTI   . Obesity   . Anoxic brain injury (HCC) 1996    s/p cardiac arrest; "in a coma for 16 days"  . Automatic implantable cardioverter-defibrillator in situ   . Hypothyroidism   . TIA (transient ischemic attack)   . Chronic kidney disease (CKD), stage IV (severe) (HCC)     Margaret [name] 10/03/2013  . Rectal bleeding   . Vaginal bleeding   . Physical deconditioning   . Acute blood loss anemia   . Protein  calorie malnutrition (HCC)   . Allergic rhinitis   . Overactive bladder   . Lewy body dementia without behavioral disturbance   . Long term (current) use of anticoagulants   . Insomnia   . Major depression, chronic (HCC)   . Constipation   . Diuretic-induced hypokalemia   . Cellulitis of right foot      CURRENT MEDICATIONS: Reviewed     Medication List       This list is accurate as of: 08/04/15  8:52 PM.  Always use your most recent med list.               acetaminophen 500 MG tablet  Commonly known as:  TYLENOL  Take 500 mg by mouth at bedtime.     DECUBI-VITE PO  Take 1 capsule by mouth daily. For wound healing     doxycycline 100 MG tablet  Commonly known as:  VIBRA-TABS  Take 100 mg by mouth 2 (two) times daily. Take for 14 days     DULoxetine 20 MG capsule  Commonly known as:  CYMBALTA  Take 20 mg by mouth daily. For depression     fluticasone 50 MCG/ACT nasal spray  Commonly known as:  FLONASE  Place 2 sprays into both nostrils at bedtime. For  allergies     folic acid 1 MG tablet  Commonly known as:  FOLVITE  Take 1 tablet by mouth daily.     furosemide 80 MG tablet  Commonly known as:  LASIX  Take 160 mg by mouth 2 (two) times daily. Take 2 tabs = 160 mg PO BID     hydrOXYzine 25 MG tablet  Commonly known as:  ATARAX/VISTARIL  Take 25 mg by mouth 2 (two) times daily as needed for itching.     levothyroxine 50 MCG tablet  Commonly known as:  SYNTHROID, LEVOTHROID  Take 50 mcg by mouth daily before breakfast.     loratadine 10 MG tablet  Commonly known as:  CLARITIN  Take 10 mg by mouth daily.     Melatonin 5 MG Tabs  Take 5 mg by mouth at bedtime.     metoprolol tartrate 25 MG tablet  Commonly known as:  LOPRESSOR  Take 12.5 mg by mouth 2 (two) times daily.     MIRALAX PO  Take 17 g by mouth 2 (two) times daily as needed.     MYRBETRIQ 50 MG Tb24 tablet  Generic drug:  mirabegron ER  Take 50 mg by mouth daily.     potassium chloride  SA 20 MEQ tablet  Commonly known as:  K-DUR,KLOR-CON  Take 40 mEq by mouth 2 (two) times daily.     PROCEL 100 PO  Take 1 scoop by mouth 2 (two) times daily.     saccharomyces boulardii 250 MG capsule  Commonly known as:  FLORASTOR  Take 250 mg by mouth 2 (two) times daily.     senna-docusate 8.6-50 MG tablet  Commonly known as:  Senokot-S  Take 1 tablet by mouth at bedtime as needed for mild constipation.     warfarin 3 MG tablet  Commonly known as:  COUMADIN  Take 3 mg by mouth daily.          No Known Allergies   REVIEW OF SYSTEMS:  GENERAL: no change in appetite, no fatigue, no weight changes, no fever, chills or weakness EYES: Denies change in vision, dry eyes, eye pain, itching or discharge EARS: Denies change in hearing, ringing in ears, or earache NOSE: Denies nasal congestion or epistaxis MOUTH and THROAT: Denies oral discomfort, gingival pain or bleeding, pain from teeth or hoarseness   RESPIRATORY: no cough, SOB, DOE, wheezing, hemoptysis CARDIAC: no chest pain, or palpitations GI: no abdominal pain, diarrhea, constipation, heart burn, nausea or vomiting GU: Denies dysuria, frequency, hematuria, incontinence, or discharge PSYCHIATRIC: Denies feeling of depression or anxiety. No report of hallucinations, insomnia, paranoia, or agitation   PHYSICAL EXAMINATION  GENERAL APPEARANCE: Well nourished. In no acute distress. Obese SKIN:  Erythematous BLE with abrasions HEAD: Normal in size and contour. No evidence of trauma EYES: Lids open and close normally. No blepharitis, entropion or ectropion. PERRL. Conjunctivae are clear and sclerae are white. Lenses are without opacity EARS: Pinnae are normal. Patient hears normal voice tunes of the examiner MOUTH and THROAT: Lips are without lesions. Oral mucosa is moist and without lesions. Tongue is normal in shape, size, and color and without lesions NECK: supple, trachea midline, no neck masses, no thyroid tenderness, no  thyromegaly LYMPHATICS: no LAN in the neck, no supraclavicular LAN RESPIRATORY: breathing is even & unlabored, BS CTAB CARDIAC: no murmur,no extra heart sounds, 1+ edema on BLE GI: abdomen soft, normal BS, no masses, no tenderness, no hepatomegaly, no splenomegaly EXTREMITIES:  Able to move 4  extremities; generalized weakness of BLE PSYCHIATRIC: Alert and oriented X 3. Affect and behavior are appropriate  LABS/RADIOLOGY: Labs reviewed: Basic Metabolic Panel:  Recent Labs  86/57/84 1542 12/17/14 0514 12/18/14 0615  04/16/15 1334 04/17/15 0547 04/18/15 0521  06/03/15 06/05/15 07/01/15  NA  --  139 141  < > 141 143 143  < > 141 141 141  K 3.3* 3.7 3.3*  < > 3.8 3.1* 3.7  < > 3.4 3.9 3.6  CL  --  106 105  --  102 105 106  --   --   --   --   CO2  --  23 27  --   --  28 25  --   --   --   --   GLUCOSE  --  100* 110*  --  101* 93 97  --   --   --   --   BUN  --  20 16  < > 28* 20 15  < > 22* 22* 27*  CREATININE  --  1.71* 1.59*  < > 1.80* 1.60* 1.55*  < > 1.4* 1.5* 1.7*  CALCIUM  --  8.3* 8.5*  --   --  8.6* 9.0  --   --   --   --   MG 2.2 2.1  --   --   --   --  2.3  --   --   --   --   < > = values in this interval not displayed. Liver Function Tests:  Recent Labs  12/17/14 0514 12/18/14 0615 04/02/15 04/17/15 0547  AST ALT 9*  ALKPHOS 74 71 76  76 63  BILITOT 0.8 1.0  --  0.9  PROT 6.5 6.7  --  7.3  ALBUMIN 2.3* 2.3*  --  2.8*    Recent Labs  12/17/14 0524  AMMONIA 23   CBC: CBC Latest Ref Rng 06/30/2015 06/12/2015 06/03/2015  WBC - 3.7 4.5 3.5  Hemoglobin 12.0 - 16.0 g/dL 9.1(A) 8.0(A) 8.8(A)  Hematocrit 36 - 46 % 31(A) 28(A) 30(A)  Platelets 150 - 399 K/L 259 230 234      Lipid Panel:  Recent Labs  12/17/14 0514  HDL 22*   Cardiac Enzymes:  Recent Labs  12/14/14 2126 12/15/14 0239 12/15/14 0814  TROPONINI 0.06* 0.04* 0.06*   CBG:  Recent Labs  04/20/15 1155  GLUCAP 127*     ASSESSMENT/PLAN:  BLE  Cellulitis - discontinue Bactroban; educated patient not to use bandaide which has latex; start Doxycycline 100 mg 1 PO BID X 14 days.   [facility], NP [employer] [phone]

## 2015-08-18 ENCOUNTER — Non-Acute Institutional Stay (SKILLED_NURSING_FACILITY): Payer: 59 | Admitting: Adult Health

## 2015-08-18 ENCOUNTER — Encounter: Payer: Self-pay | Admitting: Adult Health

## 2015-08-18 DIAGNOSIS — I482 Chronic atrial fibrillation, unspecified: Secondary | ICD-10-CM

## 2015-08-18 DIAGNOSIS — N183 Chronic kidney disease, stage 3 unspecified: Secondary | ICD-10-CM

## 2015-08-18 DIAGNOSIS — L299 Pruritus, unspecified: Secondary | ICD-10-CM

## 2015-08-18 DIAGNOSIS — F329 Major depressive disorder, single episode, unspecified: Secondary | ICD-10-CM | POA: Diagnosis not present

## 2015-08-18 DIAGNOSIS — J309 Allergic rhinitis, unspecified: Secondary | ICD-10-CM

## 2015-08-18 DIAGNOSIS — E876 Hypokalemia: Secondary | ICD-10-CM

## 2015-08-18 DIAGNOSIS — I5022 Chronic systolic (congestive) heart failure: Secondary | ICD-10-CM

## 2015-08-18 DIAGNOSIS — Z7901 Long term (current) use of anticoagulants: Secondary | ICD-10-CM | POA: Diagnosis not present

## 2015-08-18 DIAGNOSIS — G4700 Insomnia, unspecified: Secondary | ICD-10-CM

## 2015-08-18 DIAGNOSIS — E46 Unspecified protein-calorie malnutrition: Secondary | ICD-10-CM | POA: Diagnosis not present

## 2015-08-18 DIAGNOSIS — G3183 Dementia with Lewy bodies: Secondary | ICD-10-CM

## 2015-08-18 DIAGNOSIS — K5900 Constipation, unspecified: Secondary | ICD-10-CM | POA: Diagnosis not present

## 2015-08-18 DIAGNOSIS — E039 Hypothyroidism, unspecified: Secondary | ICD-10-CM | POA: Diagnosis not present

## 2015-08-18 DIAGNOSIS — D62 Acute posthemorrhagic anemia: Secondary | ICD-10-CM | POA: Diagnosis not present

## 2015-08-18 DIAGNOSIS — F0280 Dementia in other diseases classified elsewhere without behavioral disturbance: Secondary | ICD-10-CM

## 2015-08-18 NOTE — Progress Notes (Signed)
Patient ID: Margaret [name], female   DOB: Jun 30, 1940, 75 y.o.   MRN: [id number]    DATE:    08/18/15  MRN:  [id number]  BIRTHDAY: Jul 18, 1940  Facility:  Nursing Home Location:  [facility]  Nursing Home Room Number: 803-1  LEVEL OF CARE:  SNF [phone])  Contact Information    Name Relation Home Work [place] Daughter [phone]  [phone]   Jaraya, [place]) Spouse [phone]  [phone]   Nyashia, Hirschy   [phone]       Chief Complaint  Patient presents with  . Medical Management of Chronic Issues    HISTORY OF PRESENT ILLNESS:  This is a 75 year old female who is being seen for a routine visit. She is a long-term resident at [facility]. Latest INR is 3.2, supratherapeutic. No bleeding noted. She has just completed antibiotic, Doxycycline, for BLE cellulitis. Bilateral legs has unna boot and noted edema has subsided. Mood is stable. Patient verbalized that her husband is in the hospital is seriously sick. Heart rate is rate-controlled.  PAST MEDICAL HISTORY:  Past Medical History  Diagnosis Date  . NICM (nonischemic cardiomyopathy) (HCC)     EF 35%; cath 2/12 no CAD  . Systolic CHF, chronic (HCC)   . Severe mitral regurgitation     s/p complex mitral valve repair with cox maze + LAA clipping, removal of RV lead, and implantation of CRT-D in abdomen  . Cardiac arrest - ventricular fibrillation     in 1990s  . Atrial fibrillation (HCC)     amiodarone  . HTN (hypertension)   . HLD (hyperlipidemia)   . Achalasia     s/p dilation  . Recurrent UTI   . Obesity   . Anoxic brain injury (HCC) 1996    s/p cardiac arrest; "in a coma for 16 days"  . Automatic implantable cardioverter-defibrillator in situ   . Hypothyroidism   . TIA (transient ischemic attack)   . Chronic kidney disease (CKD), stage IV (severe) (HCC)     [name] 10/03/2013  . Rectal bleeding   . Vaginal bleeding   . Physical deconditioning   . Acute blood loss anemia    . Protein calorie malnutrition (HCC)   . Allergic rhinitis   . Overactive bladder   . Lewy body dementia without behavioral disturbance   . Long term (current) use of anticoagulants   . Insomnia   . Major depression, chronic (HCC)   . Constipation   . Diuretic-induced hypokalemia   . Cellulitis of right foot      CURRENT MEDICATIONS: Reviewed  Patient's Medications  New Prescriptions   No medications on file  Previous Medications   ACETAMINOPHEN (TYLENOL) 500 MG TABLET    Take 500 mg by mouth at bedtime.   DULOXETINE (CYMBALTA) 20 MG CAPSULE    Take 20 mg by mouth daily. For depression   FLUTICASONE (FLONASE) 50 MCG/ACT NASAL SPRAY    Place 2 sprays into both nostrils at bedtime. For allergies   FOLIC ACID (FOLVITE) 1 MG TABLET    Take 1 tablet by mouth daily.   FUROSEMIDE (LASIX) 80 MG TABLET    Take 160 mg by mouth 2 (two) times daily. Take 2 tabs = 160 mg PO BID   HYDROXYZINE (ATARAX/VISTARIL) 25 MG TABLET    Take 25 mg by mouth 2 (two) times daily as needed for itching.   LEVOTHYROXINE (SYNTHROID, LEVOTHROID) 50 MCG TABLET    Take 50 mcg by mouth daily before  breakfast.   LORATADINE (CLARITIN) 10 MG TABLET    Take 10 mg by mouth daily.   MELATONIN 5 MG TABS    Take 5 mg by mouth at bedtime.    METOPROLOL TARTRATE (LOPRESSOR) 25 MG TABLET    Take 12.5 mg by mouth 2 (two) times daily.    MIRABEGRON ER (MYRBETRIQ) 50 MG TB24 TABLET    Take 50 mg by mouth daily.   MULTIPLE VITAMINS-MINERALS (DECUBI-VITE PO)    Take 1 capsule by mouth daily. For wound healing   POLYETHYLENE GLYCOL 3350 (MIRALAX PO)    Take 17 g by mouth 2 (two) times daily as needed.   POTASSIUM CHLORIDE SA (K-DUR,KLOR-CON) 20 MEQ TABLET    Take 40 mEq by mouth 2 (two) times daily.   PROTEIN (PROCEL 100 PO)    Take 1 scoop by mouth 2 (two) times daily.    SACCHAROMYCES BOULARDII (FLORASTOR) 250 MG CAPSULE    Take 250 mg by mouth 2 (two) times daily.   SENNA-DOCUSATE (SENOKOT-S) 8.6-50 MG PER TABLET    Take 1 tablet  by mouth at bedtime as needed for mild constipation.   WARFARIN (COUMADIN) 3 MG TABLET    Take 3 mg by mouth daily.  Modified Medications   No medications on file  Discontinued Medications   DOXYCYCLINE (VIBRA-TABS) 100 MG TABLET    Take 100 mg by mouth 2 (two) times daily. Take for 14 days     No Known Allergies   REVIEW OF SYSTEMS:  GENERAL: no change in appetite, no fatigue, no weight changes, no fever, chills or weakness EYES: Denies change in vision, dry eyes, eye pain, itching or discharge EARS: Denies change in hearing, ringing in ears, or earache NOSE: Denies nasal congestion or epistaxis MOUTH and THROAT: Denies oral discomfort, gingival pain or bleeding, pain from teeth or hoarseness   RESPIRATORY: no cough, SOB, DOE, wheezing, hemoptysis CARDIAC: no chest pain, or palpitations GI: no abdominal pain, diarrhea, constipation, heart burn, nausea or vomiting GU: Denies dysuria, frequency, hematuria, incontinence, or discharge PSYCHIATRIC: Denies feeling of depression or anxiety. No report of hallucinations, insomnia, paranoia, or agitation   PHYSICAL EXAMINATION  GENERAL APPEARANCE: Well nourished. In no acute distress. Obese SKIN:  Skin is warm and dry. HEAD: Normal in size and contour. No evidence of trauma EYES: Lids open and close normally. No blepharitis, entropion or ectropion. PERRL. Conjunctivae are clear and sclerae are white. Lenses are without opacity EARS: Pinnae are normal. Patient hears normal voice tunes of the examiner MOUTH and THROAT: Lips are without lesions. Oral mucosa is moist and without lesions. Tongue is normal in shape, size, and color and without lesions NECK: supple, trachea midline, no neck masses, no thyroid tenderness, no thyromegaly LYMPHATICS: no LAN in the neck, no supraclavicular LAN RESPIRATORY: breathing is even & unlabored, BS CTAB CARDIAC: no murmur,no extra heart sounds, Irregularly irregular heart rate GI: abdomen soft, normal BS,  no masses, no tenderness, no hepatomegaly, no splenomegaly EXTREMITIES:  Able to move 4 extremities; generalized weakness of BLE; BLE has unna boots PSYCHIATRIC: Alert and oriented X 3. Affect and behavior are appropriate  LABS/RADIOLOGY: Labs reviewed: Basic Metabolic Panel:  Recent Labs  16/10/96 1542 12/17/14 0514 12/18/14 0615  04/16/15 1334 04/17/15 0547 04/18/15 0521  06/03/15 06/05/15 07/01/15  NA  --  139 141  < > 141 143 143  < > 141 141 141  K 3.3* 3.7 3.3*  < > 3.8 3.1* 3.7  < > 3.4 3.9  3.6  CL  --  106 105  --  102 105 106  --   --   --   --   CO2  --  23 27  --   --  28 25  --   --   --   --   GLUCOSE  --  100* 110*  --  101* 93 97  --   --   --   --   BUN  --  20 16  < > 28* 20 15  < > 22* 22* 27*  CREATININE  --  1.71* 1.59*  < > 1.80* 1.60* 1.55*  < > 1.4* 1.5* 1.7*  CALCIUM  --  8.3* 8.5*  --   --  8.6* 9.0  --   --   --   --   MG 2.2 2.1  --   --   --   --  2.3  --   --   --   --   < > = values in this interval not displayed. Liver Function Tests:  Recent Labs  12/17/14 0514 12/18/14 0615 04/02/15 04/17/15 0547  AST 22 22 16  16 17   ALT 16 16 8  8  9*  ALKPHOS 74 71 76  76 63  BILITOT 0.8 1.0  --  0.9  PROT 6.5 6.7  --  7.3  ALBUMIN 2.3* 2.3*  --  2.8*    Recent Labs  12/17/14 0524  AMMONIA 23   CBC: CBC Latest Ref Rng 06/30/2015 06/12/2015 06/03/2015  WBC - 3.7 4.5 3.5  Hemoglobin 12.0 - 16.0 g/dL 9.1(A) 8.0(A) 8.8(A)  Hematocrit 36 - 46 % 31(A) 28(A) 30(A)  Platelets 150 - 399 K/L 259 230 234      Lipid Panel:  Recent Labs  12/17/14 0514  HDL 22*   Cardiac Enzymes:  Recent Labs  12/14/14 2126 12/15/14 0239 12/15/14 0814  TROPONINI 0.06* 0.04* 0.06*   CBG:  Recent Labs  04/20/15 1155  GLUCAP 127*     ASSESSMENT/PLAN:  Long-term use of anticoagulant - INR 3.2; supratherapeutic; hold Coumadin and re-check INR on 08/19/15  Hypothyoidism - continue Levothyroxine 50 mcg 1 trab PO daily;  tsh 1.549  Protein calorie  malnutrition -   Continue Procel  1 scoop PO  twice a day  Anemia, acute blood loss -   hgb 9.1, check CBC  Allergic rhinitis - stable; continue Flonase 50 g/ACT 2 sprays into both nostrils daily and loratadine 10 mg daily  Pruritus - start Atarax 25 mg 1 tab PO BID PRN  Insomnia - continue melatonin 5 mg daily at bedtime  Chronic systolic heart failure with AICD - no SOB; continue metoprolol 25 mg 1/2 tab = 12.5 mg twice a day, Lasix 80 mg take 2 tabs = 160 mg twice a day  Hypokalemia - K3.6 ; continue KCl 20 MEQ take 2 tabs = 40 MEQ by mouth twice a day; check CMP  Dementia - stable  Depression - mood is stable; continue Cymbalta 20 mg daily  Constipation - continue MiraLAX 17 g twice a day when necessary and senna S1 tab by mouth daily at bedtime when necessary  Chronic atrial fibrillation - rate controlled; continue metoprolol 25 mg take 1/2 tab = 12.5 mg twice a day and Coumadin   CKD stage 3 - creatinine 1.72; will monitor     Goals of care:  Long-term care    [facility], NP [facility] [phone]

## 2015-08-19 LAB — HEPATIC FUNCTION PANEL
ALK PHOS: 103 U/L (ref 25–125)
ALT: 9 U/L (ref 7–35)
AST: 16 U/L (ref 13–35)
Bilirubin, Total: 1.1 mg/dL

## 2015-08-19 LAB — BASIC METABOLIC PANEL
BUN: 50 mg/dL — AB (ref 4–21)
CREATININE: 1.8 mg/dL — AB (ref 0.5–1.1)
Glucose: 86 mg/dL
POTASSIUM: 3.9 mmol/L (ref 3.4–5.3)
Sodium: 143 mmol/L (ref 137–147)

## 2015-08-20 ENCOUNTER — Encounter: Payer: Self-pay | Admitting: Adult Health

## 2015-08-20 ENCOUNTER — Non-Acute Institutional Stay (SKILLED_NURSING_FACILITY): Payer: 59 | Admitting: Adult Health

## 2015-08-20 DIAGNOSIS — I482 Chronic atrial fibrillation, unspecified: Secondary | ICD-10-CM

## 2015-08-20 DIAGNOSIS — Z7901 Long term (current) use of anticoagulants: Secondary | ICD-10-CM | POA: Diagnosis not present

## 2015-08-20 NOTE — Progress Notes (Signed)
Patient ID: Margaret [name], female   DOB: 1941/02/11, 75 y.o.   MRN: [id number] Subjective:     Indication: atrial fibrillation Bleeding signs/symptoms: None Thromboembolic signs/symptoms: None  Missed Coumadin doses: This week - 2 due to supratherapeutic INR Medication changes: no Dietary changes: no Bacterial/viral infection: no Other concerns: no  The following portions of the patient's history were reviewed and updated as appropriate: allergies, current medications, past family history, past medical history, past social history, past surgical history and problem list.  Review of Systems A comprehensive review of systems was negative.   Objective:    INR Today: 2.8 Current dose: held X 2 days and was on 3 mg of Coumadin  Assessment:    Therapeutic INR for goal of 2-3   Plan:    1. New dose: Decrease Coumadin to 2.5 mg daily   2. Next INR: 08/24/15

## 2015-09-02 LAB — HEMOGLOBIN A1C: Hemoglobin A1C: 5.5

## 2015-09-11 ENCOUNTER — Encounter: Payer: Self-pay | Admitting: Adult Health

## 2015-09-11 ENCOUNTER — Non-Acute Institutional Stay (SKILLED_NURSING_FACILITY): Payer: 59 | Admitting: Adult Health

## 2015-09-11 DIAGNOSIS — D62 Acute posthemorrhagic anemia: Secondary | ICD-10-CM | POA: Diagnosis not present

## 2015-09-11 DIAGNOSIS — I5022 Chronic systolic (congestive) heart failure: Secondary | ICD-10-CM | POA: Diagnosis not present

## 2015-09-11 DIAGNOSIS — L299 Pruritus, unspecified: Secondary | ICD-10-CM

## 2015-09-11 DIAGNOSIS — E46 Unspecified protein-calorie malnutrition: Secondary | ICD-10-CM

## 2015-09-11 DIAGNOSIS — G4700 Insomnia, unspecified: Secondary | ICD-10-CM

## 2015-09-11 DIAGNOSIS — N183 Chronic kidney disease, stage 3 unspecified: Secondary | ICD-10-CM

## 2015-09-11 DIAGNOSIS — F0390 Unspecified dementia without behavioral disturbance: Secondary | ICD-10-CM

## 2015-09-11 DIAGNOSIS — E039 Hypothyroidism, unspecified: Secondary | ICD-10-CM | POA: Diagnosis not present

## 2015-09-11 DIAGNOSIS — K5900 Constipation, unspecified: Secondary | ICD-10-CM

## 2015-09-11 DIAGNOSIS — F0393 Unspecified dementia, unspecified severity, with mood disturbance: Secondary | ICD-10-CM

## 2015-09-11 DIAGNOSIS — I482 Chronic atrial fibrillation, unspecified: Secondary | ICD-10-CM

## 2015-09-11 DIAGNOSIS — F329 Major depressive disorder, single episode, unspecified: Secondary | ICD-10-CM | POA: Diagnosis not present

## 2015-09-11 DIAGNOSIS — E876 Hypokalemia: Secondary | ICD-10-CM

## 2015-09-11 DIAGNOSIS — J309 Allergic rhinitis, unspecified: Secondary | ICD-10-CM

## 2015-09-11 NOTE — Progress Notes (Signed)
Patient ID: Margaret [name], female   DOB: 25-Mar-1941, 75 y.o.   MRN: [id number]    DATE:    09/11/15  MRN:  [id number]  BIRTHDAY: [birth year]  Facility:  Nursing Home Location:  [facility]  Nursing Home Room Number: 803-1  LEVEL OF CARE:  SNF [phone])  Contact Information    Name Relation Home Work [name] Daughter [phone]  [phone]   [name], [name]) Spouse [phone]  [phone]   [name], Carducci   [phone]       Chief Complaint  Patient presents with  . Medical Management of Chronic Issues    HISTORY OF PRESENT ILLNESS:  This is a 75 year old female who is being seen for a routine visit. She is a long-term resident at [facility]. No SOB has been noted. Her CHF is stable.  Latest K 3.9 and continues to get K supplementation since she takes Lasix for CHF.  PAST MEDICAL HISTORY:  Past Medical History  Diagnosis Date  . NICM (nonischemic cardiomyopathy) (HCC)     EF 35%; cath 2/12 no CAD  . Systolic CHF, chronic (HCC)   . Severe mitral regurgitation     s/p complex mitral valve repair with cox maze + LAA clipping, removal of RV lead, and implantation of CRT-D in abdomen  . Cardiac arrest - ventricular fibrillation     in 1990s  . Atrial fibrillation (HCC)     amiodarone  . HTN (hypertension)   . HLD (hyperlipidemia)   . Achalasia     s/p dilation  . Recurrent UTI   . Obesity   . Anoxic brain injury (HCC) 1996    s/p cardiac arrest; "in a coma for 16 days"  . Automatic implantable cardioverter-defibrillator in situ   . Hypothyroidism   . TIA (transient ischemic attack)   . Chronic kidney disease (CKD), stage IV (severe) (HCC)     [name] 10/03/2013  . Rectal bleeding   . Vaginal bleeding   . Physical deconditioning   . Acute blood loss anemia   . Protein calorie malnutrition (HCC)   . Allergic rhinitis   . Overactive bladder   . Lewy body dementia without behavioral disturbance   . Long term (current) use of  anticoagulants   . Insomnia   . Major depression, chronic (HCC)   . Constipation   . Diuretic-induced hypokalemia   . Cellulitis of right foot      CURRENT MEDICATIONS: Reviewed  Patient's Medications  New Prescriptions   No medications on file  Previous Medications   ACETAMINOPHEN (TYLENOL) 500 MG TABLET    Take 500 mg by mouth at bedtime.   DULOXETINE (CYMBALTA) 20 MG CAPSULE    Take 20 mg by mouth daily. For depression   FLUTICASONE (FLONASE) 50 MCG/ACT NASAL SPRAY    Place 2 sprays into both nostrils at bedtime. For allergies   FOLIC ACID (FOLVITE) 1 MG TABLET    Take 1 tablet by mouth daily.   FUROSEMIDE (LASIX) 80 MG TABLET    Take 160 mg by mouth 2 (two) times daily. Take 2 tabs = 160 mg PO BID   HYDROXYZINE (ATARAX/VISTARIL) 25 MG TABLET    Take 25 mg by mouth 2 (two) times daily as needed for itching.   LEVOTHYROXINE (SYNTHROID, LEVOTHROID) 50 MCG TABLET    Take 50 mcg by mouth daily before breakfast.   LORATADINE (CLARITIN) 10 MG TABLET    Take 10 mg by mouth daily.   MELATONIN 5  MG TABS    Take 5 mg by mouth at bedtime.    METOPROLOL TARTRATE (LOPRESSOR) 25 MG TABLET    Take 12.5 mg by mouth 2 (two) times daily.    MIRABEGRON ER (MYRBETRIQ) 50 MG TB24 TABLET    Take 50 mg by mouth daily.   MULTIPLE VITAMINS-MINERALS (DECUBI-VITE PO)    Take 1 capsule by mouth daily. For wound healing   POLYETHYLENE GLYCOL 3350 (MIRALAX PO)    Take 17 g by mouth 2 (two) times daily as needed.   POTASSIUM CHLORIDE SA (K-DUR,KLOR-CON) 20 MEQ TABLET    Take 40 mEq by mouth 2 (two) times daily.   PROTEIN (PROCEL 100 PO)    Take 1 scoop by mouth 2 (two) times daily.    SACCHAROMYCES BOULARDII (FLORASTOR) 250 MG CAPSULE    Take 250 mg by mouth 2 (two) times daily.   SENNA-DOCUSATE (SENOKOT-S) 8.6-50 MG PER TABLET    Take 1 tablet by mouth at bedtime as needed for mild constipation.   WARFARIN (COUMADIN) 2.5 MG TABLET    Take 2.5 mg by mouth daily.  Modified Medications   No medications on file   Discontinued Medications   No medications on file     No Known Allergies   REVIEW OF SYSTEMS:  GENERAL: no change in appetite, no fatigue, no weight changes, no fever, chills or weakness EYES: Denies change in vision, dry eyes, eye pain, itching or discharge EARS: Denies change in hearing, ringing in ears, or earache NOSE: Denies nasal congestion or epistaxis MOUTH and THROAT: Denies oral discomfort, gingival pain or bleeding, pain from teeth or hoarseness   RESPIRATORY: no cough, SOB, DOE, wheezing, hemoptysis CARDIAC: no chest pain, or palpitations GI: no abdominal pain, diarrhea, constipation, heart burn, nausea or vomiting GU: Denies dysuria, frequency, hematuria, incontinence, or discharge PSYCHIATRIC: Denies feeling of depression or anxiety. No report of hallucinations, insomnia, paranoia, or agitation   PHYSICAL EXAMINATION  GENERAL APPEARANCE: Well nourished. In no acute distress. Obese SKIN:  Skin is warm and dry; has scratch marks on left shin HEAD: Normal in size and contour. No evidence of trauma EYES: Lids open and close normally. No blepharitis, entropion or ectropion. PERRL. Conjunctivae are clear and sclerae are white. Lenses are without opacity EARS: Pinnae are normal. Patient hears normal voice tunes of the examiner MOUTH and THROAT: Lips are without lesions. Oral mucosa is moist and without lesions. Tongue is normal in shape, size, and color and without lesions NECK: supple, trachea midline, no neck masses, no thyroid tenderness, no thyromegaly LYMPHATICS: no LAN in the neck, no supraclavicular LAN RESPIRATORY: breathing is even & unlabored, BS CTAB CARDIAC: no murmur,no extra heart sounds, Irregularly irregular heart rate GI: abdomen soft, normal BS, no masses, no tenderness, no hepatomegaly, no splenomegaly GU:  Has FC EXTREMITIES:  Able to move 4 extremities; generalized weakness of BLE; BLE edema RLE 1+ and LLE 2+ PSYCHIATRIC: Alert and oriented X 3.  Affect and behavior are appropriate  LABS/RADIOLOGY: Labs reviewed: Basic Metabolic Panel:  Recent Labs  95/18/84 1542 12/17/14 0514 12/18/14 0615  04/16/15 1334 04/17/15 0547 04/18/15 0521  06/05/15 07/01/15 08/19/15  NA  --  139 141  < > 141 143 143  < > 141 141 143  K 3.3* 3.7 3.3*  < > 3.8 3.1* 3.7  < > 3.9 3.6 3.9  CL  --  106 105  --  102 105 106  --   --   --   --  CO2  --  23 27  --   --  28 25  --   --   --   --   GLUCOSE  --  100* 110*  --  101* 93 97  --   --   --   --   BUN  --  20 16  < > 28* 20 15  < > 22* 27* 50*  CREATININE  --  1.71* 1.59*  < > 1.80* 1.60* 1.55*  < > 1.5* 1.7* 1.8*  CALCIUM  --  8.3* 8.5*  --   --  8.6* 9.0  --   --   --   --   MG 2.2 2.1  --   --   --   --  2.3  --   --   --   --   < > = values in this interval not displayed. Liver Function Tests:  Recent Labs  12/17/14 0514 12/18/14 0615 04/02/15 04/17/15 0547 08/19/15  AST 22 22 16  16 17 16   ALT 16 16 8  8  9* 9  ALKPHOS 74 71 76  76 63 103  BILITOT 0.8 1.0  --  0.9  --   PROT 6.5 6.7  --  7.3  --   ALBUMIN 2.3* 2.3*  --  2.8*  --     Recent Labs  12/17/14 0524  AMMONIA 23   CBC: CBC Latest Ref Rng 06/30/2015 06/12/2015 06/03/2015  WBC - 3.7 4.5 3.5  Hemoglobin 12.0 - 16.0 g/dL 9.1(A) 8.0(A) 8.8(A)  Hematocrit 36 - 46 % 31(A) 28(A) 30(A)  Platelets 150 - 399 K/L 259 230 234      Lipid Panel:  Recent Labs  12/17/14 0514  HDL 22*   Cardiac Enzymes:  Recent Labs  12/14/14 2126 12/15/14 0239 12/15/14 0814  TROPONINI 0.06* 0.04* 0.06*   CBG:  Recent Labs  04/20/15 1155  GLUCAP 127*     ASSESSMENT/PLAN:  Chronic systolic heart failure with AICD - no SOB; continue metoprolol 25 mg 1/2 tab = 12.5 mg twice a day, Lasix 80 mg take 2 tabs = 160 mg twice a day  Insomnia - continue melatonin 5 mg daily at bedtime  Chronic atrial fibrillation - rate controlled; continue metoprolol 25 mg take 1/2 tab = 12.5 mg twice a day and Coumadin   Hypothyoidism -  continue Levothyroxine 50 mcg 1 trab PO daily;  tsh 1.549  Protein calorie malnutrition -   Continue Procel  1 scoop PO  twice a day  Anemia, acute blood loss -   hgb 9.1, check CBC  Allergic rhinitis - stable; continue Flonase 50 g/ACT 2 sprays into both nostrils daily and loratadine 10 mg daily  Pruritus - start Atarax 25 mg 1 tab PO BID PRN  Hypokalemia - continue KCl 20 MEQ take 2 tabs = 40 MEQ by mouth twice a day; check BMP  Dementia - stable  Depression - mood is stable; continue Cymbalta 20 mg daily  Constipation - continue MiraLAX 17 g twice a day when necessary and senna S1 tab by mouth daily at bedtime when necessary  CKD stage 3 - creatinine 1.8; check BMP     Goals of care:  Long-term care    Kenard Gower, NP [facility] [phone]

## 2015-09-14 LAB — CBC AND DIFFERENTIAL
HCT: 31 % — AB (ref 36–46)
Hemoglobin: 9.4 g/dL — AB (ref 12.0–16.0)
Neutrophils Absolute: 2 /uL
PLATELETS: 231 10*3/uL (ref 150–399)
WBC: 3.3 10*3/mL

## 2015-09-14 LAB — BASIC METABOLIC PANEL
BUN: 29 mg/dL — AB (ref 4–21)
CREATININE: 1.6 mg/dL — AB (ref 0.5–1.1)
GLUCOSE: 93 mg/dL
POTASSIUM: 3.7 mmol/L (ref 3.4–5.3)
Sodium: 145 mmol/L (ref 137–147)

## 2015-09-18 ENCOUNTER — Encounter: Payer: Self-pay | Admitting: Adult Health

## 2015-09-18 ENCOUNTER — Non-Acute Institutional Stay (SKILLED_NURSING_FACILITY): Payer: 59 | Admitting: Adult Health

## 2015-09-18 DIAGNOSIS — I482 Chronic atrial fibrillation, unspecified: Secondary | ICD-10-CM

## 2015-09-18 DIAGNOSIS — Z7901 Long term (current) use of anticoagulants: Secondary | ICD-10-CM

## 2015-09-18 NOTE — Progress Notes (Signed)
Patient ID: CHERESA STAYTON, female   DOB: 08-30-1940, 75 y.o.   MRN: [id number] Subjective:     Indication: atrial fibrillation Bleeding signs/symptoms: None Thromboembolic signs/symptoms: None  Missed Coumadin doses: None Medication changes: no Dietary changes: no Bacterial/viral infection: no Other concerns: no  The following portions of the patient's history were reviewed and updated as appropriate: allergies, current medications, past family history, past medical history, past social history, past surgical history and problem list.  Review of Systems A comprehensive review of systems was negative.   Objective:    INR Today: 1.8 Current dose: Coumadin 2.5 mg daily     Assessment:    Subtherapeutic INR for goal of 2-3   Plan:    1. New dose: Discontinue Coumadin 2.5 mg; increase Coumadin 3 mg daily   2. Next INR: 09/22/15

## 2015-10-19 ENCOUNTER — Encounter: Payer: Self-pay | Admitting: Adult Health

## 2015-10-19 ENCOUNTER — Non-Acute Institutional Stay (SKILLED_NURSING_FACILITY): Payer: 59 | Admitting: Adult Health

## 2015-10-19 DIAGNOSIS — I482 Chronic atrial fibrillation, unspecified: Secondary | ICD-10-CM

## 2015-10-19 DIAGNOSIS — Z7901 Long term (current) use of anticoagulants: Secondary | ICD-10-CM

## 2015-10-19 NOTE — Progress Notes (Signed)
Patient ID: Margaret [name], female   DOB: July 22, 1940, 75 y.o.   MRN: [id number] Subjective:     Indication: atrial fibrillation Bleeding signs/symptoms: None Thromboembolic signs/symptoms: None  Missed Coumadin doses: This week - 3 due to supratherapeutic INR Medication changes: no Dietary changes: no Bacterial/viral infection: no Other concerns: no  The following portions of the patient's history were reviewed and updated as appropriate: allergies, current medications, past family history, past medical history, past social history, past surgical history and problem list.  Review of Systems A comprehensive review of systems was negative.   Objective:    INR Today: 2.1 Current dose: Coumadin 3.5 mg     Assessment:    Therapeutic INR for goal of 2-3   Plan:    1. New dose: Decrease Coumadin to 2.5 mg daily   2. Next INR: 10/22/15

## 2015-10-22 ENCOUNTER — Non-Acute Institutional Stay (SKILLED_NURSING_FACILITY): Payer: 59 | Admitting: Adult Health

## 2015-10-22 ENCOUNTER — Encounter: Payer: Self-pay | Admitting: Adult Health

## 2015-10-22 DIAGNOSIS — J309 Allergic rhinitis, unspecified: Secondary | ICD-10-CM

## 2015-10-22 DIAGNOSIS — N183 Chronic kidney disease, stage 3 unspecified: Secondary | ICD-10-CM

## 2015-10-22 DIAGNOSIS — E039 Hypothyroidism, unspecified: Secondary | ICD-10-CM | POA: Diagnosis not present

## 2015-10-22 DIAGNOSIS — F329 Major depressive disorder, single episode, unspecified: Secondary | ICD-10-CM | POA: Diagnosis not present

## 2015-10-22 DIAGNOSIS — D62 Acute posthemorrhagic anemia: Secondary | ICD-10-CM

## 2015-10-22 DIAGNOSIS — I482 Chronic atrial fibrillation, unspecified: Secondary | ICD-10-CM

## 2015-10-22 DIAGNOSIS — G4700 Insomnia, unspecified: Secondary | ICD-10-CM | POA: Diagnosis not present

## 2015-10-22 DIAGNOSIS — E46 Unspecified protein-calorie malnutrition: Secondary | ICD-10-CM | POA: Diagnosis not present

## 2015-10-22 DIAGNOSIS — K5900 Constipation, unspecified: Secondary | ICD-10-CM

## 2015-10-22 DIAGNOSIS — L03115 Cellulitis of right lower limb: Secondary | ICD-10-CM

## 2015-10-22 DIAGNOSIS — L299 Pruritus, unspecified: Secondary | ICD-10-CM

## 2015-10-22 DIAGNOSIS — F0280 Dementia in other diseases classified elsewhere without behavioral disturbance: Secondary | ICD-10-CM

## 2015-10-22 DIAGNOSIS — E876 Hypokalemia: Secondary | ICD-10-CM | POA: Diagnosis not present

## 2015-10-22 DIAGNOSIS — I5022 Chronic systolic (congestive) heart failure: Secondary | ICD-10-CM

## 2015-10-22 DIAGNOSIS — G3183 Dementia with Lewy bodies: Secondary | ICD-10-CM

## 2015-10-22 NOTE — Progress Notes (Signed)
Patient ID: NEESHA GOLAY, female   DOB: 1940-09-14, 75 y.o.   MRN: [id number]    DATE:     10/22/15  MRN:  [id number]  BIRTHDAY: 04-06-1941  Facility:  Nursing Home Location:  [facility]  Nursing Home Room Number: 803-A  LEVEL OF CARE:  SNF [phone])  Contact Information    Name Relation Home Work [place] Daughter [phone]  [phone]   Keilana, Mayerhofer) Spouse [phone]  [phone]   Tyquesha, Mcconathy   [phone]       Chief Complaint  Patient presents with  . Medical Management of Chronic Issues    HISTORY OF PRESENT ILLNESS:  This is a 75 year old female who is being seen for a routine visit. She is a long-term resident at [facility]. Noted to have scab on her right lower leg with scab and has slight serous drainage. No foul odor noted. Skin has slight erythema. No SOB.   PAST MEDICAL HISTORY:  Past Medical History  Diagnosis Date  . NICM (nonischemic cardiomyopathy) (HCC)     EF 35%; cath 2/12 no CAD  . Systolic CHF, chronic (HCC)   . Severe mitral regurgitation     s/p complex mitral valve repair with cox maze + LAA clipping, removal of RV lead, and implantation of CRT-D in abdomen  . Cardiac arrest - ventricular fibrillation     in 1990s  . Atrial fibrillation (HCC)     amiodarone  . HTN (hypertension)   . HLD (hyperlipidemia)   . Achalasia     s/p dilation  . Recurrent UTI   . Obesity   . Anoxic brain injury (HCC) 1996    s/p cardiac arrest; "in a coma for 16 days"  . Automatic implantable cardioverter-defibrillator in situ   . Hypothyroidism   . TIA (transient ischemic attack)   . Chronic kidney disease (CKD), stage IV (severe) (HCC)     [name] 10/03/2013  . Rectal bleeding   . Vaginal bleeding   . Physical deconditioning   . Acute blood loss anemia   . Protein calorie malnutrition (HCC)   . Allergic rhinitis   . Overactive bladder   . Lewy body dementia without behavioral disturbance   . Long term  (current) use of anticoagulants   . Insomnia   . Major depression, chronic (HCC)   . Constipation   . Diuretic-induced hypokalemia   . Cellulitis of right foot      CURRENT MEDICATIONS: Reviewed  Patient's Medications  New Prescriptions   No medications on file  Previous Medications   ACETAMINOPHEN (TYLENOL) 500 MG TABLET    Take 500 mg by mouth at bedtime.   DULOXETINE (CYMBALTA) 20 MG CAPSULE    Take 20 mg by mouth daily. For depression   FLUTICASONE (FLONASE) 50 MCG/ACT NASAL SPRAY    Place 2 sprays into both nostrils at bedtime. For allergies   FOLIC ACID (FOLVITE) 1 MG TABLET    Take 1 tablet by mouth daily.   FUROSEMIDE (LASIX) 80 MG TABLET    Take 160 mg by mouth 2 (two) times daily. Take 2 tabs = 160 mg PO BID   HYDROXYZINE (ATARAX/VISTARIL) 25 MG TABLET    Take 25 mg by mouth 2 (two) times daily as needed for itching.   LEVOTHYROXINE (SYNTHROID, LEVOTHROID) 50 MCG TABLET    Take 50 mcg by mouth daily before breakfast.   LORATADINE (CLARITIN) 10 MG TABLET    Take 10 mg by mouth daily.  MELATONIN 5 MG TABS    Take 5 mg by mouth at bedtime.    METOPROLOL TARTRATE (LOPRESSOR) 25 MG TABLET    Take 12.5 mg by mouth 2 (two) times daily.    MIRABEGRON ER (MYRBETRIQ) 50 MG TB24 TABLET    Take 50 mg by mouth daily.   MULTIPLE VITAMINS-MINERALS (DECUBI-VITE PO)    Take 1 capsule by mouth daily. For wound healing   POLYETHYLENE GLYCOL 3350 (MIRALAX PO)    Take 17 g by mouth 2 (two) times daily as needed.   POTASSIUM CHLORIDE SA (K-DUR,KLOR-CON) 20 MEQ TABLET    Take 40 mEq by mouth 2 (two) times daily.   PROTEIN (PROCEL 100 PO)    Take 1 scoop by mouth 2 (two) times daily.    SACCHAROMYCES BOULARDII (FLORASTOR) 250 MG CAPSULE    Take 250 mg by mouth 2 (two) times daily.   SENNA-DOCUSATE (SENOKOT-S) 8.6-50 MG PER TABLET    Take 1 tablet by mouth at bedtime as needed for mild constipation.   WARFARIN (COUMADIN) 2.5 MG TABLET    Take 2.5 mg by mouth daily.  Modified Medications   No  medications on file  Discontinued Medications   WARFARIN (COUMADIN) 3 MG TABLET    Take 3 mg by mouth daily.     No Known Allergies   REVIEW OF SYSTEMS:  GENERAL: no change in appetite, no fatigue, no weight changes, no fever, chills or weakness EYES: Denies change in vision, dry eyes, eye pain, itching or discharge EARS: Denies change in hearing, ringing in ears, or earache NOSE: Denies nasal congestion or epistaxis MOUTH and THROAT: Denies oral discomfort, gingival pain or bleeding, pain from teeth or hoarseness   RESPIRATORY: no cough, SOB, DOE, wheezing, hemoptysis CARDIAC: no chest pain, or palpitations GI: no abdominal pain, diarrhea, constipation, heart burn, nausea or vomiting GU: Denies dysuria, frequency, hematuria, incontinence, or discharge PSYCHIATRIC: Denies feeling of depression or anxiety. No report of hallucinations, insomnia, paranoia, or agitation   PHYSICAL EXAMINATION  GENERAL APPEARANCE: Well nourished. In no acute distress. Obese SKIN:  Right lower leg has scab with minimal serous drainage, slight erythema HEAD: Normal in size and contour. No evidence of trauma EYES: Lids open and close normally. No blepharitis, entropion or ectropion. PERRL. Conjunctivae are clear and sclerae are white. Lenses are without opacity EARS: Pinnae are normal. Patient hears normal voice tunes of the examiner MOUTH and THROAT: Lips are without lesions. Oral mucosa is moist and without lesions. Tongue is normal in shape, size, and color and without lesions NECK: supple, trachea midline, no neck masses, no thyroid tenderness, no thyromegaly LYMPHATICS: no LAN in the neck, no supraclavicular LAN RESPIRATORY: breathing is even & unlabored, BS CTAB CARDIAC: no murmur,no extra heart sounds, Irregularly irregular heart rate GI: abdomen soft, normal BS, no masses, no tenderness, no hepatomegaly, no splenomegaly GU:  Has FC EXTREMITIES:  Able to move 4 extremities; generalized weakness  of BLE; BLE edema 1+ PSYCHIATRIC: Alert and oriented X 3. Affect and behavior are appropriate  LABS/RADIOLOGY: Labs reviewed: Basic Metabolic Panel:  Recent Labs  16/10/96 1542 12/17/14 0514 12/18/14 0615  04/16/15 1334 04/17/15 0547 04/18/15 0521  07/01/15 08/19/15 09/14/15  NA  --  139 141  < > 141 143 143  < > 141 143 145  K 3.3* 3.7 3.3*  < > 3.8 3.1* 3.7  < > 3.6 3.9 3.7  CL  --  106 105  --  102 105 106  --   --   --   --  CO2  --  23 27  --   --  28 25  --   --   --   --   GLUCOSE  --  100* 110*  --  101* 93 97  --   --   --   --   BUN  --  20 16  < > 28* 20 15  < > 27* 50* 29*  CREATININE  --  1.71* 1.59*  < > 1.80* 1.60* 1.55*  < > 1.7* 1.8* 1.6*  CALCIUM  --  8.3* 8.5*  --   --  8.6* 9.0  --   --   --   --   MG 2.2 2.1  --   --   --   --  2.3  --   --   --   --   < > = values in this interval not displayed. Liver Function Tests:  Recent Labs  12/17/14 0514 12/18/14 0615 04/02/15 04/17/15 0547 08/19/15  AST 22 22 16  16 17 16   ALT 16 16 8  8  9* 9  ALKPHOS 74 71 76  76 63 103  BILITOT 0.8 1.0  --  0.9  --   PROT 6.5 6.7  --  7.3  --   ALBUMIN 2.3* 2.3*  --  2.8*  --     Recent Labs  12/17/14 0524  AMMONIA 23   Lab Results  Component Value Date   WBC 3.3 09/14/2015   HGB 9.4* 09/14/2015   HCT 31* 09/14/2015   MCV 91.6 04/22/2015   PLT 231 09/14/2015    Lipid Panel:  Recent Labs  12/17/14 0514  HDL 22*   Cardiac Enzymes:  Recent Labs  12/14/14 2126 12/15/14 0239 12/15/14 0814  TROPONINI 0.06* 0.04* 0.06*   CBG:  Recent Labs  04/20/15 1155  GLUCAP 127*     ASSESSMENT/PLAN:  Cellulitis of RLE - start Bactroban ointment 2 % to right leg scab BID X 2 weeks  Chronic systolic heart failure with AICD - no SOB; continue metoprolol 25 mg 1/2 tab = 12.5 mg twice a day, Lasix 80 mg take 2 tabs = 160 mg twice a day  Insomnia - continue melatonin 5 mg daily at bedtime  Chronic atrial fibrillation - rate controlled; continue  metoprolol 25 mg take 1/2 tab = 12.5 mg twice a day and Coumadin   Hypothyoidism - continue Levothyroxine 50 mcg 1 trab PO daily;  tsh 1.549  Protein calorie malnutrition -   Continue Procel  1 scoop PO  twice a day  Anemia, acute blood loss -   stable Lab Results  Component Value Date   HGB 9.4* 09/14/2015    Allergic rhinitis - stable; continue Flonase 50 g/ACT 2 sprays into both nostrils daily and loratadine 10 mg daily  Pruritus - continue Atarax 25 mg 1 tab PO BID PRN  Hypokalemia - continue KCl 20 MEQ take 2 tabs = 40 MEQ by mouth twice a day; check BMP  Dementia - stable  Depression - mood is stable; continue Cymbalta 20 mg daily  Constipation - continue MiraLAX 17 g twice a day when necessary and senna S1 tab by mouth daily at bedtime when necessary  CKD stage 3 -  check BMP Lab Results  Component Value Date   CREATININE 1.6* 09/14/2015        Goals of care:  Long-term care    Kenard Gower, NP [facility] [phone]

## 2015-10-23 LAB — BASIC METABOLIC PANEL
BUN: 36 mg/dL — AB (ref 4–21)
Creatinine: 1.6 mg/dL — AB (ref 0.5–1.1)
Glucose: 96 mg/dL
Potassium: 4 mmol/L (ref 3.4–5.3)
SODIUM: 144 mmol/L (ref 137–147)

## 2015-11-10 LAB — BASIC METABOLIC PANEL WITH GFR
BUN: 30 mg/dL — AB (ref 4–21)
Creatinine: 1.4 mg/dL — AB (ref 0.5–1.1)
Glucose: 100 mg/dL
Potassium: 3.8 mmol/L (ref 3.4–5.3)
Sodium: 142 mmol/L (ref 137–147)

## 2015-11-12 ENCOUNTER — Encounter: Payer: Self-pay | Admitting: Adult Health

## 2015-11-12 ENCOUNTER — Non-Acute Institutional Stay (SKILLED_NURSING_FACILITY): Payer: 59 | Admitting: Adult Health

## 2015-11-12 DIAGNOSIS — L299 Pruritus, unspecified: Secondary | ICD-10-CM | POA: Diagnosis not present

## 2015-11-12 DIAGNOSIS — J309 Allergic rhinitis, unspecified: Secondary | ICD-10-CM

## 2015-11-12 DIAGNOSIS — K5900 Constipation, unspecified: Secondary | ICD-10-CM

## 2015-11-12 DIAGNOSIS — E876 Hypokalemia: Secondary | ICD-10-CM | POA: Diagnosis not present

## 2015-11-12 DIAGNOSIS — F0280 Dementia in other diseases classified elsewhere without behavioral disturbance: Secondary | ICD-10-CM

## 2015-11-12 DIAGNOSIS — B372 Candidiasis of skin and nail: Secondary | ICD-10-CM | POA: Diagnosis not present

## 2015-11-12 DIAGNOSIS — E46 Unspecified protein-calorie malnutrition: Secondary | ICD-10-CM | POA: Diagnosis not present

## 2015-11-12 DIAGNOSIS — G3183 Dementia with Lewy bodies: Secondary | ICD-10-CM

## 2015-11-12 DIAGNOSIS — N183 Chronic kidney disease, stage 3 unspecified: Secondary | ICD-10-CM

## 2015-11-12 DIAGNOSIS — I482 Chronic atrial fibrillation, unspecified: Secondary | ICD-10-CM

## 2015-11-12 DIAGNOSIS — F329 Major depressive disorder, single episode, unspecified: Secondary | ICD-10-CM

## 2015-11-12 DIAGNOSIS — E039 Hypothyroidism, unspecified: Secondary | ICD-10-CM

## 2015-11-12 DIAGNOSIS — D62 Acute posthemorrhagic anemia: Secondary | ICD-10-CM

## 2015-11-12 DIAGNOSIS — I5022 Chronic systolic (congestive) heart failure: Secondary | ICD-10-CM

## 2015-11-12 DIAGNOSIS — G4700 Insomnia, unspecified: Secondary | ICD-10-CM | POA: Diagnosis not present

## 2015-11-12 DIAGNOSIS — N3289 Other specified disorders of bladder: Secondary | ICD-10-CM | POA: Diagnosis not present

## 2015-11-12 NOTE — Progress Notes (Signed)
Patient ID: Margaret [name], female   DOB: [birth year], 75 y.o.   MRN: [id number]    DATE:     11/12/15  MRN:  [id number]  BIRTHDAY: January 03, 1941  Facility:  Nursing Home Location:  [facility]  Nursing Home Room Number: 803-A  LEVEL OF CARE:  SNF [phone])  Contact Information    Name Relation Home Work [place] Daughter [phone]  [phone]   [name], [name] Spouse [phone]  [phone]   [name], [name]   [phone]       Chief Complaint  Patient presents with  . Medical Management of Chronic Issues    HISTORY OF PRESENT ILLNESS:  This is a 75 year old female who is being seen for a routine visit. She is a long-term resident at [facility]. She was noted to have erythematous rashes on her abdominal folds and bilateral groin. She said that they itch a lot. No SOB noted. Hgb 9.4, stable.   PAST MEDICAL HISTORY:  Past Medical History  Diagnosis Date  . NICM (nonischemic cardiomyopathy) (HCC)     EF 35%; cath 2/12 no CAD  . Systolic CHF, chronic (HCC)   . Severe mitral regurgitation     s/p complex mitral valve repair with cox maze + LAA clipping, removal of RV lead, and implantation of CRT-D in abdomen  . Cardiac arrest - ventricular fibrillation     in 1990s  . Atrial fibrillation (HCC)     amiodarone  . HTN (hypertension)   . HLD (hyperlipidemia)   . Achalasia     s/p dilation  . Recurrent UTI   . Obesity   . Anoxic brain injury (HCC) 1996    s/p cardiac arrest; "in a coma for 16 days"  . Automatic implantable cardioverter-defibrillator in situ   . Hypothyroidism   . TIA (transient ischemic attack)   . Chronic kidney disease (CKD), stage IV (severe) (HCC)     [name] 10/03/2013  . Rectal bleeding   . Vaginal bleeding   . Physical deconditioning   . Acute blood loss anemia   . Protein calorie malnutrition (HCC)   . Allergic rhinitis   . Overactive bladder   . Lewy body dementia without behavioral disturbance   . Long  term (current) use of anticoagulants   . Insomnia   . Major depression, chronic (HCC)   . Constipation   . Diuretic-induced hypokalemia   . Cellulitis of right foot      CURRENT MEDICATIONS: Reviewed  Patient's Medications  New Prescriptions   No medications on file  Previous Medications   ACETAMINOPHEN (TYLENOL) 500 MG TABLET    Take 500 mg by mouth at bedtime.   DULOXETINE (CYMBALTA) 20 MG CAPSULE    Take 20 mg by mouth daily. For depression   FLUTICASONE (FLONASE) 50 MCG/ACT NASAL SPRAY    Place 2 sprays into both nostrils at bedtime. For allergies   FOLIC ACID (FOLVITE) 1 MG TABLET    Take 1 tablet by mouth daily.   FUROSEMIDE (LASIX) 80 MG TABLET    Take 160 mg by mouth 2 (two) times daily. Take 2 tabs = 160 mg PO BID   HYDROXYZINE (ATARAX/VISTARIL) 25 MG TABLET    Take 25 mg by mouth 2 (two) times daily as needed for itching.   LEVOTHYROXINE (SYNTHROID, LEVOTHROID) 50 MCG TABLET    Take 50 mcg by mouth daily before breakfast.   LORATADINE (CLARITIN) 10 MG TABLET    Take 10 mg by mouth daily.  MELATONIN 5 MG TABS    Take 5 mg by mouth at bedtime.    METOPROLOL TARTRATE (LOPRESSOR) 25 MG TABLET    Take 12.5 mg by mouth 2 (two) times daily.    MIRABEGRON ER (MYRBETRIQ) 50 MG TB24 TABLET    Take 50 mg by mouth daily.   MULTIPLE VITAMINS-MINERALS (DECUBI-VITE PO)    Take 1 capsule by mouth daily. For wound healing   POLYETHYLENE GLYCOL 3350 (MIRALAX PO)    Take 17 g by mouth 2 (two) times daily as needed.   POTASSIUM CHLORIDE SA (K-DUR,KLOR-CON) 20 MEQ TABLET    Take 40 mEq by mouth 2 (two) times daily.   PROTEIN (PROCEL 100 PO)    Take 1 scoop by mouth 2 (two) times daily.    SACCHAROMYCES BOULARDII (FLORASTOR) 250 MG CAPSULE    Take 250 mg by mouth 2 (two) times daily.   SENNA-DOCUSATE (SENOKOT-S) 8.6-50 MG PER TABLET    Take 1 tablet by mouth at bedtime as needed for mild constipation.   WARFARIN (COUMADIN) 3 MG TABLET    Take 3 mg by mouth daily.  Modified Medications   No  medications on file  Discontinued Medications   WARFARIN (COUMADIN) 2.5 MG TABLET    Take 2.5 mg by mouth daily.     No Known Allergies   REVIEW OF SYSTEMS:  GENERAL: no change in appetite, no fatigue, no weight changes, no fever, chills or weakness EYES: Denies change in vision, dry eyes, eye pain, itching or discharge EARS: Denies change in hearing, ringing in ears, or earache NOSE: Denies nasal congestion or epistaxis MOUTH and THROAT: Denies oral discomfort, gingival pain or bleeding, pain from teeth or hoarseness   RESPIRATORY: no cough, SOB, DOE, wheezing, hemoptysis CARDIAC: no chest pain, or palpitations GI: no abdominal pain, diarrhea, constipation, heart burn, nausea or vomiting GU: Denies dysuria, frequency, hematuria, incontinence, or discharge PSYCHIATRIC: Denies feeling of depression or anxiety. No report of hallucinations, insomnia, paranoia, or agitation   PHYSICAL EXAMINATION  GENERAL APPEARANCE: Well nourished. In no acute distress. Obese SKIN:  Bilateral lower legs has scabs and scratch marks; erythematous rashes on abdominal folds and bilateral groin HEAD: Normal in size and contour. No evidence of trauma EYES: Lids open and close normally. No blepharitis, entropion or ectropion. PERRL. Conjunctivae are clear and sclerae are white. Lenses are without opacity EARS: Pinnae are normal. Patient hears normal voice tunes of the examiner MOUTH and THROAT: Lips are without lesions. Oral mucosa is moist and without lesions. Tongue is normal in shape, size, and color and without lesions NECK: supple, trachea midline, no neck masses, no thyroid tenderness, no thyromegaly LYMPHATICS: no LAN in the neck, no supraclavicular LAN RESPIRATORY: breathing is even & unlabored, BS CTAB CARDIAC: no murmur,no extra heart sounds, Irregularly irregular heart rate GI: abdomen soft, normal BS, no masses, no tenderness, no hepatomegaly, no splenomegaly GU:  Has FC EXTREMITIES:  Able to  move 4 extremities; generalized weakness of BLE; BLE edema 1+ PSYCHIATRIC: Alert to place and person, disoriented to time. Affect and behavior are appropriate  LABS/RADIOLOGY: Labs reviewed: Basic Metabolic Panel:  Recent Labs  14/78/29 1542 12/17/14 0514 12/18/14 0615  04/16/15 1334 04/17/15 0547 04/18/15 0521  08/19/15 09/14/15 10/23/15  NA  --  139 141  < > 141 143 143  < > 143 145 144  K 3.3* 3.7 3.3*  < > 3.8 3.1* 3.7  < > 3.9 3.7 4.0  CL  --  106 105  --  102 105 106  --   --   --   --   CO2  --  23 27  --   --  28 25  --   --   --   --   GLUCOSE  --  100* 110*  --  101* 93 97  --   --   --   --   BUN  --  20 16  < > 28* 20 15  < > 50* 29* 36*  CREATININE  --  1.71* 1.59*  < > 1.80* 1.60* 1.55*  < > 1.8* 1.6* 1.6*  CALCIUM  --  8.3* 8.5*  --   --  8.6* 9.0  --   --   --   --   MG 2.2 2.1  --   --   --   --  2.3  --   --   --   --   < > = values in this interval not displayed. Liver Function Tests:  Recent Labs  12/17/14 0514 12/18/14 0615 04/02/15 04/17/15 0547 08/19/15  AST ALT 9* 9  ALKPHOS 74 71 76  76 63 103  BILITOT 0.8 1.0  --  0.9  --   PROT 6.5 6.7  --  7.3  --   ALBUMIN 2.3* 2.3*  --  2.8*  --     Recent Labs  12/17/14 0524  AMMONIA 23   Lab Results  Component Value Date   WBC 3.3 09/14/2015   HGB 9.4* 09/14/2015   HCT 31* 09/14/2015   MCV 91.6 04/22/2015   PLT 231 09/14/2015    Lipid Panel:  Recent Labs  12/17/14 0514  HDL 22*   Cardiac Enzymes:  Recent Labs  12/14/14 2126 12/15/14 0239 12/15/14 0814  TROPONINI 0.06* 0.04* 0.06*   CBG:  Recent Labs  04/20/15 1155  GLUCAP 127*     ASSESSMENT/PLAN:  Candida, skin - start Nystatin cream to rashes on abdominal folds and bilateral groin  Insomnia - continue Melatonin 5 mg Q HS  Chronic systolic heart failure with AICD - no SOB; continue metoprolol 25 mg 1/2 tab = 12.5 mg twice a day, Lasix 80 mg take 2 tabs = 160 mg twice a day; check  BMP  Chronic atrial fibrillation - rate controlled; continue metoprolol 25 mg take 1/2 tab = 12.5 mg twice a day and Coumadin   Hypothyoidism - continue Levothyroxine 50 mcg 1 trab PO daily  Protein calorie malnutrition -   Continue Procel  1 scoop PO  twice a day  Anemia, acute blood loss -   stable Lab Results  Component Value Date   HGB 9.4* 09/14/2015    Allergic rhinitis - stable; continue Flonase 50 g/ACT 2 sprays into both nostrils daily and loratadine 10 mg daily  Pruritus - continue Atarax 25 mg 1 tab PO BID PRN  Hypokalemia - continue KCl 20 MEQ take 2 tabs = 40 MEQ by mouth twice a day Lab Results  Component Value Date   K 4.0 10/23/2015    Dementia - stable  Depression - mood is stable; continue Cymbalta 20 mg daily  Constipation - continue MiraLAX 17 g twice a day when necessary and senna S1 tab by mouth daily at bedtime when necessary  CKD stage 3 -  check BMP Lab Results  Component Value Date   CREATININE 1.6* 10/23/2015   Bladder spasm - continue  Myrbetriq ER 50 mg 1 tab PO daily     Goals of care:  Long-term care    Kenard Gower, NP [facility] [phone]

## 2015-11-13 LAB — BASIC METABOLIC PANEL
BUN: 30 mg/dL — AB (ref 4–21)
Creatinine: 1.4 mg/dL — AB (ref 0.5–1.1)
Glucose: 92 mg/dL
POTASSIUM: 3.9 mmol/L (ref 3.4–5.3)
SODIUM: 143 mmol/L (ref 137–147)

## 2015-11-13 LAB — HEPATIC FUNCTION PANEL
ALT: 11 U/L (ref 7–35)
AST: 18 U/L (ref 13–35)
Alkaline Phosphatase: 87 U/L (ref 25–125)
Bilirubin, Total: 0.6 mg/dL

## 2015-11-25 ENCOUNTER — Non-Acute Institutional Stay (SKILLED_NURSING_FACILITY): Payer: 59 | Admitting: Adult Health

## 2015-11-25 ENCOUNTER — Encounter: Payer: Self-pay | Admitting: Adult Health

## 2015-11-25 DIAGNOSIS — Z7901 Long term (current) use of anticoagulants: Secondary | ICD-10-CM | POA: Diagnosis not present

## 2015-11-25 DIAGNOSIS — I482 Chronic atrial fibrillation, unspecified: Secondary | ICD-10-CM

## 2015-11-25 NOTE — Progress Notes (Signed)
Patient ID: Margaret PLICHTA, female   DOB: Mar 28, 1941, 75 y.o.   MRN: [id number] Subjective:     Indication: atrial fibrillation Bleeding signs/symptoms: None Thromboembolic signs/symptoms: None  Missed Coumadin doses: This week - 1 Medication changes: no Dietary changes: no Bacterial/viral infection: no Other concerns: no  The following portions of the patient's history were reviewed and updated as appropriate: allergies, current medications, past family history, past medical history, past social history, past surgical history and problem list.  Review of Systems A comprehensive review of systems was negative.   Objective:    INR Today: 2.6 Current dose: Coumadin 3 mg daily    Assessment:    Therapeutic INR for goal of 2-3   Plan:    1. New dose: Decrease Coumadin to 2.5 mg daily   2. Next INR: 11/27/15

## 2015-12-09 ENCOUNTER — Encounter: Payer: Self-pay | Admitting: Internal Medicine

## 2015-12-09 ENCOUNTER — Non-Acute Institutional Stay (SKILLED_NURSING_FACILITY): Payer: 59 | Admitting: Internal Medicine

## 2015-12-09 DIAGNOSIS — N3281 Overactive bladder: Secondary | ICD-10-CM

## 2015-12-09 DIAGNOSIS — N183 Chronic kidney disease, stage 3 unspecified: Secondary | ICD-10-CM

## 2015-12-09 DIAGNOSIS — I5022 Chronic systolic (congestive) heart failure: Secondary | ICD-10-CM | POA: Diagnosis not present

## 2015-12-09 DIAGNOSIS — E038 Other specified hypothyroidism: Secondary | ICD-10-CM | POA: Diagnosis not present

## 2015-12-09 DIAGNOSIS — F329 Major depressive disorder, single episode, unspecified: Secondary | ICD-10-CM | POA: Diagnosis not present

## 2015-12-09 DIAGNOSIS — F424 Excoriation (skin-picking) disorder: Secondary | ICD-10-CM

## 2015-12-09 DIAGNOSIS — E876 Hypokalemia: Secondary | ICD-10-CM

## 2015-12-09 DIAGNOSIS — I878 Other specified disorders of veins: Secondary | ICD-10-CM | POA: Diagnosis not present

## 2015-12-09 NOTE — Progress Notes (Signed)
Patient ID: Margaret [name], female   DOB: 1941-03-19, 75 y.o.   MRN: [id number]      [facility]  PCP: [name], MD  Code Status: Full Code   No Known Allergies  Chief Complaint  Patient presents with  . Medical Management of Chronic Issues    Routine Visit     HPI:  75 y.o. patient is here for long term care and is seen today for routine visit. She is seen in her room. She denies any concern this visit. Per nursing and treatment nurse, she has been picking on her skin of the legs causing it to bleed. Few months back, treatment nurse had applied eucerin cream with triamcinolone cream and wrapped her legs, following which this had resolved. On removing of wrap, she has started scratching her legs again. She is aware about scratching and picking on her skin. She denies any itching. She mentions not being able to control this. No fall reported. She feeds herself. She gets around with her wheelchair.    Review of Systems:  Constitutional: Negative for fever  HENT: Negative for headache, nasal discharge Eyes: Negative for blurred vision Respiratory: Negative for cough, shortness of breath and wheezing.   Cardiovascular: Negative for chest pain, palpitations, leg swelling.  Gastrointestinal: Negative for heartburn, nausea, vomiting, abdominal pain. Has regular bowel movement Genitourinary: Negative for dysuria  Musculoskeletal: Negative for fall Skin: Negative for rash.  Neurological: Negative for dizziness Psychiatric/Behavioral: Negative for depression    Past Medical History:  Diagnosis Date  . Achalasia    s/p dilation  . Acute blood loss anemia   . Allergic rhinitis   . Anoxic brain injury (HCC) 1996   s/p cardiac arrest; "in a coma for 16 days"  . Atrial fibrillation (HCC)    amiodarone  . Automatic implantable cardioverter-defibrillator in situ   . Cardiac arrest - ventricular fibrillation    in 1990s  . Cellulitis of right foot   . Chronic  kidney disease (CKD), stage IV (severe) (HCC)    [name] 10/03/2013  . Constipation   . Diuretic-induced hypokalemia   . HLD (hyperlipidemia)   . HTN (hypertension)   . Hypothyroidism   . Insomnia   . Lewy body dementia without behavioral disturbance   . Long term (current) use of anticoagulants   . Major depression, chronic (HCC)   . NICM (nonischemic cardiomyopathy) (HCC)    EF 35%; cath 2/12 no CAD  . Obesity   . Overactive bladder   . Physical deconditioning   . Protein calorie malnutrition (HCC)   . Rectal bleeding   . Recurrent UTI   . Severe mitral regurgitation    s/p complex mitral valve repair with cox maze + LAA clipping, removal of RV lead, and implantation of CRT-D in abdomen  . Systolic CHF, chronic (HCC)   . TIA (transient ischemic attack)   . Vaginal bleeding    Past Surgical History:  Procedure Laterality Date  . Attempted implantation of an implantable cardioverter-  12/30/2004  . CARDIOVERSION  04/14/2010  . CATARACT EXTRACTION, BILATERAL    . CATARACT EXTRACTION, BILATERAL Bilateral   . 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 Cox maze procedure (complete biatrial lesion set with clipping of  07/16/2010  . CYSTOSCOPY W/ STONE MANIPULATION    . Defibrillator generator explantation and reimplantation; and  08/02/2001  . Device migration with anticipated pocket revision  10/29/2003  . FLEXIBLE SIGMOIDOSCOPY N/A  04/17/2015   Procedure: FLEXIBLE SIGMOIDOSCOPY;  Surgeon: Carman Ching, MD;  Location: [facility] ENDOSCOPY;  Service: Endoscopy;  Laterality: N/A;  . LAPAROSCOPIC CHOLECYSTECTOMY    . MEDIAN STERNOTOMY  07/16/2010  . MITRAL VALVE REPAIR  07/16/2010  . Placement of dual chamber pacemaker and implantable cardiac  07/16/2010  . Placement of [place] Ganz pulmonary artery catheter via left femoral access.  07/16/2010  . Removal of old endocardial right ventricular defibrillator lead.  07/16/2010      Medications:     Medication List       Accurate as of 12/09/15  2:22 PM. Always use your most recent med list.          acetaminophen 500 MG tablet Commonly known as:  TYLENOL Take 500 mg by mouth at bedtime.   DECUBI-VITE PO Take 1 capsule by mouth daily. For wound healing   DULoxetine 20 MG capsule Commonly known as:  CYMBALTA Take 20 mg by mouth daily. For depression   fluticasone 50 MCG/ACT nasal spray Commonly known as:  FLONASE Place 2 sprays into both nostrils at bedtime. For allergies   folic acid 1 MG tablet Commonly known as:  FOLVITE Take 1 tablet by mouth daily.   furosemide 80 MG tablet Commonly known as:  LASIX Take 160 mg by mouth 2 (two) times daily. Take 2 tabs = 160 mg PO BID   hydrOXYzine 25 MG tablet Commonly known as:  ATARAX/VISTARIL Take 25 mg by mouth 2 (two) times daily as needed for itching.   levothyroxine 50 MCG tablet Commonly known as:  SYNTHROID, LEVOTHROID Take 50 mcg by mouth daily before breakfast.   loratadine 10 MG tablet Commonly known as:  CLARITIN Take 10 mg by mouth daily.   Melatonin 5 MG Tabs Take 5 mg by mouth at bedtime.   metoprolol tartrate 25 MG tablet Commonly known as:  LOPRESSOR Take 12.5 mg by mouth 2 (two) times daily.   MIRALAX PO Take 17 g by mouth 2 (two) times daily as needed.   MYRBETRIQ 50 MG Tb24 tablet Generic drug:  mirabegron ER Take 50 mg by mouth daily.   potassium chloride SA 20 MEQ tablet Commonly known as:  K-DUR,KLOR-CON Take 40 mEq by mouth 2 (two) times daily.   PROCEL 100 PO Take 1 scoop by mouth 2 (two) times daily.   saccharomyces boulardii 250 MG capsule Commonly known as:  FLORASTOR Take 250 mg by mouth 2 (two) times daily.   senna-docusate 8.6-50 MG tablet Commonly known as:  Senokot-S Take 1 tablet by mouth at bedtime as needed for mild constipation.   warfarin 2.5 MG tablet Commonly known as:  COUMADIN Take 2.5 mg by mouth daily.        Physical Exam: BP 139/76   Pulse 68    Temp 98.6 F (37 C) (Oral)   Resp 20   Ht 5\' 5"  (1.651 m)   Wt 170 lb 6.4 oz (77.3 kg)   SpO2 98%   BMI 28.36 kg/m   General- elderly female, in no acute distress Head- normocephalic, atraumatic Nose- no nasal discharge Throat- moist mucus membrane Eyes- PERRLA, EOMI, no pallor, no icterus Neck- no cervical lymphadenopathy Cardiovascular- normal s1,s2, no murmurs, trace leg edema Respiratory- bilateral clear to auscultation, no wheeze, no rhonchi, no crackles Abdomen- bowel sounds present, soft, non tender Musculoskeletal- able to move all 4 extremities, generalized weakness Neurological- alert and oriented to person and place Skin- warm and dry, chronic stasis skin changes to her legs with  erythema and has some open area with dried blood, no signs of infection.     Labs reviewed: Basic Metabolic Panel:  Recent Labs  16/01/9607/23/16 1542 12/17/14 0514 12/18/14 0615  04/16/15 1334 04/17/15 0547 04/18/15 0521  10/23/15 11/10/15 11/13/15  NA  --  139 141  < > 141 143 143  < > 144 142 143  K 3.3* 3.7 3.3*  < > 3.8 3.1* 3.7  < > 4.0 3.8 3.9  CL  --  106 105  --  102 105 106  --   --   --   --   CO2  --  23 27  --   --  28 25  --   --   --   --   GLUCOSE  --  100* 110*  --  101* 93 97  --   --   --   --   BUN  --  20 16  < > 28* 20 15  < > 36* 30* 30*  CREATININE  --  1.71* 1.59*  < > 1.80* 1.60* 1.55*  < > 1.6* 1.4* 1.4*  CALCIUM  --  8.3* 8.5*  --   --  8.6* 9.0  --   --   --   --   MG 2.2 2.1  --   --   --   --  2.3  --   --   --   --   < > = values in this interval not displayed. Liver Function Tests:  Recent Labs  12/17/14 0514 12/18/14 0615  04/17/15 0547 08/19/15 11/13/15  AST 22 22  < > 17 16 18   ALT 16 16  < > 9* 9 11  ALKPHOS 74 71  < > 63 103 87  BILITOT 0.8 1.0  --  0.9  --   --   PROT 6.5 6.7  --  7.3  --   --   ALBUMIN 2.3* 2.3*  --  2.8*  --   --   < > = values in this interval not displayed. No results for input(s): LIPASE, AMYLASE in the last 8760  hours.  Recent Labs  12/17/14 0524  AMMONIA 23   CBC:  Recent Labs  04/20/15 0620 04/21/15 0643 04/22/15 0538  06/12/15 06/30/15 09/14/15  WBC 4.9 4.2 4.8  < > 4.5 3.7 3.3  NEUTROABS  --   --   --   < > 2 2 2   HGB 8.9* 8.2* 8.6*  < > 8.0* 9.1* 9.4*  HCT 29.5* 27.6* 29.4*  < > 28* 31* 31*  MCV 91.9 91.7 91.6  --   --   --   --   PLT 218 199 222  < > 230 259 231  < > = values in this interval not displayed. Cardiac Enzymes:  Recent Labs  12/14/14 2126 12/15/14 0239 12/15/14 0814  TROPONINI 0.06* 0.04* 0.06*   BNP: Invalid input(s): POCBNP CBG:  Recent Labs  04/20/15 1155  GLUCAP 127*    Radiological Exams: Dg Chest Port 1 View  12/14/2014   CLINICAL DATA:  Nausea and vomiting for 2 days  EXAM: PORTABLE CHEST - 1 VIEW  COMPARISON:  12/17/2013  FINDINGS: Cardiac shadow is mildly enlarged but stable. Multiple leads are again identified over the cardiac shadow. The lungs are well aerated. Mild vascular congestion is noted. No focal confluent infiltrate is seen.  IMPRESSION: Mild vascular congestion   Electronically Signed   By: Alcide CleverMark  Lukens  M.D.   On: 12/14/2014 17:15    Assessment/Plan  Chronic stasis to legs Has venous stasis. No fluid drainage from skin. Has some bleed with her picking on her skin, denies pruritus. 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 She has history of depression  OAB Stable, continue myrbetriq 50 mg daily  Chronic depression Continue duloxetine 20 mg daily and monitor  CHF Has AICD in place. Last EF 30-35%. Euvolemic on exam. Decrease her lasix to 140 mg bid for now and monitor. Check bmp. Continue lopressor 12.5 mg bid  ckd stage 3 Monitor bmp, decreased dose of lasix as above  Hypothyroidism Lab Results  Component Value Date   TSH 1.55 07/01/2015   Check tsh and continue levothyroxine 50 mcg daily  Hypokalemia Continue kcl 40 meq bid, check bmp in 1  week    Labs/tests ordered: bmp and tsh in 1 week  Family/ staff Communication: reviewed care plan with patient and nursing supervisor    [name], MD  [facility] [phone] (Monday-Friday 8 am - 5 pm) [phone] (afterhours)

## 2015-12-17 LAB — BASIC METABOLIC PANEL
BUN: 31 mg/dL — AB (ref 4–21)
Creatinine: 1.5 mg/dL — AB (ref 0.5–1.1)
GLUCOSE: 89 mg/dL
Potassium: 3.6 mmol/L (ref 3.4–5.3)
SODIUM: 146 mmol/L (ref 137–147)

## 2015-12-17 LAB — TSH: TSH: 0.94 u[IU]/mL (ref 0.41–5.90)

## 2016-01-06 ENCOUNTER — Encounter: Payer: Self-pay | Admitting: Adult Health

## 2016-01-06 ENCOUNTER — Non-Acute Institutional Stay (SKILLED_NURSING_FACILITY): Payer: 59 | Admitting: Adult Health

## 2016-01-06 DIAGNOSIS — J309 Allergic rhinitis, unspecified: Secondary | ICD-10-CM

## 2016-01-06 DIAGNOSIS — E46 Unspecified protein-calorie malnutrition: Secondary | ICD-10-CM

## 2016-01-06 DIAGNOSIS — N3289 Other specified disorders of bladder: Secondary | ICD-10-CM | POA: Diagnosis not present

## 2016-01-06 DIAGNOSIS — L299 Pruritus, unspecified: Secondary | ICD-10-CM | POA: Diagnosis not present

## 2016-01-06 DIAGNOSIS — G3183 Dementia with Lewy bodies: Secondary | ICD-10-CM

## 2016-01-06 DIAGNOSIS — I5022 Chronic systolic (congestive) heart failure: Secondary | ICD-10-CM | POA: Diagnosis not present

## 2016-01-06 DIAGNOSIS — E876 Hypokalemia: Secondary | ICD-10-CM

## 2016-01-06 DIAGNOSIS — K5900 Constipation, unspecified: Secondary | ICD-10-CM

## 2016-01-06 DIAGNOSIS — G4700 Insomnia, unspecified: Secondary | ICD-10-CM | POA: Diagnosis not present

## 2016-01-06 DIAGNOSIS — F0280 Dementia in other diseases classified elsewhere without behavioral disturbance: Secondary | ICD-10-CM

## 2016-01-06 DIAGNOSIS — E039 Hypothyroidism, unspecified: Secondary | ICD-10-CM | POA: Diagnosis not present

## 2016-01-06 DIAGNOSIS — F329 Major depressive disorder, single episode, unspecified: Secondary | ICD-10-CM | POA: Diagnosis not present

## 2016-01-06 DIAGNOSIS — I482 Chronic atrial fibrillation, unspecified: Secondary | ICD-10-CM

## 2016-01-06 DIAGNOSIS — N183 Chronic kidney disease, stage 3 unspecified: Secondary | ICD-10-CM

## 2016-01-06 NOTE — Progress Notes (Signed)
Patient ID: UCHECHUKWU LYTTON, female   DOB: 04/30/1940, 75 y.o.   MRN: [id number]    DATE:     01/06/16  MRN:  [id number]  BIRTHDAY: 1940-07-08  Facility:  Nursing Home Location:  [facility]  Nursing Home Room Number: 803-A  LEVEL OF CARE:  SNF [phone])  Contact Information    Name Relation Home Work Helena Daughter [phone]  [phone]   Chrissey, Ringor) Spouse [phone]  [phone]   Stephanieann, Gilland   [phone]       Chief Complaint  Patient presents with  . Medical Management of Chronic Issues    HISTORY OF PRESENT ILLNESS:  This is a 75 year old female who is being seen for a routine visit. She is a long-term resident at [facility]. Latest tsh is within normal - 0.943. CHF has been stable, no SOB.  PAST MEDICAL HISTORY:  Past Medical History:  Diagnosis Date  . Achalasia    s/p dilation  . Acute blood loss anemia   . Allergic rhinitis   . Anoxic brain injury (HCC) 1996   s/p cardiac arrest; "in a coma for 16 days"  . Atrial fibrillation (HCC)    amiodarone  . Automatic implantable cardioverter-defibrillator in situ   . Cardiac arrest - ventricular fibrillation    in 1990s  . Cellulitis of right foot   . Chronic kidney disease (CKD), stage IV (severe) (HCC)    [name] 10/03/2013  . Constipation   . Diuretic-induced hypokalemia   . HLD (hyperlipidemia)   . HTN (hypertension)   . Hypothyroidism   . Insomnia   . Lewy body dementia without behavioral disturbance   . Long term (current) use of anticoagulants   . Major depression, chronic (HCC)   . NICM (nonischemic cardiomyopathy) (HCC)    EF 35%; cath 2/12 no CAD  . Obesity   . Overactive bladder   . Physical deconditioning   . Protein calorie malnutrition (HCC)   . Rectal bleeding   . Recurrent UTI   . Severe mitral regurgitation    s/p complex mitral valve repair with cox maze + LAA clipping, removal of RV lead, and implantation of CRT-D in abdomen  .  Systolic CHF, chronic (HCC)   . TIA (transient ischemic attack)   . Vaginal bleeding      CURRENT MEDICATIONS: Reviewed  Patient's Medications  New Prescriptions   No medications on file  Previous Medications   ACETAMINOPHEN (TYLENOL) 500 MG TABLET    Take 500 mg by mouth at bedtime.   DULOXETINE (CYMBALTA) 20 MG CAPSULE    Take 20 mg by mouth daily. For depression   FLUTICASONE (FLONASE) 50 MCG/ACT NASAL SPRAY    Place 2 sprays into both nostrils at bedtime. For allergies   FOLIC ACID (FOLVITE) 1 MG TABLET    Take 1 tablet by mouth daily.   FUROSEMIDE (LASIX) 20 MG TABLET    Take 60 mg by mouth 2 (two) times daily. Take 3 tablets to = 60 mg together with an 80 mg tablet to = a total of 140 mg BID   FUROSEMIDE (LASIX) 80 MG TABLET    Take 80 mg by mouth 2 (two) times daily.    HYDROXYZINE (ATARAX/VISTARIL) 25 MG TABLET    Take 25 mg by mouth 2 (two) times daily as needed for itching.   LEVOTHYROXINE (SYNTHROID, LEVOTHROID) 50 MCG TABLET    Take 50 mcg by mouth daily before breakfast.   LORATADINE (CLARITIN) 10  MG TABLET    Take 10 mg by mouth daily.   MELATONIN 5 MG TABS    Take 5 mg by mouth at bedtime.    METOPROLOL TARTRATE (LOPRESSOR) 25 MG TABLET    Take 12.5 mg by mouth 2 (two) times daily.    MIRABEGRON ER (MYRBETRIQ) 50 MG TB24 TABLET    Take 50 mg by mouth daily.   MULTIPLE VITAMINS-MINERALS (DECUBI-VITE PO)    Take 1 capsule by mouth daily. For wound healing   POLYETHYLENE GLYCOL 3350 (MIRALAX PO)    Take 17 g by mouth 2 (two) times daily as needed.   POTASSIUM CHLORIDE SA (K-DUR,KLOR-CON) 20 MEQ TABLET    Take 40 mEq by mouth 2 (two) times daily.   PROTEIN (PROCEL 100 PO)    Take 1 scoop by mouth 2 (two) times daily.    SACCHAROMYCES BOULARDII (FLORASTOR) 250 MG CAPSULE    Take 250 mg by mouth 2 (two) times daily.   SENNA-DOCUSATE (SENOKOT-S) 8.6-50 MG PER TABLET    Take 1 tablet by mouth at bedtime as needed for mild constipation.   WARFARIN (COUMADIN) 2.5 MG TABLET    Take  2.5 mg by mouth daily.  Modified Medications   No medications on file  Discontinued Medications   No medications on file     No Known Allergies   REVIEW OF SYSTEMS:  GENERAL: no change in appetite, no fatigue, no weight changes, no fever, chills or weakness EYES: Denies change in vision, dry eyes, eye pain, itching or discharge EARS: Denies change in hearing, ringing in ears, or earache NOSE: Denies nasal congestion or epistaxis MOUTH and THROAT: Denies oral discomfort, gingival pain or bleeding, pain from teeth or hoarseness   RESPIRATORY: no cough, SOB, DOE, wheezing, hemoptysis CARDIAC: no chest pain, or palpitations GI: no abdominal pain, diarrhea, constipation, heart burn, nausea or vomiting GU: Denies dysuria, frequency, hematuria, incontinence, or discharge PSYCHIATRIC: Denies feeling of depression or anxiety. No report of hallucinations, insomnia, paranoia, or agitation   PHYSICAL EXAMINATION  GENERAL APPEARANCE: Well nourished. In no acute distress. Obese SKIN:  Bilateral lower legs has scabs and scratch marks HEAD: Normal in size and contour. No evidence of trauma EYES: Lids open and close normally. No blepharitis, entropion or ectropion. PERRL. Conjunctivae are clear and sclerae are white. Lenses are without opacity EARS: Pinnae are normal. Patient hears normal voice tunes of the examiner MOUTH and THROAT: Lips are without lesions. Oral mucosa is moist and without lesions. Tongue is normal in shape, size, and color and without lesions NECK: supple, trachea midline, no neck masses, no thyroid tenderness, no thyromegaly LYMPHATICS: no LAN in the neck, no supraclavicular LAN RESPIRATORY: breathing is even & unlabored, BS CTAB CARDIAC: no murmur,no extra heart sounds GI: abdomen soft, normal BS, no masses, no tenderness, no hepatomegaly, no splenomegaly GU:  Has FC EXTREMITIES:  Able to move 4 extremities; generalized weakness of BLE; BLE edema trace PSYCHIATRIC: Alert  and oriented X 3.  Affect and behavior are appropriate  LABS/RADIOLOGY: Labs reviewed: Basic Metabolic Panel:  Recent Labs  29/56/21 1334 04/17/15 0547 04/18/15 0521  11/10/15 11/13/15 12/17/15  NA 141 143 143  < > 142 143 146  K 3.8 3.1* 3.7  < > 3.8 3.9 3.6  CL 102 105 106  --   --   --   --   CO2  --  28 25  --   --   --   --   GLUCOSE 101* 93  97  --   --   --   --   BUN 28* 20 15  < > 30* 30* 31*  CREATININE 1.80* 1.60* 1.55*  < > 1.4* 1.4* 1.5*  CALCIUM  --  8.6* 9.0  --   --   --   --   MG  --   --  2.3  --   --   --   --   < > = values in this interval not displayed. Liver Function Tests:  Recent Labs  04/17/15 0547 08/19/15 11/13/15  AST 17 16 18   ALT 9* 9 11  ALKPHOS 63 103 87  BILITOT 0.9  --   --   PROT 7.3  --   --   ALBUMIN 2.8*  --   --     Lab Results  Component Value Date   WBC 3.3 09/14/2015   HGB 9.4 (A) 09/14/2015   HCT 31 (A) 09/14/2015   MCV 91.6 04/22/2015   PLT 231 09/14/2015    CBG:  Recent Labs  04/20/15 1155  GLUCAP 127*     ASSESSMENT/PLAN:  Insomnia - continue Melatonin 5 mg Q HS  Chronic systolic heart failure with AICD - no SOB; continue metoprolol 25 mg 1/2 tab = 12.5 mg twice a day, Lasix 80 mg take 2 tabs = 160 tab + 20 mg take 3 tabs = 60mg  = 140 mg twice a day; continue fluid restriction of 1500 ml/day  Chronic atrial fibrillation - rate controlled; continue metoprolol 25 mg take 1/2 tab = 12.5 mg twice a day and Coumadin   Hypothyoidism - continue Levothyroxine 50 mcg 1 trab PO daily  Protein calorie malnutrition -   Continue Procel  1 scoop PO  twice a day  Anemia, acute blood loss -   stable Lab Results  Component Value Date   HGB 9.4 (A) 09/14/2015    Allergic rhinitis - stable; continue Flonase 50 g/ACT 2 sprays into both nostrils daily and loratadine 10 mg daily  Pruritus - continue Atarax 25 mg 1 tab PO BID PRN  Hypokalemia - continue KCl 20 MEQ take 2 tabs = 40 MEQ by mouth twice a day Lab Results   Component Value Date   K 3.6 12/17/2015    Dementia - stable; fall precaution  Depression - mood is stable; continue Cymbalta 20 mg daily  Constipation - continue MiraLAX 17 g twice a day when necessary and senna S1 tab by mouth daily at bedtime when necessary  CKD stage 3 -  stable Lab Results  Component Value Date   CREATININE 1.5 (A) 12/17/2015   Bladder spasm - continue Myrbetriq ER 50 mg 1 tab PO daily     Goals of care:  Long-term care    Kenard GowerMonina Medina-Vargas, NP [facility] [phone]

## 2016-01-26 ENCOUNTER — Non-Acute Institutional Stay (SKILLED_NURSING_FACILITY): Payer: 59 | Admitting: Adult Health

## 2016-01-26 ENCOUNTER — Encounter: Payer: Self-pay | Admitting: Adult Health

## 2016-01-26 DIAGNOSIS — I482 Chronic atrial fibrillation, unspecified: Secondary | ICD-10-CM

## 2016-01-26 DIAGNOSIS — Z7901 Long term (current) use of anticoagulants: Secondary | ICD-10-CM | POA: Diagnosis not present

## 2016-01-26 NOTE — Progress Notes (Signed)
Patient ID: Margaret [name], female   DOB: 1941-04-18, 75 y.o.   MRN: [id number] Subjective:     Indication: atrial fibrillation Bleeding signs/symptoms: None Thromboembolic signs/symptoms: None  Missed Coumadin doses: None Medication changes: no Dietary changes: no Bacterial/viral infection: no Other concerns: no  The following portions of the patient's history were reviewed and updated as appropriate: allergies, current medications, past family history, past medical history, past social history, past surgical history and problem list.  Review of Systems A comprehensive review of systems was negative.   Objective:    INR Today: 1.4 Current dose: Coumadin 2.5 mg    Assessment:    Subtherapeutic INR for goal of 2-3   Plan:    1. New dose: Coumadin 3 mg PO Q Tuesday and Thursday, Coumadin 2.5 mg 1 tab PO Q Sunday, Monday, Wednesday and Friday   2. Next INR: 01/29/16

## 2016-01-29 ENCOUNTER — Encounter: Payer: Self-pay | Admitting: Adult Health

## 2016-01-29 ENCOUNTER — Non-Acute Institutional Stay (SKILLED_NURSING_FACILITY): Payer: 59 | Admitting: Adult Health

## 2016-01-29 DIAGNOSIS — I482 Chronic atrial fibrillation, unspecified: Secondary | ICD-10-CM

## 2016-01-29 DIAGNOSIS — J019 Acute sinusitis, unspecified: Secondary | ICD-10-CM | POA: Diagnosis not present

## 2016-01-29 DIAGNOSIS — R05 Cough: Secondary | ICD-10-CM

## 2016-01-29 DIAGNOSIS — B9689 Other specified bacterial agents as the cause of diseases classified elsewhere: Secondary | ICD-10-CM

## 2016-01-29 DIAGNOSIS — R059 Cough, unspecified: Secondary | ICD-10-CM

## 2016-01-29 DIAGNOSIS — Z7901 Long term (current) use of anticoagulants: Secondary | ICD-10-CM

## 2016-01-29 NOTE — Progress Notes (Signed)
Patient ID: Margaret [name], female   DOB: 01/10/1941, 75 y.o.   MRN: [id number]    DATE:     01/29/16  MRN:  [id number]  BIRTHDAY: 12/26/1940  Facility:  Nursing Home Location[facility]  Nursing Home Room Number: 803-A  LEVEL OF CARE:  SNF [phone])  Contact Information    Name Relation Home Work [place]   Hawks,Vicky Daughter [phone]  [phone]   Luanna SalkBrown,Horace (Frank) Spouse [phone]  [phone]   Champ MungoBrown,Chris Son   [phone]       Chief Complaint  Patient presents with  . Acute Visit    Rhinosinusitis and cough    HISTORY OF PRESENT ILLNESS:  This is a 75 year old female who has been coughing for several days. She had temperature of 99.2 last night. She has productive cough with yellowish greenish phlegm. CXR was negative for infiltrates. Noted to have facial redness and complained of tenderness on nasal and orbital area.  Latest INR 1.7; currently on Coumadin for Afib.   PAST MEDICAL HISTORY:  Past Medical History:  Diagnosis Date  . Achalasia    s/p dilation  . Acute blood loss anemia   . Allergic rhinitis   . Anoxic brain injury (HCC) 1996   s/p cardiac arrest; "in a coma for 16 days"  . Atrial fibrillation (HCC)    amiodarone  . Automatic implantable cardioverter-defibrillator in situ   . Cardiac arrest - ventricular fibrillation    in 1990s  . Cellulitis of right foot   . Chronic kidney disease (CKD), stage IV (severe) (HCC)    [name]/notes 10/03/2013  . Constipation   . Diuretic-induced hypokalemia   . HLD (hyperlipidemia)   . HTN (hypertension)   . Hypothyroidism   . Insomnia   . Lewy body dementia without behavioral disturbance   . Long term (current) use of anticoagulants   . Major depression, chronic   . NICM (nonischemic cardiomyopathy) (HCC)    EF 35%; cath 2/12 no CAD  . Obesity   . Overactive bladder   . Physical deconditioning   . Protein calorie malnutrition (HCC)   . Rectal bleeding   . Recurrent UTI   . Severe  mitral regurgitation    s/p complex mitral valve repair with cox maze + LAA clipping, removal of RV lead, and implantation of CRT-D in abdomen  . Systolic CHF, chronic (HCC)   . TIA (transient ischemic attack)   . Vaginal bleeding      CURRENT MEDICATIONS: Reviewed  Patient's Medications  New Prescriptions   No medications on file  Previous Medications   ACETAMINOPHEN (TYLENOL) 500 MG TABLET    Take 500 mg by mouth at bedtime.   DOXYCYCLINE (VIBRA-TABS) 100 MG TABLET    Take 100 mg by mouth 2 (two) times daily. Take for 7 days   FLUTICASONE (FLONASE) 50 MCG/ACT NASAL SPRAY    Place 2 sprays into both nostrils at bedtime. For allergies   FOLIC ACID (FOLVITE) 1 MG TABLET    Take 1 tablet by mouth daily.   FUROSEMIDE (LASIX) 20 MG TABLET    Take 60 mg by mouth 2 (two) times daily. Take 3 tablets to = 60 mg together with an 80 mg tablet to = a total of 140 mg BID   FUROSEMIDE (LASIX) 80 MG TABLET    Take 80 mg by mouth 2 (two) times daily.    GUAIFENESIN-DEXTROMETHORPHAN (ROBITUSSIN DM) 100-10 MG/5ML SYRUP    Take 10 mLs by mouth every 4 (  four) hours as needed for cough.   HYDROXYZINE (ATARAX/VISTARIL) 25 MG TABLET    Take 25 mg by mouth 2 (two) times daily as needed for itching.   LEVOTHYROXINE (SYNTHROID, LEVOTHROID) 50 MCG TABLET    Take 50 mcg by mouth daily before breakfast.   LORATADINE (CLARITIN) 10 MG TABLET    Take 10 mg by mouth daily.   MELATONIN 5 MG TABS    Take 5 mg by mouth at bedtime.    METOPROLOL TARTRATE (LOPRESSOR) 25 MG TABLET    Take 12.5 mg by mouth 2 (two) times daily.    MIRABEGRON ER (MYRBETRIQ) 50 MG TB24 TABLET    Take 50 mg by mouth daily.   MULTIPLE VITAMINS-MINERALS (DECUBI-VITE PO)    Take 1 capsule by mouth daily. For wound healing   POLYETHYLENE GLYCOL 3350 (MIRALAX PO)    Take 17 g by mouth 2 (two) times daily as needed.   POTASSIUM CHLORIDE SA (K-DUR,KLOR-CON) 20 MEQ TABLET    Take 40 mEq by mouth 2 (two) times daily.   PROTEIN (PROCEL 100 PO)    Take 1  scoop by mouth 2 (two) times daily.    SACCHAROMYCES BOULARDII (FLORASTOR) 250 MG CAPSULE    Take 250 mg by mouth 2 (two) times daily. Take for 10 days and then discontinue.   SENNA-DOCUSATE (SENOKOT-S) 8.6-50 MG PER TABLET    Take 1 tablet by mouth at bedtime as needed for mild constipation.   WARFARIN (COUMADIN) 2.5 MG TABLET    Take 2.5 mg by mouth. Take Sun-Mon-Fri-Sat   WARFARIN (COUMADIN) 3 MG TABLET    Take 3 mg by mouth. Take Tues-Wed-Thur  Modified Medications   No medications on file  Discontinued Medications   No medications on file     No Known Allergies   REVIEW OF SYSTEMS:  GENERAL: no change in appetite, no fatigue, no weight changes, no chills or weakness,+fever EYES: Denies change in vision, dry eyes, eye pain, itching or discharge EARS: Denies change in hearing, ringing in ears, or earache NOSE: + nasal congestion and tenderness MOUTH and THROAT: Denies oral discomfort, gingival pain or bleeding, pain from teeth or hoarseness   RESPIRATORY: no SOB, DOE, wheezing, hemoptysis,+cough CARDIAC: no chest pain, or palpitations GI: no abdominal pain, diarrhea, constipation, heart burn, nausea or vomiting GU: Denies dysuria, frequency, hematuria, incontinence, or discharge PSYCHIATRIC: Denies feeling of depression or anxiety. No report of hallucinations, insomnia, paranoia, or agitation   PHYSICAL EXAMINATION  GENERAL APPEARANCE: Well nourished. In no acute distress. Obese SKIN:  Bilateral lower legs has scabs and scratch marks HEAD: Normal in size and contour. No evidence of trauma EYES: Lids open and close normally. No blepharitis, entropion or ectropion. PERRL. Conjunctivae are clear and sclerae are white. Lenses are without opacity EARS: Pinnae are normal. Patient hears normal voice tunes of the examiner MOUTH and THROAT: Lips are without lesions. Oral mucosa is moist and without lesions. Tongue is normal in shape, size, and color and without lesions NECK: supple,  trachea midline, no neck masses, no thyroid tenderness, no thyromegaly LYMPHATICS: no LAN in the neck, no supraclavicular LAN RESPIRATORY: breathing is even & unlabored, BS CTAB CARDIAC: no murmur,no extra heart sounds GI: abdomen soft, normal BS, no masses, no tenderness, no hepatomegaly, no splenomegaly GU:  Has FC EXTREMITIES:  Able to move 4 extremities; generalized weakness of BLE; BLE edema trace PSYCHIATRIC: Alert and oriented X 3.  Affect and behavior are appropriate  LABS/RADIOLOGY: Labs reviewed: Basic Metabolic Panel:  Recent Labs  04/16/15 1334 04/17/15 0547 04/18/15 0521  11/10/15 11/13/15 12/17/15  NA 141 143 143  < > 142 143 146  K 3.8 3.1* 3.7  < > 3.8 3.9 3.6  CL 102 105 106  --   --   --   --   CO2  --  28 25  --   --   --   --   GLUCOSE 101* 93 97  --   --   --   --   BUN 28* 20 15  < > 30* 30* 31*  CREATININE 1.80* 1.60* 1.55*  < > 1.4* 1.4* 1.5*  CALCIUM  --  8.6* 9.0  --   --   --   --   MG  --   --  2.3  --   --   --   --   < > = values in this interval not displayed. Liver Function Tests:  Recent Labs  04/17/15 0547 08/19/15 11/13/15  AST 17 16 18   ALT 9* 9 11  ALKPHOS 63 103 87  BILITOT 0.9  --   --   PROT 7.3  --   --   ALBUMIN 2.8*  --   --     Lab Results  Component Value Date   WBC 3.3 09/14/2015   HGB 9.4 (A) 09/14/2015   HCT 31 (A) 09/14/2015   MCV 91.6 04/22/2015   PLT 231 09/14/2015    CBG:  Recent Labs  04/20/15 1155  GLUCAP 127*     ASSESSMENT/PLAN:  Acute bacterial rhinosinusitis - start Doxycycline 100 mg 1 PO BID X 7 days and continue Florastor 250 mg 1 capsule PO BID X 10 days then discontinue  Cough - continue Robitussin 10 ml PO Q 4 hours PRN  Chronic atrial fibrillation - rate controlled; continue metoprolol 25 mg take 1/2 tab = 12.5 mg twice a day and Coumadin   Long-term use of anticoagulant - INR 1.7, subtherapeutic (previous INR 1.4) ; Coumadin 3 mg 1 tab PO Q Tuesday, Thursday and Saturday, Coumadin 2.5  mg 1 tab PO Q Sun-M-W-F; INR on 01/29/16     Kenard Gower, NP [employer] [phone]

## 2016-02-01 LAB — CBC AND DIFFERENTIAL
HEMATOCRIT: 36 % (ref 36–46)
HEMOGLOBIN: 10.6 g/dL — AB (ref 12.0–16.0)
NEUTROS ABS: 2 /uL
PLATELETS: 225 10*3/uL (ref 150–399)
WBC: 4.4 10*3/mL

## 2016-02-01 LAB — BASIC METABOLIC PANEL
BUN: 36 mg/dL — AB (ref 4–21)
Creatinine: 1.5 mg/dL — AB (ref 0.5–1.1)
GLUCOSE: 83 mg/dL
Potassium: 3.6 mmol/L (ref 3.4–5.3)
Sodium: 145 mmol/L (ref 137–147)

## 2016-02-03 ENCOUNTER — Encounter: Payer: Self-pay | Admitting: Cardiology

## 2016-02-03 NOTE — Progress Notes (Signed)
Margaret [name]  Date of visit:  02/02/2016 DOB:  [birth year]    Age:  75 yrs. Medical record number:  [id number]     Account number:  [id number] Primary Care Provider: Merri Brunette ____________________________ CURRENT DIAGNOSES  1. Chronic systolic (congestive) heart failure  2. Chronic kidney disease, stage 3 (moderate)  3. Long term (current) use of anticoagulants  4. Nonrheumatic mitral (valve) insufficiency  5. Presence of prosthetic heart valve  6. Presence of automatic (implantable) cardiac defibrillator  7. Chronic atrial fibrillation  8. Cardiomyopathy, unspecified  9. Personal history of sudden cardiac arrest  10. Hypothyroidism, unspecified  11. Hyperlipidemia, unspecified  12. Anoxic brain damage, not elsewhere classified  13. Essential (primary) hypertension ____________________________ ALLERGIES  No Known Drug Allergies ____________________________ MEDICATIONS  1. folic acid 1 mg Tablet, 1 p.o. daily  2. fluticasone 50 mcg/actuation nasal spray,suspension, qd  3. furosemide 80 mg tablet, 2 bid  4. levothyroxine 50 mcg tablet, 1 p.o. daily  5. Myrbetriq 50 mg tablet,extended release, 1 p.o. daily  6. metoprolol tartrate 25 mg tablet, 1/2 tab b.i.d.  7. warfarin 2.5 mg tablet, Take as directed  8. warfarin 3 mg tablet, Take as directed  9. Florastor 250 mg capsule, BID  10. Vibramycin 100 mg capsule, BID  11. melatonin 5 mg tablet, QOD  12. hydroxyzine HCl 25 mg tablet, PRN  13. Klor-Con 10 mEq tablet,extended release, 2 BID ____________________________ HISTORY OF PRESENT ILLNESS Patient seen for cardiac followup. She has had a recently good 6 months since she was here but her husband has been diagnosed with cancer and is in the process of dying. Her son is with her today and her daughter who normally comes with her is not involved with taking care of her husband. The patient scratches her legs and is basically wheelchair bound. She has some excoriations of her lower  legs. She has not had any defibrillator discharges. She has an indwelling catheter. She is really not limited with dyspnea and is living and existence at the nursing home but is still quite pleasant mentally. She has no angina. She has no PND, orthopnea, syncope, or claudication. We have been trying to get her to transmit her defibrillator but despite writing letters, leaving messages this has not been done. Her defibrillator was interrogated today and she reached elective replacement 2 weeks after her last visit here. She is now at end of service on her defibrillator today. ____________________________ PAST HISTORY  Past Medical Illnesses:  hypertension, hyperlipidemia, obesity, prior cardiac arrest with anoxic brain damage, TIA november 2013, achalsia, MRSA;  Cardiovascular Illnesses:  cardiomyopathy(idiopathic), sudden death, mitral regurgitation, atrial fibrillation;  Surgical Procedures:  AICD implant, cholecystectomy (lap), AICD generator replacement April 2003, repositioning of defibrillator, explant of generator for erosion 2006;  NYHA Classification:  II;  Canadian Angina Classification:  Class 0: Asymptomatic;  Cardiology Procedures-Invasive:  cardiac cath (left) August 2011, TEE Cardioversion, cardiac cath (right and left) February 2012, MV repair, maze procedure, removal of defib wires and placement of defibrillator 07/15/10 Dr. Cornelius Moras;  Cardiology Procedures-Noninvasive:  echocardiogram November 2011, TEE December 2012, echocardiogram May 2012, echocardiogram December 2013;  Cardiac Cath Results:  normal coronary arteries;  LVEF of 35% documented via echocardiogram on 03/27/2013,  CHADS Score:  4,  CHA2DS2-VASC Score:  5 ____________________________ CARDIO-PULMONARY TEST DATES EKG Date:  02/02/2016;   Cardiac Cath Date:  06/14/2010;  Holter/Event Monitor Date: 02/03/2009;  Echocardiography Date: 03/25/2012;  Chest Xray Date: 04/01/2011;   ____________________________ FAMILY HISTORY Brother --  Brother alive with problem, Diabetes mellitus Father -- Father dead, Cancer Mother -- CVA, Mother dead ____________________________ SOCIAL HISTORY Alcohol Use:  does not use alcohol;  Smoking:  does not smoke;  Diet:  regular diet;  Lifestyle:  married;  Exercise:  exercise is limited due to physical disability;  Occupation:  disabled;  Residence:  lives with husband;   ____________________________ REVIEW OF SYSTEMS General:  malaise and fatigue  Integumentary:excoriations of ower leg Eyes: wears eye glasses/contact lenses, cataract extraction bilaterally Ears, Nose, Throat, Mouth:  denies any hearing loss, epistaxis, hoarseness or difficulty speaking. Respiratory: denies dyspnea, cough, wheezing or hemoptysis. Cardiovascular:  please review HPI Abdominal: denies dyspepsia, GI bleeding, constipation, or diarrheaGenitourinary-Female: indwelling catheter Musculoskeletal:  arthritis of the left knee Neurological:  memory loss, weakness  ____________________________ PHYSICAL EXAMINATION VITAL SIGNS  Blood Pressure:  110/60 Sitting, Left arm, regular cuff   Pulse:  68/min. Weight:  164.00 lbs. Height:  66"BMI: 26  Constitutional:  pleasant white female, in no acute distress, moderately obese in wheelchair Skin:  scattered ecchymosis present Head:  normocephalic, normal hair pattern, no masses or tenderness ENT:  ears, nose and throat reveal no gross abnormalities.  Dentition good. Neck:  neck supple without masses. No JVD, thyromegaly or bruits Chest:  healed median sternotomy scar, normal symmetry, diminished breath sounds both bases Cardiac:  regular rhythm, normal S1 and S2, No S3 or S4, no murmurs, gallops or rubs detected. Abdomen:  AICD present in LUQ Peripheral Pulses:  the femoral,dorsalis pedis, and posterior tibial pulses are full and equal bilaterally with no bruits auscultated. Genitalia Female:  indwelling catherter Extremities & Back:  mild bilateral venous insufficiency changes  present, 1+ edema, lower legs excoriated and somehwat erythematous Neurological:  unable to walk ____________________________ MOST RECENT LIPID PANEL 03/25/12  CHOL TOTL 135 mg/dl, LDL 54 calc, HDL 60 mg/dl, TRIGLYCER 161104 mg/dl and CHOL/HDL 2.3 (Calc) ____________________________ IMPRESSIONS/PLAN 1. Chronic systolic heart failure currently compensated 2. Paroxysmal atrial fibrillation currently in sinus rhythm 3. Bifascicular block 4. Idiopathic cardiomyopathy 5. Anoxic encephalopathy  Recommendations:  She is clinically compensated in terms of her heart failure and has not been in the hospital. She is in sinus rhythm with bifascicular block on EKG. She has previously had some atrial fibrillation in the past but is in sinus today.  The bigger question is what to do about her defibrillator. Long discussion with patient and her son. Her husband is dying of cancer and she is now living in a nursing home for multiple months because family could not care for her. She is mentally alert but still with some changes of short-term memory loss from her previous cardiac arrest many years ago. She is at end of service and I did discuss with her that we could make the decision not to replace the battery at all unless there was a desire for continued defibrillation in the setting of cardiac arrest and that was her wish. She and her son would like to discuss this and she also would like to discuss this with Dr. Graciela HusbandsKlein. I will make arrangements for followup there and I will see her in 3 months. She is already a limited code.  Greater than 40 minutes spent including greater than 50% of time spent face-to-face with patient and son. ____________________________ TODAYS ORDERS  1. Return Visit: 3 months  2. 12 Lead EKG: Today                       ____________________________  Cardiology Physician:  [name] MD [facility]

## 2016-02-04 ENCOUNTER — Non-Acute Institutional Stay (SKILLED_NURSING_FACILITY): Payer: 59 | Admitting: Adult Health

## 2016-02-04 ENCOUNTER — Encounter: Payer: Self-pay | Admitting: Adult Health

## 2016-02-04 DIAGNOSIS — E441 Mild protein-calorie malnutrition: Secondary | ICD-10-CM

## 2016-02-04 DIAGNOSIS — E876 Hypokalemia: Secondary | ICD-10-CM

## 2016-02-04 DIAGNOSIS — N183 Chronic kidney disease, stage 3 unspecified: Secondary | ICD-10-CM

## 2016-02-04 DIAGNOSIS — I482 Chronic atrial fibrillation, unspecified: Secondary | ICD-10-CM

## 2016-02-04 DIAGNOSIS — D638 Anemia in other chronic diseases classified elsewhere: Secondary | ICD-10-CM

## 2016-02-04 DIAGNOSIS — N3289 Other specified disorders of bladder: Secondary | ICD-10-CM | POA: Diagnosis not present

## 2016-02-04 DIAGNOSIS — J019 Acute sinusitis, unspecified: Secondary | ICD-10-CM | POA: Diagnosis not present

## 2016-02-04 DIAGNOSIS — R05 Cough: Secondary | ICD-10-CM | POA: Diagnosis not present

## 2016-02-04 DIAGNOSIS — G3183 Dementia with Lewy bodies: Secondary | ICD-10-CM | POA: Diagnosis not present

## 2016-02-04 DIAGNOSIS — J309 Allergic rhinitis, unspecified: Secondary | ICD-10-CM

## 2016-02-04 DIAGNOSIS — I5022 Chronic systolic (congestive) heart failure: Secondary | ICD-10-CM

## 2016-02-04 DIAGNOSIS — B9689 Other specified bacterial agents as the cause of diseases classified elsewhere: Secondary | ICD-10-CM

## 2016-02-04 DIAGNOSIS — L299 Pruritus, unspecified: Secondary | ICD-10-CM | POA: Diagnosis not present

## 2016-02-04 DIAGNOSIS — F329 Major depressive disorder, single episode, unspecified: Secondary | ICD-10-CM

## 2016-02-04 DIAGNOSIS — K5900 Constipation, unspecified: Secondary | ICD-10-CM

## 2016-02-04 DIAGNOSIS — R059 Cough, unspecified: Secondary | ICD-10-CM

## 2016-02-04 DIAGNOSIS — G4700 Insomnia, unspecified: Secondary | ICD-10-CM | POA: Diagnosis not present

## 2016-02-04 DIAGNOSIS — F0280 Dementia in other diseases classified elsewhere without behavioral disturbance: Secondary | ICD-10-CM

## 2016-02-04 DIAGNOSIS — E038 Other specified hypothyroidism: Secondary | ICD-10-CM

## 2016-02-04 NOTE — Progress Notes (Signed)
Patient ID: Margaret SineCarolyn S [name], female   DOB: 01/28/1941, 75 y.o.   MRN: [id number]    DATE:     02/04/16  MRN:  [id number]  BIRTHDAY: 02/07/1941  Facility:  Nursing Home [facility]  Nursing Home Room Number: 803-A  LEVEL OF CARE:  SNF [phone]  Contact Information    Name Relation Home Work [place]   [name],Margaret Daughter [phone]  [phone]   Luanna SalkBrown,Margaret (Frank) Spouse [phone]  [phone]   Champ MungoBrown,Margaret Son   [phone]       Chief Complaint  Patient presents with  . Medical Management of Chronic Issues    HISTORY OF PRESENT ILLNESS:  This is a 75 year old female who is being seen for a routine visit. [facility]. She is currently treated for bacterial rhinosinusitis with  Doxycycline. Cymbalta was recently discontinued. Her mood is stable.   PAST MEDICAL HISTORY:  Past Medical History:  Diagnosis Date  . Achalasia    s/p dilation  . Acute blood loss anemia   . Allergic rhinitis   . Anoxic brain injury (HCC) 1996   s/p cardiac arrest; "in a coma for 16 days"  . Atrial fibrillation (HCC)    amiodarone  . Automatic implantable cardioverter-defibrillator in situ   . Cardiac arrest - ventricular fibrillation    in 1990s  . Cellulitis of right foot   . Chronic kidney disease (CKD), stage IV (severe) (HCC)    [name]/notes 10/03/2013  . Constipation   . Diuretic-induced hypokalemia   . HLD (hyperlipidemia)   . HTN (hypertension)   . Hypothyroidism   . Insomnia   . Lewy body dementia without behavioral disturbance   . Long term (current) use of anticoagulants   . Major depression, chronic   . NICM (nonischemic cardiomyopathy) (HCC)    EF 35%; cath 2/12 no CAD  . Obesity   . Overactive bladder   . Physical deconditioning   . Protein calorie malnutrition (HCC)   . Rectal bleeding   . Recurrent UTI   . Severe mitral regurgitation    s/p complex mitral valve repair with cox maze + LAA clipping, removal  of RV lead, and implantation of CRT-D in abdomen  . Systolic CHF, chronic (HCC)   . TIA (transient ischemic attack)   . Vaginal bleeding      CURRENT MEDICATIONS: Reviewed  Patient's Medications  New Prescriptions   No medications on file  Previous Medications   ACETAMINOPHEN (TYLENOL) 500 MG TABLET    Take 500 mg by mouth at bedtime.   DOXYCYCLINE (VIBRA-TABS) 100 MG TABLET    Take 100 mg by mouth 2 (two) times daily. Take for 7 days   FLUTICASONE (FLONASE) 50 MCG/ACT NASAL SPRAY    Place 2 sprays into both nostrils at bedtime. For allergies   FOLIC ACID (FOLVITE) 1 MG TABLET    Take 1 tablet by mouth daily.   FUROSEMIDE (LASIX) 20 MG TABLET    Take 60 mg by mouth 2 (two) times daily. Take 3 tablets to = 60 mg together with an 80 mg tablet to = a total of 140 mg BID   FUROSEMIDE (LASIX) 80 MG TABLET    Take 80 mg by mouth 2 (two) times daily.    GUAIFENESIN-DEXTROMETHORPHAN (ROBITUSSIN DM) 100-10 MG/5ML SYRUP    Take 10 mLs by mouth every 4 (four) hours as needed for cough.   HYDROXYZINE (ATARAX/VISTARIL) 25 MG TABLET    Take 25 mg  by mouth 2 (two) times daily as needed for itching.   LEVOTHYROXINE (SYNTHROID, LEVOTHROID) 50 MCG TABLET    Take 50 mcg by mouth daily before breakfast.   LORATADINE (CLARITIN) 10 MG TABLET    Take 10 mg by mouth daily.   MELATONIN 5 MG TABS    Take 5 mg by mouth at bedtime.    METOPROLOL TARTRATE (LOPRESSOR) 25 MG TABLET    Take 12.5 mg by mouth 2 (two) times daily.    MIRABEGRON ER (MYRBETRIQ) 50 MG TB24 TABLET    Take 50 mg by mouth daily.   MULTIPLE VITAMINS-MINERALS (DECUBI-VITE PO)    Take 1 capsule by mouth daily. For wound healing   POLYETHYLENE GLYCOL 3350 (MIRALAX PO)    Take 17 g by mouth 2 (two) times daily as needed.   POTASSIUM CHLORIDE SA (K-DUR,KLOR-CON) 20 MEQ TABLET    Take 40 mEq by mouth 2 (two) times daily.   PROTEIN (PROCEL 100 PO)    Take 1 scoop by mouth 2 (two) times daily.    SACCHAROMYCES BOULARDII (FLORASTOR) 250 MG CAPSULE     Take 250 mg by mouth 2 (two) times daily. Take for 10 days and then discontinue.   SENNA-DOCUSATE (SENOKOT-S) 8.6-50 MG PER TABLET    Take 1 tablet by mouth at bedtime as needed for mild constipation.   WARFARIN (COUMADIN) 2.5 MG TABLET    Take 2.5 mg by mouth. Take Sun-Mon-Fri-Sat   WARFARIN (COUMADIN) 3 MG TABLET    Take 3 mg by mouth. Take Tues-Wed-Thur  Modified Medications   No medications on file  Discontinued Medications   No medications on file     No Known Allergies   REVIEW OF SYSTEMS:  GENERAL: no change in appetite, no fatigue, no weight changes, no fever, chills or weakness EYES: Denies change in vision, dry eyes, eye pain, itching or discharge EARS: Denies change in hearing, ringing in ears, or earache NOSE: Denies nasal congestion or epistaxis MOUTH and THROAT: Denies oral discomfort, gingival pain or bleeding, pain from teeth or hoarseness   RESPIRATORY: no cough, SOB, DOE, wheezing, hemoptysis CARDIAC: no chest pain, or palpitations GI: no abdominal pain, diarrhea, constipation, heart burn, nausea or vomiting GU: Denies dysuria, frequency, hematuria, incontinence, or discharge PSYCHIATRIC: Denies feeling of depression or anxiety. No report of hallucinations, insomnia, paranoia, or agitation   PHYSICAL EXAMINATION  GENERAL APPEARANCE: Well nourished. In no acute distress. Obese SKIN:  Bilateral lower legs has scabs and scratch marks; noted slight blood tinge on bedsheets HEAD: Normal in size and contour. No evidence of trauma EYES: Lids open and close normally. No blepharitis, entropion or ectropion. PERRL. Conjunctivae are clear and sclerae are white. Lenses are without opacity EARS: Pinnae are normal. Patient hears normal voice tunes of the examiner MOUTH and THROAT: Lips are without lesions. Oral mucosa is moist and without lesions. Tongue is normal in shape, size, and color and without lesions NECK: supple, trachea midline, no neck masses, no thyroid  tenderness, no thyromegaly LYMPHATICS: no LAN in the neck, no supraclavicular LAN RESPIRATORY: breathing is even & unlabored, BS CTAB CARDIAC: no murmur,no extra heart sounds, irregularly irregular heart rate GI: abdomen soft, normal BS, no masses, no tenderness, no hepatomegaly, no splenomegaly GU:  Has FC EXTREMITIES:  Able to move 4 extremities; generalized weakness of BLE; BLE edema trace PSYCHIATRIC: Alert and oriented X 3.  Affect and behavior are appropriate  LABS/RADIOLOGY: Labs reviewed: 01/29/16  Wbc 4.4  hgb 10.6  hct 35.5  mcv 95.2  Platelet 225  Na 145  K 3.6  Glucose 83  BUN 36  Creatinine 1.52  GFR 35.33 01/15/16  Albumin 3.35 12/17/15  tsh 0.943 Basic Metabolic Panel:  Recent Labs  78/29/56 1334 04/17/15 0547 04/18/15 0521  11/10/15 11/13/15 12/17/15  NA 141 143 143  < > 142 143 146  K 3.8 3.1* 3.7  < > 3.8 3.9 3.6  CL 102 105 106  --   --   --   --   CO2  --  28 25  --   --   --   --   GLUCOSE 101* 93 97  --   --   --   --   BUN 28* 20 15  < > 30* 30* 31*  CREATININE 1.80* 1.60* 1.55*  < > 1.4* 1.4* 1.5*  CALCIUM  --  8.6* 9.0  --   --   --   --   MG  --   --  2.3  --   --   --   --   < > = values in this interval not displayed. Liver Function Tests:  Recent Labs  04/17/15 0547 08/19/15 11/13/15  AST 17 16 18   ALT 9* 9 11  ALKPHOS 63 103 87  BILITOT 0.9  --   --   PROT 7.3  --   --   ALBUMIN 2.8*  --   --     Lab Results  Component Value Date   WBC 3.3 09/14/2015   HGB 9.4 (A) 09/14/2015   HCT 31 (A) 09/14/2015   MCV 91.6 04/22/2015   PLT 231 09/14/2015    CBG:  Recent Labs  04/20/15 1155  GLUCAP 127*     ASSESSMENT/PLAN:  Acute rhinosinusitis - no nasal congestioncontinue Doxycycline 100 mg 1 capsule PO BID X 2 more days and Florastor 250 mg 1 capsule PO BID X 4 more days  Cough - no coughing noted; discontinue Robitussin  Insomnia - continue Melatonin 5 mg Q HS  Chronic systolic heart failure with AICD - no SOB; continue  metoprolol 25 mg 1/2 tab = 12.5 mg twice a day, Lasix 80 mg take 1 tab  + 20 mg take 3 tabs = 60mg  = 140 mg twice a day; continue fluid restriction of 1500 ml/day  Chronic atrial fibrillation - rate controlled; continue metoprolol 25 mg take 1/2 tab = 12.5 mg twice a day and Coumadin   Hypothyoidism - tsh  0.943; continue Levothyroxine 50 mcg 1 trab PO daily  Protein calorie malnutrition -   Continue Procel  1 scoop PO  twice a day  Anemia of chronic disease -   hgb 10.6; stable  Allergic rhinitis - stable; continue Flonase 50 g/ACT 2 sprays into both nostrils daily and loratadine 10 mg daily  Pruritus - continue Atarax 25 mg 1 tab PO BID PRN  Hypokalemia - K 3.6; continue KCl 20 MEQ take 2 tabs = 40 MEQ by mouth twice a day  Dementia - stable; fall precaution  Depression - mood is stable; Cymbalta was recently discontinued  Constipation - continue MiraLAX 17 g twice a day when necessary and senna S1 tab by mouth daily at bedtime when necessary  CKD stage 3 -  Creatinine 1.52 ; stable  Bladder spasm - continue Myrbetriq ER 50 mg 1 tab PO daily     Goals of care:  Long-term care    Kenard Gower, NP [facility] [phone]

## 2016-02-05 ENCOUNTER — Telehealth: Payer: Self-pay

## 2016-02-05 NOTE — Telephone Encounter (Signed)
-----   Message from Duke Salvia, MD sent at 02/04/2016  8:42 PM EDT ----- Regarding: RE: defib end of life plz schedule -- [facility]oko may  be w [place] [facility].   Next tues schedule looks available there, so if we close a slot or so,-- I also notice NO new pt slots in [place] on Thurs:(((((,, but then I could see her in [place] at 1 on tues ----- Message ----- From: Othella Boyer, MD Sent: 02/03/2016   1:39 PM To: Duke Salvia, MD, Marily Lente, NP Subject: defib end of life                              Brett Canales,   Please schedule visit.   My note is in the chart. Her defib is at EOL and the question is whether to do a generator change or let it be.  I have discussed this with her and her son and they will discuss with you when they see you.  Thanks,  [employer]

## 2016-02-05 NOTE — Telephone Encounter (Signed)
l mom to inform pt of appointment in the [facility] office on 10/17 at 1:15

## 2016-02-08 ENCOUNTER — Encounter: Payer: Self-pay | Admitting: Internal Medicine

## 2016-02-09 ENCOUNTER — Encounter: Payer: Medicare Other | Admitting: Internal Medicine

## 2016-02-09 NOTE — Progress Notes (Deleted)
Patient Care Team: [name], MD as PCP - General (Internal Medicine) Duke Salvia, MD (Cardiology) Othella Boyer, MD as Consulting Physician (Cardiology) Gillis Santa, NP as Nurse Practitioner (Internal Medicine)   HPI  Margaret [name] is a 75 y.o. female Seen at the request of Dr. Donnie Aho to discuss ICD generator replacement.  She has a remote history of aborted cardiac arrest status post ICD implantation. At some point in a subclavian device eroded. She had bilateral stenosis and was not reimplanted. However, in  March 2012 she was admitted to hospital with severe mitral regurgitation and was subjected to intraoperative repair at which time a CRT defibrillator system With epicardial patches was implanted by Dr. Cornelius Moras. Because of a narrow QRS, the LV leads were not attached.As she had a history of paroxysmal atrial fibrillation she also underwent a Cox-Maze procedure.  The device was placed intra-abdominally.  She is currently in a residential facility as her husband has been diagnosed with cancer and is dying. She is currently wheelchair-bound. She has an indwelling Foley catheter. Her device reached  ERI about 7 months ago. She is now at [facility]      She also had worsening of her left ventricular function that was thought to be possibly rate related. Normalization however did not occur with rate control. an echo 2014-December demonstrated EF of 35%   Dr. Livingston Diones notes reviewed  Past Medical History:  Diagnosis Date  . Achalasia    s/p dilation  . Acute blood loss anemia   . Allergic rhinitis   . Anoxic brain injury (HCC) 1996   s/p cardiac arrest; "in a coma for 16 days"  . Atrial fibrillation (HCC)    amiodarone  . Automatic implantable cardioverter-defibrillator in situ   . Cardiac arrest - ventricular fibrillation    in 1990s  . Cellulitis of right foot   . Chronic kidney disease (CKD), stage IV (severe) (HCC)    [name] 10/03/2013  . Constipation   .  Diuretic-induced hypokalemia   . HLD (hyperlipidemia)   . HTN (hypertension)   . Hypothyroidism   . Insomnia   . Lewy body dementia without behavioral disturbance   . Long term (current) use of anticoagulants   . Major depression, chronic   . NICM (nonischemic cardiomyopathy) (HCC)    EF 35%; cath 2/12 no CAD  . Obesity   . Overactive bladder   . Physical deconditioning   . Protein calorie malnutrition (HCC)   . Rectal bleeding   . Recurrent UTI   . Severe mitral regurgitation    s/p complex mitral valve repair with cox maze + LAA clipping, removal of RV lead, and implantation of CRT-D in abdomen  . Systolic CHF, chronic (HCC)   . TIA (transient ischemic attack)   . Vaginal bleeding     Past Surgical History:  Procedure Laterality Date  . Attempted implantation of an implantable cardioverter-  12/30/2004  . CARDIOVERSION  04/14/2010  . CATARACT EXTRACTION, BILATERAL    . CATARACT EXTRACTION, BILATERAL Bilateral   . 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 Cox maze procedure (complete biatrial lesion set with clipping of  07/16/2010  . CYSTOSCOPY W/ STONE MANIPULATION    . Defibrillator generator explantation and reimplantation; and  08/02/2001  . Device migration with anticipated pocket revision  10/29/2003  . FLEXIBLE SIGMOIDOSCOPY N/A 04/17/2015   Procedure: FLEXIBLE SIGMOIDOSCOPY;  Surgeon: Carman Ching, MD;  Location: [facility]  ENDOSCOPY;  Service: Endoscopy;  Laterality: N/A;  . LAPAROSCOPIC CHOLECYSTECTOMY    . MEDIAN STERNOTOMY  07/16/2010  . MITRAL VALVE REPAIR  07/16/2010  . Placement of dual chamber pacemaker and implantable cardiac  07/16/2010  . Placement of [place] pulmonary artery catheter via left femoral access.  07/16/2010  . Removal of old endocardial right ventricular defibrillator lead.  07/16/2010    Current Outpatient Prescriptions  Medication Sig Dispense Refill  . acetaminophen (TYLENOL) 500 MG tablet Take 500 mg  by mouth at bedtime.    . fluticasone (FLONASE) 50 MCG/ACT nasal spray Place 2 sprays into both nostrils at bedtime. For allergies    . folic acid (FOLVITE) 1 MG tablet Take 1 tablet by mouth daily.    . furosemide (LASIX) 20 MG tablet Take 60 mg by mouth 2 (two) times daily. Take 3 tablets to = 60 mg together with an 80 mg tablet to = a total of 140 mg BID    . furosemide (LASIX) 80 MG tablet Take 80 mg by mouth 2 (two) times daily.     Marland Kitchen. guaiFENesin-dextromethorphan (ROBITUSSIN DM) 100-10 MG/5ML syrup Take 10 mLs by mouth every 4 (four) hours as needed for cough.    . hydrOXYzine (ATARAX/VISTARIL) 25 MG tablet Take 25 mg by mouth 2 (two) times daily as needed for itching.    . levothyroxine (SYNTHROID, LEVOTHROID) 50 MCG tablet Take 50 mcg by mouth daily before breakfast.    . loratadine (CLARITIN) 10 MG tablet Take 10 mg by mouth daily.    . Melatonin 5 MG TABS Take 5 mg by mouth at bedtime.     . metoprolol tartrate (LOPRESSOR) 25 MG tablet Take 12.5 mg by mouth 2 (two) times daily.     . mirabegron ER (MYRBETRIQ) 50 MG TB24 tablet Take 50 mg by mouth daily.    . Multiple Vitamins-Minerals (DECUBI-VITE PO) Take 1 capsule by mouth daily. For wound healing    . Polyethylene Glycol 3350 (MIRALAX PO) Take 17 g by mouth 2 (two) times daily as needed.    . potassium chloride SA (K-DUR,KLOR-CON) 20 MEQ tablet Take 40 mEq by mouth 2 (two) times daily.    . Protein (PROCEL 100 PO) Take 1 scoop by mouth 2 (two) times daily.     Marland Kitchen. senna-docusate (SENOKOT-S) 8.6-50 MG per tablet Take 1 tablet by mouth at bedtime as needed for mild constipation.    Marland Kitchen. warfarin (COUMADIN) 2.5 MG tablet Take 2.5 mg by mouth. Take Sun-Mon-Fri-Sat    . warfarin (COUMADIN) 3 MG tablet Take 3 mg by mouth. Take Tues-Wed-Thur     No current facility-administered medications for this visit.     No Known Allergies    Review of Systems negative except from HPI and PMH  Physical Exam There were no vitals taken for this  visit. Well developed and well nourished in no acute distress HENT normal E scleral and icterus clear Neck Supple JVP flat; carotids brisk and full Clear to ausculation {CARD RHYTHM:10874} ***Regular rate and rhythm, no murmurs gallops or rub Soft with active bowel sounds No clubbing cyanosis {Numbers; edema:17696} Edema Alert and oriented, grossly normal motor and sensory function Skin Warm and Dry    Assessment and  Plan       Current medicines are reviewed at length with the patient today .  The patient does not*** have concerns regarding medicines.

## 2016-02-16 ENCOUNTER — Non-Acute Institutional Stay (SKILLED_NURSING_FACILITY): Payer: 59 | Admitting: Adult Health

## 2016-02-16 ENCOUNTER — Encounter: Payer: Self-pay | Admitting: Adult Health

## 2016-02-16 DIAGNOSIS — Z7901 Long term (current) use of anticoagulants: Secondary | ICD-10-CM

## 2016-02-16 DIAGNOSIS — I482 Chronic atrial fibrillation, unspecified: Secondary | ICD-10-CM

## 2016-02-16 NOTE — Progress Notes (Signed)
Patient ID: Margaret [name], female   DOB: Sep 27, 1940, 75 y.o.   MRN: [id number] Subjective:     Indication: atrial fibrillation Bleeding signs/symptoms: None Thromboembolic signs/symptoms: None  Missed Coumadin doses: This week - 1 due to supratherapeutic INR Medication changes: no Dietary changes: no Bacterial/viral infection: no Other concerns: no  The following portions of the patient's history were reviewed and updated as appropriate: allergies, current medications, past family history, past medical history, past social history, past surgical history and problem list.  Review of Systems A comprehensive review of systems was negative.   Objective:    INR Today: 2.5 Current dose: Coumadin 3 mg daily    Assessment:    Therapeutic INR for goal of 2-3   Plan:    1. New dose: Decrease Coumadin to 2.5 mg PO Q D   2. Next INR: 02/18/16

## 2016-02-22 ENCOUNTER — Encounter: Payer: Self-pay | Admitting: Internal Medicine

## 2016-02-29 ENCOUNTER — Encounter: Payer: Self-pay | Admitting: Adult Health

## 2016-02-29 ENCOUNTER — Non-Acute Institutional Stay (SKILLED_NURSING_FACILITY): Payer: 59 | Admitting: Adult Health

## 2016-02-29 DIAGNOSIS — I482 Chronic atrial fibrillation, unspecified: Secondary | ICD-10-CM

## 2016-02-29 DIAGNOSIS — I5022 Chronic systolic (congestive) heart failure: Secondary | ICD-10-CM | POA: Diagnosis not present

## 2016-02-29 DIAGNOSIS — G4700 Insomnia, unspecified: Secondary | ICD-10-CM

## 2016-02-29 DIAGNOSIS — E876 Hypokalemia: Secondary | ICD-10-CM

## 2016-02-29 DIAGNOSIS — K5900 Constipation, unspecified: Secondary | ICD-10-CM | POA: Diagnosis not present

## 2016-02-29 DIAGNOSIS — E038 Other specified hypothyroidism: Secondary | ICD-10-CM

## 2016-02-29 NOTE — Progress Notes (Signed)
Patient ID: Margaret [name], female   DOB: June 12, 1940, 75 y.o.   MRN: [id number]    DATE:     02/29/16  MRN:  [id number]  BIRTHDAY: 08-28-1940  Facility:  Nursing Home Location:  [facility]  Nursing Home Room Number: 803-A  LEVEL OF CARE:  SNF [phone])  Contact Information    Name Relation Home Work [place] Daughter [phone]  [phone]   Chrisann, Radka) Spouse [phone]  [phone]   Harveen, Lala   [phone]       Chief Complaint  Patient presents with  . Medical Management of Chronic Issues    HISTORY OF PRESENT ILLNESS:  This is a 75 year old female who is being seen for a routine visit. She is a long-term resident at [facility]. Myrbetriq was recently discontinued.   PAST MEDICAL HISTORY:  Past Medical History:  Diagnosis Date  . Achalasia    s/p dilation  . Acute blood loss anemia   . Allergic rhinitis   . Anoxic brain injury (HCC) 1996   s/p cardiac arrest; "in a coma for 16 days"  . Atrial fibrillation (HCC)    amiodarone  . Automatic implantable cardioverter-defibrillator in situ   . Cardiac arrest - ventricular fibrillation    in 1990s  . Cellulitis of right foot   . Chronic kidney disease (CKD), stage IV (severe) (HCC)    [name] 10/03/2013  . Constipation   . Diuretic-induced hypokalemia   . HLD (hyperlipidemia)   . HTN (hypertension)   . Hypothyroidism   . Insomnia   . Lewy body dementia without behavioral disturbance   . Long term (current) use of anticoagulants   . Major depression, chronic   . NICM (nonischemic cardiomyopathy) (HCC)    EF 35%; cath 2/12 no CAD  . Obesity   . Overactive bladder   . Physical deconditioning   . Protein calorie malnutrition (HCC)   . Rectal bleeding   . Recurrent UTI   . Severe mitral regurgitation    s/p complex mitral valve repair with cox maze + LAA clipping, removal of RV lead, and implantation of CRT-D in abdomen  . Systolic CHF, chronic (HCC)   . TIA  (transient ischemic attack)   . Vaginal bleeding      CURRENT MEDICATIONS: Reviewed  Patient's Medications  New Prescriptions   No medications on file  Previous Medications   ACETAMINOPHEN (TYLENOL) 500 MG TABLET    Take 500 mg by mouth at bedtime.   FLUTICASONE (FLONASE) 50 MCG/ACT NASAL SPRAY    Place 2 sprays into both nostrils at bedtime. For allergies   FOLIC ACID (FOLVITE) 1 MG TABLET    Take 1 tablet by mouth daily.   FUROSEMIDE (LASIX) 20 MG TABLET    Take 60 mg by mouth 2 (two) times daily. Take 3 tablets to = 60 mg together with an 80 mg tablet to = a total of 140 mg BID   FUROSEMIDE (LASIX) 80 MG TABLET    Take 80 mg by mouth 2 (two) times daily.    LEVOTHYROXINE (SYNTHROID, LEVOTHROID) 50 MCG TABLET    Take 50 mcg by mouth daily before breakfast.   LORATADINE (CLARITIN) 10 MG TABLET    Take 10 mg by mouth daily.   MELATONIN 5 MG TABS    Take 5 mg by mouth at bedtime.    METOPROLOL TARTRATE (LOPRESSOR) 25 MG TABLET    Take 12.5 mg by mouth 2 (two) times daily.  MULTIPLE VITAMINS-MINERALS (DECUBI-VITE PO)    Take 1 capsule by mouth daily. For wound healing   POLYETHYLENE GLYCOL 3350 (MIRALAX PO)    Take 17 g by mouth 2 (two) times daily as needed.   POTASSIUM CHLORIDE SA (K-DUR,KLOR-CON) 20 MEQ TABLET    Take 40 mEq by mouth 2 (two) times daily.   PROTEIN (PROCEL 100 PO)    Take 1 scoop by mouth 2 (two) times daily.    SENNA-DOCUSATE (SENOKOT-S) 8.6-50 MG PER TABLET    Take 1 tablet by mouth at bedtime as needed for mild constipation.   WARFARIN (COUMADIN) 4 MG TABLET    Take 4 mg by mouth daily. Take on Monday and Friday   Modified Medications   No medications on file  Discontinued Medications   HYDROXYZINE (ATARAX/VISTARIL) 25 MG TABLET    Take 25 mg by mouth 2 (two) times daily as needed for itching.   WARFARIN (COUMADIN) 3 MG TABLET    Take 3 mg by mouth. Take Tues-Wed-Thu-Sat-Sun     No Known Allergies   REVIEW OF SYSTEMS:  GENERAL: no change in appetite, no  fatigue, no weight changes, no fever, chills or weakness EYES: Denies change in vision, dry eyes, eye pain, itching or discharge EARS: Denies change in hearing, ringing in ears, or earache NOSE: Denies nasal congestion or epistaxis MOUTH and THROAT: Denies oral discomfort, gingival pain or bleeding, pain from teeth or hoarseness   RESPIRATORY: no cough, SOB, DOE, wheezing, hemoptysis CARDIAC: no chest pain, or palpitations GI: no abdominal pain, diarrhea, constipation, heart burn, nausea or vomiting GU: Denies dysuria, frequency, hematuria, incontinence, or discharge PSYCHIATRIC: Denies feeling of depression or anxiety. No report of hallucinations, insomnia, paranoia, or agitation   PHYSICAL EXAMINATION  GENERAL APPEARANCE: Well nourished. In no acute distress. Obese SKIN:  Bilateral lower legs wrapped with unna boots HEAD: Normal in size and contour. No evidence of trauma EYES: Lids open and close normally. No blepharitis, entropion or ectropion. PERRL. Conjunctivae are clear and sclerae are white. Lenses are without opacity EARS: Pinnae are normal. Patient hears normal voice tunes of the examiner MOUTH and THROAT: Lips are without lesions. Oral mucosa is moist and without lesions. Tongue is normal in shape, size, and color and without lesions NECK: supple, trachea midline, no neck masses, no thyroid tenderness, no thyromegaly LYMPHATICS: no LAN in the neck, no supraclavicular LAN RESPIRATORY: breathing is even & unlabored, BS CTAB CARDIAC: no murmur,no extra heart sounds, irregularly irregular heart rate GI: abdomen soft, normal BS, no masses, no tenderness, no hepatomegaly, no splenomegaly GU:  Has FC EXTREMITIES:  Able to move 4 extremities; generalized weakness of BLE; BLE edema trace PSYCHIATRIC: Alert and oriented X 3.  Affect and behavior are appropriate  LABS/RADIOLOGY: Labs reviewed: 01/29/16  Wbc 4.4  hgb 10.6  hct 35.5  mcv 95.2  Platelet 225  Na 145  K 3.6  Glucose 83   BUN 36  Creatinine 1.52  GFR 35.33 01/15/16  Albumin 3.35 12/17/15  tsh 0.943 Basic Metabolic Panel:  Recent Labs  16/10/96 12/17/15 02/01/16  NA 143 146 145  K 3.9 3.6 3.6  BUN 30* 31* 36*  CREATININE 1.4* 1.5* 1.5*   Liver Function Tests:  Recent Labs  08/19/15 11/13/15  AST 16 18  ALT 9 11  ALKPHOS 103 87    Lab Results  Component Value Date   WBC 4.4 02/01/2016   HGB 10.6 (A) 02/01/2016   HCT 36 02/01/2016   MCV 91.6 04/22/2015  PLT 225 02/01/2016      ASSESSMENT/PLAN:  Hypothyoidism - continue Levothyroxine 50 mcg 1 trab PO daily Lab Results  Component Value Date   TSH 0.94 12/17/2015   Insomnia - continue Melatonin 5 mg Q HS  Chronic systolic heart failure with AICD - no SOB; continue metoprolol 25 mg 1/2 tab = 12.5 mg twice a day, Lasix 80 mg take 1 tab  + 20 mg take 3 tabs = 60mg  = 140 mg twice a day; continue fluid restriction of 1500 ml/day  Chronic atrial fibrillation - rate controlled; continue metoprolol 25 mg take 1/2 tab = 12.5 mg twice a day and Coumadin   Hypokalemia - continue KCl 20 MEQ take 2 tabs = 40 MEQ by mouth twice a day Lab Results  Component Value Date   K 3.6 02/01/2016    Constipation - continue MiraLAX 17 g twice a day when necessary and senna S1 tab by mouth daily at bedtime when necessary     Goals of care:  Long-term care    Kenard GowerMonina Medina-Vargas, NP [facility] [phone]

## 2016-03-29 ENCOUNTER — Encounter: Payer: Self-pay | Admitting: Adult Health

## 2016-03-29 ENCOUNTER — Non-Acute Institutional Stay (SKILLED_NURSING_FACILITY): Payer: 59 | Admitting: Adult Health

## 2016-03-29 DIAGNOSIS — I482 Chronic atrial fibrillation, unspecified: Secondary | ICD-10-CM

## 2016-03-29 DIAGNOSIS — Z7901 Long term (current) use of anticoagulants: Secondary | ICD-10-CM | POA: Diagnosis not present

## 2016-03-29 NOTE — Progress Notes (Signed)
Subjective:     Indication: atrial fibrillation Bleeding signs/symptoms: None Thromboembolic signs/symptoms: None  Missed Coumadin doses: None Medication changes: no Dietary changes: no Bacterial/viral infection: no Other concerns: no  The following portions of the patient's history were reviewed and updated as appropriate: allergies, current medications, past family history, past medical history, past social history, past surgical history and problem list.  Review of Systems A comprehensive review of systems was negative.   Objective:    INR Today: 1.5 Current dose:  Coumadin 4 mg 1 tab by mouth every Mondays-Wednesdays-Friday and Coumadin 3 mg 1 tab by mouth every Tuesday-Thursday-Saturday-Sunday  Assessment:    Subtherapeutic INR for goal of 2-3   Plan:    1. New dose: Start Coumadin 4 mg 1 tab by mouth daily   2. Next INR: 04/05/16

## 2016-04-08 ENCOUNTER — Non-Acute Institutional Stay (SKILLED_NURSING_FACILITY): Payer: 59 | Admitting: Adult Health

## 2016-04-08 ENCOUNTER — Encounter: Payer: Self-pay | Admitting: Adult Health

## 2016-04-08 DIAGNOSIS — J309 Allergic rhinitis, unspecified: Secondary | ICD-10-CM

## 2016-04-08 DIAGNOSIS — G3183 Dementia with Lewy bodies: Secondary | ICD-10-CM

## 2016-04-08 DIAGNOSIS — F0280 Dementia in other diseases classified elsewhere without behavioral disturbance: Secondary | ICD-10-CM | POA: Diagnosis not present

## 2016-04-08 DIAGNOSIS — D638 Anemia in other chronic diseases classified elsewhere: Secondary | ICD-10-CM

## 2016-04-08 DIAGNOSIS — K5900 Constipation, unspecified: Secondary | ICD-10-CM

## 2016-04-08 DIAGNOSIS — F329 Major depressive disorder, single episode, unspecified: Secondary | ICD-10-CM

## 2016-04-08 DIAGNOSIS — N183 Chronic kidney disease, stage 3 unspecified: Secondary | ICD-10-CM

## 2016-04-08 NOTE — Progress Notes (Signed)
Patient ID: Margaret [name], female   DOB: 25-Jun-1940, 75 y.o.   MRN: [id number]    DATE:     04/08/16  MRN:  [id number]  BIRTHDAY: Dec 01, 1940  Facility:  Nursing Home Location:  [facility]  Nursing Home Room Number: 803-A  LEVEL OF CARE:  SNF [phone])  Contact Information    Name Relation Home Work [place] Daughter [phone]  [phone]   Dorean, Hiebert) Spouse [phone]  [phone]   Laquasia, Pincus   [phone]       Chief Complaint  Patient presents with  . Medical Management of Chronic Issues    HISTORY OF PRESENT ILLNESS:  This is a 75 year old female who is being seen for a routine visit. She is a long-term resident at [facility]. Atarax was recently discontinued due to non-use.   PAST MEDICAL HISTORY:  Past Medical History:  Diagnosis Date  . Achalasia    s/p dilation  . Acute blood loss anemia   . Allergic rhinitis   . Anoxic brain injury (HCC) 1996   s/p cardiac arrest; "in a coma for 16 days"  . Atrial fibrillation (HCC)    amiodarone  . Automatic implantable cardioverter-defibrillator in situ   . Cardiac arrest - ventricular fibrillation    in 1990s  . Cellulitis of right foot   . Chronic kidney disease (CKD), stage IV (severe) (HCC)    [name] 10/03/2013  . Constipation   . Diuretic-induced hypokalemia   . HLD (hyperlipidemia)   . HTN (hypertension)   . Hypothyroidism   . Insomnia   . Lewy body dementia without behavioral disturbance   . Long term (current) use of anticoagulants   . Major depression, chronic   . NICM (nonischemic cardiomyopathy) (HCC)    EF 35%; cath 2/12 no CAD  . Obesity   . Overactive bladder   . Physical deconditioning   . Protein calorie malnutrition (HCC)   . Rectal bleeding   . Recurrent UTI   . Severe mitral regurgitation    s/p complex mitral valve repair with cox maze + LAA clipping, removal of RV lead, and implantation of CRT-D in abdomen  . Systolic CHF, chronic (HCC)    . TIA (transient ischemic attack)   . Vaginal bleeding      CURRENT MEDICATIONS: Reviewed  Patient's Medications  New Prescriptions   No medications on file  Previous Medications   ACETAMINOPHEN (TYLENOL) 500 MG TABLET    Take 500 mg by mouth at bedtime.   FLUTICASONE (FLONASE) 50 MCG/ACT NASAL SPRAY    Place 2 sprays into both nostrils at bedtime. For allergies   FOLIC ACID (FOLVITE) 1 MG TABLET    Take 1 tablet by mouth daily.   FUROSEMIDE (LASIX) 20 MG TABLET    Take 60 mg by mouth 2 (two) times daily. Take 3 tablets to = 60 mg together with an 80 mg tablet to = a total of 140 mg BID   FUROSEMIDE (LASIX) 80 MG TABLET    Take 80 mg by mouth 2 (two) times daily.    LEVOTHYROXINE (SYNTHROID, LEVOTHROID) 50 MCG TABLET    Take 50 mcg by mouth daily before breakfast.   LORATADINE (CLARITIN) 10 MG TABLET    Take 10 mg by mouth daily.   MELATONIN 5 MG TABS    Take 5 mg by mouth at bedtime.    METOPROLOL TARTRATE (LOPRESSOR) 25 MG TABLET    Take 12.5 mg by mouth 2 (two) times  daily.    MULTIPLE VITAMINS-MINERALS (DECUBI-VITE PO)    Take 1 capsule by mouth daily. For wound healing   POLYETHYLENE GLYCOL 3350 (MIRALAX PO)    Take 17 g by mouth 2 (two) times daily as needed.   POTASSIUM CHLORIDE SA (K-DUR,KLOR-CON) 20 MEQ TABLET    Take 40 mEq by mouth 2 (two) times daily.   PROTEIN (PROCEL 100 PO)    Take 1 scoop by mouth 2 (two) times daily.    SENNA-DOCUSATE (SENOKOT-S) 8.6-50 MG PER TABLET    Take 1 tablet by mouth at bedtime as needed for mild constipation.   WARFARIN (COUMADIN) 4 MG TABLET    Take 4 mg by mouth daily. Take on Monday and Friday   Modified Medications   No medications on file  Discontinued Medications   HYDROXYZINE (ATARAX/VISTARIL) 25 MG TABLET    Take 25 mg by mouth 2 (two) times daily as needed for itching.   WARFARIN (COUMADIN) 3 MG TABLET    Take 3 mg by mouth. Take Tues-Wed-Thu-Sat-Sun     No Known Allergies   REVIEW OF SYSTEMS:  GENERAL: no change in  appetite, no fatigue, no weight changes, no fever, chills or weakness EYES: Denies change in vision, dry eyes, eye pain, itching or discharge EARS: Denies change in hearing, ringing in ears, or earache NOSE: Denies nasal congestion or epistaxis MOUTH and THROAT: Denies oral discomfort, gingival pain or bleeding, pain from teeth or hoarseness   RESPIRATORY: no cough, SOB, DOE, wheezing, hemoptysis CARDIAC: no chest pain, or palpitations GI: no abdominal pain, diarrhea, constipation, heart burn, nausea or vomiting GU: Denies dysuria, frequency, hematuria, incontinence, or discharge PSYCHIATRIC: Denies feeling of depression or anxiety. No report of hallucinations, insomnia, paranoia, or agitation   PHYSICAL EXAMINATION  GENERAL APPEARANCE: Well nourished. In no acute distress. Obese SKIN:  Bilateral lower legs has unna boots HEAD: Normal in size and contour. No evidence of trauma EYES: Lids open and close normally. No blepharitis, entropion or ectropion. PERRL. Conjunctivae are clear and sclerae are white. Lenses are without opacity EARS: Pinnae are normal. Patient hears normal voice tunes of the examiner MOUTH and THROAT: Lips are without lesions. Oral mucosa is moist and without lesions. Tongue is normal in shape, size, and color and without lesions NECK: supple, trachea midline, no neck masses, no thyroid tenderness, no thyromegaly LYMPHATICS: no LAN in the neck, no supraclavicular LAN RESPIRATORY: breathing is even & unlabored, BS CTAB CARDIAC: no murmur,no extra heart sounds, irregularly irregular heart rate GI: abdomen soft, normal BS, no masses, no tenderness, no hepatomegaly, no splenomegaly GU:  Has FC EXTREMITIES:  Able to move 4 extremities; generalized weakness of BLE PSYCHIATRIC: Alert and oriented X 3.  Affect and behavior are appropriate  LABS/RADIOLOGY: Labs reviewed: 01/29/16  Wbc 4.4  hgb 10.6  hct 35.5  mcv 95.2  Platelet 225  Na 145  K 3.6  Glucose 83  BUN 36   Creatinine 1.52  GFR 35.33 01/15/16  Albumin 3.35 12/17/15  tsh 0.943 Basic Metabolic Panel:  Recent Labs  54/12/8105/21/17 12/17/15 02/01/16  NA 143 146 145  K 3.9 3.6 3.6  BUN 30* 31* 36*  CREATININE 1.4* 1.5* 1.5*   Liver Function Tests:  Recent Labs  08/19/15 11/13/15  AST 16 18  ALT 9 11  ALKPHOS 103 87    Lab Results  Component Value Date   WBC 4.4 02/01/2016   HGB 10.6 (A) 02/01/2016   HCT 36 02/01/2016   MCV 91.6 04/22/2015  PLT 225 02/01/2016      ASSESSMENT/PLAN:  Anemia of chronic disease -    stable Lab Results  Component Value Date   HGB 10.6 (A) 02/01/2016   Dementia - Continue supportive care; fall precautions  CKD stage 3 - stable Lab Results  Component Value Date   CREATININE 1.5 (A) 02/01/2016    Depression - mood is stable; Cymbalta was recently discontinued  Allergic rhinitis - stable; continue Flonase 50 g/ACT 2 sprays into both nostrils daily and loratadine 10 mg daily  Constipation - continue MiraLAX 17 g twice a day when necessary and senna S1 tab by mouth daily at bedtime when necessary     Goals of care:  Long-term care    Kenard Gower, NP [facility] [phone]

## 2016-05-09 ENCOUNTER — Encounter: Payer: Self-pay | Admitting: Adult Health

## 2016-05-09 NOTE — Progress Notes (Signed)
This encounter was created in error - please disregard.

## 2016-05-23 ENCOUNTER — Non-Acute Institutional Stay (SKILLED_NURSING_FACILITY): Payer: 59 | Admitting: Adult Health

## 2016-05-23 ENCOUNTER — Encounter: Payer: Self-pay | Admitting: Adult Health

## 2016-05-23 DIAGNOSIS — N319 Neuromuscular dysfunction of bladder, unspecified: Secondary | ICD-10-CM

## 2016-05-23 DIAGNOSIS — I482 Chronic atrial fibrillation, unspecified: Secondary | ICD-10-CM

## 2016-05-23 DIAGNOSIS — E038 Other specified hypothyroidism: Secondary | ICD-10-CM | POA: Diagnosis not present

## 2016-05-23 DIAGNOSIS — Z7901 Long term (current) use of anticoagulants: Secondary | ICD-10-CM

## 2016-05-23 DIAGNOSIS — I5022 Chronic systolic (congestive) heart failure: Secondary | ICD-10-CM | POA: Diagnosis not present

## 2016-05-23 DIAGNOSIS — E876 Hypokalemia: Secondary | ICD-10-CM | POA: Diagnosis not present

## 2016-05-23 NOTE — Progress Notes (Signed)
Patient ID: Margaret [name], female   DOB: 1940-08-13, 76 y.o.   MRN: [id number]    DATE:     05/23/16  MRN:  [id number]  BIRTHDAY: 05-04-1940  Facility:  Nursing Home Location:  [facility]  Nursing Home Room Number: 803-A  LEVEL OF CARE:  SNF [phone])  Contact Information    Name Relation Home Work [place] Daughter [phone]  [phone]   Christina, Gintz) Spouse [phone]  [phone]   Verania, Salberg   [phone]       Chief Complaint  Patient presents with  . Medical Management of Chronic Issues    HISTORY OF PRESENT ILLNESS:  This is a 76 year old female who is being seen for a routine visit. She is a long-term resident at [facility]. Latest INR 2.7, therapeutic. Coumadin was held X 3 days due to supratherapeutic INR. No bruising nor bleeding has been noted. Procell was recently discontinued by RD   PAST MEDICAL HISTORY:  Past Medical History:  Diagnosis Date  . Achalasia    s/p dilation  . Acute blood loss anemia   . Allergic rhinitis   . Anoxic brain injury (HCC) 1996   s/p cardiac arrest; "in a coma for 16 days"  . Atrial fibrillation (HCC)    amiodarone  . Automatic implantable cardioverter-defibrillator in situ   . Cardiac arrest - ventricular fibrillation    in 1990s  . Cellulitis of right foot   . Chronic kidney disease (CKD), stage IV (severe) (HCC)    [name] 10/03/2013  . Constipation   . Diuretic-induced hypokalemia   . HLD (hyperlipidemia)   . HTN (hypertension)   . Hypothyroidism   . Insomnia   . Lewy body dementia without behavioral disturbance   . Long term (current) use of anticoagulants   . Major depression, chronic   . NICM (nonischemic cardiomyopathy) (HCC)    EF 35%; cath 2/12 no CAD  . Obesity   . Overactive bladder   . Physical deconditioning   . Protein calorie malnutrition (HCC)   . Rectal bleeding   . Recurrent UTI   . Severe mitral regurgitation    s/p complex mitral valve repair  with cox maze + LAA clipping, removal of RV lead, and implantation of CRT-D in abdomen  . Systolic CHF, chronic (HCC)   . TIA (transient ischemic attack)   . Vaginal bleeding      CURRENT MEDICATIONS: Reviewed  Patient's Medications  New Prescriptions   No medications on file  Previous Medications   ACETAMINOPHEN (TYLENOL) 500 MG TABLET    Take 500 mg by mouth at bedtime.   FLUTICASONE (FLONASE) 50 MCG/ACT NASAL SPRAY    Place 2 sprays into both nostrils at bedtime. For allergies   FOLIC ACID (FOLVITE) 1 MG TABLET    Take 1 tablet by mouth daily.   FUROSEMIDE (LASIX) 20 MG TABLET    Take 140 mg by mouth. Take 7 tablets to = 140 mg BID   LEVOTHYROXINE (SYNTHROID, LEVOTHROID) 50 MCG TABLET    Take 50 mcg by mouth daily before breakfast.   LORATADINE (CLARITIN) 10 MG TABLET    Take 10 mg by mouth daily.   MELATONIN 5 MG TABS    Take 5 mg by mouth at bedtime.    METOPROLOL TARTRATE (LOPRESSOR) 25 MG TABLET    Take 12.5 mg by mouth 2 (two) times daily.    MULTIPLE VITAMINS-MINERALS (DECUBI-VITE PO)    Take 1 capsule by mouth daily.  For wound healing   POLYETHYLENE GLYCOL 3350 (MIRALAX PO)    Take 17 g by mouth 2 (two) times daily as needed.   POTASSIUM CHLORIDE SA (K-DUR,KLOR-CON) 20 MEQ TABLET    Take 40 mEq by mouth 2 (two) times daily.   SENNA-DOCUSATE (SENOKOT-S) 8.6-50 MG PER TABLET    Take 1 tablet by mouth at bedtime as needed for mild constipation.   WARFARIN (COUMADIN) 2 MG TABLET    Take 2 mg by mouth every evening.  Modified Medications   No medications on file  Discontinued Medications   FUROSEMIDE (LASIX) 20 MG TABLET    Take 60 mg by mouth 2 (two) times daily. Take 3 tablets to = 60 mg together with an 80 mg tablet to = a total of 140 mg BID   FUROSEMIDE (LASIX) 80 MG TABLET    Take 80 mg by mouth 2 (two) times daily.    PROTEIN (PROCEL 100 PO)    Take 1 scoop by mouth 2 (two) times daily.    WARFARIN (COUMADIN) 4 MG TABLET    Take 4 mg by mouth daily. Take on Monday and  Friday      No Known Allergies   REVIEW OF SYSTEMS:  GENERAL: no change in appetite, no fatigue, no weight changes, no fever, chills or weakness EYES: Denies change in vision, dry eyes, eye pain, itching or discharge EARS: Denies change in hearing, ringing in ears, or earache NOSE: Denies nasal congestion or epistaxis MOUTH and THROAT: Denies oral discomfort, gingival pain or bleeding, pain from teeth or hoarseness   RESPIRATORY: no cough, SOB, DOE, wheezing, hemoptysis CARDIAC: no chest pain, or palpitations GI: no abdominal pain, diarrhea, constipation, heart burn, nausea or vomiting GU: Denies dysuria, frequency, hematuria, incontinence, or discharge PSYCHIATRIC: Denies feeling of depression or anxiety. No report of hallucinations, insomnia, paranoia, or agitation   PHYSICAL EXAMINATION  GENERAL APPEARANCE: Well nourished. In no acute distress. Obese SKIN:  Bilateral lower legs has unna boots HEAD: Normal in size and contour. No evidence of trauma EYES: Lids open and close normally. No blepharitis, entropion or ectropion. PERRL. Conjunctivae are clear and sclerae are white. Lenses are without opacity EARS: Pinnae are normal. Patient hears normal voice tunes of the examiner MOUTH and THROAT: Lips are without lesions. Oral mucosa is moist and without lesions. Tongue is normal in shape, size, and color and without lesions NECK: supple, trachea midline, no neck masses, no thyroid tenderness, no thyromegaly LYMPHATICS: no LAN in the neck, no supraclavicular LAN RESPIRATORY: breathing is even & unlabored, BS CTAB CARDIAC: no murmur,no extra heart sounds, irregularly irregular heart rate GI: abdomen soft, normal BS, no masses, no tenderness, no hepatomegaly, no splenomegaly GU:  Has FC EXTREMITIES:  Able to move 4 extremities; generalized weakness of BLE PSYCHIATRIC: Alert and oriented X 3.  Affect and behavior are appropriate   LABS/RADIOLOGY: Labs reviewed: 01/15/16  Albumin  3.35 12/17/15  tsh 0.943 Basic Metabolic Panel:  Recent Labs  54/09/81 12/17/15 02/01/16  NA 143 146 145  K 3.9 3.6 3.6  BUN 30* 31* 36*  CREATININE 1.4* 1.5* 1.5*   Liver Function Tests:  Recent Labs  08/19/15 11/13/15  AST 16 18  ALT 9 11  ALKPHOS 103 87    Lab Results  Component Value Date   WBC 4.4 02/01/2016   HGB 10.6 (A) 02/01/2016   HCT 36 02/01/2016   MCV 91.6 04/22/2015   PLT 225 02/01/2016      ASSESSMENT/PLAN:  Hypothyroidism -  continue levothyroxine 50 g 1 tab by mouth daily Lab Results  Component Value Date   TSH 0.94 12/17/2015   Chronic systolic heart failure with AICD - no SOB; continue Lasix 20 mg 20 mg 7 tabs = 140 mg by mouth twice a day  Hypokalemia - continue KCl ER 20 meq 2 tabs = 40 and MICU by mouth twice a day; continue fluid restrictions of 1.5 mL/day Lab Results  Component Value Date   K 3.6 02/01/2016   Chronic atrial fibrillation - rate controlled; continue metoprolol titrate 25 mg 1/2 tab = 12.5 mg by mouth twice a day and Coumadin     Neurogenic bladder - continue Foley catheter and change monthly/when necessary  Long-term use of anticoagulant - INR 2.7, therapeutic; decrease Coumadin to 1.5 mg by mouth daily; check INR on 05/26/16     Goals of care:  Long-term care    Kenard Gower, NP [facility] [phone]

## 2016-05-25 LAB — CBC AND DIFFERENTIAL
HCT: 38 % (ref 36–46)
HEMOGLOBIN: 11.5 g/dL — AB (ref 12.0–16.0)
Neutrophils Absolute: 3 /uL
Platelets: 179 10*3/uL (ref 150–399)
WBC: 4.2 10^3/mL

## 2016-05-25 LAB — BASIC METABOLIC PANEL
BUN: 28 mg/dL — AB (ref 4–21)
CREATININE: 1.6 mg/dL — AB (ref 0.5–1.1)
Glucose: 106 mg/dL
POTASSIUM: 4.2 mmol/L (ref 3.4–5.3)
SODIUM: 144 mmol/L (ref 137–147)

## 2016-05-27 LAB — BASIC METABOLIC PANEL
BUN: 31 mg/dL — AB (ref 4–21)
Creatinine: 1.6 mg/dL — AB (ref 0.5–1.1)
GLUCOSE: 140 mg/dL
POTASSIUM: 4.6 mmol/L (ref 3.4–5.3)
SODIUM: 141 mmol/L (ref 137–147)

## 2016-05-27 LAB — CBC AND DIFFERENTIAL
HCT: 38 % (ref 36–46)
Hemoglobin: 11.7 g/dL — AB (ref 12.0–16.0)
Neutrophils Absolute: 3 /uL
Platelets: 182 10*3/uL (ref 150–399)
WBC: 5.4 10^3/mL

## 2016-06-01 LAB — BASIC METABOLIC PANEL
BUN: 25 mg/dL — AB (ref 4–21)
CREATININE: 1.6 mg/dL — AB (ref 0.5–1.1)
GLUCOSE: 140 mg/dL
POTASSIUM: 4 mmol/L (ref 3.4–5.3)
SODIUM: 143 mmol/L (ref 137–147)

## 2016-06-01 LAB — CBC AND DIFFERENTIAL
HCT: 38 % (ref 36–46)
HEMOGLOBIN: 11.6 g/dL — AB (ref 12.0–16.0)
Neutrophils Absolute: 3 /uL
Platelets: 214 10*3/uL (ref 150–399)
WBC: 5.1 10*3/mL

## 2016-06-13 IMAGING — US US TRANSVAGINAL NON-OB
1 series · 15 of 25 positions shown · non-contrast
Comparison: None

CLINICAL DATA: Postmenopausal bleeding.

EXAM:
TRANSABDOMINAL AND TRANSVAGINAL ULTRASOUND OF PELVIS
TECHNIQUE: Both transabdominal and transvaginal ultrasound examinations of the
pelvis were performed. Transabdominal technique was performed for
global imaging of the pelvis including uterus, ovaries, adnexal
regions, and pelvic cul-de-sac. It was necessary to proceed with
endovaginal exam following the transabdominal exam to visualize the
uterus, endometrium and adnexa..

[Series 1: us transvaginal non-ob · 15 of 55 slices shown]
[im 1/55]
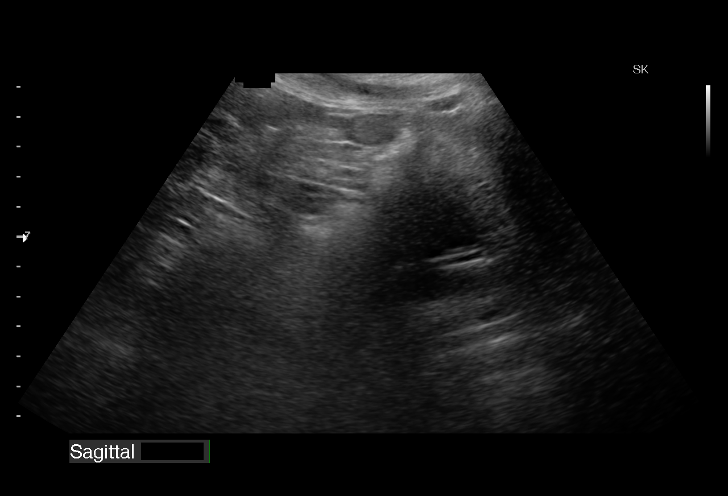
[im 5/55]
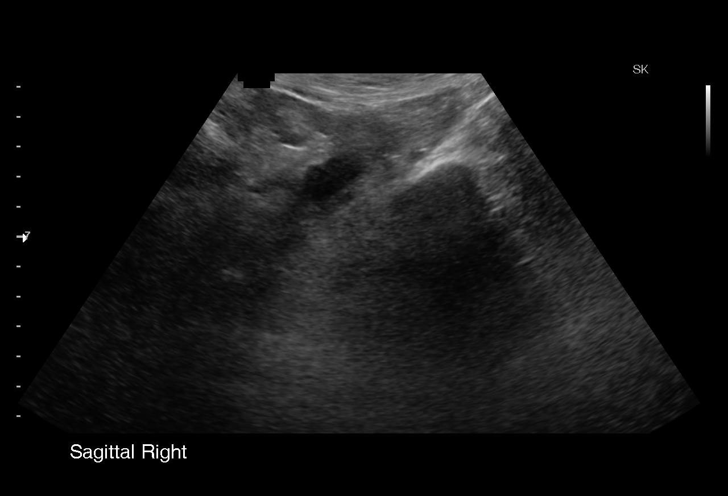
[im 10/55]
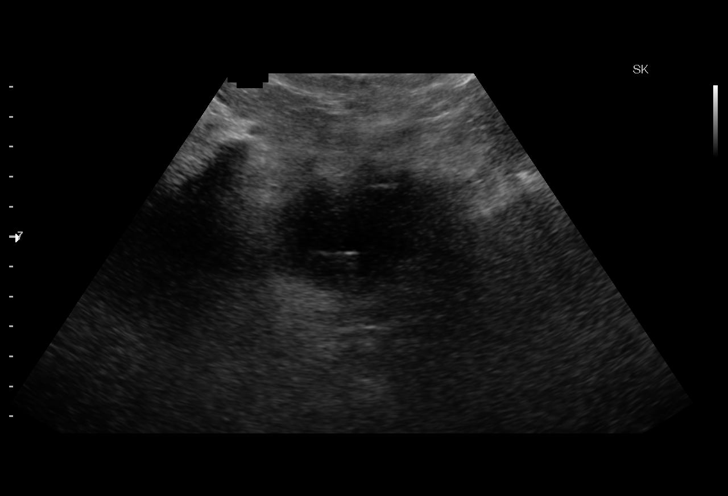
[im 12/55]
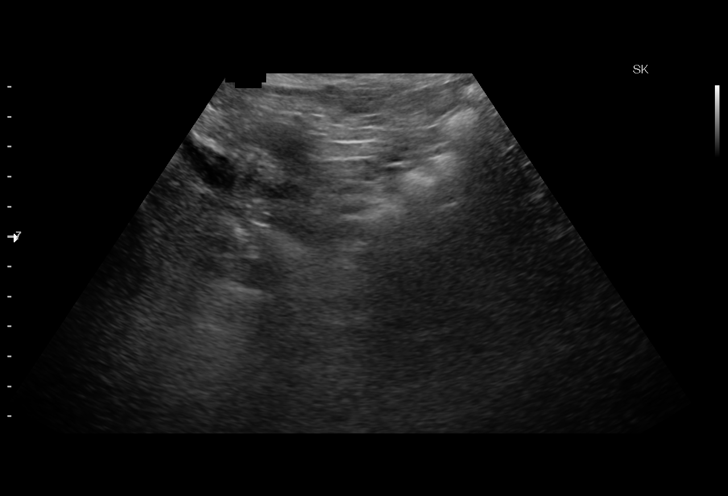
[im 16/55]
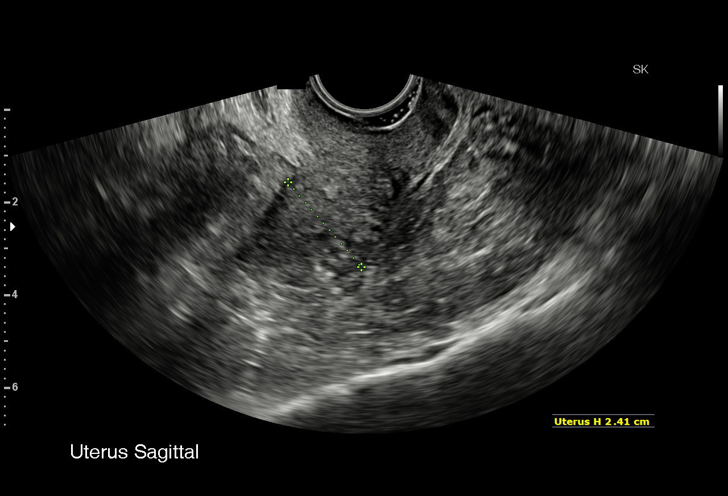
[im 21/55]
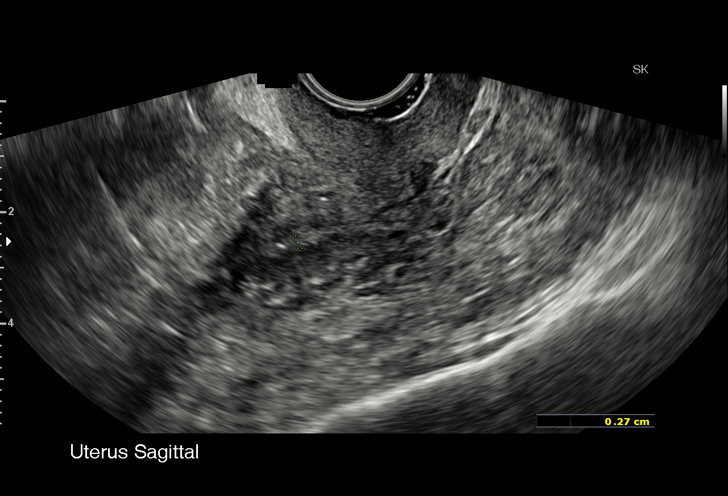
[im 23/55]
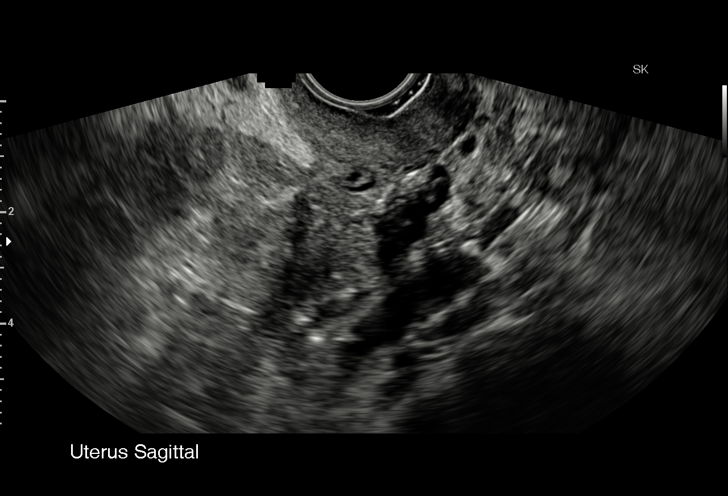
[im 28/55]
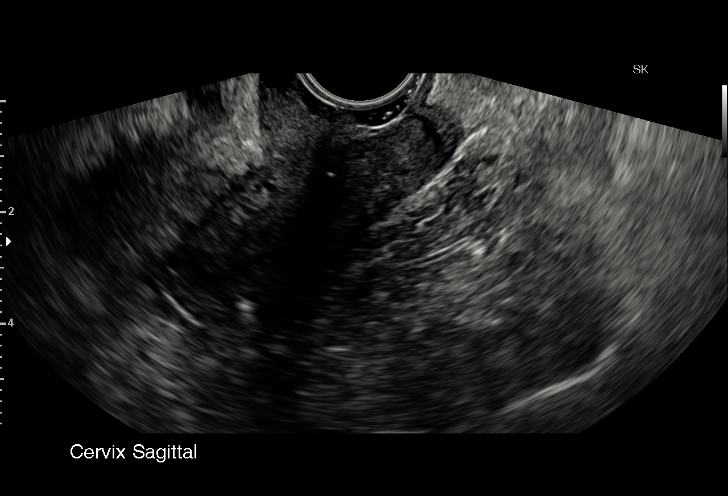
[im 32/55]
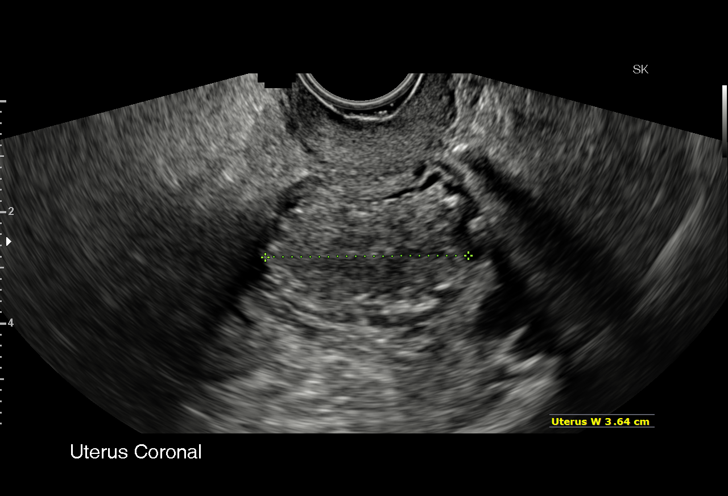
[im 34/55]
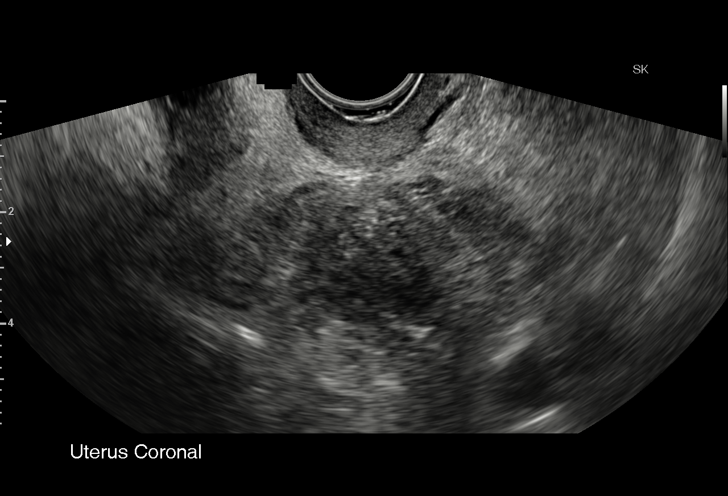
[im 39/55]
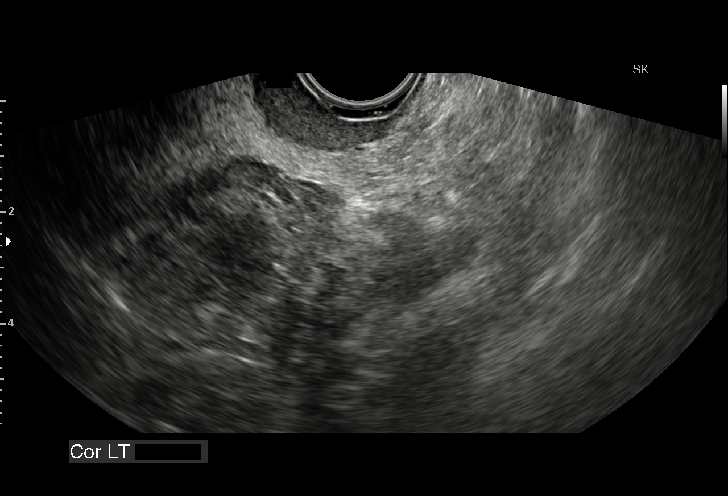
[im 43/55]
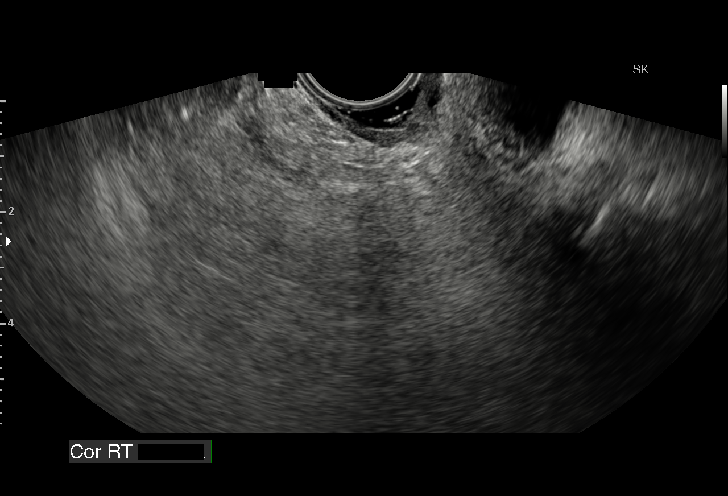
[im 46/55]
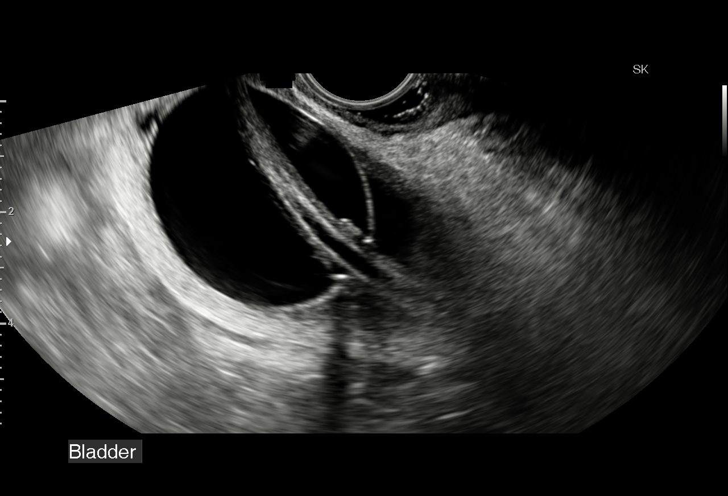
[im 50/55]
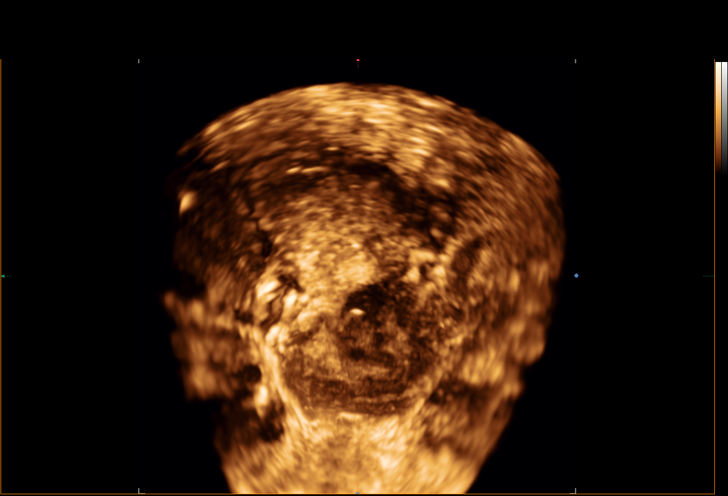
[im 55/55]
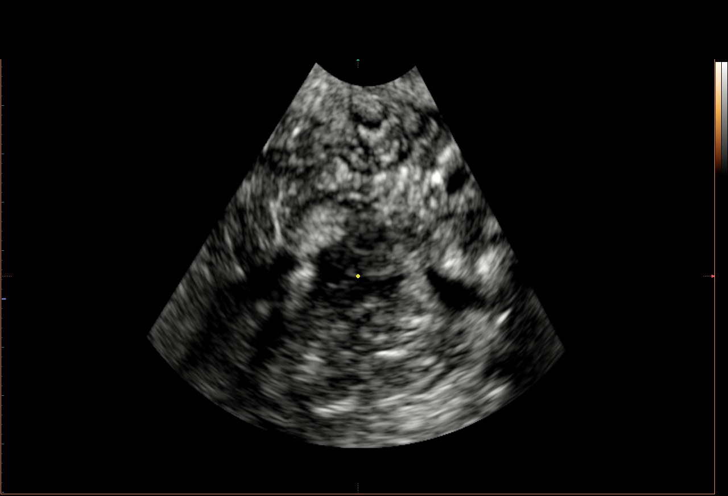

[15 of 25 positions shown; findings below may reference images not displayed]

FINDINGS: Uterus

Measurements: 5.8 x 2.4 x 3.6 cm. Calcifications noted peripherally.

Endometrium

Thickness: 2.6 mm..  No focal abnormality visualized.

Right ovary

Measurements: Not visualized.. No adnexal mass noted.

Left ovary

Measurements: Not visualize.. No adnexal mass noted.

Other findings

No abnormal free fluid.
IMPRESSION: 1. In the setting of post-menopausal bleeding, this is consistent
with a benign etiology such as endometrial atrophy. If bleeding
remains unresponsive to hormonal or medical therapy, sonohysterogram
should be considered for focal lesion work-up. (Ref: Radiological
Reasoning: Algorithmic Workup of Abnormal Vaginal Bleeding with
Endovaginal Sonography and Sonohysterography. AJR [id number]; 191:S68-73)

## 2016-06-24 ENCOUNTER — Non-Acute Institutional Stay (SKILLED_NURSING_FACILITY): Payer: 59 | Admitting: Adult Health

## 2016-06-24 ENCOUNTER — Encounter: Payer: Self-pay | Admitting: Adult Health

## 2016-06-24 DIAGNOSIS — N183 Chronic kidney disease, stage 3 unspecified: Secondary | ICD-10-CM

## 2016-06-24 DIAGNOSIS — F329 Major depressive disorder, single episode, unspecified: Secondary | ICD-10-CM | POA: Diagnosis not present

## 2016-06-24 DIAGNOSIS — G4700 Insomnia, unspecified: Secondary | ICD-10-CM

## 2016-06-24 DIAGNOSIS — K5900 Constipation, unspecified: Secondary | ICD-10-CM

## 2016-06-24 DIAGNOSIS — J309 Allergic rhinitis, unspecified: Secondary | ICD-10-CM

## 2016-06-24 NOTE — Progress Notes (Signed)
Patient ID: Margaret [name], female   DOB: 11/13/1940, 76 y.o.   MRN: [id number]    DATE:     06/24/16  MRN:  [id number]  BIRTHDAY: 05-11-1940  Facility:  Nursing Home Location:  [facility]  Nursing Home Room Number: 803-A  LEVEL OF CARE:  SNF [phone])  Contact Information    Name Relation Home Work Margaret [name] Daughter [phone]  [phone]   Margaret [name], Margaret [name]) Spouse [phone]  [phone]   Margaret [name], Margaret [name]   [phone]       Chief Complaint  Patient presents with  . Medical Management of Chronic Issues    HISTORY OF PRESENT ILLNESS:  This is a 76 year old female who is being seen for a routine visit. She is a long-term resident at [facility]. She was recently noted to be staying in her room and not getting out for the whole day. She had a psych consult and was started on Effexor for depression. She was noted today happily wheeling herself outside.  PAST MEDICAL HISTORY:  Past Medical History:  Diagnosis Date  . Achalasia    s/p dilation  . Acute blood loss anemia   . Allergic rhinitis   . Anoxic brain injury (HCC) 1996   s/p cardiac arrest; "in a coma for 16 days"  . Atrial fibrillation (HCC)    amiodarone  . Automatic implantable cardioverter-defibrillator in situ   . Cardiac arrest - ventricular fibrillation    in 1990s  . Cellulitis of right foot   . Chronic kidney disease (CKD), stage IV (severe) (HCC)    Margaret [name] 10/03/2013  . Constipation   . Diuretic-induced hypokalemia   . HLD (hyperlipidemia)   . HTN (hypertension)   . Hypothyroidism   . Insomnia   . Lewy body dementia without behavioral disturbance   . Long term (current) use of anticoagulants   . Major depression, chronic   . NICM (nonischemic cardiomyopathy) (HCC)    EF 35%; cath 2/12 no CAD  . Obesity   . Overactive bladder   . Physical deconditioning   . Protein calorie malnutrition (HCC)   . Rectal bleeding   . Recurrent UTI   . Severe mitral  regurgitation    s/p complex mitral valve repair with cox maze + LAA clipping, removal of RV lead, and implantation of CRT-D in abdomen  . Systolic CHF, chronic (HCC)   . TIA (transient ischemic attack)   . Vaginal bleeding      CURRENT MEDICATIONS: Reviewed  Patient's Medications  New Prescriptions   No medications on file  Previous Medications   ACETAMINOPHEN (TYLENOL) 500 MG TABLET    Take 500 mg by mouth at bedtime.   FLUTICASONE (FLONASE) 50 MCG/ACT NASAL SPRAY    Place 2 sprays into both nostrils at bedtime. For allergies   FOLIC ACID (FOLVITE) 1 MG TABLET    Take 1 tablet by mouth daily.   FUROSEMIDE (LASIX) 20 MG TABLET    Take 140 mg by mouth. Take 7 tablets to = 140 mg BID   LEVOTHYROXINE (SYNTHROID, LEVOTHROID) 50 MCG TABLET    Take 50 mcg by mouth daily before breakfast.   LOPERAMIDE (IMODIUM A-D) 2 MG TABLET    Take 2-4 mg by mouth as needed for diarrhea or loose stools (Give 4 mg initially, followed by 2 mg after each episode). Not to exceed 16 mg in 24 hours   LORATADINE (CLARITIN) 10 MG TABLET    Take 10 mg by mouth daily.  MELATONIN 5 MG TABS    Take 5 mg by mouth at bedtime.    METOPROLOL TARTRATE (LOPRESSOR) 25 MG TABLET    Take 12.5 mg by mouth 2 (two) times daily.    POLYETHYLENE GLYCOL 3350 (MIRALAX PO)    Take 17 g by mouth 2 (two) times daily as needed.   POTASSIUM CHLORIDE SA (K-DUR,KLOR-CON) 20 MEQ TABLET    Take 40 mEq by mouth 2 (two) times daily.   SENNA-DOCUSATE (SENOKOT-S) 8.6-50 MG PER TABLET    Take 1 tablet by mouth at bedtime as needed for mild constipation.   VENLAFAXINE (EFFEXOR) 75 MG TABLET    Take 75 mg by mouth daily.   VENLAFAXINE XR (EFFEXOR-XR) 37.5 MG 24 HR CAPSULE    Take 37.5 mg by mouth daily with breakfast. Take for 7 days   WARFARIN (COUMADIN) 3 MG TABLET    Take 3 mg by mouth daily.  Modified Medications   No medications on file  Discontinued Medications   MULTIPLE VITAMINS-MINERALS (DECUBI-VITE PO)    Take 1 capsule by mouth daily.  For wound healing   WARFARIN (COUMADIN) 2 MG TABLET    Take 2 mg by mouth every evening.     No Known Allergies   REVIEW OF SYSTEMS:  GENERAL: no change in appetite, no fatigue, no weight changes, no fever, chills or weakness EYES: Denies change in vision, dry eyes, eye pain, itching or discharge EARS: Denies change in hearing, ringing in ears, or earache NOSE: Denies nasal congestion or epistaxis MOUTH and THROAT: Denies oral discomfort, gingival pain or bleeding, pain from teeth or hoarseness   RESPIRATORY: no cough, SOB, DOE, wheezing, hemoptysis CARDIAC: no chest pain, or palpitations GI: no abdominal pain, diarrhea, constipation, heart burn, nausea or vomiting GU: Denies dysuria, frequency, hematuria, incontinence, or discharge PSYCHIATRIC: Denies feeling of depression or anxiety. No report of hallucinations, insomnia, paranoia, or agitation   PHYSICAL EXAMINATION  GENERAL APPEARANCE: Well nourished. In no acute distress. Obese SKIN:  Bilateral lower legs has unna boots HEAD: Normal in size and contour. No evidence of trauma EYES: Lids open and close normally. No blepharitis, entropion or ectropion. PERRL. Conjunctivae are clear and sclerae are white. Lenses are without opacity EARS: Pinnae are normal. Patient hears normal voice tunes of the examiner MOUTH and THROAT: Lips are without lesions. Oral mucosa is moist and without lesions. Tongue is normal in shape, size, and color and without lesions NECK: supple, trachea midline, no neck masses, no thyroid tenderness, no thyromegaly LYMPHATICS: no LAN in the neck, no supraclavicular LAN RESPIRATORY: breathing is even & unlabored, BS CTAB CARDIAC: no murmur,no extra heart sounds, irregularly irregular heart rate GI: abdomen soft, normal BS, no masses, no tenderness, no hepatomegaly, no splenomegaly GU:  Has FC draining to urine bag with yellowish urine EXTREMITIES:  Able to move 4 extremities; generalized weakness of  BLE PSYCHIATRIC: Alert and oriented X 3.  Affect and behavior are appropriate   LABS/RADIOLOGY: Labs reviewed: 01/15/16  Albumin 3.35 12/17/15  tsh 0.943 Basic Metabolic Panel:  Recent Labs  27/78/24 05/27/16 06/01/16  NA 144 141 143  K 4.2 4.6 4.0  BUN 28* 31* 25*  CREATININE 1.6* 1.6* 1.6*   Liver Function Tests:  Recent Labs  08/19/15 11/13/15  AST 16 18  ALT 9 11  ALKPHOS 103 87    Lab Results  Component Value Date   WBC 5.1 06/01/2016   HGB 11.6 (A) 06/01/2016   HCT 38 06/01/2016   MCV 91.6  04/22/2015   PLT 214 06/01/2016      ASSESSMENT/PLAN:  Chronic kidney disease, stage III - stable Lab Results  Component Value Date   CREATININE 1.6 (A) 06/01/2016   Depression - was recently started on Effexor for seclusion, she was seen today outside of her room smiling  Insomnia - continue melatonin 5 mg by mouth daily at bedtime  Allergic rhinitis - continue fluticasone 50 g 2 sprays into nostril daily at bedtime and loratadine 10 mg 1 tab by mouth daily  Constipation - continue MiraLAX 17 g by mouth twice a day when necessary and senna S 1 tab by mouth daily at bedtime when necessary     Goals of care:  Long-term care    Kenard Gower, NP [facility] [phone]

## 2016-07-26 ENCOUNTER — Emergency Department (HOSPITAL_COMMUNITY): Payer: Medicare Other

## 2016-07-26 ENCOUNTER — Encounter (HOSPITAL_COMMUNITY): Payer: Self-pay | Admitting: Emergency Medicine

## 2016-07-26 ENCOUNTER — Inpatient Hospital Stay (HOSPITAL_COMMUNITY)
Admission: EM | Admit: 2016-07-26 | Discharge: 2016-07-26 | DRG: 951 | Disposition: A | Discharge status: Hospice | Payer: Medicare Other | Attending: Family Medicine | Admitting: Family Medicine

## 2016-07-26 DIAGNOSIS — I482 Chronic atrial fibrillation, unspecified: Secondary | ICD-10-CM | POA: Diagnosis present

## 2016-07-26 DIAGNOSIS — E785 Hyperlipidemia, unspecified: Secondary | ICD-10-CM | POA: Diagnosis present

## 2016-07-26 DIAGNOSIS — E669 Obesity, unspecified: Secondary | ICD-10-CM | POA: Diagnosis present

## 2016-07-26 DIAGNOSIS — R680 Hypothermia, not associated with low environmental temperature: Secondary | ICD-10-CM | POA: Diagnosis present

## 2016-07-26 DIAGNOSIS — E162 Hypoglycemia, unspecified: Secondary | ICD-10-CM | POA: Diagnosis present

## 2016-07-26 DIAGNOSIS — F0280 Dementia in other diseases classified elsewhere without behavioral disturbance: Secondary | ICD-10-CM | POA: Diagnosis present

## 2016-07-26 DIAGNOSIS — F329 Major depressive disorder, single episode, unspecified: Secondary | ICD-10-CM | POA: Diagnosis present

## 2016-07-26 DIAGNOSIS — K7290 Hepatic failure, unspecified without coma: Secondary | ICD-10-CM | POA: Diagnosis present

## 2016-07-26 DIAGNOSIS — E872 Acidosis: Secondary | ICD-10-CM | POA: Diagnosis present

## 2016-07-26 DIAGNOSIS — G931 Anoxic brain damage, not elsewhere classified: Secondary | ICD-10-CM | POA: Diagnosis present

## 2016-07-26 DIAGNOSIS — Z8674 Personal history of sudden cardiac arrest: Secondary | ICD-10-CM

## 2016-07-26 DIAGNOSIS — I130 Hypertensive heart and chronic kidney disease with heart failure and stage 1 through stage 4 chronic kidney disease, or unspecified chronic kidney disease: Secondary | ICD-10-CM | POA: Diagnosis present

## 2016-07-26 DIAGNOSIS — I429 Cardiomyopathy, unspecified: Secondary | ICD-10-CM | POA: Diagnosis present

## 2016-07-26 DIAGNOSIS — E039 Hypothyroidism, unspecified: Secondary | ICD-10-CM | POA: Diagnosis present

## 2016-07-26 DIAGNOSIS — E875 Hyperkalemia: Secondary | ICD-10-CM | POA: Diagnosis present

## 2016-07-26 DIAGNOSIS — I878 Other specified disorders of veins: Secondary | ICD-10-CM | POA: Diagnosis present

## 2016-07-26 DIAGNOSIS — N184 Chronic kidney disease, stage 4 (severe): Secondary | ICD-10-CM | POA: Diagnosis present

## 2016-07-26 DIAGNOSIS — Z833 Family history of diabetes mellitus: Secondary | ICD-10-CM

## 2016-07-26 DIAGNOSIS — Z515 Encounter for palliative care: Secondary | ICD-10-CM | POA: Diagnosis present

## 2016-07-26 DIAGNOSIS — N183 Chronic kidney disease, stage 3 unspecified: Secondary | ICD-10-CM | POA: Diagnosis present

## 2016-07-26 DIAGNOSIS — G3183 Dementia with Lewy bodies: Secondary | ICD-10-CM | POA: Diagnosis present

## 2016-07-26 DIAGNOSIS — Z79899 Other long term (current) drug therapy: Secondary | ICD-10-CM

## 2016-07-26 DIAGNOSIS — N179 Acute kidney failure, unspecified: Secondary | ICD-10-CM

## 2016-07-26 DIAGNOSIS — R945 Abnormal results of liver function studies: Secondary | ICD-10-CM | POA: Diagnosis present

## 2016-07-26 DIAGNOSIS — R7989 Other specified abnormal findings of blood chemistry: Secondary | ICD-10-CM

## 2016-07-26 DIAGNOSIS — R197 Diarrhea, unspecified: Secondary | ICD-10-CM | POA: Diagnosis present

## 2016-07-26 DIAGNOSIS — A419 Sepsis, unspecified organism: Secondary | ICD-10-CM | POA: Diagnosis present

## 2016-07-26 DIAGNOSIS — Z66 Do not resuscitate: Secondary | ICD-10-CM | POA: Diagnosis present

## 2016-07-26 DIAGNOSIS — G9340 Encephalopathy, unspecified: Secondary | ICD-10-CM | POA: Diagnosis present

## 2016-07-26 DIAGNOSIS — I5022 Chronic systolic (congestive) heart failure: Secondary | ICD-10-CM | POA: Diagnosis present

## 2016-07-26 DIAGNOSIS — N3281 Overactive bladder: Secondary | ICD-10-CM | POA: Diagnosis present

## 2016-07-26 DIAGNOSIS — R0602 Shortness of breath: Secondary | ICD-10-CM

## 2016-07-26 DIAGNOSIS — R6520 Severe sepsis without septic shock: Secondary | ICD-10-CM

## 2016-07-26 DIAGNOSIS — Z8673 Personal history of transient ischemic attack (TIA), and cerebral infarction without residual deficits: Secondary | ICD-10-CM

## 2016-07-26 DIAGNOSIS — I10 Essential (primary) hypertension: Secondary | ICD-10-CM | POA: Diagnosis present

## 2016-07-26 DIAGNOSIS — Z7901 Long term (current) use of anticoagulants: Secondary | ICD-10-CM

## 2016-07-26 DIAGNOSIS — R9431 Abnormal electrocardiogram [ECG] [EKG]: Secondary | ICD-10-CM | POA: Diagnosis present

## 2016-07-26 DIAGNOSIS — Z95810 Presence of automatic (implantable) cardiac defibrillator: Secondary | ICD-10-CM

## 2016-07-26 LAB — I-STAT CHEM 8, ED
BUN: 37 mg/dL — AB (ref 6–20)
CALCIUM ION: 0.99 mmol/L — AB (ref 1.15–1.40)
CHLORIDE: 112 mmol/L — AB (ref 101–111)
CREATININE: 2.7 mg/dL — AB (ref 0.44–1.00)
GLUCOSE: 53 mg/dL — AB (ref 65–99)
HCT: 43 % (ref 36.0–46.0)
Hemoglobin: 14.6 g/dL (ref 12.0–15.0)
POTASSIUM: 7.9 mmol/L — AB (ref 3.5–5.1)
Sodium: 139 mmol/L (ref 135–145)
TCO2: 13 mmol/L (ref 0–100)

## 2016-07-26 LAB — CBC WITH DIFFERENTIAL/PLATELET
BASOS ABS: 0 10*3/uL (ref 0.0–0.1)
Basophils Relative: 0 %
EOS PCT: 0 %
Eosinophils Absolute: 0 10*3/uL (ref 0.0–0.7)
HEMATOCRIT: 43.4 % (ref 36.0–46.0)
Hemoglobin: 12.8 g/dL (ref 12.0–15.0)
LYMPHS ABS: 0.8 10*3/uL (ref 0.7–4.0)
LYMPHS PCT: 5 %
MCH: 29.3 pg (ref 26.0–34.0)
MCHC: 29.5 g/dL — AB (ref 30.0–36.0)
MCV: 99.3 fL (ref 78.0–100.0)
MONO ABS: 1.7 10*3/uL — AB (ref 0.1–1.0)
MONOS PCT: 12 %
NEUTROS ABS: 12.3 10*3/uL — AB (ref 1.7–7.7)
Neutrophils Relative %: 83 %
Platelets: 207 10*3/uL (ref 150–400)
RBC: 4.37 MIL/uL (ref 3.87–5.11)
RDW: 16.5 % — AB (ref 11.5–15.5)
WBC: 14.8 10*3/uL — ABNORMAL HIGH (ref 4.0–10.5)

## 2016-07-26 LAB — I-STAT TROPONIN, ED: TROPONIN I, POC: 0.04 ng/mL (ref 0.00–0.08)

## 2016-07-26 LAB — COMPREHENSIVE METABOLIC PANEL
ALT: 2235 U/L — ABNORMAL HIGH (ref 14–54)
AST: 6985 U/L — AB (ref 15–41)
Albumin: 3.4 g/dL — ABNORMAL LOW (ref 3.5–5.0)
Alkaline Phosphatase: 222 U/L — ABNORMAL HIGH (ref 38–126)
Anion gap: 22 — ABNORMAL HIGH (ref 5–15)
BILIRUBIN TOTAL: 3.8 mg/dL — AB (ref 0.3–1.2)
BUN: 31 mg/dL — AB (ref 6–20)
CO2: 11 mmol/L — ABNORMAL LOW (ref 22–32)
CREATININE: 2.74 mg/dL — AB (ref 0.44–1.00)
Calcium: 9.5 mg/dL (ref 8.9–10.3)
Chloride: 106 mmol/L (ref 101–111)
GFR, EST AFRICAN AMERICAN: 18 mL/min — AB (ref >60)
GFR, EST NON AFRICAN AMERICAN: 16 mL/min — AB (ref >60)
Glucose, Bld: 53 mg/dL — ABNORMAL LOW (ref 65–99)
Sodium: 139 mmol/L (ref 135–145)
TOTAL PROTEIN: 7.9 g/dL (ref 6.5–8.1)

## 2016-07-26 LAB — I-STAT CG4 LACTIC ACID, ED: Lactic Acid, Venous: 11.67 mmol/L (ref 0.5–1.9)

## 2016-07-26 LAB — APTT: APTT: 46 s — AB (ref 24–36)

## 2016-07-26 LAB — PROTIME-INR
INR: 10.59 — AB
Prothrombin Time: 87.2 seconds — ABNORMAL HIGH (ref 11.4–15.2)

## 2016-07-26 LAB — MAGNESIUM: Magnesium: 2.5 mg/dL — ABNORMAL HIGH (ref 1.7–2.4)

## 2016-07-26 LAB — PROCALCITONIN: Procalcitonin: 1.3 ng/mL

## 2016-07-26 LAB — LACTIC ACID, PLASMA: Lactic Acid, Venous: 10 mmol/L (ref 0.5–1.9)

## 2016-07-26 MED ORDER — SCOPOLAMINE 1 MG/3DAYS TD PT72: 1.0000 | MEDICATED_PATCH | TRANSDERMAL | Status: DC

## 2016-07-26 MED ORDER — SODIUM CHLORIDE 0.9 % IV BOLUS (SEPSIS)
500.0000 mL | Freq: Once | INTRAVENOUS | Status: AC
  Administered 2016-07-26: 500 mL via INTRAVENOUS

## 2016-07-26 MED ORDER — ALBUTEROL SULFATE (2.5 MG/3ML) 0.083% IN NEBU
10.0000 mg | INHALATION_SOLUTION | Freq: Once | RESPIRATORY_TRACT | Status: AC
  Administered 2016-07-26: 10 mg via RESPIRATORY_TRACT
  Filled 2016-07-26: qty 12

## 2016-07-26 MED ORDER — PIPERACILLIN-TAZOBACTAM 3.375 G IVPB 30 MIN
3.3750 g | Freq: Once | INTRAVENOUS | Status: AC
  Administered 2016-07-26: 3.375 g via INTRAVENOUS
  Filled 2016-07-26: qty 50

## 2016-07-26 MED ORDER — SODIUM CHLORIDE 0.9 % IV BOLUS (SEPSIS)
1000.0000 mL | Freq: Once | INTRAVENOUS | Status: AC
  Administered 2016-07-26: 1000 mL via INTRAVENOUS

## 2016-07-26 MED ORDER — CALCIUM GLUCONATE 10 % IV SOLN
1.0000 g | Freq: Once | INTRAVENOUS | Status: AC
  Administered 2016-07-26: 1 g via INTRAVENOUS
  Filled 2016-07-26: qty 10

## 2016-07-26 MED ORDER — DEXTROSE 50 % IV SOLN
1.0000 | Freq: Once | INTRAVENOUS | Status: AC
  Administered 2016-07-26: 50 mL via INTRAVENOUS
  Filled 2016-07-26: qty 50

## 2016-07-26 MED ORDER — DEXTROSE 10 % IV SOLN
Freq: Once | INTRAVENOUS | Status: AC
  Administered 2016-07-26: 05:00:00 via INTRAVENOUS

## 2016-07-26 MED ORDER — ONDANSETRON 4 MG PO TBDP: 4.0000 mg | ORAL_TABLET | Freq: Three times a day (TID) | ORAL | Status: DC | PRN

## 2016-07-26 MED ORDER — INSULIN ASPART 100 UNIT/ML IV SOLN
10.0000 [IU] | Freq: Once | INTRAVENOUS | Status: DC
Start: 2016-07-26 — End: 2016-07-26

## 2016-07-26 MED ORDER — SODIUM CHLORIDE 0.9 % IV SOLN: 1000.0000 mL | INTRAVENOUS | Status: DC

## 2016-07-26 MED ORDER — MORPHINE SULFATE (CONCENTRATE) 10 MG/0.5ML PO SOLN: 10.0000 mg | ORAL | Status: DC | PRN

## 2016-07-26 MED ORDER — VANCOMYCIN HCL IN DEXTROSE 1-5 GM/200ML-% IV SOLN
1000.0000 mg | Freq: Once | INTRAVENOUS | Status: AC
  Administered 2016-07-26: 1000 mg via INTRAVENOUS
  Filled 2016-07-26: qty 200

## 2016-07-26 MED ORDER — NAPHAZOLINE-GLYCERIN 0.012-0.2 % OP SOLN: 1.0000 [drp] | Freq: Four times a day (QID) | OPHTHALMIC | Status: DC | PRN

## 2016-07-26 MED ORDER — INSULIN ASPART 100 UNIT/ML ~~LOC~~ SOLN
10.0000 [IU] | Freq: Once | SUBCUTANEOUS | Status: AC
  Administered 2016-07-26: 10 [IU] via INTRAVENOUS
  Filled 2016-07-26: qty 1

## 2016-07-26 MED ORDER — VANCOMYCIN HCL IN DEXTROSE 1-5 GM/200ML-% IV SOLN: 1000.0000 mg | INTRAVENOUS | Status: DC

## 2016-07-26 MED ORDER — SODIUM CHLORIDE 0.9 % IV BOLUS (SEPSIS): 1000.0000 mL | Freq: Once | INTRAVENOUS | Status: DC

## 2016-07-26 MED ORDER — SODIUM BICARBONATE 8.4 % IV SOLN
50.0000 meq | Freq: Once | INTRAVENOUS | Status: AC
  Administered 2016-07-26: 50 meq via INTRAVENOUS
  Filled 2016-07-26: qty 50

## 2016-07-26 MED ORDER — LORAZEPAM 2 MG/ML PO CONC: 2.0000 mg | ORAL | Status: DC | PRN

## 2016-07-26 MED ORDER — PIPERACILLIN-TAZOBACTAM IN DEX 2-0.25 GM/50ML IV SOLN: 2.2500 g | Freq: Three times a day (TID) | INTRAVENOUS | Status: DC

## 2016-07-26 NOTE — ED Notes (Signed)
Pt's catheter bag switched out with a clean new bag to obtain a urine sample. Stage manager notified and in room when done

## 2016-07-26 NOTE — ED Notes (Signed)
Got patient a coke

## 2016-07-26 NOTE — ED Notes (Signed)
Patient transported to CT 

## 2016-07-26 NOTE — ED Notes (Signed)
Pt with large soft Negro bowel movement. Pt bedside pericare completed with foley care wipes, linens changed, gown changed.

## 2016-07-26 NOTE — Consult Note (Signed)
[facility] received request from PMT RN Threasa Beards for family interest in [facility]. Spoke with Threasa Beards by phone to acknowledge referral and receive report. Chart reviewed, received report from [place] and met with patient, spouse and daughter to confirm interest and explain services. Eligibility confirmed by [facility] physician. Registration paper work completed by daughter. Provided [employer] contact information to RN. Per RN, [employer] has been contacted to confirm ICD deactivated.   RN please call report to [phone].  Please fax discharge paper work to [phone].  Thank you,  Erling Conte, LCSW [phone]

## 2016-07-26 NOTE — ED Notes (Signed)
Delay in lab draw pt in CT 

## 2016-07-26 NOTE — Discharge Summary (Signed)
Physician Discharge Summary  Margaret [name] OAC:[id number] DOB: 09-10-1940 DOA: 07/26/2016  PCP: Blanchie Serve, MD  Admit date: 07/26/2016 Discharge date: 07/26/2016  Disposition:  [place]  Discharge Condition: Hospice CODE STATUS: DNR  Brief/Interim Summary: HPI: Margaret [name] is a 76 y.o. female with medical history significant of hypertension, hyperlipidemia, TIA, sCHF with EF 30-35%, Lewy body dementia, CKD-IV, atrial fibrillation on Coumadin, AICD, hypothyroidism, depression, who presents with hypothermia, diarrhea, low blood pressure and altered mental status.  Per family, pt is from SNF. She was found to have low body temperature with oral temp 96, lower bp 94/50 by EMS.  and diarrhea and confusion. When I saw pt in ED, she is oriented to place and knows her husband, but not oriented to time. Per report, patient was confused upon EMS picking patient up, but patient is more alert upon arrival to ED. She could answer some questions. She reports diarrhea in the past 3 days. Her daughter states that patient had more than 5 times of loose stool diarrhea today. Patient denies shortness of breath, chest pain or cough. She has a Foley cath in in place, not sure if pt has symptoms for UTI. Per family, patient does not have nausea, vomiting and abdominal pain. She seems to move all extremities normally. Palliative medicine met with family and with patient decided to pursue full comfort care and arrangements were made for patient to go directly to [facility] from the ED.   Please see palliative care and [facility] notes.    Discharge Diagnoses:  Principal Problem:   Comfort measures only status Active Problems:   Automatic implantable cardioverter-defibrillator in situ   CKD (chronic kidney disease), stage III   Hypertension   Hyperlipidemia   Long-term (current) use of anticoagulants   History of TIA (transient ischemic attack) withour residua   Prolonged Q-T interval on ECG  Chronic systolic congestive heart failure (HCC)   Chronic atrial fibrillation (HCC)   Venous stasis of lower extremity   Severe sepsis (HCC)   Acute renal failure superimposed on stage 4 chronic kidney disease (HCC)   Abnormal LFTs   Hyperkalemia   Hypoglycemia   Diarrhea   Prolongation of QRS complex on electrocardiography   Acute encephalopathy  Discharge Instructions   Allergies as of 07/26/2016   No Known Allergies     Medication List    STOP taking these medications   potassium chloride SA 20 MEQ tablet Commonly known as:  K-DUR,KLOR-CON   warfarin 3 MG tablet Commonly known as:  COUMADIN     TAKE these medications   acetaminophen 500 MG tablet Commonly known as:  TYLENOL Take 500 mg by mouth at bedtime.   fluticasone 50 MCG/ACT nasal spray Commonly known as:  FLONASE Place 2 sprays into both nostrils at bedtime. For allergies   folic acid 1 MG tablet Commonly known as:  FOLVITE Take 1 tablet by mouth daily.   furosemide 20 MG tablet Commonly known as:  LASIX Take 140 mg by mouth 2 (two) times daily. Take 7 tablets to = 140 mg BID   levothyroxine 50 MCG tablet Commonly known as:  SYNTHROID, LEVOTHROID Take 50 mcg by mouth daily before breakfast.   loperamide 2 MG tablet Commonly known as:  IMODIUM A-D Take 2-4 mg by mouth 4 (four) times daily as needed for diarrhea or loose stools (Give 4 mg initially, followed by 2 mg after each episode). Not to exceed 16 mg in 24 hours   loratadine 10  MG tablet Commonly known as:  CLARITIN Take 10 mg by mouth daily.   Melatonin 5 MG Tabs Take 5 mg by mouth at bedtime.   metoprolol tartrate 25 MG tablet Commonly known as:  LOPRESSOR Take 12.5 mg by mouth 2 (two) times daily.   MIRALAX PO Take 17 g by mouth 2 (two) times daily as needed.   senna-docusate 8.6-50 MG tablet Commonly known as:  Senokot-S Take 1 tablet by mouth at bedtime as needed for mild constipation.   venlafaxine 75 MG tablet Commonly known  as:  EFFEXOR Take 75 mg by mouth daily.       No Known Allergies  Procedures/Studies: Dg Chest 1 View  Result Date: 07/26/2016 CLINICAL DATA:  Increasing dyspnea for 2 days EXAM: CHEST 1 VIEW COMPARISON:  12/17/2014 FINDINGS: Unchanged cardiomegaly. No airspace consolidation. Normal pulmonary vasculature. No large pleural effusion. IMPRESSION: No consolidation or effusion. Electronically Signed   By: Andreas Newport M.D.   On: 07/26/2016 04:56   Ct Head Wo Contrast  Result Date: 07/26/2016 CLINICAL DATA:  76 y/o F; history of anoxic brain injury presenting with altered mental status, diarrhea, and increased respiratory rate. EXAM: CT HEAD WITHOUT CONTRAST TECHNIQUE: Contiguous axial images were obtained from the base of the skull through the vertex without intravenous contrast. COMPARISON:  12/05/2012 CT of the head. FINDINGS: Brain: No evidence of acute infarction, hemorrhage, hydrocephalus, extra-axial collection or mass lesion/mass effect. Stable extensive hypoattenuation of subcortical and periventricular white matter compatible with advanced microvascular ischemic changes. Stable moderate brain parenchymal volume loss. 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 Vascular: Extensive calcific atherosclerosis of cavernous and paraclinoid internal carotid arteries. Skull: Motion artifact of the skullbase. No displaced calvarial fracture identified. Sinuses/Orbits: No acute finding. Other: None. IMPRESSION: 1. No acute intracranial abnormality. 2. Stable advanced chronic microvascular ischemic changes and moderate parenchymal volume loss of the brain. 3. 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 Electronically Signed   By: Kristine Garbe M.D.   On: 07/26/2016 04:53   Subjective: Pt was seen and examined and had no complaints.    Discharge Exam: Vitals:   07/26/16 0845  07/26/16 0900  BP: 115/88 116/72  Pulse: 95 97  Resp:  20  Temp:     Vitals:   07/26/16 0800 07/26/16 0815 07/26/16 0845 07/26/16 0900  BP: 122/69 109/63 115/88 116/72  Pulse: 97 83 95 97  Resp:    20  Temp:      TempSrc:      SpO2: 96% 93% 98% 98%  Weight:      Height:       General: Pt is alert, awake, not in acute distress Cardiovascular: normal s1, s2 Respiratory: CTA bilaterally, no wheezing, no rhonchi Abdominal: Soft, NT, ND, bowel sounds + Extremities:  no cyanosis  The results of significant diagnostics from this hospitalization (including imaging, microbiology, ancillary and laboratory) are listed below for reference.     Microbiology: No results found for this or any previous visit (from the past 240 hour(s)).   Labs: BNP (last 3 results) No results for input(s): BNP in the last 8760 hours. Basic Metabolic Panel:  Recent Labs Lab 07/26/16 0412 07/26/16 0439 07/26/16 0450  NA 139 139  --   K >7.5* 7.9*  --   CL 106 112*  --   CO2 11*  --   --   GLUCOSE 53* 53*  --  BUN 31* 37*  --   CREATININE 2.74* 2.70*  --   CALCIUM 9.5  --   --   MG  --   --  2.5*   Liver Function Tests:  Recent Labs Lab 07/26/16 0412  AST 6,985*  ALT 2,235*  ALKPHOS 222*  BILITOT 3.8*  PROT 7.9  ALBUMIN 3.4*   No results for input(s): LIPASE, AMYLASE in the last 168 hours. No results for input(s): AMMONIA in the last 168 hours. CBC:  Recent Labs Lab 07/26/16 0412 07/26/16 0439  WBC 14.8*  --   NEUTROABS 12.3*  --   HGB 12.8 14.6  HCT 43.4 43.0  MCV 99.3  --   PLT 207  --    Cardiac Enzymes: No results for input(s): CKTOTAL, CKMB, CKMBINDEX, TROPONINI in the last 168 hours. BNP: Invalid input(s): POCBNP CBG: No results for input(s): GLUCAP in the last 168 hours. D-Dimer No results for input(s): DDIMER in the last 72 hours. Hgb A1c No results for input(s): HGBA1C in the last 72 hours. Lipid Profile No results for input(s): CHOL, HDL, LDLCALC, TRIG,  CHOLHDL, LDLDIRECT in the last 72 hours. Thyroid function studies No results for input(s): TSH, T4TOTAL, T3FREE, THYROIDAB in the last 72 hours.  Invalid input(s): FREET3 Anemia work up No results for input(s): VITAMINB12, FOLATE, FERRITIN, TIBC, IRON, RETICCTPCT in the last 72 hours. Urinalysis    Component Value Date/Time   COLORURINE YELLOW 04/14/2015 1624   APPEARANCEUR CLEAR 04/14/2015 1624   LABSPEC 1.020 04/14/2015 1624   PHURINE 7.0 04/14/2015 1624   GLUCOSEU NEGATIVE 04/14/2015 1624   HGBUR LARGE (A) 04/14/2015 1624   BILIRUBINUR NEGATIVE 04/14/2015 1624   KETONESUR NEGATIVE 04/14/2015 1624   PROTEINUR 30 (A) 04/14/2015 1624   UROBILINOGEN 4.0 (H) 12/14/2014 1605   NITRITE POSITIVE (A) 04/14/2015 1624   LEUKOCYTESUR LARGE (A) 04/14/2015 1624   Sepsis Labs Invalid input(s): PROCALCITONIN,  WBC,  LACTICIDVEN Microbiology No results found for this or any previous visit (from the past 240 hour(s)).  Time coordinating discharge: 25 mins  SIGNED:  Irwin Brakeman, MD  [facility] 07/26/2016, 11:16 AM Pager   If 7PM-7AM, please contact night-coverage [email or web address] Password [email or web address]

## 2016-07-26 NOTE — ED Provider Notes (Signed)
By signing my name below, I, Bridgette Habermann, attest that this documentation has been prepared under the direction and in the presence of Denaisha Swango N Kynzli Rease, DO. Electronically Signed: Bridgette Habermann, ED Scribe. 07/26/16. 3:57 AM.  TIME SEEN: 3:51 AM  CHIEF COMPLAINT:  Chief Complaint  Patient presents with  . Hypotension   HPI:  HPI Comments: Margaret [name] is a 76 y.o. female with h/o anoxic brain injury, A-fib on Coumadin, CKD, HLD, HTN, NICM, who presents to the Emergency Department by EMS from [facility] for evaluation of low temperature and low blood pressure beginning just PTA. EMS reports that pt's BP was 94/50 en route and her oral temperature at [facility] was 96. Daughter at bedside reports that pt has been having increased work of breathing and appears to be more "clammy". Daughter reports she has been "talking out of her head". Additionally, pt reports she has been having diarrhea for the past three days. Daughter reports she is not sure if this is true. No known vomiting. Daughter reports 2 days ago they saw the patient at [facility] where she has lived for the past year and a half and she was normal. No known treatments were tried PTA. Pt denies any pain at this time. She denies chest pain, headache, back pain, abdominal pain. No known fevers. Family reports she always has a chronic indwelling Foley catheter. She is a DO NOT RESUSCITATE/DO NOT INTUBATE.  ROS: See HPI Constitutional: no fever  Eyes: no drainage  ENT: no runny nose   Cardiovascular:  no chest pain  Resp: no SOB  GI: no vomiting GU: no dysuria Integumentary: no rash  Allergy: no hives  Musculoskeletal: no leg swelling  Neurological: no slurred speech ROS otherwise negative  PAST MEDICAL HISTORY/PAST SURGICAL HISTORY:  Past Medical History:  Diagnosis Date  . Achalasia    s/p dilation  . Acute blood loss anemia   . Allergic rhinitis   . Anoxic brain injury (HCC) 1996   s/p cardiac arrest; "in a coma for 16 days"  .  Atrial fibrillation (HCC)    amiodarone  . Automatic implantable cardioverter-defibrillator in situ   . Cardiac arrest - ventricular fibrillation    in 1990s  . Cellulitis of right foot   . Chronic kidney disease (CKD), stage IV (severe) (HCC)    [name] 10/03/2013  . Constipation   . Diuretic-induced hypokalemia   . HLD (hyperlipidemia)   . HTN (hypertension)   . Hypothyroidism   . Insomnia   . Lewy body dementia without behavioral disturbance   . Long term (current) use of anticoagulants   . Major depression, chronic   . NICM (nonischemic cardiomyopathy) (HCC)    EF 35%; cath 2/12 no CAD  . Obesity   . Overactive bladder   . Physical deconditioning   . Protein calorie malnutrition (HCC)   . Rectal bleeding   . Recurrent UTI   . Severe mitral regurgitation    s/p complex mitral valve repair with cox maze + LAA clipping, removal of RV lead, and implantation of CRT-D in abdomen  . Systolic CHF, chronic (HCC)   . TIA (transient ischemic attack)   . Vaginal bleeding     MEDICATIONS:  Prior to Admission medications   Medication Sig Start Date End Date Taking? Authorizing Provider  acetaminophen (TYLENOL) 500 MG tablet Take 500 mg by mouth at bedtime.    Historical Provider, MD  fluticasone (FLONASE) 50 MCG/ACT nasal spray Place 2 sprays into both nostrils at  bedtime. For allergies 06/26/10   Historical Provider, MD  folic acid (FOLVITE) 1 MG tablet Take 1 tablet by mouth daily. 08/31/10   Historical Provider, MD  furosemide (LASIX) 20 MG tablet Take 140 mg by mouth. Take 7 tablets to = 140 mg BID    Historical Provider, MD  levothyroxine (SYNTHROID, LEVOTHROID) 50 MCG tablet Take 50 mcg by mouth daily before breakfast.    Historical Provider, MD  loperamide (IMODIUM A-D) 2 MG tablet Take 2-4 mg by mouth as needed for diarrhea or loose stools (Give 4 mg initially, followed by 2 mg after each episode). Not to exceed 16 mg in 24 hours    Historical Provider, MD  loratadine (CLARITIN) 10  MG tablet Take 10 mg by mouth daily.    Historical Provider, MD  Melatonin 5 MG TABS Take 5 mg by mouth at bedtime.     Historical Provider, MD  metoprolol tartrate (LOPRESSOR) 25 MG tablet Take 12.5 mg by mouth 2 (two) times daily.  08/31/10   Historical Provider, MD  Polyethylene Glycol 3350 (MIRALAX PO) Take 17 g by mouth 2 (two) times daily as needed.    Historical Provider, MD  potassium chloride SA (K-DUR,KLOR-CON) 20 MEQ tablet Take 40 mEq by mouth 2 (two) times daily.    Historical Provider, MD  senna-docusate (SENOKOT-S) 8.6-50 MG per tablet Take 1 tablet by mouth at bedtime as needed for mild constipation. 12/18/14   Shanker Levora Dredge, MD  venlafaxine (EFFEXOR) 75 MG tablet Take 75 mg by mouth daily. 07/01/16   Historical Provider, MD  venlafaxine XR (EFFEXOR-XR) 37.5 MG 24 hr capsule Take 37.5 mg by mouth daily with breakfast. Take for 7 days 06/24/16 06/30/16  Historical Provider, MD  warfarin (COUMADIN) 3 MG tablet Take 3 mg by mouth daily.    Historical Provider, MD    ALLERGIES:  No Known Allergies  SOCIAL HISTORY:  Social History  Substance Use Topics  . Smoking status: Never Smoker  . Smokeless tobacco: Never Used  . Alcohol use No    FAMILY HISTORY: Family History  Problem Relation Age of Onset  . Stroke Mother   . Lung cancer Father   . Diabetes Mellitus II Brother     EXAM: BP (!) 131/54 (BP Location: Right Arm)   Pulse 79   Temp 98.3 F (36.8 C) (Rectal)   Resp 20   SpO2 96%  CONSTITUTIONAL: Alert and oriented To person and place but not year which family reports is baseline and responds appropriately to questions. Obese, chronically ill-appearing, afebrile HEAD: Normocephalic EYES: Conjunctivae clear, pupils appear equal, EOMI, no conjunctival pallor ENT: normal nose; dry mucous membranes NECK: Supple, no meningismus, no nuchal rigidity, no LAD  CARD: RRR; S1 and S2 appreciated; no murmurs, no clicks, no rubs, no gallops RESP: Normal chest excursion without  splinting, patient is tachypneic breath sounds clear and equal bilaterally; no wheezes, no rhonchi, no rales, no hypoxia or respiratory distress, speaking full sentences ABD/GI: Normal bowel sounds; non-distended; soft, non-tender, no rebound, no guarding, no peritoneal signs, no hepatosplenomegaly BACK:  The back appears normal and is non-tender to palpation, there is no CVA tenderness EXT: Normal ROM in all joints; non-tender to palpation; no edema; normal capillary refill; no cyanosis, no calf tenderness or swelling    SKIN: Normal color for age and race; warm; no rash NEURO: Moves all extremities equally, no facial droop, no slurred speech, sensation to light touch intact diffusely, cranial nerves II through XII intact PSYCH: The  patient's mood and manner are appropriate. Grooming and personal hygiene are appropriate.  MEDICAL DECISION MAKING: Patient here with hypotension and hypothermia per EMS. Rectal temperature is 98.4 and she has normal blood pressure. She has however tachypneic with clear lungs. She denies feeling short of breath. She does appear slightly dry on exam. Will obtain labs, lactate, chest x-ray, urinalysis and head CT. No focal neurologic deficits on exam the family reports confusion. We'll give IV fluids that she does appear slightly dry.  ED PROGRESS: Patient's labs show a lactate of 11, creatinine of 2.7, potassium is 7.9. Her EKG shows significant QRS widening and QT prolongation likely from her hyperkalemia. Her glucose is 53. We'll give D50 and start 10% dextrose drip. Patient will need IV calcium, insulin, albuterol, bicarbonate.  I am concerned for sepsis. CT of her head is unremarkable. Chest x-ray is clear. She has an indwelling Foley catheter and a urinalysis will be sent. Blood and urine cultures are pending. We will give her 30 mL/kg IV fluid bolus in broad-spectrum antibiotics.   I have had a discussion with patient's son and daughter as well as the patient. They  all confirm that patient is a DO NOT RESUSCITATE/DO NOT INTUBATE but at this time she would want admission, antibiotics, fluids and other medications to try to improve her condition. I have spoken to patient's children separately that I am concerned that patient will likely not survive this hospitalization. They are aware but would want workup, treatment at this time.   5:20 AM  Pt's labs suggest multisystem organ failure. She has acute on chronic renal failure. She appears to have liver failure. Troponin is negative. Will discuss with critical care.  5:25 AM  D/w Dr. Kendrick Fries with critical care. He agrees that this is likely not a survivable illness/insult. Because patient would not escalate care beyond what we are doing currently he does not feel the patient needs to be admitted to an ICU bed. He recommends admission to the hospitalist service and continuing medications to fix her hyperkalemia, fluids and a box for possible sepsis. He states does not appear patient would be a dialysis candidate and family would not want this either. We'll discuss with hospitalist.   6:21 AM Discussed patient's case with hospitalist, Dr. [name] Lundborg.  I have recommended admission and patient (and family if present) agree with this plan. Admitting physician will place admission orders.   I reviewed all nursing notes, vitals, pertinent previous records, EKGs, lab and urine results, imaging (as available).    EKG Interpretation  Date/Time:  Tuesday July 26 2016 16:10:96 EDT Ventricular Rate:  79 PR Interval:    QRS Duration: 185 QT Interval:  623 QTC Calculation: 715 R Axis:   -41 Text Interpretation:  Sinus rhythm Short PR interval Left bundle branch block Confirmed by Lael Wetherbee,  DO, Bryana Froemming ([id number]) on 07/26/2016 4:42:46 AM      CRITICAL CARE Performed by: Raelyn Number   Total critical care time: 65 minutes  Critical care time was exclusive of separately billable procedures and treating other  patients.  Critical care was necessary to treat or prevent imminent or life-threatening deterioration.  Critical care was time spent personally by me on the following activities: development of treatment plan with patient and/or surrogate as well as nursing, discussions with consultants, evaluation of patient's response to treatment, examination of patient, obtaining history from patient or surrogate, ordering and performing treatments and interventions, ordering and review of laboratory studies, ordering and review of radiographic studies,  pulse oximetry and re-evaluation of patient's condition.   I personally performed the services described in this documentation, which was scribed in my presence. The recorded information has been reviewed and is accurate.     Layla Maw Iyanla Eilers, DO 07/26/16 [phone]

## 2016-07-26 NOTE — ED Triage Notes (Signed)
Patient from [facility] and rehab and EMS was called for low vital signs and low temperature.  Patient does have a foley cath, but does not have any complaints at this time.  Patient was found to have hypotension at 94/50 by EMS.  Oral temp at [facility] was 96.  Patient was confused upon EMS picking patient up, but patient is more alert upon arrival to ED.

## 2016-07-26 NOTE — Progress Notes (Addendum)
Pharmacy Antibiotic Note  Margaret [name] is a 76 y.o. female admitted on 07/26/2016 with hypotension.  Pharmacy has been consulted for Vancomycin/Zosyn dosing for r/o sepsis. WBC elevated, noted renal dysfunction, lactic acid markedly elevated.   Plan: Vancomycin 1000 mg IV q48h Zosyn 2.25g IV q8h Trend WBC, temp, renal function  F/U infectious work-up Drug levels as indicated   Height: 5\' 5"  (165.1 cm) Weight: 169 lb (76.7 kg) IBW/kg (Calculated) : 57  Temp (24hrs), Avg:98.3 F (36.8 C), Min:98.3 F (36.8 C), Max:98.3 F (36.8 C)   Recent Labs Lab 07/26/16 0412 07/26/16 0426 07/26/16 0439 07/26/16 0450  WBC 14.8*  --   --   --   CREATININE 2.74*  --  2.70*  --   LATICACIDVEN  --  11.67*  --  10.0*    Estimated Creatinine Clearance: 18.2 mL/min (A) (by C-G formula based on SCr of 2.7 mg/dL (H)).    No Known Allergies   Abran Duke 07/26/2016 6:24 AM

## 2016-07-26 NOTE — Progress Notes (Signed)
eLink Physician-Brief Progress Note Patient Name: Margaret [name] DOB: 1941/03/24 MRN: [id number]   Date of Service  07/26/2016  HPI/Events of Note  Case discussed with Dr. Elesa Massed.  Chart reviewed.  [name] has multiple comorbid illnesses including anoxic encephalopathy, she lives in a nursing facility, and has a confirmed DNR/DNI status.  Tonight she presents with severe sepsis, acute renal failure, lactic acidosis, and hyperkalemia.  She confirmed that she does not want life support measures but desires medical support.    eICU Interventions  Given her multiple comorbid illnesses and her DNR status, have recommended admission to med-surgical status with understanding that she will likely die from this illness.  If she does not respond to conservative treatment (IV fluids, antibiotics) would consider full comfort measures.       Intervention Category Intermediate Interventions: Communication with other healthcare providers and/or family  Max Fickle 07/26/2016, 5:26 AM

## 2016-07-26 NOTE — H&P (Signed)
History and Physical    Margaret [name] WUJ:[id number] DOB: Mar 10, 1941 DOA: 07/26/2016  Referring MD/NP/PA:   PCP: [name], MD   Patient coming from:  The patient is coming from SNF.  At baseline, pt is dependent for most of ADL.   Chief Complaint: Hypothermia, diarrhea, low blood pressure and altered mental status  HPI: Margaret [name] is a 76 y.o. female with medical history significant of hypertension, hyperlipidemia, TIA, sCHF with EF 30-35%, Lewy body dementia, CKD-IV, atrial fibrillation on Coumadin, AICD, hypothyroidism, depression, who presents with hypothermia, diarrhea, low blood pressure and altered mental status.  Per family, pt is from SNF. She was found to have low body temperature with oral temp 96, lower bp 94/50 by EMS.   and diarrhea and confusion. When I saw pt in ED, she is oriented to place and knows her husband, but not oriented to time. Per report, patient was confused upon EMS picking patient up, but patient is more alert upon arrival to ED.  She could answer some questions. She reports diarrhea in the past 3 days. Her daughter states that patient had more than 5 times of loose stool diarrhea today. Patient denies shortness of breath, chest pain or cough. She has a Foley cath in in place, not sure if pt has symptoms for UTI. Per family, patient does not have nausea, vomiting and abdominal pain. She seems to move all extremities normally.  ED Course: pt was found to have  potassium 7.9 with a widening QRS and prolonged QTC, lactic acid 11.6, WBC 14.8, worsening renal function, negative chest x-ray for acute abnormalities, negative CT head for acute intracranial abnormalities, pending urinalysis, procalcitonin 1.30. Pt was treated with insulin, bicarbonate and calcium gluconate for hyperkalemia. Started IV vancomycin and Zosyn. PCCM was called by EDP. Per Dr. Ulyses Jarred note, "Given her multiple comorbid illnesses and her DNR status, have recommended admission to  med-surgical status with understanding that she will likely die from this illness.  If she does not respond to conservative treatment (IV fluids, antibiotics) would consider full comfort measures".   Review of Systems: Could not reviewed accurately due to altered mental status.   Allergy: No Known Allergies  Past Medical History:  Diagnosis Date  . Achalasia    s/p dilation  . Acute blood loss anemia   . Allergic rhinitis   . Anoxic brain injury (HCC) 1996   s/p cardiac arrest; "in a coma for 16 days"  . Atrial fibrillation (HCC)    amiodarone  . Automatic implantable cardioverter-defibrillator in situ   . Cardiac arrest - ventricular fibrillation    in 1990s  . Cellulitis of right foot   . Chronic kidney disease (CKD), stage IV (severe) (HCC)    [name] 10/03/2013  . Constipation   . Diuretic-induced hypokalemia   . HLD (hyperlipidemia)   . HTN (hypertension)   . Hypothyroidism   . Insomnia   . Lewy body dementia without behavioral disturbance   . Long term (current) use of anticoagulants   . Major depression, chronic   . NICM (nonischemic cardiomyopathy) (HCC)    EF 35%; cath 2/12 no CAD  . Obesity   . Overactive bladder   . Physical deconditioning   . Protein calorie malnutrition (HCC)   . Rectal bleeding   . Recurrent UTI   . Severe mitral regurgitation    s/p complex mitral valve repair with cox maze + LAA clipping, removal of RV lead, and implantation of CRT-D in abdomen  . Systolic  CHF, chronic (HCC)   . TIA (transient ischemic attack)   . Vaginal bleeding     Past Surgical History:  Procedure Laterality Date  . Attempted implantation of an implantable cardioverter-  12/30/2004  . CARDIOVERSION  04/14/2010  . CATARACT EXTRACTION, BILATERAL    . CATARACT EXTRACTION, BILATERAL Bilateral   . 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 Cox maze procedure (complete biatrial lesion set with clipping of  07/16/2010  . CYSTOSCOPY W/  STONE MANIPULATION    . Defibrillator generator explantation and reimplantation; and  08/02/2001  . Device migration with anticipated pocket revision  10/29/2003  . FLEXIBLE SIGMOIDOSCOPY N/A 04/17/2015   Procedure: FLEXIBLE SIGMOIDOSCOPY;  Surgeon: Carman Ching, MD;  Location: [facility] ENDOSCOPY;  Service: Endoscopy;  Laterality: N/A;  . LAPAROSCOPIC CHOLECYSTECTOMY    . MEDIAN STERNOTOMY  07/16/2010  . MITRAL VALVE REPAIR  07/16/2010  . Placement of dual chamber pacemaker and implantable cardiac  07/16/2010  . Placement of [place] pulmonary artery catheter via left femoral access.  07/16/2010  . Removal of old endocardial right ventricular defibrillator lead.  07/16/2010    Social History:  reports that she has never smoked. She has never used smokeless tobacco. She reports that she does not drink alcohol or use drugs.  Family History:  Family History  Problem Relation Age of Onset  . Stroke Mother   . Lung cancer Father   . Diabetes Mellitus II Brother      Prior to Admission medications   Medication Sig Start Date End Date Taking? Authorizing Provider  acetaminophen (TYLENOL) 500 MG tablet Take 500 mg by mouth at bedtime.    Historical Provider, MD  fluticasone (FLONASE) 50 MCG/ACT nasal spray Place 2 sprays into both nostrils at bedtime. For allergies 06/26/10   Historical Provider, MD  folic acid (FOLVITE) 1 MG tablet Take 1 tablet by mouth daily. 08/31/10   Historical Provider, MD  furosemide (LASIX) 20 MG tablet Take 140 mg by mouth. Take 7 tablets to = 140 mg BID    Historical Provider, MD  levothyroxine (SYNTHROID, LEVOTHROID) 50 MCG tablet Take 50 mcg by mouth daily before breakfast.    Historical Provider, MD  loperamide (IMODIUM A-D) 2 MG tablet Take 2-4 mg by mouth as needed for diarrhea or loose stools (Give 4 mg initially, followed by 2 mg after each episode). Not to exceed 16 mg in 24 hours    Historical Provider, MD  loratadine (CLARITIN) 10 MG tablet Take 10 mg by mouth  daily.    Historical Provider, MD  Melatonin 5 MG TABS Take 5 mg by mouth at bedtime.     Historical Provider, MD  metoprolol tartrate (LOPRESSOR) 25 MG tablet Take 12.5 mg by mouth 2 (two) times daily.  08/31/10   Historical Provider, MD  Polyethylene Glycol 3350 (MIRALAX PO) Take 17 g by mouth 2 (two) times daily as needed.    Historical Provider, MD  potassium chloride SA (K-DUR,KLOR-CON) 20 MEQ tablet Take 40 mEq by mouth 2 (two) times daily.    Historical Provider, MD  senna-docusate (SENOKOT-S) 8.6-50 MG per tablet Take 1 tablet by mouth at bedtime as needed for mild constipation. 12/18/14   Shanker Levora Dredge, MD  venlafaxine (EFFEXOR) 75 MG tablet Take 75 mg by mouth daily. 07/01/16   Historical Provider, MD  venlafaxine XR (EFFEXOR-XR) 37.5 MG 24 hr capsule Take 37.5 mg by mouth daily with breakfast. Take for 7 days 06/24/16 06/30/16  Historical Provider, MD  warfarin (COUMADIN) 3 MG tablet Take 3 mg by mouth daily.    Historical Provider, MD    Physical Exam: Vitals:   07/26/16 0600 07/26/16 0615 07/26/16 0630 07/26/16 0645  BP: 105/65 (!) 116/53 107/62 (!) 122/56  Pulse: (!) 103 (!) 102 98 100  Resp: (!) 26 (!) 23 (!) 24 (!) 21  Temp:      TempSrc:      SpO2: 100% 99% 100% 100%  Weight:      Height:       General: Not in acute distress HEENT:       Eyes: PERRL, EOMI, no scleral icterus.       ENT: No discharge from the ears and nose, no pharynx injection, no tonsillar enlargement.        Neck: No JVD, no bruit, no mass felt. Heme: No neck lymph node enlargement. Cardiac: S1/S2, RRR, No murmurs, No gallops or rubs. Respiratory: No rales, wheezing, rhonchi or rubs. GI: Soft, nondistended, nontender, no rebound pain, no organomegaly, BS present. GU: No hematuria Ext: has venous insufficiency change bilaterally. Weak DP/PT pulse bilaterally. Musculoskeletal: No joint deformities, No joint redness or warmth, no limitation of ROM in spin. Skin: No rashes.  Neuro: confused, but oriented  to place and knows her husband. Cranial nerves II-XII grossly intact, moves all extremities  Psych: Patient is not psychotic, no suicidal or hemocidal ideation.  Labs on Admission: I have personally reviewed following labs and imaging studies  CBC:  Recent Labs Lab 07/26/16 0412 07/26/16 0439  WBC 14.8*  --   NEUTROABS 12.3*  --   HGB 12.8 14.6  HCT 43.4 43.0  MCV 99.3  --   PLT 207  --    Basic Metabolic Panel:  Recent Labs Lab 07/26/16 0412 07/26/16 0439 07/26/16 0450  NA 139 139  --   K >7.5* 7.9*  --   CL 106 112*  --   CO2 11*  --   --   GLUCOSE 53* 53*  --   BUN 31* 37*  --   CREATININE 2.74* 2.70*  --   CALCIUM 9.5  --   --   MG  --   --  2.5*   GFR: Estimated Creatinine Clearance: 18.2 mL/min (A) (by C-G formula based on SCr of 2.7 mg/dL (H)). Liver Function Tests:  Recent Labs Lab 07/26/16 0412  AST 6,985*  ALT 2,235*  ALKPHOS 222*  BILITOT 3.8*  PROT 7.9  ALBUMIN 3.4*   No results for input(s): LIPASE, AMYLASE in the last 168 hours. No results for input(s): AMMONIA in the last 168 hours. Coagulation Profile: No results for input(s): INR, PROTIME in the last 168 hours. Cardiac Enzymes: No results for input(s): CKTOTAL, CKMB, CKMBINDEX, TROPONINI in the last 168 hours. BNP (last 3 results) No results for input(s): PROBNP in the last 8760 hours. HbA1C: No results for input(s): HGBA1C in the last 72 hours. CBG: No results for input(s): GLUCAP in the last 168 hours. Lipid Profile: No results for input(s): CHOL, HDL, LDLCALC, TRIG, CHOLHDL, LDLDIRECT in the last 72 hours. Thyroid Function Tests: No results for input(s): TSH, T4TOTAL, FREET4, T3FREE, THYROIDAB in the last 72 hours. Anemia Panel: No results for input(s): VITAMINB12, FOLATE, FERRITIN, TIBC, IRON, RETICCTPCT in the last 72 hours. Urine analysis:    Component Value Date/Time   COLORURINE YELLOW 04/14/2015 1624   APPEARANCEUR CLEAR 04/14/2015 1624   LABSPEC 1.020 04/14/2015 1624     PHURINE 7.0 04/14/2015 1624  GLUCOSEU NEGATIVE 04/14/2015 1624   HGBUR LARGE (A) 04/14/2015 1624   BILIRUBINUR NEGATIVE 04/14/2015 1624   KETONESUR NEGATIVE 04/14/2015 1624   PROTEINUR 30 (A) 04/14/2015 1624   UROBILINOGEN 4.0 (H) 12/14/2014 1605   NITRITE POSITIVE (A) 04/14/2015 1624   LEUKOCYTESUR LARGE (A) 04/14/2015 1624   Sepsis Labs: @LABRCNTIP (procalcitonin:4,lacticidven:4) )No results found for this or any previous visit (from the past 240 hour(s)).   Radiological Exams on Admission: Dg Chest 1 View  Result Date: 07/26/2016 CLINICAL DATA:  Increasing dyspnea for 2 days EXAM: CHEST 1 VIEW COMPARISON:  12/17/2014 FINDINGS: Unchanged cardiomegaly. No airspace consolidation. Normal pulmonary vasculature. No large pleural effusion. IMPRESSION: No consolidation or effusion. Electronically Signed   By: Ellery Plunk M.D.   On: 07/26/2016 04:56   Ct Head Wo Contrast  Result Date: 07/26/2016 CLINICAL DATA:  76 y/o F; history of anoxic brain injury presenting with altered mental status, diarrhea, and increased respiratory rate. EXAM: CT HEAD WITHOUT CONTRAST TECHNIQUE: Contiguous axial images were obtained from the base of the skull through the vertex without intravenous contrast. COMPARISON:  12/05/2012 CT of the head. FINDINGS: Brain: No evidence of acute infarction, hemorrhage, hydrocephalus, extra-axial collection or mass lesion/mass effect. Stable extensive hypoattenuation of subcortical and periventricular white matter compatible with advanced microvascular ischemic changes. Stable moderate brain parenchymal volume loss. 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 Vascular: Extensive calcific atherosclerosis of cavernous and paraclinoid internal carotid arteries. Skull: Motion artifact of the skullbase. No displaced calvarial fracture identified. Sinuses/Orbits: No acute finding. Other: None. IMPRESSION: 1. No acute  intracranial abnormality. 2. Stable advanced chronic microvascular ischemic changes and moderate parenchymal volume loss of the brain. 3. 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 Electronically Signed   By: Mitzi Hansen M.D.   On: 07/26/2016 04:53     EKG: Independently reviewed.  Sinus rhythm, QTC 715, QRS widening, left bundle blockage, LAD  Assessment/Plan Principal Problem:   Comfort measures only status Active Problems:   Automatic implantable cardioverter-defibrillator in situ   CKD (chronic kidney disease), stage III   Hypertension   Hyperlipidemia   Long-term (current) use of anticoagulants   History of TIA (transient ischemic attack) withour residua   Prolonged Q-T interval on ECG   Chronic systolic congestive heart failure (HCC)   Chronic atrial fibrillation (HCC)   Venous stasis of lower extremity   Severe sepsis (HCC)   Acute renal failure superimposed on stage 4 chronic kidney disease (HCC)   Abnormal LFTs   Hyperkalemia   Hypoglycemia   Diarrhea   Prolongation of QRS complex on electrocardiography   Acute encephalopathy  Comfort measures only status:  Pt has multiple chronic comorbidities, and presents with multiple serious issues as listed below, particularly severe sepsis, severe hyperkalemia with QRS widening and prolonged QTC and worsening renal function and liver failure. Pt is DNR. She has sCHF with EF 30-35%, which limits aggressive IV fluids treatment. Patient's prognosis is extremely poor. I spent lengthy time with her family to have discussed with patient and her power of attorney (her daughter), and confirmed that patient wanted to be DO NOT RESUSCITATE and comfort care only  -Will admit to med-surg bed as inpt -Code Status:DNR/DNI-comfort care -no artificial feeding or hydration -no further diagnostics or interventions -Stop drawing labs and IVF -LORazepam 2 MG prn q4h SL for agitation -morphine  20 MG/ML concentrated solution (ROXANOL ): 5 mg prn SL q2h for pain  or shortness of breath.  -Naphazoline 0.1 % ophthalmic solution:  Prn q6h for eye irritation -scopolamine 1.5 MG patch (1.5 mg total) onto the skin every 3 (three)  -Psycho/Social: emotional support offered to patient and family at bedside -consult to palliterative care team in AM    Active Problems:   Automatic implantable cardioverter-defibrillator in situ   CKD (chronic kidney disease), stage III   Hypertension   Hyperlipidemia   Long-term (current) use of anticoagulants   History of TIA (transient ischemic attack) withour residua   Prolonged Q-T interval on ECG   Chronic systolic congestive heart failure (HCC)   Chronic atrial fibrillation (HCC)   Venous stasis of lower extremity   Severe sepsis (HCC)   Acute renal failure superimposed on stage 4 chronic kidney disease (HCC)   Abnormal LFTs   Hyperkalemia   Hypoglycemia   Diarrhea   Prolongation of QRS complex on electrocardiography   Acute encephalopathy   DVT ppx: none Code Status: DNR Family Communication:  Yes, patient's family, daughter, son-in-law, son and daughter-In-Law, Husband at bed side. Her daughter is power of attorney. Disposition Plan:  To be determined Consults called:  PCCM, Dr. Kendrick Fries Admission status:  medical floor/inpt  Date of Service 07/26/2016    Lorretta Harp [facility] Pager [phone]  If 7PM-7AM, please contact night-coverage [email or web address] Password [email or web address] 07/26/2016, 7:17 AM

## 2016-07-26 NOTE — Progress Notes (Addendum)
Palliative Medicine RN Note: Rec'd consult from [facility]; pt still in ED. Pt has severe sepsis with severe kyperkalemia w QRS widening and prolonged QRC, worsening renal fx, liver failure, CHF, hx AICD, hx cardiac arrest/vfib, lewy body dementia, malnutrition, TIA, mitral regurgitation. Goal is comfort with no aggressive measures to be taken  Discussed goals with pt and family. They unanimously would like a transfer to [facility]; offered choice and this is their decision. Attempted to call [facility] reps but got no answer; called referral in to office. Sent update to Dr Maryln Manuel, [facility] attending. Waiting on return call from [facility].   Spoke w ED RN and Dr Theodosia Quay. Pt does not have room assignment yet. Hopeful to send her to [facility] straight from ED. Waiting on [facility].  Margret Chance Thy Gullikson, RN, BSN, [facility] 07/26/2016 8:41 AM Cell [phone] 8:00-4:00 Monday-Friday Office [phone]  Spoke with family. Pt has old AICD in abdomen; family states battery is dead. Spoke with Carley Hammed with [facility]; she is just arriving at [facility] and will eval chart.  Margret Chance Baxter Gonzalez, RN, BSN, [facility] 07/26/2016 9:11 AM Cell [phone] 8:00-4:00 Monday-Friday Office [phone]  ED RN called; family concerned AICD may go off and would like [employer] to come check and turn off if necessary. Spoke w [employer] rep; will be here within 30 minutes.  Margret Chance Jeanne Terrance, RN, BSN, [facility] 07/26/2016 9:36 AM Cell [phone] 8:00-4:00 Monday-Friday Office [phone]

## 2016-07-31 LAB — CULTURE, BLOOD (ROUTINE X 2)
CULTURE: NO GROWTH
Culture: NO GROWTH
SPECIAL REQUESTS: ADEQUATE
Special Requests: ADEQUATE

## 2016-08-05 ENCOUNTER — Encounter: Payer: Self-pay | Admitting: Adult Health

## 2016-08-05 ENCOUNTER — Non-Acute Institutional Stay (SKILLED_NURSING_FACILITY): Payer: Medicare Other | Admitting: Adult Health

## 2016-08-05 DIAGNOSIS — I482 Chronic atrial fibrillation, unspecified: Secondary | ICD-10-CM

## 2016-08-05 DIAGNOSIS — F329 Major depressive disorder, single episode, unspecified: Secondary | ICD-10-CM | POA: Diagnosis not present

## 2016-08-05 DIAGNOSIS — E038 Other specified hypothyroidism: Secondary | ICD-10-CM

## 2016-08-05 DIAGNOSIS — N3289 Other specified disorders of bladder: Secondary | ICD-10-CM

## 2016-08-05 DIAGNOSIS — N179 Acute kidney failure, unspecified: Secondary | ICD-10-CM

## 2016-08-05 DIAGNOSIS — N184 Chronic kidney disease, stage 4 (severe): Secondary | ICD-10-CM | POA: Diagnosis not present

## 2016-08-05 DIAGNOSIS — G894 Chronic pain syndrome: Secondary | ICD-10-CM

## 2016-08-05 DIAGNOSIS — I5022 Chronic systolic (congestive) heart failure: Secondary | ICD-10-CM | POA: Diagnosis not present

## 2016-08-05 NOTE — Progress Notes (Signed)
DATE:  08/05/2016  MRN:  [id number]  BIRTHDAY: [birth year]  Facility:  Nursing Home Location:  [name]  Nursing Home Room Number: 804-B  LEVEL OF CARE:  SNF (31)  Contact Information    Name Relation Home Work Mobile   Hawks,Vicky Daughter [phone]  [phone]   Catrice, Zuleta) Spouse [phone]  [phone]   Simic,Chris Son   [phone]       Code Status History    Date Active Date Inactive Code Status Order ID Comments User Context   07/26/2016  6:51 AM 07/26/2016  3:57 PM DNR [id number]  Lorretta Harp, MD ED   07/26/2016  4:56 AM 07/26/2016  6:51 AM DNR [id number]  Layla Maw Ward, DO ED   04/16/2015  3:51 PM 04/22/2015  4:47 PM DNR [id number]  Russella Dar, NP ED   12/14/2014  8:52 PM 12/18/2014  5:40 PM Full Code [id number]  Inez Catalina, MD Inpatient   07/18/2014  1:12 AM 07/19/2014  5:47 PM Full Code [id number]  Lorretta Harp, MD Inpatient   11/27/2013  6:36 PM 12/02/2013  8:03 PM DNR [id number]  Hollice Espy, MD Inpatient   10/03/2013  8:24 PM 10/07/2013  9:06 PM Full Code [id number]  Eduard Clos, MD Inpatient   03/30/2013 11:22 PM 04/02/2013  5:02 PM Full Code [id number]  Therisa Doyne, MD Inpatient   03/24/2012  7:29 PM 03/26/2012  8:33 PM Partial Code [id number]  Cristal Ford, MD ED    Questions for Most Recent Historical Code Status (Order [id number])    Question Answer Comment   In the event of cardiac or respiratory ARREST Do not call a "code blue"    In the event of cardiac or respiratory ARREST Do not perform Intubation, CPR, defibrillation or ACLS    In the event of cardiac or respiratory ARREST Use medication by any route, position, wound care, and other measures to relive pain and suffering. May use oxygen, suction and manual treatment of airway obstruction as needed for comfort.        Chief Complaint  Patient presents with  . Hospitalization Follow-up    HISTORY OF PRESENT ILLNESS:  This is a 75-YO female who has been  readmitted to [facility] on 08/04/2016 for long-term care, after a short stay at [facility], which was determined not to be an appropriate placement for her at this time. She was transferred from emergency room to [facility] with acute renal failure, complicated with hyperkalemia, acute hepatitis, diarrhea, hypothermia and leukocytosis unknown etiology. Sepsis was suspected. She, also, has systolic heart failure and non-ischemic cardiomyopathy. The family requested for comfort care. Her AICD has been deactivated.   She was seen in the room today and noted to have LUE edema, 3. No pulse was appreciated on her left wrist. She used to be on Coumadin for atrial fibrillation but was discontinued. Talked to hospice nurse and have ordered Lasix 20 mg daily and for BMP on 08/08/16. Patient's family requesting medication for bladder spasm.    PAST MEDICAL HISTORY:  Past Medical History:  Diagnosis Date  . Achalasia    s/p dilation  . Acute blood loss anemia   . Allergic rhinitis   . Anoxic brain injury (HCC) 1996   s/p cardiac arrest; "in a coma for 16 days"  . Atrial fibrillation (HCC)    amiodarone  . Automatic implantable cardioverter-defibrillator in situ   . Cardiac arrest - ventricular fibrillation  in 1990s  . Cellulitis of right foot   . Chronic kidney disease (CKD), stage IV (severe) (HCC)    [name] 10/03/2013  . Constipation   . Diuretic-induced hypokalemia   . HLD (hyperlipidemia)   . HTN (hypertension)   . Hypothyroidism   . Insomnia   . Lewy body dementia without behavioral disturbance   . Long term (current) use of anticoagulants   . Major depression, chronic   . NICM (nonischemic cardiomyopathy) (HCC)    EF 35%; cath 2/12 no CAD  . Obesity   . Overactive bladder   . Physical deconditioning   . Protein calorie malnutrition (HCC)   . Rectal bleeding   . Recurrent UTI   . Severe mitral regurgitation    s/p complex mitral valve repair with cox maze  + LAA clipping, removal of RV lead, and implantation of CRT-D in abdomen  . Systolic CHF, chronic (HCC)   . TIA (transient ischemic attack)   . Vaginal bleeding      CURRENT MEDICATIONS: Reviewed  Patient's Medications  New Prescriptions   No medications on file  Previous Medications   LEVOTHYROXINE (SYNTHROID, LEVOTHROID) 50 MCG TABLET    Take 50 mcg by mouth daily before breakfast.   METOPROLOL TARTRATE (LOPRESSOR) 25 MG TABLET    Take 12.5 mg by mouth 2 (two) times daily.    MORPHINE 20 MG/5ML SOLUTION    Take 5-10 mg by mouth every 4 (four) hours as needed for pain.   VENLAFAXINE (EFFEXOR) 75 MG TABLET    Take 75 mg by mouth daily.  Modified Medications   No medications on file  Discontinued Medications   ACETAMINOPHEN (TYLENOL) 500 MG TABLET    Take 500 mg by mouth at bedtime.   FLUTICASONE (FLONASE) 50 MCG/ACT NASAL SPRAY    Place 2 sprays into both nostrils at bedtime. For allergies   FOLIC ACID (FOLVITE) 1 MG TABLET    Take 1 tablet by mouth daily.   FUROSEMIDE (LASIX) 20 MG TABLET    Take 140 mg by mouth 2 (two) times daily. Take 7 tablets to = 140 mg BID    LOPERAMIDE (IMODIUM A-D) 2 MG TABLET    Take 2-4 mg by mouth 4 (four) times daily as needed for diarrhea or loose stools (Give 4 mg initially, followed by 2 mg after each episode). Not to exceed 16 mg in 24 hours    LORATADINE (CLARITIN) 10 MG TABLET    Take 10 mg by mouth daily.   MELATONIN 5 MG TABS    Take 5 mg by mouth at bedtime.    POLYETHYLENE GLYCOL 3350 (MIRALAX PO)    Take 17 g by mouth 2 (two) times daily as needed.   POTASSIUM CHLORIDE SA (K-DUR,KLOR-CON) 20 MEQ TABLET    Take 40 mEq by mouth 2 (two) times daily.   SENNA-DOCUSATE (SENOKOT-S) 8.6-50 MG PER TABLET    Take 1 tablet by mouth at bedtime as needed for mild constipation.   WARFARIN (COUMADIN) 3 MG TABLET    Take 3 mg by mouth daily.     No Known Allergies   REVIEW OF SYSTEMS:  GENERAL: no change in appetite, no fatigue, no weight changes, no  fever, chills or weakness EYES: Denies change in vision, dry eyes, eye pain, itching or discharge EARS: Denies change in hearing, ringing in ears, or earache NOSE: Denies nasal congestion or epistaxis MOUTH and THROAT: Denies oral discomfort, gingival pain or bleeding, pain from teeth or hoarseness  RESPIRATORY: no cough, SOB, DOE, wheezing, hemoptysis CARDIAC: no chest pain, or palpitations, +edema GI: no abdominal pain, diarrhea, constipation, heart burn, nausea or vomiting GU: Denies dysuria, frequency, hematuria, incontinence, or discharge PSYCHIATRIC: Denies feeling of depression or anxiety. No report of hallucinations, insomnia, paranoia, or agitation    PHYSICAL EXAMINATION  GENERAL APPEARANCE: Well nourished. In no acute distress. Obese SKIN:  Bilateral lower legs wrapped with unna boots HEAD: Normal in size and contour. No evidence of trauma EYES: Lids open and close normally. No blepharitis, entropion or ectropion. PERRL. Conjunctivae are clear and sclerae are white. Lenses are without opacity EARS: Pinnae are normal. Patient hears normal voice tunes of the examiner MOUTH and THROAT: Lips are without lesions. Oral mucosa is moist and without lesions. Tongue is normal in shape, size, and color and without lesions NECK: supple, trachea midline, no neck masses, no thyroid tenderness, no thyromegaly LYMPHATICS: no LAN in the neck, no supraclavicular LAN RESPIRATORY: breathing is even & unlabored, BS CTAB CARDIAC: Irregularly irregular, no murmur,no extra heart sounds, no edema GI: abdomen soft, normal BS, no masses, no tenderness, no hepatomegaly, no splenomegaly GU:  Has foley catheter draining to urine bag EXTREMITIES:  Able to move X 4 extremities; BLE has generalized weakness PSYCHIATRIC: Alert to self and place, disoriented to time. Affect and behavior are appropriate   LABS/RADIOLOGY: Labs reviewed: Basic Metabolic Panel:  Recent Labs  81/19/14 07/26/16 0412  07/26/16 0439 07/26/16 0450  NA 143 139 139  --   K 4.0 >7.5* 7.9*  --   CL  --  106 112*  --   CO2  --  11*  --   --   GLUCOSE  --  53* 53*  --   BUN 25* 31* 37*  --   CREATININE 1.6* 2.74* 2.70*  --   CALCIUM  --  9.5  --   --   MG  --   --   --  2.5*   Liver Function Tests:  Recent Labs  08/19/15 11/13/15 07/26/16 0412  AST 16 18 6,985*  ALT 9 11 2,235*  ALKPHOS 103 87 222*  BILITOT  --   --  3.8*  PROT  --   --  7.9  ALBUMIN  --   --  3.4*   CBC:  Recent Labs  05/27/16 06/01/16 07/26/16 0412 07/26/16 0439  WBC 5.4 5.1 14.8*  --   NEUTROABS 3 3 12.3*  --   HGB 11.7* 11.6* 12.8 14.6  HCT 38 38 43.4 43.0  MCV  --   --  99.3  --   PLT 182 214 207  --     Dg Chest 1 View  Result Date: 07/26/2016 CLINICAL DATA:  Increasing dyspnea for 2 days EXAM: CHEST 1 VIEW COMPARISON:  12/17/2014 FINDINGS: Unchanged cardiomegaly. No airspace consolidation. Normal pulmonary vasculature. No large pleural effusion. IMPRESSION: No consolidation or effusion. Electronically Signed   By: Ellery Plunk M.D.   On: 07/26/2016 04:56   Ct Head Wo Contrast  Result Date: 07/26/2016 CLINICAL DATA:  76 y/o F; history of anoxic brain injury presenting with altered mental status, diarrhea, and increased respiratory rate. EXAM: CT HEAD WITHOUT CONTRAST TECHNIQUE: Contiguous axial images were obtained from the base of the skull through the vertex without intravenous contrast. COMPARISON:  12/05/2012 CT of the head. FINDINGS: Brain: No evidence of acute infarction, hemorrhage, hydrocephalus, extra-axial collection or mass lesion/mass effect. Stable extensive hypoattenuation of subcortical and periventricular white matter compatible with advanced microvascular  ischemic changes. Stable moderate brain parenchymal volume loss. 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 Vascular: Extensive calcific atherosclerosis of cavernous and paraclinoid  internal carotid arteries. Skull: Motion artifact of the skullbase. No displaced calvarial fracture identified. Sinuses/Orbits: No acute finding. Other: None. IMPRESSION: 1. No acute intracranial abnormality. 2. Stable advanced chronic microvascular ischemic changes and moderate parenchymal volume loss of the brain. 3. 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 Electronically Signed   By: Mitzi Hansen M.D.   On: 07/26/2016 04:53    ASSESSMENT/PLAN:    Acute on chronic kidney disease stage 4-  will monitor Lab Results  Component Value Date   CREATININE 2.70 (H) 07/26/2016   Chronic systolic heart failure - no SOB was noted, noted to have LUE edema 3+, start Lasix 20 mg 1 tab PO Q D, BMP on 08/08/16  Major depression - continue Effexor XR 75 mg 1 capsule by mouth daily  Hypothyroidism - continue Synthroid 50 g 1 tab by mouth daily Lab Results  Component Value Date   TSH 0.94 12/17/2015   Chronic atrial fibrillation - continue metoprolol tartrate 25 mg 1/2 tab = 12.5 mg by mouth twice a day, Coumadin was discontinued recently; will be followed-up by hospice  Bladder spasm -  Start Myrbetriq 25 mg 1 tab PO Q D  Chronic pain - continue morphine sulfate 20 mg/ML give 5-10 mg PO Q 4 hours PRN     Goals of care:  Long-term care, Hospice/comfort care    Monina C. Medina-Vargas - NP    [employer] [phone]

## 2016-08-08 ENCOUNTER — Encounter: Payer: Self-pay | Admitting: Internal Medicine

## 2016-08-08 ENCOUNTER — Non-Acute Institutional Stay (SKILLED_NURSING_FACILITY): Payer: Medicare Other | Admitting: Internal Medicine

## 2016-08-08 DIAGNOSIS — N183 Chronic kidney disease, stage 3 unspecified: Secondary | ICD-10-CM

## 2016-08-08 DIAGNOSIS — F341 Dysthymic disorder: Secondary | ICD-10-CM | POA: Diagnosis not present

## 2016-08-08 DIAGNOSIS — F329 Major depressive disorder, single episode, unspecified: Secondary | ICD-10-CM

## 2016-08-08 DIAGNOSIS — I5022 Chronic systolic (congestive) heart failure: Secondary | ICD-10-CM | POA: Diagnosis not present

## 2016-08-08 DIAGNOSIS — R531 Weakness: Secondary | ICD-10-CM

## 2016-08-08 DIAGNOSIS — I482 Chronic atrial fibrillation, unspecified: Secondary | ICD-10-CM

## 2016-08-08 DIAGNOSIS — J9611 Chronic respiratory failure with hypoxia: Secondary | ICD-10-CM | POA: Diagnosis not present

## 2016-08-08 DIAGNOSIS — E039 Hypothyroidism, unspecified: Secondary | ICD-10-CM

## 2016-08-08 DIAGNOSIS — N3281 Overactive bladder: Secondary | ICD-10-CM

## 2016-08-08 NOTE — Progress Notes (Signed)
LOCATION: [name]  PCP: [name], MD   Code Status: DNR  Goals of care: Advanced Directive information Advanced Directives 08/08/2016  Does Patient Have a Medical Advance Directive? Yes  Type of Advance Directive Out of facility DNR (pink MOST or yellow form)  Does patient want to make changes to medical advance directive? No - Patient declined  Copy of Healthcare Power of Attorney in Chart? -  Would patient like information on creating a medical advance directive? -  Pre-existing out of facility DNR order (yellow form or pink MOST form) -       Extended Emergency Contact Information Primary Emergency Contact: Hawks,Vicky Address: [address]          [place], [place] [address] [place] Home Phone: [phone] Mobile Phone: [phone] Relation: Daughter Secondary Emergency Contact: Luanna Salk) Address: [address]          [place], [place] Home Phone: [phone] Mobile Phone: [phone] Relation: Spouse   No Known Allergies  Chief Complaint  Patient presents with  . New Admit To SNF    New Admission Visit      HPI:  Patient is a 76 y.o. female seen today for Readmission visit. She was a long-term care resident here who was sent to the emergency room with concern for sepsis with acute renal failure and was then transferred to [facility] place from there for comfort care. Of note she has an AICD that has been deactivated. She is here for long-term care with comfort being the main: Of care. She is followed by hospice service now. She is seen in her room today. She has medical history of hypertension, nonischemic cardiomyopathy, atrial fibrillation, anoxic brain injury, chronic kidney disease, hyperlipidemia among others.  Review of Systems:  Constitutional: Negative for fever, chills, diaphoresis. Feels weak and tired. HENT: Negative for headache, congestion, nasal discharge, difficulty swallowing.     Eyes: Negative for double vision and discharge. Wears glasses. Respiratory: Negative for wheezing.   positive for occasional cough and dyspnea. Cardiovascular: Negative for chest pain, palpitations.  positive for swelling to her arms and legs Gastrointestinal: Negative for heartburn, nausea, vomiting, abdominal pain, loss of appetite. Last bowel movement was this morning. Genitourinary: Has chronic indwelling Foley catheter. Musculoskeletal: Negative for fall in the facility.  Skin: Negative for itching, rash.  Neurological: Negative for dizziness. Psychiatric/Behavioral: Negative for depression   Past Medical History:  Diagnosis Date  . Achalasia    s/p dilation  . Acute blood loss anemia   . Allergic rhinitis   . Anoxic brain injury (HCC) 1996   s/p cardiac arrest; "in a coma for 16 days"  . Atrial fibrillation (HCC)    amiodarone  . Automatic implantable cardioverter-defibrillator in situ   . Cardiac arrest - ventricular fibrillation    in 1990s  . Cellulitis of right foot   . Chronic kidney disease (CKD), stage IV (severe) (HCC)    [name] 10/03/2013  . Constipation   . Diuretic-induced hypokalemia   . HLD (hyperlipidemia)   . HTN (hypertension)   . Hypothyroidism   . Insomnia   . Lewy body dementia without behavioral disturbance   . Long term (current) use of anticoagulants   . Major depression, chronic   . NICM (nonischemic cardiomyopathy) (HCC)    EF 35%; cath 2/12 no CAD  . Obesity   . Overactive bladder   . Physical deconditioning   . Protein calorie malnutrition (HCC)   .  Rectal bleeding   . Recurrent UTI   . Severe mitral regurgitation    s/p complex mitral valve repair with cox maze + LAA clipping, removal of RV lead, and implantation of CRT-D in abdomen  . Systolic CHF, chronic (HCC)   . TIA (transient ischemic attack)   . Vaginal bleeding    Past Surgical History:  Procedure Laterality Date  . Attempted implantation of an implantable cardioverter-   12/30/2004  . CARDIOVERSION  04/14/2010  . CATARACT EXTRACTION, BILATERAL    . CATARACT EXTRACTION, BILATERAL Bilateral   . 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 Cox maze procedure (complete biatrial lesion set with clipping of  07/16/2010  . CYSTOSCOPY W/ STONE MANIPULATION    . Defibrillator generator explantation and reimplantation; and  08/02/2001  . Device migration with anticipated pocket revision  10/29/2003  . FLEXIBLE SIGMOIDOSCOPY N/A 04/17/2015   Procedure: FLEXIBLE SIGMOIDOSCOPY;  Surgeon: Carman Ching, MD;  Location: [facility] ENDOSCOPY;  Service: Endoscopy;  Laterality: N/A;  . LAPAROSCOPIC CHOLECYSTECTOMY    . MEDIAN STERNOTOMY  07/16/2010  . MITRAL VALVE REPAIR  07/16/2010  . Placement of dual chamber pacemaker and implantable cardiac  07/16/2010  . Placement of [place]anz pulmonary artery catheter via left femoral access.  07/16/2010  . Removal of old endocardial right ventricular defibrillator lead.  07/16/2010   Social History:   reports that she has never smoked. She has never used smokeless tobacco. She reports that she does not drink alcohol or use drugs.  Family History  Problem Relation Age of Onset  . Stroke Mother   . Lung cancer Father   . Diabetes Mellitus II Brother     Medications: Allergies as of 08/08/2016   No Known Allergies     Medication List       Accurate as of 08/08/16  2:32 PM. Always use your most recent med list.          furosemide 20 MG tablet Commonly known as:  LASIX Take 20 mg by mouth daily.   levothyroxine 50 MCG tablet Commonly known as:  SYNTHROID, LEVOTHROID Take 50 mcg by mouth daily before breakfast.   metoprolol tartrate 25 MG tablet Commonly known as:  LOPRESSOR Take 12.5 mg by mouth 2 (two) times daily.   morphine 20 MG/5ML solution Take 5-10 mg by mouth every 4 (four) hours as needed for pain.   MYRBETRIQ 25 MG Tb24 tablet Generic drug:  mirabegron ER Take 25 mg by mouth  daily.   venlafaxine 75 MG tablet Commonly known as:  EFFEXOR Take 75 mg by mouth daily.       Immunizations: Immunization History  Administered Date(s) Administered  . PPD Test 11/05/2014, 11/19/2014     Physical Exam: Vitals:   08/08/16 1426  BP: (!) 138/91  Pulse: 72  Resp: 18  Temp: 97.6 F (36.4 C)  TempSrc: Oral  SpO2: 94%  Weight: 181 lb 3.2 oz (82.2 kg)  Height: 5\' 5"  (1.651 m)   Body mass index is 30.15 kg/m.  General- elderly female, obese, chronically ill appearing, in no acute distress Head- normocephalic, atraumatic Nose- no maxillary or frontal sinus tenderness Throat- moist mucus membrane, normal oropharynx Eyes- PERRLA, EOMI, no pallor, no icterus, no discharge Neck- no cervical lymphadenopathy Cardiovascular- irregular heart rate, no murmur Respiratory- bilateral poor air movement, no wheeze, no rhonchi, no crackles, no use of accessory muscles Abdomen- bowel sounds present, soft, non tender, no guarding or rigidity, has urinary  catheter draining dark colored urine Musculoskeletal- able to move all 4 extremities, generalized weakness, generalized anasarca Neurological- alert and oriented to person and place but not to time Skin- warm and dry, compression wrap to both legs and has dressing to left arm Psychiatry- normal mood and affect    Labs reviewed: Basic Metabolic Panel:  Recent Labs  16/10/96 07/26/16 0412 07/26/16 0439 07/26/16 0450  NA 143 139 139  --   K 4.0 >7.5* 7.9*  --   CL  --  106 112*  --   CO2  --  11*  --   --   GLUCOSE  --  53* 53*  --   BUN 25* 31* 37*  --   CREATININE 1.6* 2.74* 2.70*  --   CALCIUM  --  9.5  --   --   MG  --   --   --  2.5*   Liver Function Tests:  Recent Labs  08/19/15 11/13/15 07/26/16 0412  AST 16 18 6,985*  ALT 9 11 2,235*  ALKPHOS 103 87 222*  BILITOT  --   --  3.8*  PROT  --   --  7.9  ALBUMIN  --   --  3.4*   No results for input(s): LIPASE, AMYLASE in the last 8760 hours. No  results for input(s): AMMONIA in the last 8760 hours. CBC:  Recent Labs  05/27/16 06/01/16 07/26/16 0412 07/26/16 0439  WBC 5.4 5.1 14.8*  --   NEUTROABS 3 3 12.3*  --   HGB 11.7* 11.6* 12.8 14.6  HCT 38 38 43.4 43.0  MCV  --   --  99.3  --   PLT 182 214 207  --    Cardiac Enzymes: No results for input(s): CKTOTAL, CKMB, CKMBINDEX, TROPONINI in the last 8760 hours. BNP: Invalid input(s): POCBNP CBG: No results for input(s): GLUCAP in the last 8760 hours.  Radiological Exams: Dg Chest 1 View  Result Date: 07/26/2016 CLINICAL DATA:  Increasing dyspnea for 2 days EXAM: CHEST 1 VIEW COMPARISON:  12/17/2014 FINDINGS: Unchanged cardiomegaly. No airspace consolidation. Normal pulmonary vasculature. No large pleural effusion. IMPRESSION: No consolidation or effusion. Electronically Signed   By: Ellery Plunk M.D.   On: 07/26/2016 04:56   Ct Head Wo Contrast  Result Date: 07/26/2016 CLINICAL DATA:  76 y/o F; history of anoxic brain injury presenting with altered mental status, diarrhea, and increased respiratory rate. EXAM: CT HEAD WITHOUT CONTRAST TECHNIQUE: Contiguous axial images were obtained from the base of the skull through the vertex without intravenous contrast. COMPARISON:  12/05/2012 CT of the head. FINDINGS: Brain: No evidence of acute infarction, hemorrhage, hydrocephalus, extra-axial collection or mass lesion/mass effect. Stable extensive hypoattenuation of subcortical and periventricular white matter compatible with advanced microvascular ischemic changes. Stable moderate brain parenchymal volume loss. 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 Vascular: Extensive calcific atherosclerosis of cavernous and paraclinoid internal carotid arteries. Skull: Motion artifact of the skullbase. No displaced calvarial fracture identified. Sinuses/Orbits: No acute finding. Other: None. IMPRESSION: 1. No acute intracranial  abnormality. 2. Stable advanced chronic microvascular ischemic changes and moderate parenchymal volume loss of the brain. 3. 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 Electronically Signed   By: Mitzi Hansen M.D.   On: 07/26/2016 04:53    Assessment/Plan  Generalized weakness Under hospice services. Appears to be fluid overloaded but comfortable otherwise. Make changes to her diuretic as below. Encourage to  be OOB as tolerated.   Chronic systolic congestive heart failure Appears fluid overloaded on exam. Currently on Lasix 20 mg daily. On chart review she has chronic kidney disease stage IV. Discontinue her Lasix and place her on torsemide 40 mg daily for now. Monitor her urine output. Pending BMP. Continue metoprolol tartrate 12.5 mg twice a day.  afib Controlled HR, continue metoprolol as above  OAB Has Foley catheter in place. Currently on myrbetriq 25 mg daily. Monitor clinically.  Chronic kidney disease stage 3 Monitor BMP. Monitor urine output.  Chronic respiratory failure From deconditioning and CHF. With goals of care being comfort care, continue morphine 5-10 mg every 4 as needed for dyspnea. Followed by hospice care services. place oxygen by nasal cannula for comfort with goal o2 sat >90%.  Hypothyroidism Continue levothyroxine 50 g daily.  Lab Results  Component Value Date   TSH 0.94 12/17/2015   Chronic depression Continue venlafaxine 75 mg daily and monitor   Goals of care: long term care   Labs/tests ordered: bmp 08/15/16  Family/ staff Communication: reviewed care plan with patient and nursing supervisor  I have spent greater than 50 minutes for this encounter which includes reviewing hospital records, addressing above mentioned concerns, reviewing care plan with patient and hospice nurse, answering patient's concerns and counseling her.   [name], MD Internal Medicine [facility] [address] [place], [place] [address] Cell Phone (Monday-Friday 8 am - 5 pm): [phone] On Call: [phone] and follow prompts after 5 pm and on weekends Office Phone: [phone] Office Fax: [phone]

## 2016-08-15 LAB — BASIC METABOLIC PANEL
BUN: 13 mg/dL (ref 4–21)
Creatinine: 1 mg/dL (ref 0.5–1.1)
Glucose: 88 mg/dL
Potassium: 2.8 mmol/L — AB (ref 3.4–5.3)
Sodium: 143 mmol/L (ref 137–147)

## 2016-08-18 LAB — BASIC METABOLIC PANEL
BUN: 14 mg/dL (ref 4–21)
CREATININE: 1 mg/dL (ref 0.5–1.1)
Glucose: 91 mg/dL
POTASSIUM: 3.7 mmol/L (ref 3.4–5.3)
SODIUM: 137 mmol/L (ref 137–147)

## 2016-09-06 ENCOUNTER — Encounter: Payer: Self-pay | Admitting: Adult Health

## 2016-09-06 ENCOUNTER — Non-Acute Institutional Stay (SKILLED_NURSING_FACILITY): Payer: Medicare Other | Admitting: Adult Health

## 2016-09-06 DIAGNOSIS — I482 Chronic atrial fibrillation, unspecified: Secondary | ICD-10-CM

## 2016-09-06 DIAGNOSIS — G894 Chronic pain syndrome: Secondary | ICD-10-CM | POA: Diagnosis not present

## 2016-09-06 DIAGNOSIS — I5022 Chronic systolic (congestive) heart failure: Secondary | ICD-10-CM

## 2016-09-06 DIAGNOSIS — F341 Dysthymic disorder: Secondary | ICD-10-CM

## 2016-09-06 DIAGNOSIS — E039 Hypothyroidism, unspecified: Secondary | ICD-10-CM

## 2016-09-06 DIAGNOSIS — L89153 Pressure ulcer of sacral region, stage 3: Secondary | ICD-10-CM

## 2016-09-06 DIAGNOSIS — E876 Hypokalemia: Secondary | ICD-10-CM | POA: Diagnosis not present

## 2016-09-06 DIAGNOSIS — F329 Major depressive disorder, single episode, unspecified: Secondary | ICD-10-CM

## 2016-09-06 NOTE — Progress Notes (Addendum)
DATE:  09/06/2016   MRN:  [id number]  BIRTHDAY: 08/25/1940  Facility:  Nursing Home Location:  [name]  Nursing Home Room Number: 306-B  LEVEL OF CARE:  SNF (31)  Contact Information    Name Relation Home Work Mobile   Hawks,Vicky Daughter [phone]  [phone]   Merly, Pistole) Spouse [phone]  [phone]   Clinger,Chris Son   [phone]       Code Status History    Date Active Date Inactive Code Status Order ID Comments User Context   07/26/2016  6:51 AM 07/26/2016  3:57 PM DNR [id number]  Lorretta Harp, MD ED   07/26/2016  4:56 AM 07/26/2016  6:51 AM DNR [id number]  Ward, Layla Maw, DO ED   04/16/2015  3:51 PM 04/22/2015  4:47 PM DNR [id number]  Russella Dar, NP ED   12/14/2014  8:52 PM 12/18/2014  5:40 PM Full Code [id number]  Inez Catalina, MD Inpatient   07/18/2014  1:12 AM 07/19/2014  5:47 PM Full Code [id number]  Lorretta Harp, MD Inpatient   11/27/2013  6:36 PM 12/02/2013  8:03 PM DNR [id number]  Hollice Espy, MD Inpatient   10/03/2013  8:24 PM 10/07/2013  9:06 PM Full Code [id number]  Eduard Clos, MD Inpatient   03/30/2013 11:22 PM 04/02/2013  5:02 PM Full Code [id number]  Therisa Doyne, MD Inpatient   03/24/2012  7:29 PM 03/26/2012  8:33 PM Partial Code [id number]  Cristal Ford, MD ED    Questions for Most Recent Historical Code Status (Order [id number])    Question Answer Comment   In the event of cardiac or respiratory ARREST Do not call a "code blue"    In the event of cardiac or respiratory ARREST Do not perform Intubation, CPR, defibrillation or ACLS    In the event of cardiac or respiratory ARREST Use medication by any route, position, wound care, and other measures to relive pain and suffering. May use oxygen, suction and manual treatment of airway obstruction as needed for comfort.         Advance Directive Documentation     Most Recent Value  Type of Advance Directive  Out of facility DNR (pink MOST or yellow form)    Pre-existing out of facility DNR order (yellow form or pink MOST form)  -  "MOST" Form in Place?  -       Chief Complaint  Patient presents with  . Medical Management of Chronic Issues    HISTORY OF PRESENT ILLNESS:  This is a 75-YO female seen for a routine visit.  She is a long-term care resident of [facility].  She is also being followed by [facility] for hospice care. She was seen in the room and verbalized that she is comfortable @ this time.  She was re-admitted to [facility] on 08/04/2016 for long-term care, after a short stay at [facility]. She was transferred from emergency room to [facility] with acute renal failure, complicated with hyperkalemia, acute hepatitis, diarrhea, hypothermia and leukocytosis unknown etiology. Sepsis was suspected. She, also, has systolic heart failure and non-ischemic cardiomyopathy. The family requested for comfort care. Her AICD has been deactivated.    PAST MEDICAL HISTORY:  Past Medical History:  Diagnosis Date  . Achalasia    s/p dilation  . Acute blood loss anemia   . Allergic rhinitis   . Anoxic brain injury (HCC) 1996   s/p cardiac arrest; "in a coma for  16 days"  . Atrial fibrillation (HCC)    amiodarone  . Automatic implantable cardioverter-defibrillator in situ   . Cardiac arrest - ventricular fibrillation    in 1990s  . Cellulitis of right foot   . Chronic kidney disease (CKD), stage IV (severe) (HCC)    [name] 10/03/2013  . Constipation   . Diuretic-induced hypokalemia   . HLD (hyperlipidemia)   . HTN (hypertension)   . Hypothyroidism   . Insomnia   . Lewy body dementia without behavioral disturbance   . Long term (current) use of anticoagulants   . Major depression, chronic   . NICM (nonischemic cardiomyopathy) (HCC)    EF 35%; cath 2/12 no CAD  . Obesity   . Overactive bladder   . Physical deconditioning   . Protein calorie malnutrition (HCC)   . Rectal bleeding   .  Recurrent UTI   . Severe mitral regurgitation    s/p complex mitral valve repair with cox maze + LAA clipping, removal of RV lead, and implantation of CRT-D in abdomen  . Systolic CHF, chronic (HCC)   . TIA (transient ischemic attack)   . Vaginal bleeding      CURRENT MEDICATIONS: Reviewed  Patient's Medications  New Prescriptions   No medications on file  Previous Medications   LEVOTHYROXINE (SYNTHROID, LEVOTHROID) 50 MCG TABLET    Take 50 mcg by mouth daily before breakfast.   METOPROLOL TARTRATE (LOPRESSOR) 25 MG TABLET    Take 12.5 mg by mouth 2 (two) times daily.    MIRABEGRON ER (MYRBETRIQ) 25 MG TB24 TABLET    Take 25 mg by mouth daily.    MORPHINE 20 MG/5ML SOLUTION    Take 5-10 mg by mouth every 4 (four) hours as needed for pain.   POTASSIUM CHLORIDE (KCL-40 PO)    Take 40 mEq by mouth daily.   TORSEMIDE (DEMADEX) 20 MG TABLET    Take 40 mg by mouth daily.   VENLAFAXINE (EFFEXOR) 75 MG TABLET    Take 75 mg by mouth daily.  Modified Medications   No medications on file  Discontinued Medications   FUROSEMIDE (LASIX) 20 MG TABLET    Take 20 mg by mouth daily.     No Known Allergies   REVIEW OF SYSTEMS:  GENERAL: no  fever or chills  MOUTH and THROAT: Denies oral discomfort, gingival pain or bleeding, pain from teeth or hoarseness   RESPIRATORY: no cough, SOB, DOE, wheezing, hemoptysis CARDIAC: no chest pain,  or palpitations GI: no abdominal pain, diarrhea, constipation, heart burn, nausea or vomiting GU: Denies dysuria, frequency, hematuria, incontinence, or discharge PSYCHIATRIC: Denies feeling of depression or anxiety. No report of hallucinations, insomnia, paranoia, or agitation    PHYSICAL EXAMINATION  GENERAL APPEARANCE:  In no acute distress SKIN:  Has sacrum pressure ulcer, stage 3 covered with dressing, BLE has wraps (kerlix and ACE wraps) HEAD: Normal in size and contour. No evidence of trauma EYES: Lids open and close normally. No blepharitis,  entropion or ectropion. PERRL. Conjunctivae are clear and sclerae are white. Lenses are without opacity EARS: Pinnae are normal. Patient hears normal voice tunes of the examiner MOUTH and THROAT: Lips are without lesions. Oral mucosa is moist and without lesions. Tongue is normal in shape, size, and color and without lesions NECK: supple, trachea midline, no neck masses, no thyroid tenderness, no thyromegaly LYMPHATICS: no LAN in the neck, no supraclavicular LAN RESPIRATORY: breathing is even & unlabored, BS CTAB CARDIAC: irregularly irregular, no murmur,no extra heart  sounds, no edema GI: abdomen soft, normal BS, no masses, no tenderness, no hepatomegaly, no splenomegaly GU:  Has foley catheter draining to urine bag EXTREMITIES:  Able to move X 4 extremities;  BLE has generalized weakness PSYCHIATRIC: Alert to self and place, disoriented to time. Affect and behavior are appropriate   LABS/RADIOLOGY: Labs reviewed: Basic Metabolic Panel:  Recent Labs  16/10/96 0412 07/26/16 0439 07/26/16 0450 08/15/16 08/18/16  NA 139 139  --  143 137  K >7.5* 7.9*  --  2.8* 3.7  CL 106 112*  --   --   --   CO2 11*  --   --   --   --   GLUCOSE 53* 53*  --   --   --   BUN 31* 37*  --  13 14  CREATININE 2.74* 2.70*  --  1.0 1.0  CALCIUM 9.5  --   --   --   --   MG  --   --  2.5*  --   --    Liver Function Tests:  Recent Labs  11/13/15 07/26/16 0412  AST 18 6,985*  ALT 11 2,235*  ALKPHOS 87 222*  BILITOT  --  3.8*  PROT  --  7.9  ALBUMIN  --  3.4*   CBC:  Recent Labs  05/27/16 06/01/16 07/26/16 0412 07/26/16 0439  WBC 5.4 5.1 14.8*  --   NEUTROABS 3 3 12.3*  --   HGB 11.7* 11.6* 12.8 14.6  HCT 38 38 43.4 43.0  MCV  --   --  99.3  --   PLT 182 214 207  --     ASSESSMENT/PLAN:   Chronic atrial fibrillation - rate-controlled; continue metoprolol tartrate 25 mg 1/2 tab = 12.5 mg by mouth twice a day  Chronic systolic heart failure - no SOB was noted, Lasix was recently  discontinued and started on Torsemide 20 mg 2 tabs = 40 mg PO QD  Sacral pressure ulcer, stage 3 - continue wound treatment daily, air mattress, turn to sides, keep skin clean and dry  Chronic pain -  continueMorphine sulfate 20 mg/ML 5-10 mg sublingual every 4 hours when necessary for pain  Hypothyroidism - continue Synthroid 50 g 1 tab by mouth daily  Major depression - mood is stable; continue Effexor XR 75 mg 1 capsule by mouth daily  Hypokalemia - continue KCl ER 40 MDQ by mouth daily     Goals of care:  Long-term care, Hospice/comfort care    Lyann Hagstrom C. Medina-Vargas - NP    [employer] [phone]

## 2016-09-23 DEATH — deceased
